# Patient Record
Sex: Female | Born: 1945
Health system: Southern US, Community
[De-identification: ages and names within clinical notes are randomized; demographics above are authoritative.]

## PROBLEM LIST (undated history)

## (undated) ENCOUNTER — Ambulatory Visit (HOSPITAL_COMMUNITY)

## (undated) DIAGNOSIS — Z8719 Personal history of other diseases of the digestive system: Secondary | ICD-10-CM

## (undated) DIAGNOSIS — K5790 Diverticulosis of intestine, part unspecified, without perforation or abscess without bleeding: Secondary | ICD-10-CM

## (undated) DIAGNOSIS — I341 Nonrheumatic mitral (valve) prolapse: Secondary | ICD-10-CM

## (undated) DIAGNOSIS — D126 Benign neoplasm of colon, unspecified: Secondary | ICD-10-CM

## (undated) DIAGNOSIS — I10 Essential (primary) hypertension: Secondary | ICD-10-CM

## (undated) DIAGNOSIS — C4442 Squamous cell carcinoma of skin of scalp and neck: Secondary | ICD-10-CM

## (undated) DIAGNOSIS — K219 Gastro-esophageal reflux disease without esophagitis: Secondary | ICD-10-CM

## (undated) DIAGNOSIS — E162 Hypoglycemia, unspecified: Secondary | ICD-10-CM

## (undated) DIAGNOSIS — J8489 Other specified interstitial pulmonary diseases: Secondary | ICD-10-CM

## (undated) DIAGNOSIS — Z982 Presence of cerebrospinal fluid drainage device: Secondary | ICD-10-CM

## (undated) DIAGNOSIS — IMO0001 Reserved for inherently not codable concepts without codable children: Secondary | ICD-10-CM

## (undated) DIAGNOSIS — M25551 Pain in right hip: Secondary | ICD-10-CM

## (undated) DIAGNOSIS — K449 Diaphragmatic hernia without obstruction or gangrene: Secondary | ICD-10-CM

## (undated) DIAGNOSIS — D47Z2 Castleman disease: Secondary | ICD-10-CM

## (undated) DIAGNOSIS — Z8679 Personal history of other diseases of the circulatory system: Secondary | ICD-10-CM

## (undated) DIAGNOSIS — K648 Other hemorrhoids: Secondary | ICD-10-CM

## (undated) DIAGNOSIS — I2510 Atherosclerotic heart disease of native coronary artery without angina pectoris: Secondary | ICD-10-CM

## (undated) DIAGNOSIS — G5601 Carpal tunnel syndrome, right upper limb: Secondary | ICD-10-CM

## (undated) DIAGNOSIS — I609 Nontraumatic subarachnoid hemorrhage, unspecified: Secondary | ICD-10-CM

## (undated) DIAGNOSIS — E785 Hyperlipidemia, unspecified: Secondary | ICD-10-CM

## (undated) DIAGNOSIS — R011 Cardiac murmur, unspecified: Secondary | ICD-10-CM

## (undated) DIAGNOSIS — K224 Dyskinesia of esophagus: Secondary | ICD-10-CM

## (undated) DIAGNOSIS — J9859 Other diseases of mediastinum, not elsewhere classified: Secondary | ICD-10-CM

## (undated) HISTORY — DX: Squamous cell carcinoma of skin of scalp and neck: C44.42

## (undated) HISTORY — DX: Benign neoplasm of colon, unspecified: D12.6

## (undated) HISTORY — DX: Diaphragmatic hernia without obstruction or gangrene: K44.9

## (undated) HISTORY — DX: Dyskinesia of esophagus: K22.4

## (undated) HISTORY — DX: Gastro-esophageal reflux disease without esophagitis: K21.9

## (undated) HISTORY — DX: Hyperlipidemia, unspecified: E78.5

## (undated) HISTORY — DX: Nonrheumatic mitral (valve) prolapse: I34.1

## (undated) HISTORY — DX: Other hemorrhoids: K64.8

## (undated) HISTORY — DX: Diverticulosis of intestine, part unspecified, without perforation or abscess without bleeding: K57.90

## (undated) HISTORY — DX: Essential (primary) hypertension: I10

## (undated) HISTORY — DX: Cardiac murmur, unspecified: R01.1

## (undated) HISTORY — DX: Hypoglycemia, unspecified: E16.2

## (undated) HISTORY — PX: EYE SURGERY: SHX253

---

## 1999-02-07 DIAGNOSIS — Z8711 Personal history of peptic ulcer disease: Secondary | ICD-10-CM

## 1999-02-07 HISTORY — DX: Personal history of peptic ulcer disease: Z87.11

## 1999-02-25 ENCOUNTER — Encounter: Admission: RE | Admit: 1999-02-25 | Discharge: 1999-05-26 | Payer: Self-pay | Admitting: Family Medicine

## 1999-11-23 ENCOUNTER — Other Ambulatory Visit: Admission: RE | Admit: 1999-11-23 | Discharge: 1999-11-23 | Payer: Self-pay | Admitting: Gastroenterology

## 2000-09-26 ENCOUNTER — Other Ambulatory Visit: Admission: RE | Admit: 2000-09-26 | Discharge: 2000-09-26 | Payer: Self-pay | Admitting: Obstetrics and Gynecology

## 2002-02-03 ENCOUNTER — Other Ambulatory Visit: Admission: RE | Admit: 2002-02-03 | Discharge: 2002-02-03 | Payer: Self-pay | Admitting: Obstetrics and Gynecology

## 2002-09-07 HISTORY — PX: OTHER SURGICAL HISTORY: SHX169

## 2002-09-18 ENCOUNTER — Encounter: Payer: Self-pay | Admitting: Emergency Medicine

## 2002-09-18 ENCOUNTER — Encounter: Payer: Self-pay | Admitting: Neurosurgery

## 2002-09-18 ENCOUNTER — Inpatient Hospital Stay (HOSPITAL_COMMUNITY): Admission: EM | Admit: 2002-09-18 | Discharge: 2002-10-14 | Payer: Self-pay | Admitting: Neurosurgery

## 2002-09-19 ENCOUNTER — Encounter: Payer: Self-pay | Admitting: Neurosurgery

## 2002-09-22 ENCOUNTER — Encounter: Payer: Self-pay | Admitting: Neurosurgery

## 2002-09-23 ENCOUNTER — Encounter: Payer: Self-pay | Admitting: Neurosurgery

## 2002-10-02 ENCOUNTER — Encounter: Payer: Self-pay | Admitting: Neurosurgery

## 2002-10-06 ENCOUNTER — Encounter: Payer: Self-pay | Admitting: Neurosurgery

## 2002-10-07 ENCOUNTER — Encounter: Payer: Self-pay | Admitting: Neurological Surgery

## 2002-10-08 ENCOUNTER — Encounter: Payer: Self-pay | Admitting: Neurosurgery

## 2002-10-08 HISTORY — PX: VENTRICULOPERITONEAL SHUNT: SHX204

## 2002-10-10 ENCOUNTER — Encounter: Payer: Self-pay | Admitting: Neurosurgery

## 2002-10-12 ENCOUNTER — Encounter: Payer: Self-pay | Admitting: Neurosurgery

## 2002-11-07 ENCOUNTER — Ambulatory Visit (HOSPITAL_COMMUNITY): Admission: RE | Admit: 2002-11-07 | Discharge: 2002-11-07 | Payer: Self-pay | Admitting: Neurosurgery

## 2002-11-07 ENCOUNTER — Encounter: Payer: Self-pay | Admitting: Neurosurgery

## 2002-11-20 ENCOUNTER — Encounter: Payer: Self-pay | Admitting: Neurosurgery

## 2002-11-20 ENCOUNTER — Ambulatory Visit (HOSPITAL_COMMUNITY): Admission: RE | Admit: 2002-11-20 | Discharge: 2002-11-20 | Payer: Self-pay | Admitting: Neurosurgery

## 2002-12-26 ENCOUNTER — Ambulatory Visit (HOSPITAL_COMMUNITY): Admission: RE | Admit: 2002-12-26 | Discharge: 2002-12-26 | Payer: Self-pay | Admitting: Neurosurgery

## 2003-07-03 ENCOUNTER — Ambulatory Visit (HOSPITAL_COMMUNITY): Admission: RE | Admit: 2003-07-03 | Discharge: 2003-07-03 | Payer: Self-pay | Admitting: Neurosurgery

## 2003-09-10 ENCOUNTER — Other Ambulatory Visit: Admission: RE | Admit: 2003-09-10 | Discharge: 2003-09-10 | Payer: Self-pay | Admitting: Obstetrics and Gynecology

## 2004-10-06 ENCOUNTER — Other Ambulatory Visit: Admission: RE | Admit: 2004-10-06 | Discharge: 2004-10-06 | Payer: Self-pay | Admitting: Obstetrics and Gynecology

## 2005-01-02 ENCOUNTER — Ambulatory Visit: Payer: Self-pay | Admitting: Gastroenterology

## 2005-01-16 ENCOUNTER — Ambulatory Visit: Payer: Self-pay | Admitting: Gastroenterology

## 2005-10-11 ENCOUNTER — Other Ambulatory Visit: Admission: RE | Admit: 2005-10-11 | Discharge: 2005-10-11 | Payer: Self-pay | Admitting: Obstetrics and Gynecology

## 2010-02-06 HISTORY — PX: CATARACT EXTRACTION W/ INTRAOCULAR LENS  IMPLANT, BILATERAL: SHX1307

## 2010-02-22 ENCOUNTER — Encounter: Payer: Self-pay | Admitting: Gastroenterology

## 2010-03-10 NOTE — Letter (Signed)
Summary: Colonoscopy Letter  [facility]  [address]   [place], [place] [address]   Phone: [phone]  Fax: [phone]      February 22, 2010 MRN: [id number]   [facility] [address] [place], [place]  [address]   Dear Ms. Sosa,   According to your medical record, it is time for you to schedule a Colonoscopy. The American Cancer Society recommends this procedure as a method to detect early colon cancer. Patients with a family history of colon cancer, or a personal history of colon polyps or inflammatory bowel disease are at increased risk.  This letter has been generated based on the recommendations made at the time of your procedure. If you feel that in your particular situation this may no longer apply, please contact our office.  Please call our office at [phone] to schedule this appointment or to update your records at your earliest convenience.  Thank you for cooperating with [place] to provide you with the very best care possible.   Sincerely,   Vania Rea. Jarold Motto, M.D.  [facility] Gastroenterology Division [phone]

## 2010-05-04 ENCOUNTER — Encounter (INDEPENDENT_AMBULATORY_CARE_PROVIDER_SITE_OTHER): Payer: Self-pay | Admitting: *Deleted

## 2010-05-10 NOTE — Letter (Signed)
Summary: Pre Visit Letter Revised  [facility]  [address] [place], [place] [address]   Phone: [phone]  Fax: [phone]        05/04/2010 MRN: [id number] [facility] [address] [place], [place]  [address]             Procedure Date:  06-27-10           Recall Colon---Dr. Jarold Motto   Welcome to the Gastroenterology Division at [facility].    You are scheduled to see a nurse for your pre-procedure visit on 06-13-10 at 10:00a.m. on the 3rd floor at [facility], [address]  We ask that you try to arrive at our office 15 minutes prior to your appointment time to allow for check-in.  Please take a minute to review the attached form.  If you answer "Yes" to one or more of the questions on the first page, we ask that you call the person listed at your earliest opportunity.  If you answer "No" to all of the questions, please complete the rest of the form and bring it to your appointment.    Your nurse visit will consist of discussing your medical and surgical history, your immediate family medical history, and your medications.   If you are unable to list all of your medications on the form, please bring the medication bottles to your appointment and we will list them.  We will need to be aware of both prescribed and over the counter drugs.  We will need to know exact dosage information as well.    Please be prepared to read and sign documents such as consent forms, a financial agreement, and acknowledgement forms.  If necessary, and with your consent, a friend or relative is welcome to sit-in on the nurse visit with you.  Please bring your insurance card so that we may make a copy of it.  If your insurance requires a referral to see a specialist, please bring your referral form from your primary care physician.  No co-pay is required for this nurse visit.     If you cannot keep your appointment, please call [phone] to cancel or reschedule prior  to your appointment date.  This allows [place] the opportunity to schedule an appointment for another patient in need of care.    Thank you for choosing [facility] for your medical needs.  We appreciate the opportunity to care for you.  Please visit [place] at our website  to learn more about our practice.  Sincerely, The Gastroenterology Division

## 2010-06-13 ENCOUNTER — Encounter: Payer: Self-pay | Admitting: Gastroenterology

## 2010-06-13 ENCOUNTER — Ambulatory Visit (AMBULATORY_SURGERY_CENTER): Payer: Medicare Other | Admitting: *Deleted

## 2010-06-13 VITALS — Ht 65.0 in | Wt 193.8 lb

## 2010-06-13 DIAGNOSIS — Z800 Family history of malignant neoplasm of digestive organs: Secondary | ICD-10-CM

## 2010-06-13 MED ORDER — PEG-KCL-NACL-NASULF-NA ASC-C 100 G PO SOLR: ORAL | Status: DC

## 2010-06-20 ENCOUNTER — Other Ambulatory Visit: Payer: Self-pay | Admitting: Gastroenterology

## 2010-06-27 ENCOUNTER — Ambulatory Visit (AMBULATORY_SURGERY_CENTER): Payer: Medicare Other | Admitting: Gastroenterology

## 2010-06-27 ENCOUNTER — Encounter: Payer: Self-pay | Admitting: Gastroenterology

## 2010-06-27 ENCOUNTER — Encounter: Payer: Self-pay | Admitting: *Deleted

## 2010-06-27 VITALS — BP 147/74 | HR 60 | Temp 99.2°F | Resp 17 | Ht 65.0 in | Wt 193.0 lb

## 2010-06-27 DIAGNOSIS — Z800 Family history of malignant neoplasm of digestive organs: Secondary | ICD-10-CM

## 2010-06-27 DIAGNOSIS — Z1211 Encounter for screening for malignant neoplasm of colon: Secondary | ICD-10-CM

## 2010-06-27 MED ORDER — SODIUM CHLORIDE 0.9 % IV SOLN: 500.0000 mL | INTRAVENOUS | Status: DC

## 2010-06-27 NOTE — Patient Instructions (Signed)
Discharged instructions given with verbal understanding. Normal examination. Resume previous medications. 

## 2010-06-28 ENCOUNTER — Telehealth: Payer: Self-pay

## 2010-06-28 NOTE — Telephone Encounter (Signed)

## 2011-03-10 HISTORY — PX: RETINAL DETACHMENT REPAIR W/ SCLERAL BUCKLE LE: SHX2338

## 2011-07-10 ENCOUNTER — Other Ambulatory Visit: Payer: Self-pay | Admitting: Orthopedic Surgery

## 2011-07-19 ENCOUNTER — Encounter (HOSPITAL_BASED_OUTPATIENT_CLINIC_OR_DEPARTMENT_OTHER): Payer: Self-pay | Admitting: *Deleted

## 2011-07-20 ENCOUNTER — Encounter (HOSPITAL_BASED_OUTPATIENT_CLINIC_OR_DEPARTMENT_OTHER): Payer: Self-pay | Admitting: *Deleted

## 2011-07-20 NOTE — Progress Notes (Signed)
NPO AFTER MN. ARRIVES AT 0715. NEEDS ISTAT AND EKG. WILL TAKE AM MEDS W/ EXCEPTION NO HCTZ, W/ SIPS OF WATER.

## 2011-07-20 NOTE — Progress Notes (Signed)
NPO AFTER MN. ARRIVES AT 0715. NEEDS ISTAT AND EKG. WILL TAKE AM MEDS WITH EXCEPTION NO HCTZ, W/ SIP OF WATER.

## 2011-07-25 NOTE — Anesthesia Preprocedure Evaluation (Addendum)
Anesthesia Evaluation  Patient identified by MRN, date of birth, ID band Patient awake  General Assessment Comment:Hypertension   Hyperlipidemia      GERD (gastroesophageal reflux disease)   Hypoglycemia      Heart murmur   MVP (mitral valve prolapse)      Diverticulosis   History of gastric ulcer 2001    Carpal tunnel syndrome of right wrist   H/O hiatal hernia      S/P ventriculoperitoneal shunt 2004--- PER PT SHUNT IS WORKING WELL --  NO ISSUES Normal echocardiogram 30 YRS AGO    Hip pain, right OCCASIONAL -- PINCHED NERVE H/O intraventricular hemorrhage 2004--     Reviewed: Allergy & Precautions, H&P , NPO status , Patient's Chart, lab work & pertinent test results  Airway Mallampati: II TM Distance: >3 FB Neck ROM: Full    Dental No notable dental hx.    Pulmonary neg pulmonary ROS,  breath sounds clear to auscultation  Pulmonary exam normal       Cardiovascular hypertension, Pt. on home beta blockers + Valvular Problems/Murmurs Rhythm:Regular Rate:Normal     Neuro/Psych  Neuromuscular disease negative neurological ROS  negative psych ROS   GI/Hepatic Neg liver ROS, hiatal hernia, GERD-  Medicated,  Endo/Other  negative endocrine ROS  Renal/GU negative Renal ROS  negative genitourinary   Musculoskeletal negative musculoskeletal ROS (+)   Abdominal (+) + obese,   Peds negative pediatric ROS (+)  Hematology negative hematology ROS (+)   Anesthesia Other Findings   Reproductive/Obstetrics negative OB ROS                          Anesthesia Physical Anesthesia Plan  ASA: II  Anesthesia Plan: General   Post-op Pain Management:    Induction: Intravenous  Airway Management Planned: LMA  Additional Equipment:   Intra-op Plan:   Post-operative Plan: Extubation in OR  Informed Consent: I have reviewed the patients History and Physical, chart, labs and discussed the procedure  including the risks, benefits and alternatives for the proposed anesthesia with the patient or authorized representative who has indicated his/her understanding and acceptance.   Dental advisory given  Plan Discussed with: CRNA  Anesthesia Plan Comments:         Anesthesia Quick Evaluation

## 2011-07-26 ENCOUNTER — Encounter (HOSPITAL_BASED_OUTPATIENT_CLINIC_OR_DEPARTMENT_OTHER): Payer: Self-pay | Admitting: *Deleted

## 2011-07-26 ENCOUNTER — Encounter (HOSPITAL_BASED_OUTPATIENT_CLINIC_OR_DEPARTMENT_OTHER)
Admission: RE | Disposition: A | Discharge status: Home / Self Care | Payer: Self-pay | Source: Ambulatory Visit | Attending: Orthopedic Surgery

## 2011-07-26 ENCOUNTER — Ambulatory Visit (HOSPITAL_BASED_OUTPATIENT_CLINIC_OR_DEPARTMENT_OTHER)
Admission: RE | Admit: 2011-07-26 | Discharge: 2011-07-26 | Disposition: A | Discharge status: Home / Self Care | Payer: Medicare Other | Source: Ambulatory Visit | Attending: Orthopedic Surgery | Admitting: Orthopedic Surgery

## 2011-07-26 ENCOUNTER — Encounter (HOSPITAL_BASED_OUTPATIENT_CLINIC_OR_DEPARTMENT_OTHER): Payer: Self-pay | Admitting: Anesthesiology

## 2011-07-26 ENCOUNTER — Ambulatory Visit (HOSPITAL_BASED_OUTPATIENT_CLINIC_OR_DEPARTMENT_OTHER): Payer: Medicare Other | Admitting: Anesthesiology

## 2011-07-26 DIAGNOSIS — M720 Palmar fascial fibromatosis [Dupuytren]: Secondary | ICD-10-CM | POA: Insufficient documentation

## 2011-07-26 DIAGNOSIS — Z9889 Other specified postprocedural states: Secondary | ICD-10-CM

## 2011-07-26 DIAGNOSIS — K219 Gastro-esophageal reflux disease without esophagitis: Secondary | ICD-10-CM | POA: Insufficient documentation

## 2011-07-26 DIAGNOSIS — Z79899 Other long term (current) drug therapy: Secondary | ICD-10-CM | POA: Insufficient documentation

## 2011-07-26 DIAGNOSIS — G5600 Carpal tunnel syndrome, unspecified upper limb: Secondary | ICD-10-CM | POA: Insufficient documentation

## 2011-07-26 DIAGNOSIS — E785 Hyperlipidemia, unspecified: Secondary | ICD-10-CM | POA: Insufficient documentation

## 2011-07-26 DIAGNOSIS — I10 Essential (primary) hypertension: Secondary | ICD-10-CM | POA: Insufficient documentation

## 2011-07-26 HISTORY — PX: DUPUYTREN CONTRACTURE RELEASE: SHX1478

## 2011-07-26 HISTORY — DX: Personal history of other diseases of the digestive system: Z87.19

## 2011-07-26 HISTORY — PX: CARPAL TUNNEL RELEASE: SHX101

## 2011-07-26 HISTORY — DX: Personal history of other diseases of the circulatory system: Z86.79

## 2011-07-26 HISTORY — DX: Carpal tunnel syndrome, right upper limb: G56.01

## 2011-07-26 HISTORY — DX: Reserved for inherently not codable concepts without codable children: IMO0001

## 2011-07-26 HISTORY — DX: Presence of cerebrospinal fluid drainage device: Z98.2

## 2011-07-26 HISTORY — DX: Pain in right hip: M25.551

## 2011-07-26 LAB — POCT I-STAT 4, (NA,K, GLUC, HGB,HCT)
Glucose, Bld: 108 mg/dL — ABNORMAL HIGH (ref 70–99)
HCT: 61 % — ABNORMAL HIGH (ref 36.0–46.0)
Hemoglobin: 20.7 g/dL — ABNORMAL HIGH (ref 12.0–15.0)
Potassium: 3.7 mEq/L (ref 3.5–5.1)
Sodium: 146 mEq/L — ABNORMAL HIGH (ref 135–145)

## 2011-07-26 SURGERY — CARPAL TUNNEL RELEASE
Anesthesia: General | Site: Hand | Laterality: Right | Wound class: Clean

## 2011-07-26 MED ORDER — LIDOCAINE HCL (CARDIAC) 20 MG/ML IV SOLN
INTRAVENOUS | Status: DC | PRN
  Administered 2011-07-26: 70 mg via INTRAVENOUS

## 2011-07-26 MED ORDER — HYDROCODONE-ACETAMINOPHEN 5-325 MG PO TABS
1.0000 | ORAL_TABLET | Freq: Four times a day (QID) | ORAL | Status: DC | PRN
  Administered 2011-07-26: 1 via ORAL

## 2011-07-26 MED ORDER — METOCLOPRAMIDE HCL 5 MG/ML IJ SOLN
INTRAMUSCULAR | Status: DC | PRN
  Administered 2011-07-26: 10 mg via INTRAVENOUS

## 2011-07-26 MED ORDER — FENTANYL CITRATE 0.05 MG/ML IJ SOLN
INTRAMUSCULAR | Status: DC | PRN
  Administered 2011-07-26 (×2): 25 ug via INTRAVENOUS
  Administered 2011-07-26 (×2): 50 ug via INTRAVENOUS
  Administered 2011-07-26 (×2): 25 ug via INTRAVENOUS

## 2011-07-26 MED ORDER — ONDANSETRON HCL 4 MG/2ML IJ SOLN
INTRAMUSCULAR | Status: DC | PRN
  Administered 2011-07-26: 4 mg via INTRAVENOUS

## 2011-07-26 MED ORDER — KETOROLAC TROMETHAMINE 30 MG/ML IJ SOLN
INTRAMUSCULAR | Status: DC | PRN
  Administered 2011-07-26: 15 mg via INTRAVENOUS

## 2011-07-26 MED ORDER — MIDAZOLAM HCL 5 MG/5ML IJ SOLN
INTRAMUSCULAR | Status: DC | PRN
  Administered 2011-07-26: 2 mg via INTRAVENOUS

## 2011-07-26 MED ORDER — PROPOFOL 10 MG/ML IV EMUL
INTRAVENOUS | Status: DC | PRN
  Administered 2011-07-26: 150 mg via INTRAVENOUS
  Administered 2011-07-26: 30 mg via INTRAVENOUS

## 2011-07-26 MED ORDER — LACTATED RINGERS IV SOLN
INTRAVENOUS | Status: DC
  Administered 2011-07-26 (×2): via INTRAVENOUS

## 2011-07-26 MED ORDER — PROMETHAZINE HCL 25 MG/ML IJ SOLN: 6.2500 mg | INTRAMUSCULAR | Status: DC | PRN

## 2011-07-26 MED ORDER — HYDROCODONE-ACETAMINOPHEN 5-325 MG PO TABS: 1.0000 | ORAL_TABLET | Freq: Four times a day (QID) | ORAL | Status: AC | PRN

## 2011-07-26 MED ORDER — FENTANYL CITRATE 0.05 MG/ML IJ SOLN
25.0000 ug | INTRAMUSCULAR | Status: DC | PRN
  Administered 2011-07-26 (×2): 25 ug via INTRAVENOUS

## 2011-07-26 MED ORDER — POVIDONE-IODINE 7.5 % EX SOLN: Freq: Once | CUTANEOUS | Status: DC

## 2011-07-26 SURGICAL SUPPLY — 61 items
BANDAGE COBAN STERILE 2 (GAUZE/BANDAGES/DRESSINGS) IMPLANT
BANDAGE CONFORM 2  STR LF (GAUZE/BANDAGES/DRESSINGS) IMPLANT
BANDAGE CONFORM 3  STR LF (GAUZE/BANDAGES/DRESSINGS) ×2 IMPLANT
BANDAGE ELASTIC 3 VELCRO ST LF (GAUZE/BANDAGES/DRESSINGS) IMPLANT
BANDAGE ELASTIC 4 VELCRO ST LF (GAUZE/BANDAGES/DRESSINGS) ×1 IMPLANT
BLADE SURG 15 STRL LF DISP TIS (BLADE) ×1 IMPLANT
BLADE SURG 15 STRL SS (BLADE) ×2
BNDG CMPR 9X4 STRL LF SNTH (GAUZE/BANDAGES/DRESSINGS) ×1
BNDG ESMARK 4X9 LF (GAUZE/BANDAGES/DRESSINGS) ×2 IMPLANT
CLOTH BEACON ORANGE TIMEOUT ST (SAFETY) ×2 IMPLANT
CORDS BIPOLAR (ELECTRODE) ×2 IMPLANT
COVER TABLE BACK 60X90 (DRAPES) ×2 IMPLANT
DRAPE EXTREMITY T 121X128X90 (DRAPE) ×2 IMPLANT
DRAPE LG THREE QUARTER DISP (DRAPES) ×4 IMPLANT
DRAPE U-SHAPE 47X51 STRL (DRAPES) ×2 IMPLANT
DRSG EMULSION OIL 3X3 NADH (GAUZE/BANDAGES/DRESSINGS) ×2 IMPLANT
DURAPREP 26ML APPLICATOR (WOUND CARE) ×2 IMPLANT
ELECT REM PT RETURN 9FT ADLT (ELECTROSURGICAL) ×2
ELECTRODE REM PT RTRN 9FT ADLT (ELECTROSURGICAL) IMPLANT
GLOVE BIOGEL PI IND STRL 8 (GLOVE) ×1 IMPLANT
GLOVE BIOGEL PI INDICATOR 8 (GLOVE) ×1
GLOVE ECLIPSE 6.5 STRL STRAW (GLOVE) ×1 IMPLANT
GLOVE ECLIPSE 8.0 STRL XLNG CF (GLOVE) ×2 IMPLANT
GLOVE INDICATOR 6.5 STRL GRN (GLOVE) ×1 IMPLANT
GLOVE INDICATOR 8.0 STRL GRN (GLOVE) ×4 IMPLANT
GOWN PREVENTION PLUS LG XLONG (DISPOSABLE) ×2 IMPLANT
GOWN STRL REIN XL XLG (GOWN DISPOSABLE) ×2 IMPLANT
GOWN W/COTTON TOWEL STD LRG (GOWNS) ×3 IMPLANT
GOWN XL W/COTTON TOWEL STD (GOWNS) ×2 IMPLANT
MARKER SKIN DUAL TIP RULER LAB (MISCELLANEOUS) ×2 IMPLANT
NDL HYPO 25X1 1.5 SAFETY (NEEDLE) IMPLANT
NEEDLE 27GAX1X1/2 (NEEDLE) IMPLANT
NEEDLE HYPO 22GX1.5 SAFETY (NEEDLE) IMPLANT
NEEDLE HYPO 25X1 1.5 SAFETY (NEEDLE) IMPLANT
NS IRRIG 500ML POUR BTL (IV SOLUTION) ×3 IMPLANT
PACK BASIN DAY SURGERY FS (CUSTOM PROCEDURE TRAY) ×2 IMPLANT
PAD CAST 3X4 CTTN HI CHSV (CAST SUPPLIES) IMPLANT
PAD CAST 4YDX4 CTTN HI CHSV (CAST SUPPLIES) IMPLANT
PADDING CAST ABS 4INX4YD NS (CAST SUPPLIES) ×1
PADDING CAST ABS COTTON 4X4 ST (CAST SUPPLIES) ×1 IMPLANT
PADDING CAST COTTON 3X4 STRL (CAST SUPPLIES)
PADDING CAST COTTON 4X4 STRL (CAST SUPPLIES)
PENCIL BUTTON HOLSTER BLD 10FT (ELECTRODE) IMPLANT
SPLINT PLASTER CAST XFAST 3X15 (CAST SUPPLIES) IMPLANT
SPLINT PLASTER XTRA FASTSET 3X (CAST SUPPLIES) ×1
SPONGE GAUZE 2X2 8PLY STRL LF (GAUZE/BANDAGES/DRESSINGS) IMPLANT
SPONGE GAUZE 4X4 12PLY (GAUZE/BANDAGES/DRESSINGS) ×2 IMPLANT
STOCKINETTE 4X48 STRL (DRAPES) ×2 IMPLANT
SUT ETHILON 4 0 PS 2 18 (SUTURE) ×6 IMPLANT
SUT ETHILON 5 0 PS 2 18 (SUTURE) IMPLANT
SUT VIC AB 2-0 SH 27 (SUTURE)
SUT VIC AB 2-0 SH 27XBRD (SUTURE) IMPLANT
SUT VIC AB 2-0 UR6 27 (SUTURE) IMPLANT
SUT VIC AB 4-0 SH 27 (SUTURE)
SUT VIC AB 4-0 SH 27XANBCTRL (SUTURE) IMPLANT
SYR BULB 3OZ (MISCELLANEOUS) ×2 IMPLANT
SYR CONTROL 10ML LL (SYRINGE) IMPLANT
TOWEL NATURAL 6PK STERILE (DISPOSABLE) ×1 IMPLANT
TOWEL OR 17X24 6PK STRL BLUE (TOWEL DISPOSABLE) ×4 IMPLANT
UNDERPAD 30X30 INCONTINENT (UNDERPADS AND DIAPERS) ×2 IMPLANT
WATER STERILE IRR 500ML POUR (IV SOLUTION) ×1 IMPLANT

## 2011-07-26 NOTE — H&P (Signed)
Alison Ayala is an 66 y.o. female.   Chief Complaint:pain and numbness rt hand HPI: progressive contracture thumb/index web space due to Dupuytren's band and median nerve compression on NCV's  Past Medical History  Diagnosis Date  . Hypertension   . Hyperlipidemia   . GERD (gastroesophageal reflux disease)   . Hypoglycemia   . Heart murmur   . MVP (mitral valve prolapse)   . Diverticulosis   . History of gastric ulcer 2001  . Carpal tunnel syndrome of right wrist   . H/O hiatal hernia   . S/P ventriculoperitoneal shunt 2004--- PER PT SHUNT IS WORKING WELL --  NO ISSUES  . Normal echocardiogram 30 YRS AGO  . Hip pain, right OCCASIONAL -- PINCHED NERVE  . H/O intraventricular hemorrhage 2004--  SPONTANEOUS    S/P SHUNT    Past Surgical History  Procedure Date  . Ventriculoperitoneal shunt 10-08-2002    RIGHT FRONTAL FOR COMMUNICATING HYDROCEPHALUS  . Bilateral frontal interventricular catheter via twist drill hole AUG 2004    SPONTANEOUS SUBARACHNOID  HEMORRHAGE AND  INTRAVENTRICULAR HEMORRHAGE  . Cataract extraction w/ intraocular lens  implant, bilateral 2012    BILATERAL  . Retinal detachment repair w/ scleral buckle le 03-10-2011    LEFT EYE    Family History  Problem Relation Age of Onset  . Colon cancer Mother 64  . Stomach cancer Maternal Aunt   . Breast cancer Maternal Aunt   . Uterine cancer Maternal Aunt    Social History:  reports that she has never smoked. She has never used smokeless tobacco. She reports that she drinks alcohol. She reports that she does not use illicit drugs.  Allergies: No Known Allergies  Medications Prior to Admission  Medication Sig Dispense Refill  . atenolol (TENORMIN) 100 MG tablet Take 100 mg by mouth every morning.       . Cholecalciferol (VITAMIN D) 1000 UNITS capsule Take 1,000 Units by mouth every morning.       . hydrochlorothiazide 25 MG tablet Take 25 mg by mouth every morning. Takes 1/2 tablet,may take 1 tablet if  needed      . ibuprofen (ADVIL,MOTRIN) 200 MG tablet Take 200 mg by mouth every 6 (six) hours as needed.      . pravastatin (PRAVACHOL) 40 MG tablet Take 80 mg by mouth every morning.       . ranitidine (ZANTAC) 150 MG tablet Take 150 mg by mouth every morning.        No results found for this or any previous visit (from the past 48 hour(s)). No results found.  ROS  Blood pressure 143/70, pulse 54, temperature 97.4 F (36.3 C), temperature source Oral, resp. rate 18, height 5\' 5"  (1.651 m), weight 88.451 kg (195 lb), SpO2 95.00%. Physical Exam  Constitutional: She is oriented to person, place, and time. She appears well-developed and well-nourished.  HENT:  Head: Normocephalic and atraumatic.  Right Ear: External ear normal.  Left Ear: External ear normal.  Nose: Nose normal.  Mouth/Throat: Oropharynx is clear and moist.  Eyes: Conjunctivae and EOM are normal. Pupils are equal, round, and reactive to light.  Neck: Normal range of motion. Neck supple.  Cardiovascular: Normal rate, regular rhythm, normal heart sounds and intact distal pulses.   Respiratory: Effort normal and breath sounds normal.  GI: Soft. Bowel sounds are normal.  Musculoskeletal: Normal range of motion.  Neurological: She is alert and oriented to person, place, and time. She has normal reflexes.  Skin: Skin is  warm and dry.  Psychiatric: She has a normal mood and affect. Her behavior is normal. Judgment and thought content normal.     Assessment/Plan 1Dupuytren's band thumb index web space  2.carpal tunnel syndrome  Rt hand Correction of both    Alison Ayala P 07/26/2011, 8:17 AM

## 2011-07-26 NOTE — Discharge Instructions (Addendum)
  Post Anesthesia Home Care Instructions  Activity: Get plenty of rest for the remainder of the day. A responsible adult should stay with you for 24 hours following the procedure.  For the next 24 hours, DO NOT: -Drive a car -Advertising copywriter -Drink alcoholic beverages -Take any medication unless instructed by your physician -Make any legal decisions or sign important papers.  Meals: Start with liquid foods such as gelatin or soup. Progress to regular foods as tolerated. Avoid greasy, spicy, heavy foods. If nausea and/or vomiting occur, drink only clear liquids until the nausea and/or vomiting subsides. Call your physician if vomiting continues.  Special Instructions/Symptoms: Your throat may feel dry or sore from the anesthesia or the breathing tube placed in your throat during surgery. If this causes discomfort, gargle with warm salt water. The discomfort should disappear within 24 hours.        HAND SURGERY    HOME CARE INSTRUCTIONS    The following instructions have been prepared to help you care for yourself upon your return home today.  Wound Care:  Keep your hand elevated above the level of your heart. Do not allow it to dangle by your side. Keep the dressing dry and do not remove it unless your doctor advises you to do so. He will usually change it at the time of you post-op visit. Moving your fingers is advised to stimulate circulation but will depend on the site of your surgery. Of course, if you have a splint applied your doctor will advise you about movement.  Activity:  Do not drive or operate machinery today. Rest today and then you may return to your normal activity and work as indicated by your physician.  Diet: Drink liquids today or eat a light diet. You may resume a regular diet tomorrow.  General expectations: Pain for two or three days. Fingers may become slightly swollen.   Unexpected Observations- Call your doctor if any of these occur: Severe pain not  relieved by pain medication. Elevated temperature. Dressing soaked with blood. Inability to move fingers. White or bluish color to fingers.  Return to office: ***

## 2011-07-26 NOTE — Transfer of Care (Signed)
Immediate Anesthesia Transfer of Care Note  Patient: Alison Ayala  Procedure(s) Performed: Procedure(s) (LRB): CARPAL TUNNEL RELEASE (Right) DUPUYTREN CONTRACTURE RELEASE (Right)  Patient Location: PACU  Anesthesia Type: General  Level of Consciousness: awake, alert  and oriented  Airway & Oxygen Therapy: Patient Spontanous Breathing and Patient connected to face mask oxygen  Post-op Assessment: Report given to PACU RN and Post -op Vital signs reviewed and stable  Post vital signs: Reviewed and stable  Complications: No apparent anesthesia complications

## 2011-07-26 NOTE — Anesthesia Procedure Notes (Signed)
Procedure Name: LMA Insertion Date/Time: 07/26/2011 8:33 AM Performed by: Norva Pavlov Pre-anesthesia Checklist: Patient identified, Emergency Drugs available, Suction available and Patient being monitored Patient Re-evaluated:Patient Re-evaluated prior to inductionOxygen Delivery Method: Circle System Utilized Preoxygenation: Pre-oxygenation with 100% oxygen Intubation Type: IV induction Ventilation: Mask ventilation without difficulty LMA: LMA with gastric port inserted LMA Size: 4.0 Number of attempts: 1 Placement Confirmation: positive ETCO2 Tube secured with: Tape Dental Injury: Teeth and Oropharynx as per pre-operative assessment

## 2011-07-26 NOTE — Brief Op Note (Signed)
07/26/2011  1:54 PM  PATIENT:  Cathey Endow  66 y.o. female  PRE-OPERATIVE DIAGNOSIS:  RIGHT HAND CARPAL TUNELL SYNDROME , RIGHT THUMB AND INDEX FINGER DEPUTYRENS  POST-OPERATIVE DIAGNOSIS:  RIGHT HAND CARPAL TUNELL SYNDROME , RIGHT THUMB  PROCEDURE:  Procedure(s) (LRB): CARPAL TUNNEL RELEASE (Right) DUPUYTREN CONTRACTURE RELEASE (Right)  SURGEON:  Surgeon(s) and Role:    * Drucilla Schmidt, MD - Primary  PHYSICIAN ASSISTANT:   ASSISTANTS: nurse  ANESTHESIA:   general  EBL:  Total I/O In: 2180 [P.O.:480; I.V.:1700] Out: -   BLOOD ADMINISTERED:none  DRAINS: none   LOCAL MEDICATIONS USED:  NONE  SPECIMEN:  No Specimen  DISPOSITION OF SPECIMEN:  N/A  COUNTS:  YES  TOURNIQUET:   Total Tourniquet Time Documented: Upper Arm (Right) - 67 minutes  DICTATION: .Other Dictation: Dictation Number [id number]  PLAN OF CARE: Discharge to home after PACU  PATIENT DISPOSITION:  PACU - hemodynamically stable.   Delay start of Pharmacological VTE agent (>24hrs) due to surgical blood loss or risk of bleeding: yes And index

## 2011-07-26 NOTE — Anesthesia Postprocedure Evaluation (Signed)
  Anesthesia Post-op Note  Patient: Alison Ayala  Procedure(s) Performed: Procedure(s) (LRB): CARPAL TUNNEL RELEASE (Right) DUPUYTREN CONTRACTURE RELEASE (Right)  Patient Location: PACU  Anesthesia Type: General  Level of Consciousness: awake and alert   Airway and Oxygen Therapy: Patient Spontanous Breathing  Post-op Pain: mild  Post-op Assessment: Post-op Vital signs reviewed, Patient's Cardiovascular Status Stable, Respiratory Function Stable, Patent Airway and No signs of Nausea or vomiting  Post-op Vital Signs: stable  Complications: No apparent anesthesia complications

## 2011-07-27 ENCOUNTER — Encounter (HOSPITAL_BASED_OUTPATIENT_CLINIC_OR_DEPARTMENT_OTHER): Payer: Self-pay | Admitting: Orthopedic Surgery

## 2011-07-27 NOTE — Op Note (Signed)
NAMECHELLSIE, GOMER             ACCOUNT NO.:  [id number]  MEDICAL RECORD NO.:  [id number]  LOCATION:                               FACILITY:  [facility]  PHYSICIAN:  Marlowe Kays, M.D.  DATE OF BIRTH:  March 30, 1945  DATE OF PROCEDURE:  07/26/2011 DATE OF DISCHARGE:                              OPERATIVE REPORT   PREOPERATIVE DIAGNOSES: 1. Right upper extremity carpal tunnel syndrome. 2. Painful Dupuytren's band from index thumb web space  POSTOPERATIVE DIAGNOSES: The Same OPERATION: 1. Decompression of median nerve, right wrist and hand. 2. Excision of Dupuytren's band, thumb-index webspace with modified Z     plasty.  SURGEON:  Marlowe Kays, M.D.  ASSISTANT:  Nurse.  ANESTHESIA:  General.  PATHOLOGY AND JUSTIFICATION FOR PROCEDURE:  She had_thumb-index restriction________  because her webspace was tight to her thumb and index finger negatively affecting her hand function.  She also had numbness of the median nerve distribution in hand with nerve conduction studies confirmed a carpal tunnel syndrome.  PROCEDURE:  Satisfied general anesthesia, pneumatic tourniquet to right upper extremity with the arm Esmarched out nonsterilely and tourniquet inflated to 250 mmHg.  Time-out performed.  The right upper extremity from midforearm to fingertips was prepped with DuraPrep and draped in sterile field.  I marked out the carpal tunnel incision, and then marked out the Z-plasty type incision involving the thumb-index webspace trying to give [place] much room between this incision and the carpal tunnel incision as possible.  I first performed the carpal tunnel release at the wrist making decisions and she had no palmaris longus tendon.  The median nerve was identified and protecting it.  I have released the skin, subcutaneous tissue, and fascia into the distal palm.  The wound was then irrigated with sterile saline.  Skin and subcutaneous tissue only closed with 4-0 nylon mattress  sutures.  I then opened the Z-plasty incision, taking great care to both visualize and not injure the radial digital nerve to the index finger and the 2 digital nerves to the thumb. The Dupuytren's band was excised, and this allowed thumb-index web space to open nicely.  I then went to reconstruct the Z-plasty and found that because of the location, the flaps did not respond well to a typical Z- plasty and accordingly I used a V-Y plasty closing the apices of the Z- plasty for about 7 mm, which again is 1.4 cm of additional tissue length.  The 2 flaps were closed without any tension with 4-0 nylon mattress sutures with the apical sutures at the apices of the Z-plasty flaps.  Betadine, Adaptic, dry sterile dressing and a pressure-type dressing for the hand webspace with volar plaster splint applied.  Tourniquet was released. She tolerated the procedure well, was taken to recovery room in satisfactory condition with no known complications.          ______________________________ Marlowe Kays, M.D.     JA/MEDQ  D:  07/26/2011  T:  07/27/2011  Job:  [id number]

## 2012-05-31 ENCOUNTER — Other Ambulatory Visit: Payer: Self-pay | Admitting: Dermatology

## 2013-09-12 ENCOUNTER — Encounter: Payer: Self-pay | Admitting: Gastroenterology

## 2014-04-08 DIAGNOSIS — E559 Vitamin D deficiency, unspecified: Secondary | ICD-10-CM | POA: Diagnosis not present

## 2014-04-08 DIAGNOSIS — E785 Hyperlipidemia, unspecified: Secondary | ICD-10-CM | POA: Diagnosis not present

## 2014-04-08 DIAGNOSIS — R7301 Impaired fasting glucose: Secondary | ICD-10-CM | POA: Diagnosis not present

## 2014-04-08 DIAGNOSIS — F325 Major depressive disorder, single episode, in full remission: Secondary | ICD-10-CM | POA: Diagnosis not present

## 2014-04-08 DIAGNOSIS — I10 Essential (primary) hypertension: Secondary | ICD-10-CM | POA: Diagnosis not present

## 2014-04-08 DIAGNOSIS — Z79899 Other long term (current) drug therapy: Secondary | ICD-10-CM | POA: Diagnosis not present

## 2014-04-08 DIAGNOSIS — K219 Gastro-esophageal reflux disease without esophagitis: Secondary | ICD-10-CM | POA: Diagnosis not present

## 2014-04-08 DIAGNOSIS — M810 Age-related osteoporosis without current pathological fracture: Secondary | ICD-10-CM | POA: Diagnosis not present

## 2014-07-30 DIAGNOSIS — H524 Presbyopia: Secondary | ICD-10-CM | POA: Diagnosis not present

## 2014-07-30 DIAGNOSIS — H35371 Puckering of macula, right eye: Secondary | ICD-10-CM | POA: Diagnosis not present

## 2014-07-30 DIAGNOSIS — H04123 Dry eye syndrome of bilateral lacrimal glands: Secondary | ICD-10-CM | POA: Diagnosis not present

## 2014-08-04 DIAGNOSIS — Z803 Family history of malignant neoplasm of breast: Secondary | ICD-10-CM | POA: Diagnosis not present

## 2014-08-04 DIAGNOSIS — Z1231 Encounter for screening mammogram for malignant neoplasm of breast: Secondary | ICD-10-CM | POA: Diagnosis not present

## 2014-08-21 DIAGNOSIS — H35372 Puckering of macula, left eye: Secondary | ICD-10-CM | POA: Diagnosis not present

## 2014-08-21 DIAGNOSIS — H43391 Other vitreous opacities, right eye: Secondary | ICD-10-CM | POA: Diagnosis not present

## 2014-08-21 DIAGNOSIS — H43811 Vitreous degeneration, right eye: Secondary | ICD-10-CM | POA: Diagnosis not present

## 2014-08-28 DIAGNOSIS — H17823 Peripheral opacity of cornea, bilateral: Secondary | ICD-10-CM | POA: Diagnosis not present

## 2014-08-28 DIAGNOSIS — Z961 Presence of intraocular lens: Secondary | ICD-10-CM | POA: Diagnosis not present

## 2014-08-28 DIAGNOSIS — H04123 Dry eye syndrome of bilateral lacrimal glands: Secondary | ICD-10-CM | POA: Diagnosis not present

## 2014-08-28 DIAGNOSIS — H1859 Other hereditary corneal dystrophies: Secondary | ICD-10-CM | POA: Diagnosis not present

## 2014-08-28 DIAGNOSIS — H1850 Unspecified hereditary corneal dystrophies: Secondary | ICD-10-CM | POA: Diagnosis not present

## 2014-08-28 DIAGNOSIS — H52213 Irregular astigmatism, bilateral: Secondary | ICD-10-CM | POA: Diagnosis not present

## 2014-10-13 DIAGNOSIS — F325 Major depressive disorder, single episode, in full remission: Secondary | ICD-10-CM | POA: Diagnosis not present

## 2014-10-13 DIAGNOSIS — M810 Age-related osteoporosis without current pathological fracture: Secondary | ICD-10-CM | POA: Diagnosis not present

## 2014-10-13 DIAGNOSIS — E785 Hyperlipidemia, unspecified: Secondary | ICD-10-CM | POA: Diagnosis not present

## 2014-10-13 DIAGNOSIS — Z0000 Encounter for general adult medical examination without abnormal findings: Secondary | ICD-10-CM | POA: Diagnosis not present

## 2014-10-13 DIAGNOSIS — E559 Vitamin D deficiency, unspecified: Secondary | ICD-10-CM | POA: Diagnosis not present

## 2014-10-13 DIAGNOSIS — I10 Essential (primary) hypertension: Secondary | ICD-10-CM | POA: Diagnosis not present

## 2014-10-13 DIAGNOSIS — K219 Gastro-esophageal reflux disease without esophagitis: Secondary | ICD-10-CM | POA: Diagnosis not present

## 2014-10-13 DIAGNOSIS — R7301 Impaired fasting glucose: Secondary | ICD-10-CM | POA: Diagnosis not present

## 2015-03-17 DIAGNOSIS — D1801 Hemangioma of skin and subcutaneous tissue: Secondary | ICD-10-CM | POA: Diagnosis not present

## 2015-03-17 DIAGNOSIS — Z85828 Personal history of other malignant neoplasm of skin: Secondary | ICD-10-CM | POA: Diagnosis not present

## 2015-03-17 DIAGNOSIS — L918 Other hypertrophic disorders of the skin: Secondary | ICD-10-CM | POA: Diagnosis not present

## 2015-03-17 DIAGNOSIS — B351 Tinea unguium: Secondary | ICD-10-CM | POA: Diagnosis not present

## 2015-03-17 DIAGNOSIS — L570 Actinic keratosis: Secondary | ICD-10-CM | POA: Diagnosis not present

## 2015-03-17 DIAGNOSIS — L814 Other melanin hyperpigmentation: Secondary | ICD-10-CM | POA: Diagnosis not present

## 2015-03-17 DIAGNOSIS — Z79899 Other long term (current) drug therapy: Secondary | ICD-10-CM | POA: Diagnosis not present

## 2015-03-17 DIAGNOSIS — L821 Other seborrheic keratosis: Secondary | ICD-10-CM | POA: Diagnosis not present

## 2015-04-12 DIAGNOSIS — F325 Major depressive disorder, single episode, in full remission: Secondary | ICD-10-CM | POA: Diagnosis not present

## 2015-04-12 DIAGNOSIS — R7301 Impaired fasting glucose: Secondary | ICD-10-CM | POA: Diagnosis not present

## 2015-04-12 DIAGNOSIS — K219 Gastro-esophageal reflux disease without esophagitis: Secondary | ICD-10-CM | POA: Diagnosis not present

## 2015-04-12 DIAGNOSIS — R946 Abnormal results of thyroid function studies: Secondary | ICD-10-CM | POA: Diagnosis not present

## 2015-04-12 DIAGNOSIS — E559 Vitamin D deficiency, unspecified: Secondary | ICD-10-CM | POA: Diagnosis not present

## 2015-04-12 DIAGNOSIS — E785 Hyperlipidemia, unspecified: Secondary | ICD-10-CM | POA: Diagnosis not present

## 2015-04-12 DIAGNOSIS — M791 Myalgia: Secondary | ICD-10-CM | POA: Diagnosis not present

## 2015-04-12 DIAGNOSIS — I10 Essential (primary) hypertension: Secondary | ICD-10-CM | POA: Diagnosis not present

## 2015-06-10 ENCOUNTER — Encounter: Payer: Self-pay | Admitting: Internal Medicine

## 2015-07-01 DIAGNOSIS — M5442 Lumbago with sciatica, left side: Secondary | ICD-10-CM | POA: Diagnosis not present

## 2015-07-01 DIAGNOSIS — I10 Essential (primary) hypertension: Secondary | ICD-10-CM | POA: Diagnosis not present

## 2015-07-01 DIAGNOSIS — G4710 Hypersomnia, unspecified: Secondary | ICD-10-CM | POA: Diagnosis not present

## 2015-07-01 DIAGNOSIS — R946 Abnormal results of thyroid function studies: Secondary | ICD-10-CM | POA: Diagnosis not present

## 2015-07-01 DIAGNOSIS — F325 Major depressive disorder, single episode, in full remission: Secondary | ICD-10-CM | POA: Diagnosis not present

## 2015-07-01 DIAGNOSIS — E785 Hyperlipidemia, unspecified: Secondary | ICD-10-CM | POA: Diagnosis not present

## 2015-07-07 ENCOUNTER — Telehealth: Payer: Self-pay | Admitting: Internal Medicine

## 2015-07-07 NOTE — Telephone Encounter (Signed)
Pt is due for colon recall. States she had an EGD done in 2001 and would like to have this done along with colon. States she has a family hx of stomach cancer. Please advise if pt can be direct ECL or if she needs to be seen.

## 2015-07-07 NOTE — Telephone Encounter (Signed)
Patient will be in [place] for vacation and wants Sept. Appt.

## 2015-07-07 NOTE — Telephone Encounter (Signed)
Ok for [facility] for screening colon cancer, family hx of stomach cancer

## 2015-07-07 NOTE — Telephone Encounter (Signed)
OK to schedule EGD and colon, see below.

## 2015-08-12 ENCOUNTER — Encounter: Payer: Self-pay | Admitting: Internal Medicine

## 2015-09-07 ENCOUNTER — Encounter: Payer: Self-pay | Admitting: Internal Medicine

## 2015-10-14 DIAGNOSIS — E785 Hyperlipidemia, unspecified: Secondary | ICD-10-CM | POA: Diagnosis not present

## 2015-10-14 DIAGNOSIS — F325 Major depressive disorder, single episode, in full remission: Secondary | ICD-10-CM | POA: Diagnosis not present

## 2015-10-14 DIAGNOSIS — Z0000 Encounter for general adult medical examination without abnormal findings: Secondary | ICD-10-CM | POA: Diagnosis not present

## 2015-10-14 DIAGNOSIS — E559 Vitamin D deficiency, unspecified: Secondary | ICD-10-CM | POA: Diagnosis not present

## 2015-10-14 DIAGNOSIS — I10 Essential (primary) hypertension: Secondary | ICD-10-CM | POA: Diagnosis not present

## 2015-10-14 DIAGNOSIS — Z23 Encounter for immunization: Secondary | ICD-10-CM | POA: Diagnosis not present

## 2015-10-14 DIAGNOSIS — K219 Gastro-esophageal reflux disease without esophagitis: Secondary | ICD-10-CM | POA: Diagnosis not present

## 2015-10-28 ENCOUNTER — Encounter (INDEPENDENT_AMBULATORY_CARE_PROVIDER_SITE_OTHER): Payer: Self-pay

## 2015-10-28 ENCOUNTER — Encounter: Payer: Self-pay | Admitting: Internal Medicine

## 2015-10-28 ENCOUNTER — Ambulatory Visit (AMBULATORY_SURGERY_CENTER): Payer: Self-pay

## 2015-10-28 VITALS — Ht 64.5 in | Wt 218.0 lb

## 2015-10-28 DIAGNOSIS — Z800 Family history of malignant neoplasm of digestive organs: Secondary | ICD-10-CM

## 2015-10-28 MED ORDER — SUPREP BOWEL PREP KIT 17.5-3.13-1.6 GM/177ML PO SOLN: 1.0000 | Freq: Once | ORAL | 0 refills | Status: AC

## 2015-10-28 NOTE — Progress Notes (Signed)
No allergies to eggs or soy No past problems with anesthesia No diet meds No home oxygen  Declined emmi 

## 2015-11-11 ENCOUNTER — Encounter: Payer: Self-pay | Admitting: Internal Medicine

## 2015-11-11 ENCOUNTER — Ambulatory Visit (AMBULATORY_SURGERY_CENTER): Payer: Commercial Managed Care - HMO | Admitting: Internal Medicine

## 2015-11-11 VITALS — BP 139/54 | HR 53 | Temp 96.8°F | Resp 22 | Ht 64.0 in | Wt 218.0 lb

## 2015-11-11 DIAGNOSIS — Z1212 Encounter for screening for malignant neoplasm of rectum: Secondary | ICD-10-CM | POA: Diagnosis not present

## 2015-11-11 DIAGNOSIS — Z1211 Encounter for screening for malignant neoplasm of colon: Secondary | ICD-10-CM | POA: Diagnosis present

## 2015-11-11 DIAGNOSIS — D125 Benign neoplasm of sigmoid colon: Secondary | ICD-10-CM | POA: Diagnosis not present

## 2015-11-11 DIAGNOSIS — D120 Benign neoplasm of cecum: Secondary | ICD-10-CM | POA: Diagnosis not present

## 2015-11-11 DIAGNOSIS — K319 Disease of stomach and duodenum, unspecified: Secondary | ICD-10-CM | POA: Diagnosis not present

## 2015-11-11 DIAGNOSIS — K2950 Unspecified chronic gastritis without bleeding: Secondary | ICD-10-CM | POA: Diagnosis not present

## 2015-11-11 DIAGNOSIS — Z800 Family history of malignant neoplasm of digestive organs: Secondary | ICD-10-CM | POA: Diagnosis not present

## 2015-11-11 DIAGNOSIS — K635 Polyp of colon: Secondary | ICD-10-CM

## 2015-11-11 DIAGNOSIS — D128 Benign neoplasm of rectum: Secondary | ICD-10-CM

## 2015-11-11 DIAGNOSIS — D175 Benign lipomatous neoplasm of intra-abdominal organs: Secondary | ICD-10-CM | POA: Diagnosis not present

## 2015-11-11 DIAGNOSIS — D129 Benign neoplasm of anus and anal canal: Secondary | ICD-10-CM

## 2015-11-11 DIAGNOSIS — D124 Benign neoplasm of descending colon: Secondary | ICD-10-CM

## 2015-11-11 DIAGNOSIS — D123 Benign neoplasm of transverse colon: Secondary | ICD-10-CM

## 2015-11-11 DIAGNOSIS — K2970 Gastritis, unspecified, without bleeding: Secondary | ICD-10-CM

## 2015-11-11 MED ORDER — PANTOPRAZOLE SODIUM 40 MG PO TBEC: 40.0000 mg | DELAYED_RELEASE_TABLET | Freq: Every day | ORAL | 3 refills | Status: DC

## 2015-11-11 MED ORDER — SODIUM CHLORIDE 0.9 % IV SOLN: 500.0000 mL | INTRAVENOUS | Status: DC

## 2015-11-11 NOTE — Progress Notes (Signed)
A and O x3. Report to RN. Tolerated MAC anesthesia well.Teeth unchanged after procedure. 

## 2015-11-11 NOTE — Patient Instructions (Signed)
Impression/Recommendations:  Endoscopy:  Hiatal hernia handout given Gastritis - handout given  Colon:    Polyps hand out given Diverticulosis hand out given High Fiber diet hand out given Hemorrhoids hand out given  Repeat colonoscopy pending pathology results.  YOU HAD AN ENDOSCOPIC PROCEDURE TODAY AT [facility]:   Refer to the procedure report that was given to you for any specific questions about what was found during the examination.  If the procedure report does not answer your questions, please call your gastroenterologist to clarify.  If you requested that your care partner not be given the details of your procedure findings, then the procedure report has been included in a sealed envelope for you to review at your convenience later.  YOU SHOULD EXPECT: Some feelings of bloating in the abdomen. Passage of more gas than usual.  Walking can help get rid of the air that was put into your GI tract during the procedure and reduce the bloating. If you had a lower endoscopy (such as a colonoscopy or flexible sigmoidoscopy) you may notice spotting of blood in your stool or on the toilet paper. If you underwent a bowel prep for your procedure, you may not have a normal bowel movement for a few days.  Please Note:  You might notice some irritation and congestion in your nose or some drainage.  This is from the oxygen used during your procedure.  There is no need for concern and it should clear up in a day or so.  SYMPTOMS TO REPORT IMMEDIATELY:   Following lower endoscopy (colonoscopy or flexible sigmoidoscopy):  Excessive amounts of blood in the stool  Significant tenderness or worsening of abdominal pains  Swelling of the abdomen that is new, acute  Fever of 100F or higher   Following upper endoscopy (EGD)  Vomiting of blood or coffee ground material  New chest pain or pain under the shoulder blades  Painful or persistently difficult swallowing  New shortness of  breath  Fever of 100F or higher  Black, tarry-looking stools  For urgent or emergent issues, a gastroenterologist can be reached at any hour by calling [phone].   DIET:  We do recommend a small meal at first, but then you may proceed to your regular diet.  Drink plenty of fluids but you should avoid alcoholic beverages for 24 hours.  ACTIVITY:  You should plan to take it easy for the rest of today and you should NOT DRIVE or use heavy machinery until tomorrow (because of the sedation medicines used during the test).    FOLLOW UP: Our staff will call the number listed on your records the next business day following your procedure to check on you and address any questions or concerns that you may have regarding the information given to you following your procedure. If we do not reach you, we will leave a message.  However, if you are feeling well and you are not experiencing any problems, there is no need to return our call.  We will assume that you have returned to your regular daily activities without incident.  If any biopsies were taken you will be contacted by phone or by letter within the next 1-3 weeks.  Please call us at [phone] if you have not heard about the biopsies in 3 weeks.    SIGNATURES/CONFIDENTIALITY: You and/or your care partner have signed paperwork which will be entered into your electronic medical record.  These signatures attest to the fact that that  the information above on your After Visit Summary has been reviewed and is understood.  Full responsibility of the confidentiality of this discharge information lies with you and/or your care-partner.

## 2015-11-11 NOTE — Op Note (Signed)
[facility] Patient Name: Alison Ayala Procedure Date: 11/11/2015 8:31 AM MRN: [id number] Endoscopist: Jerene Bears , MD Age: 70 Referring MD:  Date of Birth: [birth year] Gender: Female Account #: [id number] Procedure:                Upper GI endoscopy Indications:              Gastro-esophageal reflux disease, Personal history                            of malignant gastric neoplasm Medicines:                Monitored Anesthesia Care Procedure:                Pre-Anesthesia Assessment:                           - Prior to the procedure, a History and Physical                            was performed, and patient medications and                            allergies were reviewed. The patient's tolerance of                            previous anesthesia was also reviewed. The risks                            and benefits of the procedure and the sedation                            options and risks were discussed with the patient.                            All questions were answered, and informed consent                            was obtained. Prior Anticoagulants: The patient has                            taken no previous anticoagulant or antiplatelet                            agents. ASA Grade Assessment: II - A patient with                            mild systemic disease. After reviewing the risks                            and benefits, the patient was deemed in                            satisfactory condition to undergo the procedure.  After obtaining informed consent, the endoscope was                            passed under direct vision. Throughout the                            procedure, the patient's blood pressure, pulse, and                            oxygen saturations were monitored continuously. The                            Model GIF-[id number]) scope was introduced                            through the mouth, and  advanced to the second part                            of duodenum. The upper GI endoscopy was                            accomplished without difficulty. The patient                            tolerated the procedure well. Scope In: Scope Out: Findings:                 The examined esophagus was normal. Z-line regular.                           A 2 cm hiatal hernia was present.                           Patchy minimal inflammation characterized by                            erythema was found in the gastric body. Biopsies                            were taken with a cold forceps for histology and                            Helicobacter pylori testing.                           The examined duodenum was normal. Complications:            No immediate complications. Estimated Blood Loss:     Estimated blood loss was minimal. Impression:               - Normal esophagus.                           - 2 cm hiatal hernia.                           -  Mild gastritis. Biopsied.                           - Normal examined duodenum. Recommendation:           - Patient has a contact number available for                            emergencies. The signs and symptoms of potential                            delayed complications were discussed with the                            patient. Return to normal activities tomorrow.                            Written discharge instructions were provided to the                            patient.                           - Resume previous diet.                           - Continue present medications. Given indigestion                            despite ranitidine twice daily, trial of                            pantoprazole 40 mg daily (can discontinue                            ranitidine and use as needed for breakthrough                            indigestion/heartburn)                           - Await pathology results. Jerene Bears, MD 11/11/2015  9:13:56 AM This report has been signed electronically.

## 2015-11-11 NOTE — Progress Notes (Signed)
Called to room to assist during endoscopic procedure.  Patient ID and intended procedure confirmed with present staff. Received instructions for my participation in the procedure from the performing physician.  

## 2015-11-11 NOTE — Op Note (Signed)
[place] [facility] Patient Name: Alison Ayala Procedure Date: 11/11/2015 8:46 AM MRN: [id number] Endoscopist: Jerene Bears , MD Age: 70 Referring MD:  Date of Birth: July 26, 1945 Gender: Female Account #: [id number] Procedure:                Colonoscopy Indications:              Screening in patient at increased risk: Family                            history of 1st-degree relative with colorectal                            cancer, Last colonoscopy: 2012 Medicines:                Monitored Anesthesia Care Procedure:                Pre-Anesthesia Assessment:                           - Prior to the procedure, a History and Physical                            was performed, and patient medications and                            allergies were reviewed. The patient's tolerance of                            previous anesthesia was also reviewed. The risks                            and benefits of the procedure and the sedation                            options and risks were discussed with the patient.                            All questions were answered, and informed consent                            was obtained. Prior Anticoagulants: The patient has                            taken no previous anticoagulant or antiplatelet                            agents. ASA Grade Assessment: II - A patient with                            mild systemic disease. After reviewing the risks                            and benefits, the patient was deemed in  satisfactory condition to undergo the procedure.                           After obtaining informed consent, the colonoscope                            was passed under direct vision. Throughout the                            procedure, the patient's blood pressure, pulse, and                            oxygen saturations were monitored continuously. The                            Model CF-HQ190L [id number]) scope  was introduced                            through the anus and advanced to the the cecum,                            identified by appendiceal orifice and ileocecal                            valve. The colonoscopy was performed without                            difficulty. The patient tolerated the procedure                            well. The quality of the bowel preparation was                            good. The ileocecal valve, appendiceal orifice, and                            rectum were photographed. Scope In: 8:47:27 AM Scope Out: 9:07:55 AM Scope Withdrawal Time: 0 hours 17 minutes 24 seconds  Total Procedure Duration: 0 hours 20 minutes 28 seconds  Findings:                 The digital rectal exam was normal.                           A 5 mm polyp was found in the ileocecal valve. The                            polyp was sessile. The polyp was removed with a                            cold snare. Resection and retrieval were complete.                           A 7 mm polyp was found in the transverse  colon. The                            polyp was sessile. The polyp was removed with a                            cold snare. Resection and retrieval were complete.                           Two sessile polyps were found in the sigmoid colon                            and descending colon. The polyps were 3 to 5 mm in                            size. These polyps were removed with a cold snare.                            Resection and retrieval were complete.                           A 2 mm polyp was found in the rectum. The polyp was                            sessile. The polyp was removed with a cold biopsy                            forceps. Resection and retrieval were complete.                           Multiple medium-mouthed diverticula were found in                            the sigmoid colon.                           Internal hemorrhoids were found during                             retroflexion. The hemorrhoids were small. Complications:            No immediate complications. Estimated Blood Loss:     Estimated blood loss was minimal. Impression:               - One 5 mm polyp at the ileocecal valve, removed                            with a cold snare. Resected and retrieved.                           - One 7 mm polyp in the transverse colon, removed                            with a cold snare. Resected  and retrieved.                           - Two 3 to 5 mm polyps in the sigmoid colon and in                            the descending colon, removed with a cold snare.                            Resected and retrieved.                           - One 2 mm polyp in the rectum, removed with a cold                            biopsy forceps. Resected and retrieved.                           - Mild diverticulosis in the sigmoid colon.                           - Internal hemorrhoids. Recommendation:           - Patient has a contact number available for                            emergencies. The signs and symptoms of potential                            delayed complications were discussed with the                            patient. Return to normal activities tomorrow.                            Written discharge instructions were provided to the                            patient.                           - Resume previous diet.                           - Continue present medications.                           - Await pathology results.                           - Repeat colonoscopy is recommended. The                            colonoscopy date will be determined after pathology  results from today's exam become available for                            review. Jerene Bears, MD 11/11/2015 9:17:57 AM This report has been signed electronically.

## 2015-11-12 ENCOUNTER — Telehealth: Payer: Self-pay | Admitting: *Deleted

## 2015-11-12 NOTE — Telephone Encounter (Signed)
  Follow up Call-  Call back number 11/11/2015  Post procedure Call Back phone  # [phone]  Permission to leave phone message Yes  Some recent data might be hidden     Patient questions:  Do you have a fever, pain , or abdominal swelling? No. Pain Score  0 *  Have you tolerated food without any problems? Yes.    Have you been able to return to your normal activities? Yes.    Do you have any questions about your discharge instructions: Diet   No. Medications  No. Follow up visit  No.  Do you have questions or concerns about your Care? No.  Actions: * If pain score is 4 or above: No action needed, pain <4.

## 2015-11-19 ENCOUNTER — Encounter: Payer: Self-pay | Admitting: Internal Medicine

## 2016-01-24 DIAGNOSIS — Z1231 Encounter for screening mammogram for malignant neoplasm of breast: Secondary | ICD-10-CM | POA: Diagnosis not present

## 2016-01-24 DIAGNOSIS — Z803 Family history of malignant neoplasm of breast: Secondary | ICD-10-CM | POA: Diagnosis not present

## 2016-03-17 DIAGNOSIS — L820 Inflamed seborrheic keratosis: Secondary | ICD-10-CM | POA: Diagnosis not present

## 2016-03-17 DIAGNOSIS — L570 Actinic keratosis: Secondary | ICD-10-CM | POA: Diagnosis not present

## 2016-03-17 DIAGNOSIS — L821 Other seborrheic keratosis: Secondary | ICD-10-CM | POA: Diagnosis not present

## 2016-03-17 DIAGNOSIS — L918 Other hypertrophic disorders of the skin: Secondary | ICD-10-CM | POA: Diagnosis not present

## 2016-03-17 DIAGNOSIS — L720 Epidermal cyst: Secondary | ICD-10-CM | POA: Diagnosis not present

## 2016-03-17 DIAGNOSIS — L603 Nail dystrophy: Secondary | ICD-10-CM | POA: Diagnosis not present

## 2016-03-17 DIAGNOSIS — Z85828 Personal history of other malignant neoplasm of skin: Secondary | ICD-10-CM | POA: Diagnosis not present

## 2016-03-17 DIAGNOSIS — B351 Tinea unguium: Secondary | ICD-10-CM | POA: Diagnosis not present

## 2016-03-17 DIAGNOSIS — D1801 Hemangioma of skin and subcutaneous tissue: Secondary | ICD-10-CM | POA: Diagnosis not present

## 2016-03-17 DIAGNOSIS — L814 Other melanin hyperpigmentation: Secondary | ICD-10-CM | POA: Diagnosis not present

## 2016-04-12 DIAGNOSIS — K219 Gastro-esophageal reflux disease without esophagitis: Secondary | ICD-10-CM | POA: Diagnosis not present

## 2016-04-12 DIAGNOSIS — E785 Hyperlipidemia, unspecified: Secondary | ICD-10-CM | POA: Diagnosis not present

## 2016-04-12 DIAGNOSIS — E559 Vitamin D deficiency, unspecified: Secondary | ICD-10-CM | POA: Diagnosis not present

## 2016-04-12 DIAGNOSIS — F325 Major depressive disorder, single episode, in full remission: Secondary | ICD-10-CM | POA: Diagnosis not present

## 2016-04-12 DIAGNOSIS — I10 Essential (primary) hypertension: Secondary | ICD-10-CM | POA: Diagnosis not present

## 2016-06-23 DIAGNOSIS — I10 Essential (primary) hypertension: Secondary | ICD-10-CM | POA: Diagnosis not present

## 2016-06-23 DIAGNOSIS — R350 Frequency of micturition: Secondary | ICD-10-CM | POA: Diagnosis not present

## 2016-06-23 DIAGNOSIS — M5441 Lumbago with sciatica, right side: Secondary | ICD-10-CM | POA: Diagnosis not present

## 2016-06-23 DIAGNOSIS — M5442 Lumbago with sciatica, left side: Secondary | ICD-10-CM | POA: Diagnosis not present

## 2016-09-12 DIAGNOSIS — H52213 Irregular astigmatism, bilateral: Secondary | ICD-10-CM | POA: Diagnosis not present

## 2016-09-12 DIAGNOSIS — H35372 Puckering of macula, left eye: Secondary | ICD-10-CM | POA: Diagnosis not present

## 2016-09-12 DIAGNOSIS — H17823 Peripheral opacity of cornea, bilateral: Secondary | ICD-10-CM | POA: Diagnosis not present

## 2016-09-12 DIAGNOSIS — H04123 Dry eye syndrome of bilateral lacrimal glands: Secondary | ICD-10-CM | POA: Diagnosis not present

## 2016-09-12 DIAGNOSIS — H1859 Other hereditary corneal dystrophies: Secondary | ICD-10-CM | POA: Diagnosis not present

## 2016-09-12 DIAGNOSIS — H1850 Unspecified hereditary corneal dystrophies: Secondary | ICD-10-CM | POA: Diagnosis not present

## 2016-09-22 DIAGNOSIS — L821 Other seborrheic keratosis: Secondary | ICD-10-CM | POA: Diagnosis not present

## 2016-09-22 DIAGNOSIS — L918 Other hypertrophic disorders of the skin: Secondary | ICD-10-CM | POA: Diagnosis not present

## 2016-09-22 DIAGNOSIS — B078 Other viral warts: Secondary | ICD-10-CM | POA: Diagnosis not present

## 2016-09-29 DIAGNOSIS — Z0100 Encounter for examination of eyes and vision without abnormal findings: Secondary | ICD-10-CM | POA: Diagnosis not present

## 2016-10-24 DIAGNOSIS — R7301 Impaired fasting glucose: Secondary | ICD-10-CM | POA: Diagnosis not present

## 2016-10-24 DIAGNOSIS — K219 Gastro-esophageal reflux disease without esophagitis: Secondary | ICD-10-CM | POA: Diagnosis not present

## 2016-10-24 DIAGNOSIS — M810 Age-related osteoporosis without current pathological fracture: Secondary | ICD-10-CM | POA: Diagnosis not present

## 2016-10-24 DIAGNOSIS — E785 Hyperlipidemia, unspecified: Secondary | ICD-10-CM | POA: Diagnosis not present

## 2016-10-24 DIAGNOSIS — I10 Essential (primary) hypertension: Secondary | ICD-10-CM | POA: Diagnosis not present

## 2016-10-24 DIAGNOSIS — Z23 Encounter for immunization: Secondary | ICD-10-CM | POA: Diagnosis not present

## 2016-10-24 DIAGNOSIS — Z0000 Encounter for general adult medical examination without abnormal findings: Secondary | ICD-10-CM | POA: Diagnosis not present

## 2016-10-24 DIAGNOSIS — E559 Vitamin D deficiency, unspecified: Secondary | ICD-10-CM | POA: Diagnosis not present

## 2016-12-05 DIAGNOSIS — M8588 Other specified disorders of bone density and structure, other site: Secondary | ICD-10-CM | POA: Diagnosis not present

## 2016-12-05 DIAGNOSIS — M810 Age-related osteoporosis without current pathological fracture: Secondary | ICD-10-CM | POA: Diagnosis not present

## 2017-02-06 DIAGNOSIS — J849 Interstitial pulmonary disease, unspecified: Secondary | ICD-10-CM

## 2017-02-06 HISTORY — DX: Interstitial pulmonary disease, unspecified: J84.9

## 2017-02-12 DIAGNOSIS — Z1231 Encounter for screening mammogram for malignant neoplasm of breast: Secondary | ICD-10-CM | POA: Diagnosis not present

## 2017-03-23 DIAGNOSIS — B070 Plantar wart: Secondary | ICD-10-CM | POA: Diagnosis not present

## 2017-03-23 DIAGNOSIS — L821 Other seborrheic keratosis: Secondary | ICD-10-CM | POA: Diagnosis not present

## 2017-03-23 DIAGNOSIS — D1801 Hemangioma of skin and subcutaneous tissue: Secondary | ICD-10-CM | POA: Diagnosis not present

## 2017-03-23 DIAGNOSIS — D2261 Melanocytic nevi of right upper limb, including shoulder: Secondary | ICD-10-CM | POA: Diagnosis not present

## 2017-03-23 DIAGNOSIS — Z85828 Personal history of other malignant neoplasm of skin: Secondary | ICD-10-CM | POA: Diagnosis not present

## 2017-03-23 DIAGNOSIS — D2272 Melanocytic nevi of left lower limb, including hip: Secondary | ICD-10-CM | POA: Diagnosis not present

## 2017-04-25 DIAGNOSIS — E785 Hyperlipidemia, unspecified: Secondary | ICD-10-CM | POA: Diagnosis not present

## 2017-04-25 DIAGNOSIS — R300 Dysuria: Secondary | ICD-10-CM | POA: Diagnosis not present

## 2017-04-25 DIAGNOSIS — F3341 Major depressive disorder, recurrent, in partial remission: Secondary | ICD-10-CM | POA: Diagnosis not present

## 2017-04-25 DIAGNOSIS — I10 Essential (primary) hypertension: Secondary | ICD-10-CM | POA: Diagnosis not present

## 2017-04-25 DIAGNOSIS — K219 Gastro-esophageal reflux disease without esophagitis: Secondary | ICD-10-CM | POA: Diagnosis not present

## 2017-06-05 DIAGNOSIS — F325 Major depressive disorder, single episode, in full remission: Secondary | ICD-10-CM | POA: Diagnosis not present

## 2017-06-05 DIAGNOSIS — M799 Soft tissue disorder, unspecified: Secondary | ICD-10-CM | POA: Diagnosis not present

## 2017-06-05 DIAGNOSIS — S20212A Contusion of left front wall of thorax, initial encounter: Secondary | ICD-10-CM | POA: Diagnosis not present

## 2017-06-05 DIAGNOSIS — I10 Essential (primary) hypertension: Secondary | ICD-10-CM | POA: Diagnosis not present

## 2017-06-06 ENCOUNTER — Other Ambulatory Visit: Payer: Self-pay | Admitting: Family Medicine

## 2017-06-06 DIAGNOSIS — M7989 Other specified soft tissue disorders: Secondary | ICD-10-CM

## 2017-06-08 ENCOUNTER — Ambulatory Visit
Admission: RE | Admit: 2017-06-08 | Discharge: 2017-06-08 | Disposition: A | Discharge status: Home / Self Care | Payer: Commercial Managed Care - HMO | Source: Ambulatory Visit | Attending: Family Medicine | Admitting: Family Medicine

## 2017-06-08 DIAGNOSIS — R222 Localized swelling, mass and lump, trunk: Secondary | ICD-10-CM | POA: Diagnosis not present

## 2017-06-08 DIAGNOSIS — M7989 Other specified soft tissue disorders: Secondary | ICD-10-CM

## 2017-06-13 ENCOUNTER — Other Ambulatory Visit: Payer: Self-pay | Admitting: Family Medicine

## 2017-06-13 DIAGNOSIS — M7989 Other specified soft tissue disorders: Secondary | ICD-10-CM

## 2017-06-19 ENCOUNTER — Ambulatory Visit
Admission: RE | Admit: 2017-06-19 | Discharge: 2017-06-19 | Disposition: A | Discharge status: Home / Self Care | Payer: Medicare HMO | Source: Ambulatory Visit | Attending: Family Medicine | Admitting: Family Medicine

## 2017-06-19 DIAGNOSIS — S2242XD Multiple fractures of ribs, left side, subsequent encounter for fracture with routine healing: Secondary | ICD-10-CM | POA: Diagnosis not present

## 2017-06-19 DIAGNOSIS — M7989 Other specified soft tissue disorders: Secondary | ICD-10-CM

## 2017-06-19 MED ORDER — IOHEXOL 300 MG/ML  SOLN
75.0000 mL | Freq: Once | INTRAMUSCULAR | Status: AC | PRN
  Administered 2017-06-19: 75 mL via INTRAVENOUS

## 2017-06-22 DIAGNOSIS — J9859 Other diseases of mediastinum, not elsewhere classified: Secondary | ICD-10-CM | POA: Diagnosis not present

## 2017-06-22 DIAGNOSIS — R0600 Dyspnea, unspecified: Secondary | ICD-10-CM | POA: Diagnosis not present

## 2017-06-22 DIAGNOSIS — R918 Other nonspecific abnormal finding of lung field: Secondary | ICD-10-CM | POA: Diagnosis not present

## 2017-06-22 DIAGNOSIS — D497 Neoplasm of unspecified behavior of endocrine glands and other parts of nervous system: Secondary | ICD-10-CM | POA: Diagnosis not present

## 2017-06-26 ENCOUNTER — Other Ambulatory Visit: Payer: Self-pay | Admitting: Family Medicine

## 2017-06-26 DIAGNOSIS — E278 Other specified disorders of adrenal gland: Secondary | ICD-10-CM

## 2017-07-03 ENCOUNTER — Ambulatory Visit: Payer: Medicare HMO | Admitting: Internal Medicine

## 2017-07-03 ENCOUNTER — Other Ambulatory Visit (INDEPENDENT_AMBULATORY_CARE_PROVIDER_SITE_OTHER): Payer: Medicare HMO

## 2017-07-03 ENCOUNTER — Encounter: Payer: Self-pay | Admitting: Internal Medicine

## 2017-07-03 VITALS — BP 134/84 | HR 66 | Ht 64.0 in | Wt 226.8 lb

## 2017-07-03 DIAGNOSIS — R9389 Abnormal findings on diagnostic imaging of other specified body structures: Secondary | ICD-10-CM | POA: Diagnosis not present

## 2017-07-03 DIAGNOSIS — R0609 Other forms of dyspnea: Secondary | ICD-10-CM

## 2017-07-03 DIAGNOSIS — J849 Interstitial pulmonary disease, unspecified: Secondary | ICD-10-CM | POA: Diagnosis not present

## 2017-07-03 DIAGNOSIS — J9859 Other diseases of mediastinum, not elsewhere classified: Secondary | ICD-10-CM | POA: Diagnosis not present

## 2017-07-03 DIAGNOSIS — R0600 Dyspnea, unspecified: Secondary | ICD-10-CM

## 2017-07-03 LAB — SEDIMENTATION RATE: Sed Rate: 16 mm/hr (ref 0–30)

## 2017-07-03 NOTE — Progress Notes (Addendum)
Subjective:     Patient ID: Alison Ayala, female   DOB: [birth year], 72 y.o.   MRN: [id number] PCP Harlan Stains, MD  HPI  IOV 07/03/2017  Chief Complaint  Patient presents with  . Pulm Consult    Referred by Dr. Harlan Stains for possible ILD. Per patient, she does have some occasional SOB and chest tightness.    Alison Ayala 72 y.o. female [place]  [place] [address] -referred for evaluation of potential interstitial lung disease.  According to the patient on routine exam it was felt that the sternal head of the clavicles was a little bit prominent so therefore a chest x-ray was done by the primary care physician which was not revealing.  Subsequently patient underwent CT scan of the chest documented below that I personally visualized and agree with the findings.  The 2 significant findings are anterior mediastinal mass with partial calcification and also possible interstitial lung disease and therefore she has been referred here.  She is denying any acute complaints . SHe has appt with Dr Nils Pyle for anterior mediastinal mass  We performed the pulmonary integrated interstitial lung disease questionnaire and for the symptoms she denied any cough but she did admit to shortness of breath that started insidiously approximately 2 years ago and for the last 6 months is gradually gotten worse.  It is not episodic.  It is present on exertion and relieved by rest.  Today when we walked her she did drop her pulse ox greater than 3 points but did not go below 88% her heart rate did respond but it did not go over 90/min.  On a level 5 scale she rates level 3 dyspnea for picking up and straightening, sweeping and vacuuming walking up a hill and walking upstairs.  She rates a level 2 dyspnea for walking on a level ground at her own pace and walking with appears, making the bed, shopping and doing laundry.  She gives a level 1 dyspnea for standing up and dressing but otherwise all  other activities a level 0.  Review of systems: She has chronic fatigue and arthralgia.  She also has dysphagia for the last few to several years [4-5 years].  She has had dry eyes for the last few decades and dry mouth for the last 1 year.  She admits to acid reflux disease for the last 40 years and on and off cold sores in her mouth.  There is no recurrent fever or weight loss or Raynaud  Past medical history: Positive for acid reflux for several decades but negative for asthma, COPD, heart failure, rheumatoid arthritis, other collagen vascular disease, sleep apnea, immune system disorder, pulmonary hypertension, thyroid disease, stroke, seizures, hepatitis, tuberculosis, kidney disease, pneumonia or blood clots, heart disease pleurisy  Family history of lung disease: Denies any pulmonary fibrosis or any other lung disease  Exposure history: Denies any smoking, drugs of abuse.  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  In particular she denies any occupation that might predispose her to hypersensitivity pneumonitis or inorganic dust related interstitial lung disease.  Pulmonary toxicity history: Extensive drug list causing pulmonary toxicity reviewed.  And negative for all of that    Simple office walk 185 feet x  3 laps goal with forehead probe 07/03/2017  O2 used Room air  Number laps completed 3  Comments about pace   Resting Pulse Ox/HR 96% and 64/min  Final Pulse Ox/HR 92% and 88/min  Desaturated </= 88% no0  Desaturated <= 3% points yes  Got Tachycardic >/= 90/min no  Symptoms at end of test Dyspnea reproduced  Miscellaneous comments Normal pace slow      CT chest 06/19/17 = personally visualized the CT chest and agree with the findings of large anterior mediastinal mass and possible ILD - IMPRESSION: 1. No focal  soft tissue mass or other abnormality to account for the perceived palpable abnormality in the suprasternal region. 2. However, there is a large anterior mediastinal mass which demonstrates heterogeneous enhancement and calcification, with several smaller adjacent lesions. These findings are concerning for potential neoplasm, with primary differential considerations including germ cell neoplasm or lesion of thymic origin. Thoracic surgical consultation is strongly recommended. 3. 1.7 x 2.0 cm left adrenal nodule is indeterminate. Further characterization with nonemergent adrenal protocol CT scan is recommended in the near future to exclude the possibility of a metastatic lesion. 4. Unusual appearance of the lung parenchyma which is concerning for potential interstitial lung disease. Outpatient referral to pulmonology for further evaluation is suggested, as well as a follow-up high-resolution chest CT in 6-12 months to assess for temporal changes in the appearance of the lung parenchyma. 5. Multiple healing left-sided rib fractures, as above. These are all nondisplaced or minimally displaced. No associated pneumothorax noted at this time.   Electronically Signed   By: Vinnie Langton M.D.   On: 06/20/2017 09:20   Results for Alison Ayala, Alison Ayala (MRN [id number]) as of 07/03/2017 13:34  Ref. Range 07/26/2011 08:59  Hemoglobin Latest Ref Range: 12.0 - 15.0 g/dL 20.7 (H)     has a past medical history of Carpal tunnel syndrome of right wrist, Diverticulosis, GERD (gastroesophageal reflux disease), H/O hiatal hernia, H/O intraventricular hemorrhage (2004--  SPONTANEOUS), Heart murmur, Hip pain, right (OCCASIONAL -- PINCHED NERVE), History of gastric ulcer (2001), Hyperlipidemia, Hypertension, Hypoglycemia, MVP (mitral valve prolapse), Normal echocardiogram (30 YRS AGO), S/P ventriculoperitoneal shunt (2004--- PER PT SHUNT IS WORKING WELL --  NO ISSUES), and Squamous cell cancer of scalp and  skin of neck.   reports that she has never smoked. She has never used smokeless tobacco.  Past Surgical History:  Procedure Laterality Date  . BILATERAL FRONTAL INTERVENTRICULAR CATHETER VIA TWIST DRILL HOLE  AUG 2004   SPONTANEOUS SUBARACHNOID  HEMORRHAGE AND  INTRAVENTRICULAR HEMORRHAGE  . CARPAL TUNNEL RELEASE  07/26/2011   Procedure: CARPAL TUNNEL RELEASE;  Surgeon: Magnus Sinning, MD;  [facility];  Service: Orthopedics;  Laterality: Right;  . CATARACT EXTRACTION W/ INTRAOCULAR LENS  IMPLANT, BILATERAL  2012   BILATERAL  . DUPUYTREN CONTRACTURE RELEASE  07/26/2011   Procedure: DUPUYTREN CONTRACTURE RELEASE;  Surgeon: Magnus Sinning, MD;  Location: [facility];  Service: Orthopedics;  Laterality: Right;  EXCISION OF DUPYTRENS BAND THUMB- INDEX WEB SPACE  . RETINAL DETACHMENT REPAIR W/ SCLERAL BUCKLE LE  03-10-2011   LEFT EYE  . VENTRICULOPERITONEAL SHUNT  10-08-2002   RIGHT FRONTAL FOR COMMUNICATING HYDROCEPHALUS    Allergies  Allergen Reactions  . Atorvastatin     fatigue  . Carvedilol     Abdominal bloating and palpations  . Claritin [Loratadine]     Blurred vision and dry eyes  . Lisinopril     "drunk feeling"     There is no immunization history on file  for this patient.  Family History  Problem Relation Age of Onset  . Colon cancer Mother 40  . Stomach cancer Maternal Aunt   . Breast cancer Maternal Aunt   . Uterine cancer Maternal Aunt      Current Outpatient Medications:  .  amLODipine (NORVASC) 5 MG tablet, Take 5 mg by mouth daily., Disp: , Rfl:  .  buPROPion (WELLBUTRIN XL) 300 MG 24 hr tablet, Take 300 mg by mouth daily., Disp: , Rfl:  .  esomeprazole (NEXIUM) 40 MG capsule, Take 40 mg by mouth daily at 12 noon., Disp: , Rfl:  .  hydrochlorothiazide (HYDRODIURIL) 25 MG tablet, Take 25 mg by mouth daily., Disp: , Rfl:  .  metoprolol succinate (TOPROL-XL) 100 MG 24 hr tablet, Take 100 mg by mouth daily. Take  with or immediately following a meal., Disp: , Rfl:  .  pravastatin (PRAVACHOL) 80 MG tablet, Take 80 mg by mouth daily., Disp: , Rfl:   Current Facility-Administered Medications:  .  0.9 %  sodium chloride infusion, 500 mL, Intravenous, Continuous, Pyrtle, Lajuan Lines, MD   Review of Systems     Objective:   Physical Exam  Constitutional: She is oriented to person, place, and time. She appears well-developed and well-nourished. No distress.  obese  HENT:  Head: Normocephalic and atraumatic.  Right Ear: External ear normal.  Left Ear: External ear normal.  Mouth/Throat: Oropharynx is clear and moist. No oropharyngeal exudate.  Eyes: Pupils are equal, round, and reactive to light. Conjunctivae and EOM are normal. Right eye exhibits no discharge. Left eye exhibits no discharge. No scleral icterus.  Neck: Normal range of motion. Neck supple. No JVD present. No tracheal deviation present. No thyromegaly present.  Cardiovascular: Normal rate, regular rhythm, normal heart sounds and intact distal pulses. Exam reveals no gallop and no friction rub.  No murmur heard. Pulmonary/Chest: Effort normal and breath sounds normal. No respiratory distress. She has no wheezes. She has no rales. She exhibits no tenderness.  Possible crackles at base  Abdominal: Soft. Bowel sounds are normal. She exhibits no distension and no mass. There is no tenderness. There is no rebound and no guarding.  Musculoskeletal: Normal range of motion. She exhibits no edema or tenderness.  Lymphadenopathy:    She has no cervical adenopathy.  Neurological: She is alert and oriented to person, place, and time. She has normal reflexes. No cranial nerve deficit. She exhibits normal muscle tone. Coordination normal.  Skin: Skin is warm and dry. No rash noted. She is not diaphoretic. No erythema. No pallor.  Psychiatric: She has a normal mood and affect. Her behavior is normal. Judgment and thought content normal.  Vitals  reviewed.  Vitals:   07/03/17 1336  BP: 134/84  Pulse: 66  SpO2: 94%  Weight: 226 lb 12.8 oz (102.9 kg)  Height: 5' 4" (1.626 m)     Estimated body mass index is 38.93 kg/m as calculated from the following:   Height as of this encounter: 5' 4" (1.626 m).   Weight as of this encounter: 226 lb 12.8 oz (102.9 kg).        Assessment:       ICD-10-CM   1. Mediastinal mass J98.59   2. Dyspnea on exertion R06.09   3. Abnormal CT of the chest R93.89        Plan:     *Mediastinal mass  - definitely keep Dr Lucianne Lei Tright appt 07/05/17 - likely he will recommend resection  Dyspnea on exertion  Abnormal CT of the chest  - it Is likely you have cocomitant interstitial lung disease (ILD) or pulmonary fibrosis  PLAN  - to rule this in or out do next few to several days  - High Resolution CT chest without contrast on ILD protocol. DO both Supine and Prone Images. Only  Dr Lorin Picket or Dr Salvatore Marvel Dr. Vinnie Langton to read.  - Also do 07/03/2017  - Serum: ESR, ACE, ANA, DS-DNA, RF, anti-CCP, ssA, ssB, scl-70, ANCA screen, MPO, PR-3, Total CK,  RNP, Aldolase,  Hypersensitivity Pneumonitis Panel - Also next few to several days - do Pre-bd spiro and dlco only. No lung volume or bd response. No post-bd spiro  - at end of this if we cannot figure out what is driving the ILD, will have to consider surgical lung biopsy same time as dealing with mediastinal mass  Followup 6 weeks or sooner if needed   Dr. Brand Males, M.D., [facility].C.P Pulmonary and Critical Care Medicine Staff Physician, [place] Director - Interstitial Lung Disease  Program  Pulmonary [place] [facility] [place], [place], [address]  Pager: [phone], If no answer or between  15:00h - 7:00h: call [phone] Telephone: [phone]

## 2017-07-03 NOTE — Patient Instructions (Addendum)
Mediastinal mass  - definitely keep Dr Lucianne Lei Tright appt 07/05/17 - likely he will recommend resection  Dyspnea on exertion Abnormal CT of the chest  - it Is likely you have cocomitant interstitial lung disease (ILD) or pulmonary fibrosis  PLAN  - to rule this in or out do next few to several days  - High Resolution CT chest without contrast on ILD protocol. DO both Supine and Prone Images. Only  Dr Lorin Picket or Dr Salvatore Marvel Dr. Vinnie Langton to read.  - Also do 07/03/2017  - Serum: ESR, ACE, ANA, DS-DNA, RF, anti-CCP, ssA, ssB, scl-70, ANCA screen, MPO, PR-3, Total CK,  RNP, Aldolase,  Hypersensitivity Pneumonitis Panel - Also next few to several days - do Pre-bd spiro and dlco only. No lung volume or bd response. No post-bd spiro  - at end of this if we cannot figure out what is driving the ILD, will have to consider surgical lung biopsy same time as dealing with mediastinal mass  Followup 6 weeks or sooner if needed

## 2017-07-05 ENCOUNTER — Other Ambulatory Visit: Payer: Self-pay

## 2017-07-05 ENCOUNTER — Other Ambulatory Visit: Payer: Self-pay | Admitting: *Deleted

## 2017-07-05 ENCOUNTER — Encounter: Payer: Self-pay | Admitting: Cardiothoracic Surgery

## 2017-07-05 ENCOUNTER — Institutional Professional Consult (permissible substitution): Payer: Medicare HMO | Admitting: Cardiothoracic Surgery

## 2017-07-05 VITALS — BP 166/77 | HR 67 | Resp 18 | Ht 64.0 in | Wt 222.0 lb

## 2017-07-05 DIAGNOSIS — J9859 Other diseases of mediastinum, not elsewhere classified: Secondary | ICD-10-CM | POA: Diagnosis not present

## 2017-07-05 LAB — CK TOTAL AND CKMB (NOT AT ARMC)
CK, MB: 0.7 ng/mL (ref 0–5.0)
Relative Index: 1.7 (ref 0–4.0)
Total CK: 41 U/L (ref 29–143)

## 2017-07-05 LAB — ANGIOTENSIN CONVERTING ENZYME: Angiotensin-Converting Enzyme: 58 U/L (ref 9–67)

## 2017-07-05 LAB — RHEUMATOID FACTOR: Rhuematoid fact SerPl-aCnc: <14 IU/mL (ref <14)

## 2017-07-05 LAB — ALDOLASE: Aldolase: 4.6 U/L (ref <=8.1)

## 2017-07-05 NOTE — Progress Notes (Addendum)
PCP is Harlan Stains, MD Referring Provider is Harlan Stains, MD  Chief Complaint  Patient presents with  . Mediastinal Mass    new patient, Chest CT 06/19/2017  Patient examined, CT scan images of chest personally reviewed, demonstrated to patient and counseled with patient  HPI: 72 year old obese female non-smoker presents for evaluation of recently diagnosed 3.5 cm anterior mediastinal tumor detected as an incidental finding when she underwent CT scan of chest for possible interstitial lung disease.  Patient has had symptoms of dyspnea with exertion and dyspnea bending over with some amount of cough.  She is evaluated for interstitial lung disease with a CT scan which showed some basilar parenchymal changes but a fairly large anterior mediastinal mass in the mid mediastinum at the level of the l right atrium anterior to the pericardium.  There is some calcification.  There is some surrounding adenopathy.  It is fairly well circumscribed.  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  The patient is a never smoker and never has a cancer. Incidental finding on CT scan was a 1.5-2 cm left adrenal nodular mass, could be adenoma. Patient has pulmonary function test pending and high-resolution CT scan of lungs to assess interstitial lung disease.  We will proceed with a PET scan as well to assess malignant potential of the anterior mediastinal mass as well as check alpha-fetoprotein level  Patient denies weight loss or difficulty swallowing She has chronic GERD She denies any cardiac history and goes to the [employer] program for exercise Father had history of heart disease and coronary stent Her risk factors for heart disease include hypertension, dyslipidemia, positive family history. She denies diabetes and states she has a tendency towards hypoglycemia. No previous surgical history except for a Cerebral ventricle to peritoneal shunt for hydrocephalus following  subarachnoid hemorrhage almost 20 years ago Past Medical History:  Diagnosis Date  . Carpal tunnel syndrome of right wrist   . Diverticulosis   . GERD (gastroesophageal reflux disease)   . H/O hiatal hernia   . H/O intraventricular hemorrhage 2004--  SPONTANEOUS   S/P SHUNT  . Heart murmur   . Hip pain, right OCCASIONAL -- PINCHED NERVE  . History of gastric ulcer 2001  . Hyperlipidemia   . Hypertension   . Hypoglycemia   . MVP (mitral valve prolapse)   . Normal echocardiogram 30 YRS AGO  . S/P ventriculoperitoneal shunt 2004--- PER PT SHUNT IS WORKING WELL --  NO ISSUES  . Squamous cell cancer of scalp and skin of neck    mole back of head    Past Surgical History:  Procedure Laterality Date  . BILATERAL FRONTAL INTERVENTRICULAR CATHETER VIA TWIST DRILL HOLE  AUG 2004   SPONTANEOUS SUBARACHNOID  HEMORRHAGE AND  INTRAVENTRICULAR HEMORRHAGE  . CARPAL TUNNEL RELEASE  07/26/2011   Procedure: CARPAL TUNNEL RELEASE;  Surgeon: Magnus Sinning, MD;  Location: [facility];  Service: Orthopedics;  Laterality: Right;  . CATARACT EXTRACTION W/ INTRAOCULAR LENS  IMPLANT, BILATERAL  2012   BILATERAL  . DUPUYTREN CONTRACTURE RELEASE  07/26/2011   Procedure: DUPUYTREN CONTRACTURE RELEASE;  Surgeon: Magnus Sinning, MD;  Location: [facility];  Service: Orthopedics;  Laterality: Right;  EXCISION OF DUPYTRENS BAND THUMB- INDEX WEB SPACE  . RETINAL DETACHMENT REPAIR W/ SCLERAL BUCKLE LE  03-10-2011   LEFT EYE  . VENTRICULOPERITONEAL SHUNT  10-08-2002   RIGHT FRONTAL FOR COMMUNICATING HYDROCEPHALUS    Family History  Problem Relation Age of Onset  . Colon cancer Mother 48  . Stomach cancer Maternal Aunt   . Breast cancer Maternal Aunt   . Uterine cancer Maternal Aunt     Social History Social History   Tobacco Use  . Smoking status: Never Smoker  . Smokeless tobacco: Never Used  Substance Use Topics  . Alcohol use: Yes    Alcohol/week: 1.2 oz     Types: 2 Glasses of wine per week    Comment: OCCASIONAL  . Drug use: No    Current Outpatient Medications  Medication Sig Dispense Refill  . amLODipine (NORVASC) 5 MG tablet Take 5 mg by mouth daily.    Marland Kitchen buPROPion (WELLBUTRIN XL) 300 MG 24 hr tablet Take 300 mg by mouth daily.    Marland Kitchen esomeprazole (NEXIUM) 40 MG capsule Take 40 mg by mouth daily at 12 noon.    . hydrochlorothiazide (HYDRODIURIL) 25 MG tablet Take 25 mg by mouth daily.    . metoprolol succinate (TOPROL-XL) 100 MG 24 hr tablet Take 100 mg by mouth daily. Take with or immediately following a meal.    . pravastatin (PRAVACHOL) 80 MG tablet Take 80 mg by mouth daily.     Current Facility-Administered Medications  Medication Dose Route Frequency Provider Last Rate Last Dose  . 0.9 %  sodium chloride infusion  500 mL Intravenous Continuous Pyrtle, Lajuan Lines, MD        Allergies  Allergen Reactions  . Atorvastatin     fatigue  . Carvedilol     Abdominal bloating and palpations  . Claritin [Loratadine]     Blurred vision and dry eyes  . Lisinopril     "drunk feeling"    Review of Systems         Review of Systems :  [ y ] = yes, [  ] = no        General :  Weight gain [   ]    Weight loss  [   ]  Fatigue [  ]  Fever [  ]  Chills  [  ]                                Weakness  [  ]           HEENT    Headache [  ]  Dizziness [  ]  Blurred vision [  ] Glaucoma  [  ]                          Nosebleeds [  ] Painful or loose teeth [  ]        Cardiac :  Chest pain/ pressure [  ]  Resting SOB [  ] exertional SOB [  ]                        Orthopnea [  ]  Pedal edema  [  ]  Palpitations [  ] Syncope/presyncope [ ]                         Paroxysmal nocturnal dyspnea [  ]         Pulmonary : cough [  ]  wheezing [  ]  Hemoptysis [  ] Sputum [  ] Snoring [  ]  Pneumothorax [  ]  Sleep apnea [  ]        GI : Vomiting [  ]  Dysphagia [  ]  Melena  [  ]  Abdominal pain [  ] BRBPR [  ]               Heart burn [  ]  Constipation [  ] Diarrhea  [  ] Colonoscopy [   ]        GU : Hematuria [  ]  Dysuria [  ]  Nocturia [  ] UTI's [  ]        Vascular : Claudication [  ]  Rest pain [  ]  DVT [  ] Vein stripping [  ] leg ulcers [  ]                          TIA [  ] Stroke [  ]  Varicose veins [  ]        NEURO :  Headaches  [  ] Seizures [  ] Vision changes [  ] Paresthesias [  ]                                       Seizures [  ] right-hand-dominant, history of hydrocephalus and subarachnoid hemorrhage yes        Musculoskeletal :  Arthritis [  ] Gout  [  ]  Back pain [  ]  Joint pain [  ]        Skin :  Rash [  ]  Melanoma [  ] Sores [  ]        Heme : Bleeding problems [  ]Clotting Disorders [  ] Anemia [  ]Blood Transfusion [ ]         Endocrine : Diabetes [  ] Heat or Cold intolerance [  ] Polyuria [  ]excessive thirst [ ]         Psych : Depression [  ]  Anxiety [  ]  Psych hospitalizations [  ] Memory change [  ]                                               BP (!) 166/77 (BP Location: Right Arm, Patient Position: Sitting, Cuff Size: Large)   Pulse 67   Resp 18   Ht 5\' 4"  (1.626 m)   Wt 222 lb (100.7 kg)   SpO2 97% Comment: RA  BMI 38.11 kg/m  Physical Exam      Exam    General- alert and comfortable    Neck- no JVD, no cervical adenopathy palpable, no carotid bruit   Lungs- clear without rales, wheezes   Cor- regular rate and rhythm, no murmur , gallop   Abdomen- soft, non-tender   Extremities - warm, non-tender, minimal edema   Neuro- oriented, appropriate, no focal weaknesseakness   Diagnostic Tests: CT scan shows a 3.5 cm round density in the anterior mediastinum containing calcium with some surrounding adenopathy.  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 Patient also being evaluated for interstitial lung disease.  Coronary function tests are pending to  assess her pulmonary reserve for potential tumor  resection which would require sternotomy.  We will also obtain a PET scan to assess the malignant potential of the mediastinal mass. Plan: Return after PFTs and PET scan to further discuss surgery and schedule a date.  The patient may need a combined lung biopsy  at the time of surgery if recommended by her pulmonologist.  Patient will stop taking her omega-3 fish oil tablets to reduce bleeding complications with surgery  Len Childs, MD [facility] [phone]

## 2017-07-06 ENCOUNTER — Ambulatory Visit
Admission: RE | Admit: 2017-07-06 | Discharge: 2017-07-06 | Disposition: A | Discharge status: Home / Self Care | Payer: Medicare HMO | Source: Ambulatory Visit | Attending: Family Medicine | Admitting: Family Medicine

## 2017-07-06 ENCOUNTER — Other Ambulatory Visit: Payer: Self-pay | Admitting: *Deleted

## 2017-07-06 DIAGNOSIS — D3500 Benign neoplasm of unspecified adrenal gland: Secondary | ICD-10-CM | POA: Diagnosis not present

## 2017-07-06 DIAGNOSIS — E278 Other specified disorders of adrenal gland: Secondary | ICD-10-CM

## 2017-07-06 DIAGNOSIS — J9859 Other diseases of mediastinum, not elsewhere classified: Secondary | ICD-10-CM | POA: Diagnosis not present

## 2017-07-06 LAB — HYPERSENSITIVITY PNEUMONITIS
A. Pullulans Abs: NEGATIVE
A.Fumigatus #1 Abs: NEGATIVE
Micropolyspora faeni, IgG: NEGATIVE
Pigeon Serum Abs: NEGATIVE
Thermoact. Saccharii: NEGATIVE
Thermoactinomyces vulgaris, IgG: NEGATIVE

## 2017-07-06 LAB — ANA+ENA+DNA/DS+SCL 70+SJOSSA/B
ANA Titer 1: POSITIVE — AB
ENA RNP Ab: 1 AI — ABNORMAL HIGH (ref 0.0–0.9)
ENA SM Ab Ser-aCnc: 0.2 AI (ref 0.0–0.9)
ENA SSA (RO) Ab: <0.2 AI (ref 0.0–0.9)
ENA SSB (LA) Ab: <0.2 AI (ref 0.0–0.9)
Scleroderma SCL-70: 0.2 AI (ref 0.0–0.9)
dsDNA Ab: <1 IU/mL (ref 0–9)

## 2017-07-06 LAB — FANA STAINING PATTERNS: Speckled Pattern: 1:80 {titer}

## 2017-07-09 ENCOUNTER — Ambulatory Visit (INDEPENDENT_AMBULATORY_CARE_PROVIDER_SITE_OTHER)
Admission: RE | Admit: 2017-07-09 | Discharge: 2017-07-09 | Disposition: A | Discharge status: Home / Self Care | Payer: Medicare HMO | Source: Ambulatory Visit | Attending: Internal Medicine | Admitting: Internal Medicine

## 2017-07-09 DIAGNOSIS — J849 Interstitial pulmonary disease, unspecified: Secondary | ICD-10-CM | POA: Diagnosis not present

## 2017-07-09 DIAGNOSIS — R918 Other nonspecific abnormal finding of lung field: Secondary | ICD-10-CM | POA: Diagnosis not present

## 2017-07-09 LAB — AFP TUMOR MARKER: AFP-Tumor Marker: 4 ng/mL

## 2017-07-11 ENCOUNTER — Telehealth: Payer: Self-pay | Admitting: Internal Medicine

## 2017-07-11 ENCOUNTER — Ambulatory Visit (INDEPENDENT_AMBULATORY_CARE_PROVIDER_SITE_OTHER): Payer: Medicare HMO | Admitting: Internal Medicine

## 2017-07-11 DIAGNOSIS — J849 Interstitial pulmonary disease, unspecified: Secondary | ICD-10-CM | POA: Diagnosis not present

## 2017-07-11 LAB — PULMONARY FUNCTION TEST
DL/VA % pred: 108 %
DL/VA: 5.22 ml/min/mmHg/L
DLCO unc % pred: 80 %
DLCO unc: 19.55 ml/min/mmHg
FEF 25-75 Pre: 2.24 L/sec
FEF2575-%Pred-Pre: 125 %
FEV1-%Pred-Pre: 83 %
FEV1-Pre: 1.83 L
FEV1FVC-%Pred-Pre: 113 %
FEV6-%Pred-Pre: 77 %
FEV6-Pre: 2.14 L
FEV6FVC-%Pred-Pre: 104 %
FVC-%Pred-Pre: 73 %
FVC-Pre: 2.14 L
Pre FEV1/FVC ratio: 85 %
Pre FEV6/FVC Ratio: 100 %

## 2017-07-11 NOTE — Telephone Encounter (Signed)
Called and spoke with pt letting her know the results of the labwork as well as the CT scan and per MR, recommendation was for Dr. Prescott Gum to consider a surgical lung biopsy at the time of the mass resection.  Pt expressed understanding and stated she was fine with the lung biopsy being performed at the time as the mass resection.  MR, please advise if you want [place] to go ahead and place the order for a surgical lung biopsy to be performed. Pt also had PFT performed today and is wanting to know the results of that.

## 2017-07-11 NOTE — Progress Notes (Signed)
PFT completed today.  

## 2017-07-11 NOTE — Telephone Encounter (Signed)
Alison Ayala  Let Alison Ayala Kook know that after blood work and repeat CT chest - cause for ILD is not know and I have recommended to Dr Patric Dykes he consider surgical lung biopsy at time of mediastinal mass resection. Let me know her response - after you are done. Please send note back  Thanks  Dr. Brand Males, M.D., [facility].C.P Pulmonary and Critical Care Medicine Staff Physician, [place] Director - Interstitial Lung Disease  Program  Pulmonary [place] at [place], [place], [address]  Pager: [phone], If no answer or between  15:00h - 7:00h: call [phone] Telephone: [phone]       Ct Chest High Resolution  Result Date: 07/09/2017 CLINICAL DATA:  Possible interstitial lung disease on recent CT chest. EXAM: CT CHEST WITHOUT CONTRAST TECHNIQUE: Multidetector CT imaging of the chest was performed following the standard protocol without intravenous contrast. High resolution imaging of the lungs, as well as inspiratory and expiratory imaging, was performed. COMPARISON:  06/19/2017. FINDINGS: Cardiovascular: Atherosclerotic calcification of the arterial vasculature. Heart is enlarged. No pericardial effusion. Mediastinum/Nodes: Mediastinal lymph nodes measure up to 9 mm in the prevascular space. Soft tissue mass with internal coarse calcification in the low right prevascular space measures 3.0 x 4.6 cm. Hilar regions are difficult to evaluate without IV contrast. No axillary adenopathy. Esophagus is grossly unremarkable. Tiny hiatal hernia. Lungs/Pleura: Mild basilar predominant patchy and subpleural ground-glass with interlobular and intra lobular septal thickening. There may be mild bronchiectasis without definite traction. No architectural distortion or honeycombing. Findings persist on inspiratory prone images. There is air trapping. No pleural fluid. Airway is unremarkable. Upper Abdomen: Visualized portions of the  liver, gallbladder and adrenal glands are unremarkable. Fluid density nodule in the left adrenal gland measures 2.4 cm. Visualized portions of the kidneys, spleen, pancreas, stomach and bowel are grossly unremarkable with exception of a tiny hiatal hernia. No upper abdominal adenopathy. Musculoskeletal: Degenerative changes in the spine. No worrisome lytic or sclerotic lesions. Healing or healed left rib fractures. IMPRESSION: 1. Basilar predominant patchy and subpleural ground-glass with interlobular/intra lobular septal thickening and air trapping. By ATS Fleischner criteria, findings are not indicative of UIP but suggest an alternate diagnosis such as mild chronic hypersensitivity pneumonitis or nonspecific interstitial pneumonitis. 2. Soft tissue mass with coarse calcification in the prevascular space with adjacent mediastinal nodularity. Diagnostic considerations include a germ-cell tumor or thymic neoplasm, as on 06/19/2017. 3. Left adrenal adenoma. 4.  Aortic atherosclerosis (ICD10-170.0). Electronically Signed   By: Lorin Picket M.D.   On: 07/09/2017 13:20     Results for JONASIA, COINER (MRN [id number]) as of 07/11/2017 05:26  Ref. Range 07/03/2017 14:53  Aldolase Latest Ref Range: < OR = 8.1 U/L 4.6  Sed Rate Latest Ref Range: 0 - 30 mm/hr 16  ANA Titer 1 Unknown Positive (A)  Angiotensin-Converting Enzyme Latest Ref Range: 9 - 67 U/L 58  dsDNA Ab Latest Ref Range: 0 - 9 IU/mL <1  ENA RNP Ab Latest Ref Range: 0.0 - 0.9 AI 1.0 (H)  ENA SSA (RO) Ab Latest Ref Range: 0.0 - 0.9 AI <0.2  ENA SSB (LA) Ab Latest Ref Range: 0.0 - 0.9 AI <0.2  RA Latex Turbid. Latest Ref Range: <14 IU/mL <14  Scleroderma SCL-70 Latest Ref Range: 0.0 - 0.9 AI 0.2  ENA SM Ab Ser-aCnc Latest Ref Range: 0.0 - 0.9 AI 0.2  Speckled Pattern Unknown 1:80  NOTE:  Unknown Comment  A.Fumigatus #1 Abs Latest Ref Range: Negative  Negative  Micropolyspora faeni, IgG Latest Ref Range: Negative  Negative  Thermoactinomyces  vulgaris, IgG Latest Ref Range: Negative  Negative  A. Pullulans Abs Latest Ref Range: Negative  Negative  Thermoact. Saccharii Latest Ref Range: Negative  Negative  Pigeon Serum Abs Latest Ref Range: Negative  Negative

## 2017-07-11 NOTE — Telephone Encounter (Signed)
Hi [name] will be seeing Alison Ayala on 6/132/19 after PET scan for her mediastinal mass. She also has ILD - . I have updates about her ILD - it is is indeterminate baesd on CT and near negative serology. Therefore, she will need surgical lung biopsy at that time you plan for resection.   Also, I might need bronchoscopy with transbronchial biopsy samples after before you do conventional surgical lung biopsy for ILD - this is for the BRAVE study. The results are publishe din [facility] this year- that this technique can predict UIP pathology (not others) using genomic classifier. But spnsor wants some more research samples.   I will personally call you next few days  Thanks  Dr. Brand Males, M.D., [facility].C.P Pulmonary and Critical Care Medicine Staff Physician, [place] Director - Interstitial Lung Disease  Program  Pulmonary [place] at [place], [place], [address]  Pager: [phone], If no answer or between  15:00h - 7:00h: call [phone] Telephone: [phone]

## 2017-07-12 NOTE — Telephone Encounter (Signed)
Called and spoke to [facility]  1. PFt results- mild reduction in FVC 2. Communicated- need for ILD based surgical lung biopsy - she agreed 3. Discussed BRAVE study - genomic based dx of ILD using transbronchial biopsy through research protocol in same setting just before surgical lung bx - she is agreeable and open to this. Explained will need to get research team on board and Dr Nils Pyle on board   Dr. Brand Males, M.D., [facility].C.P Pulmonary and Critical Care Medicine Staff Physician, [place] Director - Interstitial Lung Disease  Program  Pulmonary [place] at [place], [place], [address]  Pager: [phone], If no answer or between  15:00h - 7:00h: call [phone] Telephone: [phone]

## 2017-07-16 ENCOUNTER — Ambulatory Visit (HOSPITAL_COMMUNITY)
Admission: RE | Admit: 2017-07-16 | Discharge: 2017-07-16 | Disposition: A | Discharge status: Home / Self Care | Payer: Medicare HMO | Source: Ambulatory Visit | Attending: Cardiothoracic Surgery | Admitting: Cardiothoracic Surgery

## 2017-07-16 DIAGNOSIS — R918 Other nonspecific abnormal finding of lung field: Secondary | ICD-10-CM | POA: Diagnosis not present

## 2017-07-16 DIAGNOSIS — Z79899 Other long term (current) drug therapy: Secondary | ICD-10-CM | POA: Insufficient documentation

## 2017-07-16 DIAGNOSIS — J9859 Other diseases of mediastinum, not elsewhere classified: Secondary | ICD-10-CM | POA: Diagnosis not present

## 2017-07-16 DIAGNOSIS — D3502 Benign neoplasm of left adrenal gland: Secondary | ICD-10-CM | POA: Insufficient documentation

## 2017-07-16 LAB — GLUCOSE, CAPILLARY: Glucose-Capillary: 127 mg/dL — ABNORMAL HIGH (ref 65–99)

## 2017-07-16 MED ORDER — FLUDEOXYGLUCOSE F - 18 (FDG) INJECTION: 10.7000 | Freq: Once | INTRAVENOUS | Status: DC | PRN

## 2017-07-18 ENCOUNTER — Encounter: Payer: Self-pay | Admitting: Cardiothoracic Surgery

## 2017-07-18 ENCOUNTER — Ambulatory Visit: Payer: Medicare HMO | Admitting: Cardiothoracic Surgery

## 2017-07-18 ENCOUNTER — Other Ambulatory Visit: Payer: Self-pay

## 2017-07-18 ENCOUNTER — Other Ambulatory Visit: Payer: Self-pay | Admitting: *Deleted

## 2017-07-18 VITALS — BP 166/76 | HR 72 | Resp 16 | Ht 64.0 in | Wt 222.0 lb

## 2017-07-18 DIAGNOSIS — J9859 Other diseases of mediastinum, not elsewhere classified: Secondary | ICD-10-CM | POA: Diagnosis not present

## 2017-07-18 DIAGNOSIS — J849 Interstitial pulmonary disease, unspecified: Secondary | ICD-10-CM

## 2017-07-18 NOTE — Progress Notes (Signed)
PCP is Harlan Stains, MD Referring Provider is Harlan Stains, MD  Chief Complaint  Patient presents with  . 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  This was an incidental finding on CT scan to assess for interstitial lung disease-pulmonary fibrosis.  PET scan shows the mediastinal mass to have a activity of 4.5 units consistent with a slow-growing thymic tumor.  No evidence of metastatic disease.  The adrenal tumor noted on the original CT scan shows mild activity consistent with a adrenal adenoma.  The patient is recommended to have the thymic tumor removed which would require sternotomy.  The patient's pulmonologist Dr. Chase Caller requests a lung biopsy to document the clinical diagnosis of possible interstitial lung disease, which is appropriate.  I discussed both procedures with the patient and her husband.  We will perform the sternotomy, resect the mediastinal tumor then open the right pleura for open biopsies of the right lung.  Dr. Chase Caller is recommended video bronchoscopy with endobronchial biopsies to assess for interstitial lung disease after the patient is asleep but prior to open chest surgery.  The patient's PFTs show adequate FEV1 and FVC and diffusion capacity to tolerate sternotomy. Patient has hypertension, hyperlipidemia and positive history of coronary disease-myocardial infarction in her father.  She will need cardiology clearance prior to sternotomy and resection of mediastinal mass and lung biopsy. The patient is Interested in getting the surgery done as soon as possible.  We will tentatively schedule the date of surgery on June 28 assuming that her cardiology assessment can be completed before then.  Past medical history  Patient has history of Mitral valve prolapse from a remote echo which is not in the system  Patient has history  of a ventricular- peritoneal shunt following subarachnoid hemorrhage and a lengthy hospitalization 15 years ago.  The shunt is on the right side of the neck and right anterior chest wall and into the right upper abdominal cavity.  Past Medical History:  Diagnosis Date  . Carpal tunnel syndrome of right wrist   . Diverticulosis   . GERD (gastroesophageal reflux disease)   . H/O hiatal hernia   . H/O intraventricular hemorrhage 2004--  SPONTANEOUS   S/P SHUNT  . Heart murmur   . Hip pain, right OCCASIONAL -- PINCHED NERVE  . History of gastric ulcer 2001  . Hyperlipidemia   . Hypertension   . Hypoglycemia   . MVP (mitral valve prolapse)   . Normal echocardiogram 30 YRS AGO  . S/P ventriculoperitoneal shunt 2004--- PER PT SHUNT IS WORKING WELL --  NO ISSUES  . Squamous cell cancer of scalp and skin of neck    mole back of head    Past Surgical History:  Procedure Laterality Date  . BILATERAL FRONTAL INTERVENTRICULAR CATHETER VIA TWIST DRILL HOLE  AUG 2004   SPONTANEOUS SUBARACHNOID  HEMORRHAGE AND  INTRAVENTRICULAR HEMORRHAGE  . CARPAL TUNNEL RELEASE  07/26/2011   Procedure: CARPAL TUNNEL RELEASE;  Surgeon: Magnus Sinning, MD;  Location: [facility];  Service: Orthopedics;  Laterality: Right;  . CATARACT EXTRACTION W/ INTRAOCULAR LENS  IMPLANT, BILATERAL  2012   BILATERAL  . DUPUYTREN CONTRACTURE RELEASE  07/26/2011   Procedure: DUPUYTREN CONTRACTURE RELEASE;  Surgeon: Magnus Sinning, MD;  Location: [facility];  Service: Orthopedics;  Laterality: Right;  EXCISION OF DUPYTRENS BAND THUMB- INDEX  WEB SPACE  . RETINAL DETACHMENT REPAIR W/ SCLERAL BUCKLE LE  03-10-2011   LEFT EYE  . VENTRICULOPERITONEAL SHUNT  10-08-2002   RIGHT FRONTAL FOR COMMUNICATING HYDROCEPHALUS    Family History  Problem Relation Age of Onset  . Colon cancer Mother 39  . Stomach cancer Maternal Aunt   . Breast cancer Maternal Aunt   . Uterine cancer Maternal Aunt      Social History Social History   Tobacco Use  . Smoking status: Never Smoker  . Smokeless tobacco: Never Used  Substance Use Topics  . Alcohol use: Yes    Alcohol/week: 1.2 oz    Types: 2 Glasses of wine per week    Comment: OCCASIONAL  . Drug use: No    Current Outpatient Medications  Medication Sig Dispense Refill  . amLODipine (NORVASC) 5 MG tablet Take 5 mg by mouth daily.    Marland Kitchen buPROPion (WELLBUTRIN XL) 300 MG 24 hr tablet Take 300 mg by mouth daily.    Marland Kitchen esomeprazole (NEXIUM) 40 MG capsule Take 40 mg by mouth daily at 12 noon.    . hydrochlorothiazide (HYDRODIURIL) 25 MG tablet Take 25 mg by mouth daily.    . metoprolol succinate (TOPROL-XL) 100 MG 24 hr tablet Take 100 mg by mouth daily. Take with or immediately following a meal.    . pravastatin (PRAVACHOL) 80 MG tablet Take 80 mg by mouth daily.     Current Facility-Administered Medications  Medication Dose Route Frequency Provider Last Rate Last Dose  . 0.9 %  sodium chloride infusion  500 mL Intravenous Continuous Pyrtle, Lajuan Lines, MD       Facility-Administered Medications Ordered in Other Visits  Medication Dose Route Frequency Provider Last Rate Last Dose  . fludeoxyglucose F - 18 (FDG) injection 08.6 millicurie  57.8 millicurie Intravenous Once PRN Michiel Cowboy, MD        Allergies  Allergen Reactions  . Atorvastatin     fatigue  . Carvedilol     Abdominal bloating and palpations  . Claritin [Loratadine]     Blurred vision and dry eyes  . Lisinopril     "drunk feeling"    Review of Systems  No change from previous review during original consultation earlier this month  BP (!) 166/76 (BP Location: Right Arm, Patient Position: Sitting, Cuff Size: Large)   Pulse 72   Resp 16   Ht 5\' 4"  (1.626 m)   Wt 222 lb (100.7 kg)   SpO2 96% Comment: ON RA  BMI 38.11 kg/m  Physical Exam      Exam    General- alert and comfortable    Neck- no JVD, no cervical adenopathy palpable, no carotid bruit    Lungs- clear without rales, wheezes   Cor- regular rate and rhythm, no murmur , gallop   Abdomen- soft, non-tender   Extremities - warm, non-tender, minimal edema   Neuro- oriented, appropriate, no focal weakness   Diagnostic Tests: PET scan images personally reviewed and counseled with patient showing a 4 cm anterior mediastinal mass with metabolic activity SUV 4.2  Impression: Probable thymic tumor which needs resection.  Probable interstitial lung disease followed by pulmonology Dr. Oliva Bustard needs to be documented with open lung biopsy. Plan: Sternotomy with video bronchoscopy and transbronchial lung biopsy scheduled at [facility] on June 28.  Procedure indications benefits alternatives and risks have been discussed in detail with the patient and her husband.  She will have a cardiology clearance set up prior to surgery.  Len Childs, MD [facility] [phone]

## 2017-07-19 ENCOUNTER — Ambulatory Visit: Payer: Medicare HMO | Admitting: Cardiology

## 2017-07-23 ENCOUNTER — Encounter: Payer: Self-pay | Admitting: Cardiovascular Disease

## 2017-07-23 ENCOUNTER — Ambulatory Visit: Payer: Medicare HMO | Admitting: Cardiovascular Disease

## 2017-07-23 VITALS — BP 152/76 | HR 72 | Ht 64.0 in | Wt 225.4 lb

## 2017-07-23 DIAGNOSIS — Z01810 Encounter for preprocedural cardiovascular examination: Secondary | ICD-10-CM | POA: Diagnosis not present

## 2017-07-23 DIAGNOSIS — I10 Essential (primary) hypertension: Secondary | ICD-10-CM | POA: Diagnosis not present

## 2017-07-23 DIAGNOSIS — R0602 Shortness of breath: Secondary | ICD-10-CM

## 2017-07-23 NOTE — Progress Notes (Signed)
Cardiology Office Note:    Date:  07/23/2017   ID:  Alison Ayala, DOB November 22, 1945, MRN [id number]  PCP:  Harlan Stains, MD  Cardiologist:  Acie Fredrickson   Referring MD: Harlan Stains, MD   Chief Complaint  Patient presents with  . Pre-op Exam  . Mitral Valve Prolapse     Problem list 1.  Hypertension 2.  Hyperlipidemia 3.  Mitral valve prolapse 4.  Mediastinal mass  History of Present Illness:    Alison Ayala is a 72 y.o. female with a hx of  MVP and a family hx of CAD. 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  She is never had any serious cardiac issues.  She has had heart murmur and has been diagnosed with mitral valve prolapse.  Shes been exercising at the gym for the past several years.   No CP or dyspnea.  Gets tired more easily over the past several weeks.   Needs to have her mediastinal mass removed and a lung biopsy.   Her father has a hx of CAD      Past Medical History:  Diagnosis Date  . Carpal tunnel syndrome of right wrist   . Diverticulosis   . GERD (gastroesophageal reflux disease)   . H/O hiatal hernia   . H/O intraventricular hemorrhage 2004--  SPONTANEOUS   S/P SHUNT  . Heart murmur   . Hip pain, right OCCASIONAL -- PINCHED NERVE  . History of gastric ulcer 2001  . Hyperlipidemia   . Hypertension   . Hypoglycemia   . MVP (mitral valve prolapse)   . Normal echocardiogram 30 YRS AGO  . S/P ventriculoperitoneal shunt 2004--- PER PT SHUNT IS WORKING WELL --  NO ISSUES  . Squamous cell cancer of scalp and skin of neck    mole back of head    Past Surgical History:  Procedure Laterality Date  . BILATERAL FRONTAL INTERVENTRICULAR CATHETER VIA TWIST DRILL HOLE  AUG 2004   SPONTANEOUS SUBARACHNOID  HEMORRHAGE AND  INTRAVENTRICULAR HEMORRHAGE  . CARPAL TUNNEL RELEASE  07/26/2011   Procedure: CARPAL TUNNEL RELEASE;  Surgeon: Magnus Sinning, MD;  Location: [facility];  Service:  Orthopedics;  Laterality: Right;  . CATARACT EXTRACTION W/ INTRAOCULAR LENS  IMPLANT, BILATERAL  2012   BILATERAL  . DUPUYTREN CONTRACTURE RELEASE  07/26/2011   Procedure: DUPUYTREN CONTRACTURE RELEASE;  Surgeon: Magnus Sinning, MD;  Location: [facility];  Service: Orthopedics;  Laterality: Right;  EXCISION OF DUPYTRENS BAND THUMB- INDEX WEB SPACE  . RETINAL DETACHMENT REPAIR W/ SCLERAL BUCKLE LE  03-10-2011   LEFT EYE  . VENTRICULOPERITONEAL SHUNT  10-08-2002   RIGHT FRONTAL FOR COMMUNICATING HYDROCEPHALUS    Current Medications: Current Meds  Medication Sig  . amLODipine (NORVASC) 5 MG tablet Take 5 mg by mouth daily.  Marland Kitchen buPROPion (WELLBUTRIN XL) 300 MG 24 hr tablet Take 300 mg by mouth daily.  Marland Kitchen esomeprazole (NEXIUM) 40 MG capsule Take 40 mg by mouth daily at 12 noon.  . hydrochlorothiazide (HYDRODIURIL) 25 MG tablet Take 25 mg by mouth daily.  . metoprolol succinate (TOPROL-XL) 100 MG 24 hr tablet Take 100 mg by mouth daily. Take with or immediately following a meal.  . pravastatin (PRAVACHOL) 80 MG tablet Take 80 mg by mouth daily.   Current Facility-Administered Medications for the 07/23/17 encounter (Office Visit) with Peggi Yono, Wonda Cheng, MD  Medication  . 0.9 %  sodium chloride infusion  Allergies:   Atorvastatin; Carvedilol; Claritin [loratadine]; and Lisinopril   Social History   Socioeconomic History  . Marital status: Married    Spouse name: Not on file  . Number of children: Not on file  . Years of education: Not on file  . Highest education level: Not on file  Occupational History  . Not on file  Social Needs  . Financial resource strain: Not on file  . Food insecurity:    Worry: Not on file    Inability: Not on file  . Transportation needs:    Medical: Not on file    Non-medical: Not on file  Tobacco Use  . Smoking status: Never Smoker  . Smokeless tobacco: Never Used  Substance and Sexual Activity  . Alcohol use: Yes     Alcohol/week: 1.2 oz    Types: 2 Glasses of wine per week    Comment: OCCASIONAL  . Drug use: No  . Sexual activity: Not on file  Lifestyle  . Physical activity:    Days per week: Not on file    Minutes per session: Not on file  . Stress: Not on file  Relationships  . Social connections:    Talks on phone: Not on file    Gets together: Not on file    Attends religious service: Not on file    Active member of club or organization: Not on file    Attends meetings of clubs or organizations: Not on file    Relationship status: Not on file  Other Topics Concern  . Not on file  Social History Narrative  . Not on file     Family History: The patient's family history includes Breast cancer in her maternal aunt; Colon cancer (age of onset: 29) in her mother; Stomach cancer in her maternal aunt; Uterine cancer in her maternal aunt.  ROS:   Please see the history of present illness.     All other systems reviewed and are negative.  EKGs/Labs/Other Studies Reviewed:    The following studies were reviewed today:   EKG:  July 23, 2017:    NSR with 1st degree AV block . hhr 72   Recent Labs: No results found for requested labs within last 8760 hours.  Recent Lipid Panel No results found for: CHOL, TRIG, HDL, CHOLHDL, VLDL, LDLCALC, LDLDIRECT  Physical Exam:    VS:  BP (!) 152/76   Pulse 72   Ht 5\' 4"  (1.626 m)   Wt 225 lb 6.4 oz (102.2 kg)   BMI 38.69 kg/m     Wt Readings from Last 3 Encounters:  07/23/17 225 lb 6.4 oz (102.2 kg)  07/18/17 222 lb (100.7 kg)  07/05/17 222 lb (100.7 kg)     GEN:   Middle-aged female, no acute distress HEENT: Normal NECK: No JVD; No carotid bruits LYMPHATICS: No lymphadenopathy CARDIAC: RRR, no murmurs,  RESPIRATORY:  Clear to auscultation without rales, wheezing or rhonchi  ABDOMEN: Soft, non-tender, non-distended MUSCULOSKELETAL:  No edema; No deformity  SKIN: Warm and dry NEUROLOGIC:  Alert and oriented x 3 PSYCHIATRIC:  Normal  affect   ASSESSMENT:    1. Preop cardiovascular exam   2. Essential hypertension   3. SOB (shortness of breath)    PLAN:    In order of problems listed above:  1. Preop evaluation: [name] presents for preoperative evaluation prior to having thoracic surgery.  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  She has a history of  mitral valve prolapse from the remote past and a family history of coronary artery disease.  She is exercised regularly at [employer] up until several weeks ago.  She is had a little bit more shortness of breath since that time.  She thinks it may be due to anxiety from her recent diagnosis of this mediastinal mass.  We will get an echo to evaluate her left ventricular function and evaluate her  mitral valve.  I do not hear any significant murmur or mitral valve click. We will also get an exercise treadmill test.  Assuming that these do not show any significant abnormalities, she will be at low risk for her upcoming surgery.  I will see her on an as-needed basis.   Medication Adjustments/Labs and Tests Ordered: Current medicines are reviewed at length with the patient today.  Concerns regarding medicines are outlined above.  Orders Placed This Encounter  Procedures  . Exercise Tolerance Test  . EKG 12-Lead  . ECHOCARDIOGRAM COMPLETE   No orders of the defined types were placed in this encounter.   Patient Instructions  Medication Instructions:  Your physician recommends that you continue on your current medications as directed. Please refer to the Current Medication list given to you today.   Labwork: None Ordered   Testing/Procedures: Your physician has requested that you have an exercise tolerance test. For further information please visit [facility]uge[facility]iest[facility]tn. Please also follow instruction sheet, as given.  Your physician has requested that you have an echocardiogram. Echocardiography is a painless test that uses sound waves to  create images of your heart. It provides your doctor with information about the size and shape of your heart and how well your heart's chambers and valves are working. This procedure takes approximately one hour. There are no restrictions for this procedure.   Follow-Up: Your physician recommends that you schedule a follow-up appointment in: as needed with Dr. Acie Fredrickson    If you need a refill on your cardiac medications before your next appointment, please call your pharmacy.   Thank you for choosing [facility]! Christen Bame, RN [phone]       Signed, Mertie Moores, MD  07/23/2017 8:14 AM    [place]

## 2017-07-23 NOTE — Patient Instructions (Signed)
Medication Instructions:  Your physician recommends that you continue on your current medications as directed. Please refer to the Current Medication list given to you today.   Labwork: None Ordered   Testing/Procedures: Your physician has requested that you have an exercise tolerance test. For further information please visit [employer]tn. Please also follow instruction sheet, as given.  Your physician has requested that you have an echocardiogram. Echocardiography is a painless test that uses sound waves to create images of your heart. It provides your doctor with information about the size and shape of your heart and how well your heart's chambers and valves are working. This procedure takes approximately one hour. There are no restrictions for this procedure.   Follow-Up: Your physician recommends that you schedule a follow-up appointment in: as needed with Dr. Acie Fredrickson    If you need a refill on your cardiac medications before your next appointment, please call your pharmacy.   Thank you for choosing [facility]! Christen Bame, RN [phone]

## 2017-07-24 ENCOUNTER — Telehealth: Payer: Self-pay | Admitting: Internal Medicine

## 2017-07-24 NOTE — Telephone Encounter (Signed)
Will route message to Dr. Chase Caller so that he may contact the pt back.

## 2017-07-25 ENCOUNTER — Ambulatory Visit (INDEPENDENT_AMBULATORY_CARE_PROVIDER_SITE_OTHER): Payer: Medicare HMO

## 2017-07-25 ENCOUNTER — Ambulatory Visit (HOSPITAL_COMMUNITY): Payer: Medicare HMO | Attending: Cardiology

## 2017-07-25 ENCOUNTER — Other Ambulatory Visit: Payer: Self-pay

## 2017-07-25 ENCOUNTER — Telehealth: Payer: Self-pay | Admitting: Internal Medicine

## 2017-07-25 DIAGNOSIS — R0602 Shortness of breath: Secondary | ICD-10-CM | POA: Insufficient documentation

## 2017-07-25 DIAGNOSIS — I083 Combined rheumatic disorders of mitral, aortic and tricuspid valves: Secondary | ICD-10-CM | POA: Diagnosis not present

## 2017-07-25 DIAGNOSIS — Z01810 Encounter for preprocedural cardiovascular examination: Secondary | ICD-10-CM | POA: Diagnosis not present

## 2017-07-25 LAB — EXERCISE TOLERANCE TEST
Estimated workload: 4.8 METS
Exercise duration (min): 3 min
Exercise duration (sec): 12 s
MPHR: 148 {beats}/min
Peak HR: 114 {beats}/min
Percent HR: 77 %
RPE: 17
Rest HR: 65 {beats}/min

## 2017-07-25 NOTE — Telephone Encounter (Signed)
Alison Ayala  1. Let Doyce Para know that I dw/ radiologist Dr Rosario Jacks and defnitely the lung findings are NOT related to rib injury  2. Please work with Rosana Berger and her schedule and get patient into a research slot between Monday June 24- Wed 26th June - so Chitra and I can consent for BRAVE study when she goes for procedure on 28th June with Dr Nils Pyle. THe timing has to work for patient and for [employer] Patient has agreed to be in study  Thanks  Dr. Brand Males, M.D., [facility].C.P Pulmonary and Critical Care Medicine Staff Physician, [place] Director - Interstitial Lung Disease  Program  Pulmonary [place] at [place], [place], [address]  Pager: [phone], If no answer or between  15:00h - 7:00h: call [phone] Telephone: [phone]

## 2017-07-25 NOTE — Telephone Encounter (Signed)
Called Alison Ayala 11:05 AM 07/25/2017   Discussed Brave study . She got ICF yesterday and will go over it. She is willing to come in specifically to consent next week. Discussed study aim, and method and potential for risks (very low additional risk)   1. Scientific Purpose  Clinical research is designed to produce generalizable knowledge and to answer questions about the safety and efficacy of intervention(s) under study in order to determine whether or not they may be useful for the care of future patients.  2. Study Procedures  Participation in a trial may involve procedures or tests, in addition to the intervention(s) under study, that are intended only or primarily to generate scientific knowledge and that are otherwise not necessary for patient care.   3. Uncertainty  For intervention(s) under study in clinical research, there often is less knowledge and more uncertainty about the risks and benefits to a population of trial participants than there is when a doctor offers a patient standard interventions.   4. Adherence to Protocol  Administration of the intervention(s) under study is typically based on a strict protocol with defined dose, scheduling, and use or avoidance of concurrent medications, compared to administration of standard interventions.  5. Clinician as Investigator  Clinicians who are in health care settings provide treatment; in a clinical trial setting, they are also investigating safety and efficacy of an intervention. In otherwise your doctor or nurse practitioner can be wearing 2 hats - one as care giver another as Company secretary  6. Patient as Visual merchandiser Subject  Patients participating in research trials are research subjects or volunteers. In other words participating in research is 100% voluntary and at one's own free weill. The decision to participate or not participate will NOT affect patient care and the doctor-patient relationship in  any way    Dr. Brand Males, M.D., [facility].C.P Pulmonary and Critical Care Medicine Staff Physician, [place] Director - Interstitial Lung Disease  Program  Pulmonary [place] at [place], [place], [address]  Pager: [phone], If no answer or between  15:00h - 7:00h: call [phone] Telephone: [phone]

## 2017-07-25 NOTE — Telephone Encounter (Signed)
I can seee her in clinic side A/B on level 2 during hte research slot. That is probably best. Chitra and I can meet her there. Is a research visit. Chitra will need to get patient on epic calendar. No co pay

## 2017-07-25 NOTE — Telephone Encounter (Signed)
Called and spoke with pt letting her know the information from MR after he discussed with Dr. Rosario Jacks. Pt expressed understanding.  Also asked pt if she could come in next Tuesday, June 25 at 10:30 for the [facility] visit.  Pt stated she could do that.  I have made Chitra aware that pt will be coming Tuesday, June 25 at 10:30 that she needs to let pt know if she needs to come up to pulmonary for the visit or if she needs to just head directly down to [facility].  Stated to [place] if she needed to, discuss with MR, Anderson Malta, or Lysbeth Galas to find out where pt needs to come next week for the visit.  Will route this message to Alysia Penna, and MR so that way they can be able to discuss with Chitra and help get pt added on MR's schedule in the research slot on Tuesday, 6/24 and also so that way pt can go to the correct place for the visit.

## 2017-07-26 NOTE — Telephone Encounter (Signed)
Pt's appt has been scheduled in the research slot on 6/25.  Nothing further needed.

## 2017-07-31 ENCOUNTER — Encounter: Payer: Medicare HMO | Admitting: Internal Medicine

## 2017-07-31 NOTE — Pre-Procedure Instructions (Signed)
Alison Ayala  07/31/2017      [id number] - [place], [place] - [address]  [address] Phone: [phone] Fax: [phone]    Your procedure is scheduled on Tues., August 07, 2017 from 10:32AM-3:32PM  Report to [facility] Admitting Entrance "A" at 8:30AM  Call this number if you have problems the morning of surgery:  [phone]   Remember:  Do not eat or drink after midnight on July 1st    Take these medicines the morning of surgery with A SIP OF WATER: AmLODipine (NORVASC), BuPROPion (WELLBUTRIN XL), Esomeprazole (NEXIUM), and Metoprolol succinate (TOPROL-XL). If needed Acetaminophen (TYLENOL) and Eye drops  As of today, stop taking all Other Aspirin Products, Vitamins, Fish oils, and Herbal medications. Also stop all NSAIDS i.e. Advil, Ibuprofen, Motrin, Aleve, Anaprox, Naproxen, BC, Goody Powders, and all Supplements.    Do not wear jewelry, make-up or nail polish.  Do not wear lotions, powders, or perfumes, or deodorant.  Do not shave 48 hours prior to surgery.    Do not bring valuables to the hospital.  [facility] is not responsible for any belongings or valuables.  Contacts, dentures or bridgework may not be worn into surgery.  Leave your suitcase in the car.  After surgery it may be brought to your room.  For patients admitted to the hospital, discharge time will be determined by your treatment team.  Patients discharged the day of surgery will not be allowed to drive home.   Special instructions:  [place]- Preparing For Surgery  Before surgery, you can play an important role. Because skin is not sterile, your skin needs to be as free of germs as possible. You can reduce the number of germs on your skin by washing with CHG (chlorahexidine gluconate) Soap before surgery.  CHG is an antiseptic cleaner which kills germs and bonds with the skin to continue killing germs even after washing.    Oral Hygiene is  also important to reduce your risk of infection.  Remember - BRUSH YOUR TEETH THE MORNING OF SURGERY WITH YOUR REGULAR TOOTHPASTE  Please do not use if you have an allergy to CHG or antibacterial soaps. If your skin becomes reddened/irritated stop using the CHG.  Do not shave (including legs and underarms) for at least 48 hours prior to first CHG shower. It is OK to shave your face.  Please follow these instructions carefully.   1. Shower the NIGHT BEFORE SURGERY and the MORNING OF SURGERY with CHG.   2. If you chose to wash your hair, wash your hair first as usual with your normal shampoo.  3. After you shampoo, rinse your hair and body thoroughly to remove the shampoo.  4. Use CHG as you would any other liquid soap. You can apply CHG directly to the skin and wash gently with a scrungie or a clean washcloth.   5. Apply the CHG Soap to your body ONLY FROM THE NECK DOWN.  Do not use on open wounds or open sores. Avoid contact with your eyes, ears, mouth and genitals (private parts). Wash Face and genitals (private parts)  with your normal soap.  6. Wash thoroughly, paying special attention to the area where your surgery will be performed.  7. Thoroughly rinse your body with warm water from the neck down.  8. DO NOT shower/wash with your normal soap after using and rinsing off the CHG Soap.  9. Pat yourself dry with a CLEAN TOWEL.  10. Wear CLEAN PAJAMAS to bed the night before surgery, wear comfortable clothes the morning of surgery  11. Place CLEAN SHEETS on your bed the night of your first shower and DO NOT SLEEP WITH PETS.  Day of Surgery:  Do not apply any deodorants/lotions.  Please wear clean clothes to the hospital/surgery center.   Remember to brush your teeth WITH YOUR REGULAR TOOTHPASTE.  Please read over the following fact sheets that you were given. Pain Booklet, Coughing and Deep Breathing, Blood Transfusion Information, MRSA Information and Surgical Site Infection  Prevention

## 2017-08-01 ENCOUNTER — Other Ambulatory Visit: Payer: Self-pay | Admitting: *Deleted

## 2017-08-01 ENCOUNTER — Encounter (HOSPITAL_COMMUNITY): Payer: Self-pay

## 2017-08-01 ENCOUNTER — Encounter (HOSPITAL_COMMUNITY)
Admission: RE | Admit: 2017-08-01 | Discharge: 2017-08-01 | Disposition: A | Discharge status: Home / Self Care | Payer: Medicare HMO | Source: Ambulatory Visit | Attending: Cardiothoracic Surgery | Admitting: Cardiothoracic Surgery

## 2017-08-01 DIAGNOSIS — Z01812 Encounter for preprocedural laboratory examination: Secondary | ICD-10-CM | POA: Insufficient documentation

## 2017-08-01 DIAGNOSIS — E785 Hyperlipidemia, unspecified: Secondary | ICD-10-CM | POA: Diagnosis not present

## 2017-08-01 DIAGNOSIS — J9859 Other diseases of mediastinum, not elsewhere classified: Secondary | ICD-10-CM | POA: Insufficient documentation

## 2017-08-01 DIAGNOSIS — I341 Nonrheumatic mitral (valve) prolapse: Secondary | ICD-10-CM | POA: Insufficient documentation

## 2017-08-01 DIAGNOSIS — Z9889 Other specified postprocedural states: Secondary | ICD-10-CM | POA: Diagnosis not present

## 2017-08-01 DIAGNOSIS — R0602 Shortness of breath: Secondary | ICD-10-CM | POA: Insufficient documentation

## 2017-08-01 DIAGNOSIS — I10 Essential (primary) hypertension: Secondary | ICD-10-CM | POA: Diagnosis not present

## 2017-08-01 LAB — URINALYSIS, ROUTINE W REFLEX MICROSCOPIC
Bilirubin Urine: NEGATIVE
Glucose, UA: NEGATIVE mg/dL
Hgb urine dipstick: NEGATIVE
Ketones, ur: NEGATIVE mg/dL
Leukocytes, UA: NEGATIVE
Nitrite: NEGATIVE
Protein, ur: NEGATIVE mg/dL
Specific Gravity, Urine: 1.015 (ref 1.005–1.030)
pH: 7 (ref 5.0–8.0)

## 2017-08-01 LAB — COMPREHENSIVE METABOLIC PANEL
ALT: 14 U/L (ref 0–44)
AST: 21 U/L (ref 15–41)
Albumin: 4 g/dL (ref 3.5–5.0)
Alkaline Phosphatase: 83 U/L (ref 38–126)
Anion gap: 8 (ref 5–15)
BUN: 14 mg/dL (ref 8–23)
CO2: 24 mmol/L (ref 22–32)
Calcium: 9.7 mg/dL (ref 8.9–10.3)
Chloride: 109 mmol/L (ref 98–111)
Creatinine, Ser: 0.81 mg/dL (ref 0.44–1.00)
GFR calc Af Amer: >60 mL/min (ref >60)
GFR calc non Af Amer: >60 mL/min (ref >60)
Glucose, Bld: 105 mg/dL — ABNORMAL HIGH (ref 70–99)
Potassium: 4.1 mmol/L (ref 3.5–5.1)
Sodium: 141 mmol/L (ref 135–145)
Total Bilirubin: 0.8 mg/dL (ref 0.3–1.2)
Total Protein: 6.6 g/dL (ref 6.5–8.1)

## 2017-08-01 LAB — GLUCOSE, CAPILLARY: Glucose-Capillary: 106 mg/dL — ABNORMAL HIGH (ref 70–99)

## 2017-08-01 LAB — SURGICAL PCR SCREEN
MRSA, PCR: NEGATIVE
Staphylococcus aureus: NEGATIVE

## 2017-08-01 LAB — CBC
HCT: 43.1 % (ref 36.0–46.0)
Hemoglobin: 14.1 g/dL (ref 12.0–15.0)
MCH: 31.4 pg (ref 26.0–34.0)
MCHC: 32.7 g/dL (ref 30.0–36.0)
MCV: 96 fL (ref 78.0–100.0)
Platelets: 245 10*3/uL (ref 150–400)
RBC: 4.49 MIL/uL (ref 3.87–5.11)
RDW: 13.2 % (ref 11.5–15.5)
WBC: 5 10*3/uL (ref 4.0–10.5)

## 2017-08-01 LAB — PROTIME-INR
INR: 1
Prothrombin Time: 13.1 seconds (ref 11.4–15.2)

## 2017-08-01 LAB — APTT: aPTT: 30 seconds (ref 24–36)

## 2017-08-01 LAB — ABO/RH: ABO/RH(D): O POS

## 2017-08-03 LAB — BLOOD GAS, ARTERIAL
Bicarbonate: 26.3 mmol/L (ref 20.0–28.0)
Drawn by: 47059
FIO2: 21
O2 Saturation: 68.5 %
Patient temperature: 98.6
pCO2 arterial: 45.8 mmHg (ref 32.0–48.0)
pH, Arterial: 7.378 (ref 7.350–7.450)
pO2, Arterial: 45.8 mmHg — ABNORMAL LOW (ref 83.0–108.0)

## 2017-08-06 MED ORDER — CEFAZOLIN SODIUM-DEXTROSE 2-4 GM/100ML-% IV SOLN
2.0000 g | INTRAVENOUS | Status: AC
  Administered 2017-08-07: 2 g via INTRAVENOUS

## 2017-08-07 ENCOUNTER — Inpatient Hospital Stay (HOSPITAL_COMMUNITY)
Admission: RE | Admit: 2017-08-07 | Discharge: 2017-08-12 | DRG: 982 | Disposition: A | Discharge status: Home / Self Care | Payer: Medicare HMO | Attending: Cardiothoracic Surgery | Admitting: Cardiothoracic Surgery

## 2017-08-07 ENCOUNTER — Inpatient Hospital Stay (HOSPITAL_COMMUNITY): Payer: Medicare HMO | Admitting: Anesthesiology

## 2017-08-07 ENCOUNTER — Inpatient Hospital Stay (HOSPITAL_COMMUNITY): Payer: Medicare HMO

## 2017-08-07 ENCOUNTER — Encounter (HOSPITAL_COMMUNITY)
Admission: RE | Disposition: A | Discharge status: Home / Self Care | Payer: Self-pay | Source: Home / Self Care | Attending: Cardiothoracic Surgery

## 2017-08-07 ENCOUNTER — Encounter (HOSPITAL_COMMUNITY): Payer: Self-pay | Admitting: Urology

## 2017-08-07 DIAGNOSIS — I440 Atrioventricular block, first degree: Secondary | ICD-10-CM | POA: Diagnosis not present

## 2017-08-07 DIAGNOSIS — Z79899 Other long term (current) drug therapy: Secondary | ICD-10-CM

## 2017-08-07 DIAGNOSIS — I083 Combined rheumatic disorders of mitral, aortic and tricuspid valves: Secondary | ICD-10-CM | POA: Diagnosis present

## 2017-08-07 DIAGNOSIS — Z982 Presence of cerebrospinal fluid drainage device: Secondary | ICD-10-CM | POA: Diagnosis not present

## 2017-08-07 DIAGNOSIS — Z961 Presence of intraocular lens: Secondary | ICD-10-CM | POA: Diagnosis present

## 2017-08-07 DIAGNOSIS — R001 Bradycardia, unspecified: Secondary | ICD-10-CM | POA: Diagnosis not present

## 2017-08-07 DIAGNOSIS — Z8711 Personal history of peptic ulcer disease: Secondary | ICD-10-CM | POA: Diagnosis not present

## 2017-08-07 DIAGNOSIS — Z6838 Body mass index (BMI) 38.0-38.9, adult: Secondary | ICD-10-CM

## 2017-08-07 DIAGNOSIS — J9859 Other diseases of mediastinum, not elsewhere classified: Secondary | ICD-10-CM

## 2017-08-07 DIAGNOSIS — Z9841 Cataract extraction status, right eye: Secondary | ICD-10-CM | POA: Diagnosis not present

## 2017-08-07 DIAGNOSIS — R000 Tachycardia, unspecified: Secondary | ICD-10-CM | POA: Diagnosis not present

## 2017-08-07 DIAGNOSIS — D150 Benign neoplasm of thymus: Principal | ICD-10-CM | POA: Diagnosis present

## 2017-08-07 DIAGNOSIS — Z9889 Other specified postprocedural states: Secondary | ICD-10-CM

## 2017-08-07 DIAGNOSIS — D72829 Elevated white blood cell count, unspecified: Secondary | ICD-10-CM | POA: Diagnosis not present

## 2017-08-07 DIAGNOSIS — Z8049 Family history of malignant neoplasm of other genital organs: Secondary | ICD-10-CM | POA: Diagnosis not present

## 2017-08-07 DIAGNOSIS — I10 Essential (primary) hypertension: Secondary | ICD-10-CM | POA: Diagnosis present

## 2017-08-07 DIAGNOSIS — D47Z2 Castleman disease: Secondary | ICD-10-CM | POA: Diagnosis not present

## 2017-08-07 DIAGNOSIS — Z9842 Cataract extraction status, left eye: Secondary | ICD-10-CM

## 2017-08-07 DIAGNOSIS — Z888 Allergy status to other drugs, medicaments and biological substances status: Secondary | ICD-10-CM

## 2017-08-07 DIAGNOSIS — Z8249 Family history of ischemic heart disease and other diseases of the circulatory system: Secondary | ICD-10-CM

## 2017-08-07 DIAGNOSIS — J849 Interstitial pulmonary disease, unspecified: Secondary | ICD-10-CM | POA: Diagnosis not present

## 2017-08-07 DIAGNOSIS — K219 Gastro-esophageal reflux disease without esophagitis: Secondary | ICD-10-CM | POA: Diagnosis not present

## 2017-08-07 DIAGNOSIS — J9811 Atelectasis: Secondary | ICD-10-CM | POA: Diagnosis not present

## 2017-08-07 DIAGNOSIS — Z4682 Encounter for fitting and adjustment of non-vascular catheter: Secondary | ICD-10-CM | POA: Diagnosis not present

## 2017-08-07 DIAGNOSIS — Z8679 Personal history of other diseases of the circulatory system: Secondary | ICD-10-CM | POA: Diagnosis not present

## 2017-08-07 DIAGNOSIS — E785 Hyperlipidemia, unspecified: Secondary | ICD-10-CM | POA: Diagnosis present

## 2017-08-07 DIAGNOSIS — Z803 Family history of malignant neoplasm of breast: Secondary | ICD-10-CM

## 2017-08-07 DIAGNOSIS — E8770 Fluid overload, unspecified: Secondary | ICD-10-CM | POA: Diagnosis not present

## 2017-08-07 DIAGNOSIS — Z800 Family history of malignant neoplasm of digestive organs: Secondary | ICD-10-CM

## 2017-08-07 DIAGNOSIS — G8918 Other acute postprocedural pain: Secondary | ICD-10-CM | POA: Diagnosis not present

## 2017-08-07 DIAGNOSIS — D62 Acute posthemorrhagic anemia: Secondary | ICD-10-CM | POA: Diagnosis not present

## 2017-08-07 DIAGNOSIS — J90 Pleural effusion, not elsewhere classified: Secondary | ICD-10-CM

## 2017-08-07 DIAGNOSIS — D4989 Neoplasm of unspecified behavior of other specified sites: Secondary | ICD-10-CM | POA: Diagnosis not present

## 2017-08-07 HISTORY — PX: RESECTION OF MEDIASTINAL MASS: SHX6497

## 2017-08-07 HISTORY — PX: STERNOTOMY: SHX1057

## 2017-08-07 HISTORY — PX: LUNG BIOPSY: SHX5088

## 2017-08-07 LAB — GLUCOSE, CAPILLARY
Glucose-Capillary: 206 mg/dL — ABNORMAL HIGH (ref 70–99)
Glucose-Capillary: 195 mg/dL — ABNORMAL HIGH (ref 70–99)

## 2017-08-07 LAB — PREPARE RBC (CROSSMATCH)

## 2017-08-07 SURGERY — BIOPSY, LUNG
Anesthesia: General | Site: Chest | Laterality: Right

## 2017-08-07 MED ORDER — SUGAMMADEX SODIUM 500 MG/5ML IV SOLN
INTRAVENOUS | Status: DC | PRN
  Administered 2017-08-07: 250 mg via INTRAVENOUS

## 2017-08-07 MED ORDER — MIDAZOLAM HCL 2 MG/2ML IJ SOLN
INTRAMUSCULAR | Status: AC
  Filled 2017-08-07: qty 2

## 2017-08-07 MED ORDER — ONDANSETRON HCL 4 MG/2ML IJ SOLN
4.0000 mg | Freq: Four times a day (QID) | INTRAMUSCULAR | Status: DC | PRN
  Administered 2017-08-07 – 2017-08-09 (×3): 4 mg via INTRAVENOUS
  Filled 2017-08-07 (×3): qty 2

## 2017-08-07 MED ORDER — BUPROPION HCL ER (XL) 300 MG PO TB24
300.0000 mg | ORAL_TABLET | Freq: Every day | ORAL | Status: DC
  Administered 2017-08-08 – 2017-08-12 (×5): 300 mg via ORAL
  Filled 2017-08-07 (×5): qty 1

## 2017-08-07 MED ORDER — KETOROLAC TROMETHAMINE 15 MG/ML IJ SOLN
INTRAMUSCULAR | Status: AC
  Filled 2017-08-07: qty 1

## 2017-08-07 MED ORDER — PROMETHAZINE HCL 25 MG/ML IJ SOLN
6.2500 mg | Freq: Four times a day (QID) | INTRAMUSCULAR | Status: DC | PRN
  Administered 2017-08-08: 6.25 mg via INTRAVENOUS
  Filled 2017-08-07: qty 1

## 2017-08-07 MED ORDER — KETAMINE HCL 100 MG/ML IJ SOLN
INTRAMUSCULAR | Status: DC | PRN
  Administered 2017-08-07: 10 mg via INTRAVENOUS
  Administered 2017-08-07: 30 mg via INTRAVENOUS

## 2017-08-07 MED ORDER — POTASSIUM CHLORIDE 10 MEQ/50ML IV SOLN
10.0000 meq | Freq: Every day | INTRAVENOUS | Status: DC | PRN
  Administered 2017-08-08: 10 meq via INTRAVENOUS
  Filled 2017-08-07: qty 50

## 2017-08-07 MED ORDER — CEFAZOLIN SODIUM-DEXTROSE 2-4 GM/100ML-% IV SOLN
INTRAVENOUS | Status: AC
  Filled 2017-08-07: qty 100

## 2017-08-07 MED ORDER — LACTATED RINGERS IV SOLN
INTRAVENOUS | Status: DC
  Administered 2017-08-07 (×2): via INTRAVENOUS

## 2017-08-07 MED ORDER — LIDOCAINE HCL (CARDIAC) PF 100 MG/5ML IV SOSY
PREFILLED_SYRINGE | INTRAVENOUS | Status: DC | PRN
  Administered 2017-08-07: 60 mg via INTRAVENOUS

## 2017-08-07 MED ORDER — HYDROMORPHONE HCL 1 MG/ML IJ SOLN
INTRAMUSCULAR | Status: AC
  Filled 2017-08-07: qty 1

## 2017-08-07 MED ORDER — DEXTROSE-NACL 5-0.45 % IV SOLN
INTRAVENOUS | Status: DC
  Administered 2017-08-07 – 2017-08-08 (×4): via INTRAVENOUS

## 2017-08-07 MED ORDER — EPHEDRINE SULFATE 50 MG/ML IJ SOLN
INTRAMUSCULAR | Status: DC | PRN
  Administered 2017-08-07 (×2): 5 mg via INTRAVENOUS

## 2017-08-07 MED ORDER — PROMETHAZINE HCL 25 MG/ML IJ SOLN: 6.2500 mg | INTRAMUSCULAR | Status: DC | PRN

## 2017-08-07 MED ORDER — HEMOSTATIC AGENTS (NO CHARGE) OPTIME
TOPICAL | Status: DC | PRN
  Administered 2017-08-07 (×4): 1 via TOPICAL

## 2017-08-07 MED ORDER — POLYETHYLENE GLYCOL 400 0.25 % OP SOLN: 1.0000 [drp] | Freq: Every day | OPHTHALMIC | Status: DC | PRN

## 2017-08-07 MED ORDER — PHENYLEPHRINE HCL 10 MG/ML IJ SOLN
INTRAMUSCULAR | Status: DC | PRN
  Administered 2017-08-07 (×5): 80 ug via INTRAVENOUS

## 2017-08-07 MED ORDER — SODIUM CHLORIDE 0.9 % IJ SOLN
INTRAMUSCULAR | Status: AC
  Filled 2017-08-07: qty 10

## 2017-08-07 MED ORDER — DEXAMETHASONE SODIUM PHOSPHATE 10 MG/ML IJ SOLN
INTRAMUSCULAR | Status: AC
  Filled 2017-08-07: qty 1

## 2017-08-07 MED ORDER — BISACODYL 5 MG PO TBEC
10.0000 mg | DELAYED_RELEASE_TABLET | Freq: Every day | ORAL | Status: DC
  Administered 2017-08-08 – 2017-08-12 (×3): 10 mg via ORAL
  Filled 2017-08-07 (×4): qty 2

## 2017-08-07 MED ORDER — ACETAMINOPHEN 160 MG/5ML PO SOLN
1000.0000 mg | Freq: Four times a day (QID) | ORAL | Status: DC
  Administered 2017-08-08: 1000 mg via ORAL
  Filled 2017-08-07: qty 40.6

## 2017-08-07 MED ORDER — OXYCODONE HCL 5 MG PO TABS
ORAL_TABLET | ORAL | Status: AC
  Filled 2017-08-07: qty 2

## 2017-08-07 MED ORDER — HYDROMORPHONE HCL 1 MG/ML IJ SOLN
0.2500 mg | INTRAMUSCULAR | Status: DC | PRN
  Administered 2017-08-07: 1 mg via INTRAVENOUS

## 2017-08-07 MED ORDER — 0.9 % SODIUM CHLORIDE (POUR BTL) OPTIME
TOPICAL | Status: DC | PRN
  Administered 2017-08-07: 1000 mL

## 2017-08-07 MED ORDER — DEXAMETHASONE SODIUM PHOSPHATE 10 MG/ML IJ SOLN
INTRAMUSCULAR | Status: DC | PRN
  Administered 2017-08-07: 10 mg via INTRAVENOUS

## 2017-08-07 MED ORDER — INSULIN ASPART 100 UNIT/ML ~~LOC~~ SOLN
0.0000 [IU] | SUBCUTANEOUS | Status: DC
  Administered 2017-08-07: 8 [IU] via SUBCUTANEOUS
  Administered 2017-08-07: 4 [IU] via SUBCUTANEOUS
  Administered 2017-08-08: 2 [IU] via SUBCUTANEOUS
  Administered 2017-08-08: 4 [IU] via SUBCUTANEOUS

## 2017-08-07 MED ORDER — DEXMEDETOMIDINE HCL 200 MCG/2ML IV SOLN
INTRAVENOUS | Status: DC | PRN
  Administered 2017-08-07: 8 ug via INTRAVENOUS
  Administered 2017-08-07: 4 ug via INTRAVENOUS
  Administered 2017-08-07 (×2): 8 ug via INTRAVENOUS

## 2017-08-07 MED ORDER — LIDOCAINE 2% (20 MG/ML) 5 ML SYRINGE
INTRAMUSCULAR | Status: AC
  Filled 2017-08-07: qty 5

## 2017-08-07 MED ORDER — PHENYLEPHRINE 40 MCG/ML (10ML) SYRINGE FOR IV PUSH (FOR BLOOD PRESSURE SUPPORT)
PREFILLED_SYRINGE | INTRAVENOUS | Status: AC
  Filled 2017-08-07: qty 20

## 2017-08-07 MED ORDER — ROCURONIUM BROMIDE 10 MG/ML (PF) SYRINGE
PREFILLED_SYRINGE | INTRAVENOUS | Status: AC
  Filled 2017-08-07: qty 10

## 2017-08-07 MED ORDER — ARTIFICIAL TEARS OPHTHALMIC OINT
TOPICAL_OINTMENT | Freq: Every day | OPHTHALMIC | Status: DC | PRN
  Filled 2017-08-07: qty 3.5

## 2017-08-07 MED ORDER — PRAVASTATIN SODIUM 40 MG PO TABS
80.0000 mg | ORAL_TABLET | Freq: Every day | ORAL | Status: DC
  Administered 2017-08-08 – 2017-08-10 (×3): 80 mg via ORAL
  Filled 2017-08-07: qty 1
  Filled 2017-08-07 (×2): qty 2

## 2017-08-07 MED ORDER — OXYCODONE HCL 5 MG PO TABS
5.0000 mg | ORAL_TABLET | ORAL | Status: DC | PRN
  Administered 2017-08-07 (×2): 10 mg via ORAL
  Administered 2017-08-08 – 2017-08-09 (×5): 5 mg via ORAL
  Administered 2017-08-09: 10 mg via ORAL
  Administered 2017-08-10 (×3): 5 mg via ORAL
  Filled 2017-08-07 (×2): qty 1
  Filled 2017-08-07: qty 2
  Filled 2017-08-07 (×4): qty 1
  Filled 2017-08-07 (×2): qty 2
  Filled 2017-08-07: qty 1

## 2017-08-07 MED ORDER — PROPOFOL 10 MG/ML IV BOLUS
INTRAVENOUS | Status: DC | PRN
  Administered 2017-08-07: 30 mg via INTRAVENOUS
  Administered 2017-08-07: 150 mg via INTRAVENOUS
  Administered 2017-08-07: 20 mg via INTRAVENOUS

## 2017-08-07 MED ORDER — FENTANYL CITRATE (PF) 100 MCG/2ML IJ SOLN
INTRAMUSCULAR | Status: AC
  Filled 2017-08-07: qty 2

## 2017-08-07 MED ORDER — FENTANYL CITRATE (PF) 250 MCG/5ML IJ SOLN
INTRAMUSCULAR | Status: AC
  Filled 2017-08-07: qty 10

## 2017-08-07 MED ORDER — KETOROLAC TROMETHAMINE 30 MG/ML IJ SOLN
INTRAMUSCULAR | Status: DC | PRN
Start: 2017-08-07 — End: 2017-08-07
  Administered 2017-08-07: 15 mg via INTRAVENOUS

## 2017-08-07 MED ORDER — SENNOSIDES-DOCUSATE SODIUM 8.6-50 MG PO TABS
1.0000 | ORAL_TABLET | Freq: Every day | ORAL | Status: DC
  Administered 2017-08-07 – 2017-08-11 (×5): 1 via ORAL
  Filled 2017-08-07 (×5): qty 1

## 2017-08-07 MED ORDER — SUGAMMADEX SODIUM 500 MG/5ML IV SOLN
INTRAVENOUS | Status: AC
  Filled 2017-08-07: qty 5

## 2017-08-07 MED ORDER — ONDANSETRON HCL 4 MG/2ML IJ SOLN
INTRAMUSCULAR | Status: DC | PRN
  Administered 2017-08-07: 4 mg via INTRAVENOUS

## 2017-08-07 MED ORDER — METOCLOPRAMIDE HCL 5 MG/ML IJ SOLN
10.0000 mg | Freq: Four times a day (QID) | INTRAMUSCULAR | Status: DC | PRN
  Administered 2017-08-07: 10 mg via INTRAVENOUS
  Filled 2017-08-07 (×2): qty 2

## 2017-08-07 MED ORDER — TRAMADOL HCL 50 MG PO TABS
50.0000 mg | ORAL_TABLET | Freq: Four times a day (QID) | ORAL | Status: DC | PRN
  Administered 2017-08-08 – 2017-08-09 (×4): 100 mg via ORAL
  Administered 2017-08-11: 50 mg via ORAL
  Administered 2017-08-11: 100 mg via ORAL
  Administered 2017-08-12: 50 mg via ORAL
  Filled 2017-08-07 (×3): qty 2
  Filled 2017-08-07: qty 1
  Filled 2017-08-07 (×2): qty 2
  Filled 2017-08-07: qty 1

## 2017-08-07 MED ORDER — FENTANYL CITRATE (PF) 100 MCG/2ML IJ SOLN
INTRAMUSCULAR | Status: DC | PRN
  Administered 2017-08-07: 100 ug via INTRAVENOUS
  Administered 2017-08-07: 25 ug via INTRAVENOUS
  Administered 2017-08-07: 50 ug via INTRAVENOUS
  Administered 2017-08-07: 150 ug via INTRAVENOUS
  Administered 2017-08-07: 100 ug via INTRAVENOUS
  Administered 2017-08-07: 25 ug via INTRAVENOUS

## 2017-08-07 MED ORDER — ONDANSETRON HCL 4 MG/2ML IJ SOLN
INTRAMUSCULAR | Status: AC
  Filled 2017-08-07: qty 2

## 2017-08-07 MED ORDER — ROCURONIUM BROMIDE 100 MG/10ML IV SOLN
INTRAVENOUS | Status: DC | PRN
  Administered 2017-08-07: 20 mg via INTRAVENOUS
  Administered 2017-08-07: 50 mg via INTRAVENOUS

## 2017-08-07 MED ORDER — KETAMINE HCL 100 MG/ML IJ SOLN
INTRAMUSCULAR | Status: AC
  Filled 2017-08-07: qty 1

## 2017-08-07 MED ORDER — ACETAMINOPHEN 500 MG PO TABS
1000.0000 mg | ORAL_TABLET | Freq: Four times a day (QID) | ORAL | Status: DC
  Administered 2017-08-07 – 2017-08-12 (×15): 1000 mg via ORAL
  Filled 2017-08-07 (×16): qty 2

## 2017-08-07 MED ORDER — KETOROLAC TROMETHAMINE 15 MG/ML IJ SOLN
15.0000 mg | Freq: Four times a day (QID) | INTRAMUSCULAR | Status: AC
  Administered 2017-08-07 – 2017-08-08 (×4): 15 mg via INTRAVENOUS
  Filled 2017-08-07 (×3): qty 1

## 2017-08-07 MED ORDER — MIDAZOLAM HCL 5 MG/5ML IJ SOLN
INTRAMUSCULAR | Status: DC | PRN
  Administered 2017-08-07: 2 mg via INTRAVENOUS

## 2017-08-07 MED ORDER — NALOXONE HCL 0.4 MG/ML IJ SOLN
INTRAMUSCULAR | Status: AC
  Filled 2017-08-07: qty 1

## 2017-08-07 MED ORDER — KETOROLAC TROMETHAMINE 30 MG/ML IJ SOLN
INTRAMUSCULAR | Status: AC
  Filled 2017-08-07: qty 1

## 2017-08-07 MED ORDER — CEFAZOLIN SODIUM-DEXTROSE 2-4 GM/100ML-% IV SOLN
2.0000 g | Freq: Three times a day (TID) | INTRAVENOUS | Status: AC
  Administered 2017-08-07 – 2017-08-08 (×2): 2 g via INTRAVENOUS
  Filled 2017-08-07 (×2): qty 100

## 2017-08-07 MED ORDER — LACTATED RINGERS IV SOLN
INTRAVENOUS | Status: DC | PRN
  Administered 2017-08-07: 11:00:00 via INTRAVENOUS

## 2017-08-07 MED ORDER — FENTANYL CITRATE (PF) 100 MCG/2ML IJ SOLN
25.0000 ug | INTRAMUSCULAR | Status: DC | PRN
  Administered 2017-08-07: 50 ug via INTRAVENOUS
  Administered 2017-08-08: 25 ug via INTRAVENOUS
  Filled 2017-08-07 (×2): qty 2

## 2017-08-07 MED ORDER — VANCOMYCIN HCL IN DEXTROSE 1-5 GM/200ML-% IV SOLN
1000.0000 mg | Freq: Two times a day (BID) | INTRAVENOUS | Status: AC
  Administered 2017-08-07: 1000 mg via INTRAVENOUS
  Filled 2017-08-07: qty 200

## 2017-08-07 SURGICAL SUPPLY — 106 items
ADH SKN CLS APL DERMABOND .7 (GAUZE/BANDAGES/DRESSINGS)
APL SKNCLS STERI-STRIP NONHPOA (GAUZE/BANDAGES/DRESSINGS)
BAG DECANTER FOR FLEXI CONT (MISCELLANEOUS) ×2 IMPLANT
BENZOIN TINCTURE PRP APPL 2/3 (GAUZE/BANDAGES/DRESSINGS) IMPLANT
BINDER BREAST XLRG (GAUZE/BANDAGES/DRESSINGS) ×2 IMPLANT
BLADE CORE FAN STRYKER (BLADE) ×4 IMPLANT
BLADE SURG 11 STRL SS (BLADE) ×2 IMPLANT
CANISTER SUCT 3000ML PPV (MISCELLANEOUS) ×6 IMPLANT
CATH KIT ON Q 5IN SLV (PAIN MANAGEMENT) IMPLANT
CATH ROBINSON RED A/P 22FR (CATHETERS) IMPLANT
CATH THORACIC 28FR (CATHETERS) ×2 IMPLANT
CATH THORACIC 28FR RT ANG (CATHETERS) IMPLANT
CATH THORACIC 36FR (CATHETERS) IMPLANT
CATH THORACIC 36FR RT ANG (CATHETERS) IMPLANT
CHERRY SPONGEY 1/2 (GAUZE/BANDAGES/DRESSINGS) ×2 IMPLANT
CLIP VESOCCLUDE SM WIDE 24/CT (CLIP) ×2 IMPLANT
CONN ST 1/4X3/8  BEN (MISCELLANEOUS) ×2
CONN ST 1/4X3/8 BEN (MISCELLANEOUS) IMPLANT
CONN Y 3/8X3/8X3/8  BEN (MISCELLANEOUS) ×2
CONN Y 3/8X3/8X3/8 BEN (MISCELLANEOUS) ×2 IMPLANT
CONT SPEC 4OZ CLIKSEAL STRL BL (MISCELLANEOUS) ×8 IMPLANT
COVER SURGICAL LIGHT HANDLE (MISCELLANEOUS) ×6 IMPLANT
DERMABOND ADVANCED (GAUZE/BANDAGES/DRESSINGS)
DERMABOND ADVANCED .7 DNX12 (GAUZE/BANDAGES/DRESSINGS) IMPLANT
DRAIN CHANNEL 32F RND 10.7 FF (WOUND CARE) ×2 IMPLANT
DRAPE LAPAROSCOPIC ABDOMINAL (DRAPES) ×4 IMPLANT
DRAPE SLUSH/WARMER DISC (DRAPES) ×2 IMPLANT
DRAPE WARM FLUID 44X44 (DRAPE) ×4 IMPLANT
DRSG AQUACEL AG ADV 3.5X14 (GAUZE/BANDAGES/DRESSINGS) ×4 IMPLANT
DRSG PAD ABDOMINAL 8X10 ST (GAUZE/BANDAGES/DRESSINGS) IMPLANT
ELECT REM PT RETURN 9FT ADLT (ELECTROSURGICAL) ×4
ELECTRODE REM PT RTRN 9FT ADLT (ELECTROSURGICAL) ×2 IMPLANT
FLOSEAL 5ML (HEMOSTASIS) ×4 IMPLANT
GAUZE SPONGE 4X4 12PLY STRL (GAUZE/BANDAGES/DRESSINGS) ×4 IMPLANT
GAUZE XEROFORM 5X9 LF (GAUZE/BANDAGES/DRESSINGS) IMPLANT
GEL ULTRASOUND 20GR AQUASONIC (MISCELLANEOUS) IMPLANT
GLOVE BIO SURGEON STRL SZ7.5 (GLOVE) ×8 IMPLANT
GLOVE SURG SIGNA 7.5 PF LTX (GLOVE) ×4 IMPLANT
GOWN STRL REUS W/ TWL LRG LVL3 (GOWN DISPOSABLE) ×8 IMPLANT
GOWN STRL REUS W/TWL LRG LVL3 (GOWN DISPOSABLE) ×12
HEMOSTAT POWDER SURGIFOAM 1G (HEMOSTASIS) IMPLANT
HEMOSTAT SURGICEL 2X14 (HEMOSTASIS) IMPLANT
KIT BASIN OR (CUSTOM PROCEDURE TRAY) ×4 IMPLANT
KIT SUCTION CATH 14FR (SUCTIONS) ×4 IMPLANT
KIT TURNOVER KIT B (KITS) ×4 IMPLANT
NS IRRIG 1000ML POUR BTL (IV SOLUTION) ×12 IMPLANT
PACK CHEST (CUSTOM PROCEDURE TRAY) ×4 IMPLANT
PAD ARMBOARD 7.5X6 YLW CONV (MISCELLANEOUS) ×8 IMPLANT
PAD ELECT DEFIB RADIOL ZOLL (MISCELLANEOUS) ×4 IMPLANT
PIN SAFETY STERILE (MISCELLANEOUS) IMPLANT
POWDER SURGICEL 3.0 GRAM (HEMOSTASIS) ×2 IMPLANT
RELOAD STAPLE 60 3.8 GOLD REG (STAPLE) IMPLANT
RELOAD STAPLER GOLD 60MM (STAPLE) ×6 IMPLANT
RUBBERBAND STERILE (MISCELLANEOUS) IMPLANT
SEALANT SURG COSEAL 4ML (VASCULAR PRODUCTS) ×2 IMPLANT
SOLUTION ANTI FOG 6CC (MISCELLANEOUS) ×2 IMPLANT
SPECIMEN JAR MEDIUM (MISCELLANEOUS) ×2 IMPLANT
SPONGE LAP 18X18 X RAY DECT (DISPOSABLE) ×4 IMPLANT
SPONGE TONSIL 1.25 RF SGL STRG (GAUZE/BANDAGES/DRESSINGS) ×2 IMPLANT
STAPLE ECHEON FLEX 60 POW ENDO (STAPLE) ×2 IMPLANT
STAPLER RELOAD GOLD 60MM (STAPLE) ×12
STAPLER VISISTAT 35W (STAPLE) IMPLANT
STRAP MONTGOMERY 1.25X11-1/8 (MISCELLANEOUS) IMPLANT
SUCTION POOLE TIP (SUCTIONS) IMPLANT
SUT CHROMIC 3 0 SH 27 (SUTURE) IMPLANT
SUT ETHILON 3 0 FSL (SUTURE) IMPLANT
SUT ETHILON 3 0 PS 1 (SUTURE) IMPLANT
SUT PROLENE 3 0 SH DA (SUTURE) IMPLANT
SUT PROLENE 4 0 RB 1 (SUTURE) ×8
SUT PROLENE 4-0 RB1 .5 CRCL 36 (SUTURE) IMPLANT
SUT PROLENE 6 0 C 1 30 (SUTURE) ×8 IMPLANT
SUT SILK  1 MH (SUTURE) ×4
SUT SILK 1 MH (SUTURE) ×4 IMPLANT
SUT SILK 1 TIES 10X30 (SUTURE) IMPLANT
SUT SILK 2 0SH CR/8 30 (SUTURE) ×2 IMPLANT
SUT SILK 3 0 SH CR/8 (SUTURE) ×2 IMPLANT
SUT SILK 3 0SH CR/8 30 (SUTURE) IMPLANT
SUT STEEL 6MS V (SUTURE) ×2 IMPLANT
SUT STEEL STERNAL CCS#1 18IN (SUTURE) IMPLANT
SUT STEEL SZ 6 DBL 3X14 BALL (SUTURE) ×2 IMPLANT
SUT VIC AB 1 CTX 18 (SUTURE) ×4 IMPLANT
SUT VIC AB 1 CTX 36 (SUTURE)
SUT VIC AB 1 CTX36XBRD ANBCTR (SUTURE) IMPLANT
SUT VIC AB 2 TP1 27 (SUTURE) IMPLANT
SUT VIC AB 2-0 CT1 27 (SUTURE)
SUT VIC AB 2-0 CT1 TAPERPNT 27 (SUTURE) IMPLANT
SUT VIC AB 2-0 CTX 27 (SUTURE) ×4 IMPLANT
SUT VIC AB 2-0 CTX 36 (SUTURE) IMPLANT
SUT VIC AB 3-0 MH 27 (SUTURE) IMPLANT
SUT VIC AB 3-0 SH 18 (SUTURE) IMPLANT
SUT VIC AB 3-0 SH 27 (SUTURE)
SUT VIC AB 3-0 SH 27X BRD (SUTURE) IMPLANT
SUT VIC AB 3-0 X1 27 (SUTURE) ×4 IMPLANT
SUT VICRYL 0 UR6 27IN ABS (SUTURE) ×4 IMPLANT
SUT VICRYL 2 TP 1 (SUTURE) IMPLANT
SUT VICRYL 4-0 PS2 18IN ABS (SUTURE) IMPLANT
SWAB COLLECTION DEVICE MRSA (MISCELLANEOUS) IMPLANT
SWAB CULTURE ESWAB REG 1ML (MISCELLANEOUS) IMPLANT
SYSTEM SAHARA CHEST DRAIN ATS (WOUND CARE) ×4 IMPLANT
TAPE CLOTH SURG 4X10 WHT LF (GAUZE/BANDAGES/DRESSINGS) ×2 IMPLANT
TOWEL GREEN STERILE (TOWEL DISPOSABLE) ×4 IMPLANT
TOWEL GREEN STERILE FF (TOWEL DISPOSABLE) ×4 IMPLANT
TRAP SPECIMEN MUCOUS 40CC (MISCELLANEOUS) IMPLANT
TRAY FOLEY MTR SLVR 16FR STAT (SET/KITS/TRAYS/PACK) ×4 IMPLANT
TRAY FOLEY SLVR 16FR TEMP STAT (SET/KITS/TRAYS/PACK) ×2 IMPLANT
WATER STERILE IRR 1000ML POUR (IV SOLUTION) ×6 IMPLANT

## 2017-08-07 NOTE — Op Note (Signed)
NAME: Alison Ayala, Alison Ayala MEDICAL RECORD [id number] ACCOUNT [id number] DATE OF BIRTH:1945-03-01 FACILITY: [facility] LOCATION: [facility] PHYSICIAN:Alvah Gilder VAN TRIGT III, MD  OPERATIVE REPORT  DATE OF PROCEDURE:  08/07/2017  PROCEDURE PERFORMED: 1.  Median sternotomy for resection of large anterior mediastinal tumor, thymoma 6 cm. 2.  Open lung biopsy of right upper lobe and right middle lobe for concern over interstitial lung disease.  SURGEON:  Ivin Poot, III, MD  ASSISTANT:  Shaaron Adler PA-C.  ANESTHESIA:  General.  PREOPERATIVE DIAGNOSES:  1.  Anterior mediastinal tumor with mild activity on PET scan, SUV 4.0.  Noted as an incidental finding on CT scan to assess for pulmonary fibrosis. 2.  Clinical diagnosis of possible pulmonary fibrosis by her pulmonologist.  POSTOPERATIVE DIAGNOSES:   1.  Anterior mediastinal tumor with mild activity on PET scan, SUV 4.0.  Noted as an incidental finding on CT scan to assess for pulmonary fibrosis. 2.  Clinical diagnosis of possible pulmonary fibrosis by her pulmonologist.  DESCRIPTION OF PROCEDURE:  After the patient had been evaluated fully in the office and after she was reexamined in the preoperative holding area where informed consent was documented and the proper site was marked and all questions were addressed, the  patient was brought back to the operating room for the operation as noted above.  She had been counseled on the benefits of the surgery as well as the potential risks including the risks of bleeding, blood transfusion, infection, postoperative pulmonary  problems including pneumothorax or pleural effusion, postoperative infection, postoperative organ failure, and death.  She had agreed to proceed with this operation.  DESCRIPTION OF PROCEDURE:  The patient was placed supine on the operating table.  General anesthesia was induced.  The chest, abdomen and upper thighs were prepped and draped as a sterile field.  A proper time-out  was performed.  A sternal incision was  made.  The sternum was gently retracted.  The anterior mediastinal fat was dissected off the anterior surface of the pericardium.  The dissection was carried down to the right side of the heart and mediastinum and the right pleural membrane was dissected  off the pericardium.  Here a 5-6 cm firm mediastinal mass was noted.  It was carefully dissected off the innominate vein, the pericardium close to the phrenic nerve, and was resected en bloc with a piece of the right pleura involved.  Hemostasis was  achieved.  Next, attention was directed to the open lung biopsy.  The right pleura was opened.  The stapling device was used to do open wedge biopsies of the right upper lobe and right middle lobe.  These were sent for permanent sectioning and referral for  pulmonary fibrosis consultation.  The staple lines were intact.  Chest tubes were placed in the mediastinum, right pleural space and brought out through separate incisions.  The superior mediastinal fat was closed over the pericardium.  The sternum was  closed with a wire.  The pectoralis fascia was closed with a running Vicryl.  Subcutaneous and skin layers were closed with running Vicryl and sterile dressings were applied.    The patient was extubated, returned to recovery room in stable condition.  AN/NUANCE  D:08/07/2017 T:08/07/2017 JOB:[id number]

## 2017-08-07 NOTE — Progress Notes (Signed)
Pre Procedure note for inpatients:   Alison Ayala has been scheduled for Procedure(s): Open LUNG BIOPSY (Right) STERNOTOMY (N/A) RESECTION OF MEDIASTINAL MASS (N/A) today. The various methods of treatment have been discussed with the patient. After consideration of the risks, benefits and treatment options the patient has consented to the planned procedure.   The patient has been seen and labs reviewed. There are no changes in the patient's condition to prevent proceeding with the planned procedure today.  Recent labs:  Lab Results  Component Value Date   WBC 5.0 08/01/2017   HGB 14.1 08/01/2017   HCT 43.1 08/01/2017   PLT 245 08/01/2017   GLUCOSE 105 (H) 08/01/2017   ALT 14 08/01/2017   AST 21 08/01/2017   NA 141 08/01/2017   K 4.1 08/01/2017   CL 109 08/01/2017   CREATININE 0.81 08/01/2017   BUN 14 08/01/2017   CO2 24 08/01/2017   INR 1.00 08/01/2017    Len Childs, MD 08/07/2017 9:52 AM

## 2017-08-07 NOTE — Anesthesia Preprocedure Evaluation (Signed)
Anesthesia Evaluation  Patient identified by MRN, date of birth, ID band Patient awake    Reviewed: Allergy & Precautions, NPO status , Patient's Chart, lab work & pertinent test results  Airway Mallampati: II  TM Distance: >3 FB Neck ROM: Full    Dental no notable dental hx.    Pulmonary  Basilar predominant patchy and subpleural ground-glass with interlobular/intra lobular septal thickening and air trapping. By ATS Fleischner criteria, findings are not indicative of UIP but suggest an alternate diagnosis such as mild chronic hypersensitivity pneumonitis or nonspecific interstitial pneumonitis. 2. Soft tissue mass with coarse calcification in the prevascular space with adjacent mediastinal nodularity. Diagnostic considerations include a germ-cell tumor or thymic neoplasm, as on 06/19/2017.   Pulmonary exam normal breath sounds clear to auscultation       Cardiovascular hypertension, Normal cardiovascular exam Rhythm:Regular Rate:Normal  Left ventricle: The cavity size was normal. There was mild focal   basal hypertrophy of the septum. Systolic function was normal.   The estimated ejection fraction was in the range of 55% to 60%.   Wall motion was normal; there were no regional wall motion   abnormalities. - Aortic valve: Trileaflet; mildly thickened, mildly calcified   leaflets. There was mild regurgitation. - Mitral valve: Mild prolapse, involving the anterior leaflet.   There was trivial regurgitation. - Left atrium: The atrium was mildly dilated. - Tricuspid valve: There was trivial regurgitation.   Neuro/Psych negative neurological ROS  negative psych ROS   GI/Hepatic negative GI ROS, Neg liver ROS,   Endo/Other  Morbid obesity  Renal/GU negative Renal ROS  negative genitourinary   Musculoskeletal negative musculoskeletal ROS (+)   Abdominal   Peds negative pediatric ROS (+)  Hematology negative  hematology ROS (+)   Anesthesia Other Findings   Reproductive/Obstetrics negative OB ROS                             Anesthesia Physical Anesthesia Plan  ASA: III  Anesthesia Plan: General   Post-op Pain Management:    Induction: Intravenous  PONV Risk Score and Plan: 3 and Ondansetron, Treatment may vary due to age or medical condition and Dexamethasone  Airway Management Planned: Oral ETT  Additional Equipment: Arterial line, CVP and Ultrasound Guidance Line Placement  Intra-op Plan:   Post-operative Plan: Possible Post-op intubation/ventilation  Informed Consent: I have reviewed the patients History and Physical, chart, labs and discussed the procedure including the risks, benefits and alternatives for the proposed anesthesia with the patient or authorized representative who has indicated his/her understanding and acceptance.   Dental advisory given  Plan Discussed with: CRNA and Surgeon  Anesthesia Plan Comments:         Anesthesia Quick Evaluation

## 2017-08-07 NOTE — Anesthesia Procedure Notes (Addendum)
Procedure Name: Intubation Performed by: Lance Coon, CRNA Pre-anesthesia Checklist: Patient identified, Emergency Drugs available, Suction available, Patient being monitored and Timeout performed Patient Re-evaluated:Patient Re-evaluated prior to induction Oxygen Delivery Method: Circle system utilized Preoxygenation: Pre-oxygenation with 100% oxygen Induction Type: IV induction Ventilation: Mask ventilation without difficulty Laryngoscope Size: Glidescope and 4 Grade View: Grade I Tube type: Oral Tube size: 7.0 mm Number of attempts: 1 Airway Equipment and Method: Stylet and Video-laryngoscopy Placement Confirmation: ETT inserted through vocal cords under direct vision,  breath sounds checked- equal and bilateral and positive ETCO2 Secured at: 21 cm Tube secured with: Tape Dental Injury: Teeth and Oropharynx as per pre-operative assessment  Difficulty Due To: Difficult Airway- due to anterior larynx and Difficulty was anticipated Future Recommendations: Recommend- induction with short-acting agent, and alternative techniques readily available

## 2017-08-07 NOTE — Anesthesia Procedure Notes (Signed)
Central Venous Catheter Insertion Performed by: Myrtie Soman, MD, anesthesiologist Start/End7/03/2017 9:45 AM, 08/07/2017 10:01 AM Patient location: Pre-op. Preanesthetic checklist: patient identified, IV checked, site marked, risks and benefits discussed, surgical consent, monitors and equipment checked, pre-op evaluation, timeout performed and anesthesia consent Position: Trendelenburg Lidocaine 1% used for infiltration and patient sedated Hand hygiene performed , maximum sterile barriers used  and Seldinger technique used Catheter size: 8 Fr Total catheter length 16. Central line was placed.Double lumen Procedure performed using ultrasound guided technique. Ultrasound Notes:anatomy identified, needle tip was noted to be adjacent to the nerve/plexus identified, no ultrasound evidence of intravascular and/or intraneural injection and image(s) printed for medical record Attempts: 1 Following insertion, dressing applied, line sutured and Biopatch. Post procedure assessment: blood return through all ports, free fluid flow and no air  Patient tolerated the procedure well with no immediate complications.

## 2017-08-07 NOTE — Anesthesia Procedure Notes (Signed)
Anesthesia Procedure Image    

## 2017-08-07 NOTE — H&P (Signed)
PCP is Harlan Stains, MD Referring Provider is No ref. provider found  No chief complaint on file.   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  This was an incidental finding on CT scan to assess for interstitial lung disease-pulmonary fibrosis.  PET scan shows the mediastinal mass to have a activity of 4.5 units consistent with a slow-growing thymic tumor.  No evidence of metastatic disease.  The adrenal tumor noted on the original CT scan shows mild activity consistent with a adrenal adenoma.  The patient is recommended to have the thymic tumor removed which would require sternotomy.  The patient's pulmonologist Dr. Chase Caller requests a lung biopsy to document the clinical diagnosis of possible interstitial lung disease, which is appropriate.  I discussed both procedures with the patient and her husband.  We will perform the sternotomy, resect the mediastinal tumor then open the right pleura for open biopsies of the right lung.  Dr. Chase Caller is recommended video bronchoscopy with endobronchial biopsies to assess for interstitial lung disease after the patient is asleep but prior to open chest surgery.  The patient's PFTs show adequate FEV1 and FVC and diffusion capacity to tolerate sternotomy. Patient has hypertension, hyperlipidemia and positive history of coronary disease-myocardial infarction in her father.  She will need cardiology clearance prior to sternotomy and resection of mediastinal mass and lung biopsy. The patient is Interested in getting the surgery done as soon as possible.  We will tentatively schedule the date of surgery on June 28 assuming that her cardiology assessment can be completed before then.  Past medical history  Patient has history of Mitral valve prolapse from a remote echo which is not in the system  Patient has history of a ventricular- peritoneal shunt following subarachnoid  hemorrhage and a lengthy hospitalization 15 years ago.  The shunt is on the right side of the neck and right anterior chest wall and into the right upper abdominal cavity  The patient has HTN and family hx of CAD. She was evaluated by Dr Acie Fredrickson who obyained echocardiogram showing normal EF, mild AI ,trvial TR and MR. He felt she was low risk for surgery and documented cardiac clearance She presents today fro sternotomy, resection of mediastinal mass and Right lung biopsy for possible interstitial lung disease.  Informed consent is documented today and all questions addressed. The patient has deferred transbronchial biopsy.  Past Medical History:  Diagnosis Date  . Carpal tunnel syndrome of right wrist   . Diverticulosis   . GERD (gastroesophageal reflux disease)   . H/O hiatal hernia   . H/O intraventricular hemorrhage 2004--  SPONTANEOUS   S/P SHUNT  . Heart murmur   . Hip pain, right OCCASIONAL -- PINCHED NERVE  . History of gastric ulcer 2001  . Hyperlipidemia   . Hypertension   . Hypoglycemia   . MVP (mitral valve prolapse)   . Normal echocardiogram 30 YRS AGO  . S/P ventriculoperitoneal shunt 2004--- PER PT SHUNT IS WORKING WELL --  NO ISSUES  . Squamous cell cancer of scalp and skin of neck    mole back of head    Past Surgical History:  Procedure Laterality Date  . BILATERAL FRONTAL INTERVENTRICULAR CATHETER VIA TWIST DRILL HOLE  AUG 2004   SPONTANEOUS SUBARACHNOID  HEMORRHAGE AND  INTRAVENTRICULAR HEMORRHAGE  . CARPAL TUNNEL RELEASE  07/26/2011   Procedure: CARPAL TUNNEL RELEASE;  Surgeon: Magnus Sinning, MD;  Location: [facility]  [place];  Service: Orthopedics;  Laterality: Right;  . CATARACT EXTRACTION W/ INTRAOCULAR LENS  IMPLANT, BILATERAL  2012   BILATERAL  . DUPUYTREN CONTRACTURE RELEASE  07/26/2011   Procedure: DUPUYTREN CONTRACTURE RELEASE;  Surgeon: Magnus Sinning, MD;  Location: [place];  Service: Orthopedics;  Laterality: Right;   EXCISION OF DUPYTRENS BAND THUMB- INDEX WEB SPACE  . RETINAL DETACHMENT REPAIR W/ SCLERAL BUCKLE LE  03-10-2011   LEFT EYE  . VENTRICULOPERITONEAL SHUNT  10-08-2002   RIGHT FRONTAL FOR COMMUNICATING HYDROCEPHALUS    Family History  Problem Relation Age of Onset  . Colon cancer Mother 68  . Stomach cancer Maternal Aunt   . Breast cancer Maternal Aunt   . Uterine cancer Maternal Aunt     Social History Social History   Tobacco Use  . Smoking status: Never Smoker  . Smokeless tobacco: Never Used  Substance Use Topics  . Alcohol use: Yes    Alcohol/week: 1.2 oz    Types: 2 Glasses of wine per week    Comment: OCCASIONAL  . Drug use: No    Current Facility-Administered Medications  Medication Dose Route Frequency Provider Last Rate Last Dose  . ceFAZolin (ANCEF) 2-4 GM/100ML-% IVPB           . ceFAZolin (ANCEF) IVPB 2g/100 mL premix  2 g Intravenous To SS-Surg Prescott Gum, Collier Salina, MD      . fentaNYL (SUBLIMAZE) 100 MCG/2ML injection           . lactated ringers infusion   Intravenous Continuous Myrtie Soman, MD 10 mL/hr at 08/07/17 0911    . midazolam (VERSED) 2 MG/2ML injection             Allergies  Allergen Reactions  . Carvedilol Palpitations and Other (See Comments)    Abdominal bloating  . Atorvastatin     fatigue  . Claritin [Loratadine] Other (See Comments)    Blurred vision and dry eyes  . Lisinopril Other (See Comments)    "drunk feeling"    Review of Systems                    Review of Systems :  [ y ] = yes, [  ] = no        General :  Weight gain [   ]    Weight loss  [   ]  Fatigue [  ]  Fever [  ]  Chills  [  ]                                          HEENT    Headache [  ]  Dizziness [  ]  Blurred vision [  ] Glaucoma  [  ]                          Nosebleeds [  ] Painful or loose teeth [  ]        Cardiac :  Chest pain/ pressure [  ]  Resting SOB [  ] exertional SOB [  ]                        Orthopnea [  ]  Pedal edema  [  ]  Palpitations [   ] Syncope/presyncope [ ]   Paroxysmal nocturnal dyspnea [  ]         Pulmonary : cough [  ]  wheezing [  ]  Hemoptysis [  ] Sputum [  ] Snoring [  ]                              Pneumothorax [  ]  Sleep apnea [  ]        GI : Vomiting [  ]  Dysphagia [  ]  Melena  [  ]  Abdominal pain [  ] BRBPR [  ]              Heart burn [  ]  Constipation [  ] Diarrhea  [  ] Colonoscopy [   ]        GU : Hematuria [  ]  Dysuria [  ]  Nocturia [  ] UTI's [  ]        Vascular : Claudication [  ]  Rest pain [  ]  DVT [  ] Vein stripping [  ] leg ulcers [  ]                          TIA [  ] Stroke [  ]  Varicose veins [  ]        NEURO :  Headaches  [  ] Seizures [  ] Vision changes [  ] Paresthesias [  ]                            RHD, hx of ventricula- peritoneal shunt for hydrocephalus                   Musculoskeletal :  Arthritis [  ] Gout  [  ]  Back pain [  ]  Joint pain [  ]        Skin :  Rash [  ]  Melanoma [  ] Sores [  ]        Heme : Bleeding problems [  ]Clotting Disorders [  ] Anemia [  ]Blood Transfusion [ ]         Endocrine : Diabetes [  ] Heat or Cold intolerance [  ] Polyuria [  ]excessive thirst [ ]         Psych : Depression [  ]  Anxiety [  ]  Psych hospitalizations [  ] Memory change [  ]                                                                             BP (!) 158/71   Pulse 75   Temp 98.2 F (36.8 C) (Oral)   Resp 18   Ht 5\' 4"  (1.626 m)   Wt 224 lb 14.4 oz (102 kg)   SpO2 98%   BMI 38.60 kg/m  Physical Exam      Exam    General- alert and comfortable    Neck- no JVD, no cervical adenopathy palpable, no  carotid bruit   Lungs- clear without rales, wheezes   Cor- regular rate and rhythm, no murmur , gallop   Abdomen- soft, non-tender   Extremities - warm, non-tender, minimal edema   Neuro- oriented, appropriate, no focal weakness   Diagnostic Tests: PET scan images personally reviewed and counseled with patient showing a  4 cm anterior mediastinal mass with metabolic activity SUV 4.2  Impression: Probable thymic tumor which needs resection.  Probable interstitial lung disease followed by pulmonology Dr. Oliva Bustard needs to be documented with open lung biopsy. Plan: Sternotomy with mediastinal mass resection and lung biopsy[R] today..  Procedure indications benefits alternatives and risks have been discussed in detail with the patient and her husband.   Len Childs, MD [facility] [phone]

## 2017-08-07 NOTE — Brief Op Note (Signed)
08/07/2017  1:40 PM  PATIENT:  Alison Ayala  72 y.o. female  PRE-OPERATIVE DIAGNOSIS:  MEDIASTINAL MASS POSSIBLE PULMONARY FIBROSIS  POST-OPERATIVE DIAGNOSIS:  MEDIASTINAL MASS POSSIBLE PULMONARY FIBROSIS  PROCEDURE:  Procedure(s): Open LUNG BIOPSY (Right) STERNOTOMY (N/A) RESECTION OF MEDIASTINAL MASS (N/A)  SURGEON:  Surgeon(s) and Role:    Ivin Poot, MD - Primary  PHYSICIAN ASSISTANT:  Nicholes Rough, PA-C   ANESTHESIA:   general  EBL:  150 mL   BLOOD ADMINISTERED:none  DRAINS: ONE BLAKE AND ONE STRAIGHT CHEST TUBE   LOCAL MEDICATIONS USED:  NONE  SPECIMEN:  Source of Specimen:  RIGHT MIDDLE LOBE AND RIGHT UPPER LOBE BIOPIES  DISPOSITION OF SPECIMEN:  PATHOLOGY  COUNTS:  YES  TOURNIQUET:  * No tourniquets in log *  DICTATION: .Dragon Dictation  PLAN OF CARE: Admit to inpatient   PATIENT DISPOSITION:  ICU - extubated and stable.   Delay start of Pharmacological VTE agent (>24hrs) due to surgical blood loss or risk of bleeding: yes

## 2017-08-07 NOTE — Transfer of Care (Signed)
Immediate Anesthesia Transfer of Care Note  Patient: Alison Ayala  Procedure(s) Performed: Open LUNG BIOPSY (Right Chest) STERNOTOMY (N/A Chest) RESECTION OF MEDIASTINAL MASS (N/A Chest)  Patient Location: PACU  Anesthesia Type:General  Level of Consciousness: drowsy  Airway & Oxygen Therapy: Patient connected to face mask oxygen  Post-op Assessment: Report given to RN and Post -op Vital signs reviewed and stable  Post vital signs: stable  Last Vitals:  Vitals Value Taken Time  BP 148/71 08/07/2017  1:56 PM  Temp    Pulse 77 08/07/2017  1:58 PM  Resp 17 08/07/2017  1:58 PM  SpO2 96 % 08/07/2017  1:58 PM  Vitals shown include unvalidated device data.  Last Pain:  Vitals:   08/07/17 0856  TempSrc:   PainSc: 0-No pain         Complications: No apparent anesthesia complications

## 2017-08-07 NOTE — Progress Notes (Signed)
Patient ID: Alison Ayala, female   DOB: 08/29/1945, 72 y.o.   MRN: [id number]  TCTS Evening Rounds:   Hemodynamically stable   sats 97% on [facility]  Urine output good  CT output low  CBC    Component Value Date/Time   WBC 5.0 08/01/2017 1317   RBC 4.49 08/01/2017 1317   HGB 14.1 08/01/2017 1317   HCT 43.1 08/01/2017 1317   PLT 245 08/01/2017 1317   MCV 96.0 08/01/2017 1317   MCH 31.4 08/01/2017 1317   MCHC 32.7 08/01/2017 1317   RDW 13.2 08/01/2017 1317     BMET    Component Value Date/Time   NA 141 08/01/2017 1317   K 4.1 08/01/2017 1317   CL 109 08/01/2017 1317   CO2 24 08/01/2017 1317   GLUCOSE 105 (H) 08/01/2017 1317   BUN 14 08/01/2017 1317   CREATININE 0.81 08/01/2017 1317   CALCIUM 9.7 08/01/2017 1317   GFRNONAA >60 08/01/2017 1317   GFRAA >60 08/01/2017 1317     A/P:  Stable postop course. Continue current plans

## 2017-08-08 ENCOUNTER — Inpatient Hospital Stay (HOSPITAL_COMMUNITY): Payer: Medicare HMO

## 2017-08-08 ENCOUNTER — Encounter (HOSPITAL_COMMUNITY): Payer: Self-pay | Admitting: Cardiothoracic Surgery

## 2017-08-08 ENCOUNTER — Other Ambulatory Visit: Payer: Self-pay

## 2017-08-08 DIAGNOSIS — Z9889 Other specified postprocedural states: Secondary | ICD-10-CM

## 2017-08-08 LAB — COMPREHENSIVE METABOLIC PANEL
ALT: 12 U/L (ref 0–44)
AST: 20 U/L (ref 15–41)
Albumin: 3.4 g/dL — ABNORMAL LOW (ref 3.5–5.0)
Alkaline Phosphatase: 73 U/L (ref 38–126)
Anion gap: 8 (ref 5–15)
BUN: 15 mg/dL (ref 8–23)
CO2: 24 mmol/L (ref 22–32)
Calcium: 9.3 mg/dL (ref 8.9–10.3)
Chloride: 107 mmol/L (ref 98–111)
Creatinine, Ser: 0.85 mg/dL (ref 0.44–1.00)
GFR calc Af Amer: >60 mL/min (ref >60)
GFR calc non Af Amer: >60 mL/min (ref >60)
Glucose, Bld: 220 mg/dL — ABNORMAL HIGH (ref 70–99)
Potassium: 3.7 mmol/L (ref 3.5–5.1)
Sodium: 139 mmol/L (ref 135–145)
Total Bilirubin: 0.8 mg/dL (ref 0.3–1.2)
Total Protein: 5.7 g/dL — ABNORMAL LOW (ref 6.5–8.1)

## 2017-08-08 LAB — BASIC METABOLIC PANEL
Anion gap: 5 (ref 5–15)
BUN: 15 mg/dL (ref 8–23)
CO2: 26 mmol/L (ref 22–32)
Calcium: 9.3 mg/dL (ref 8.9–10.3)
Chloride: 108 mmol/L (ref 98–111)
Creatinine, Ser: 0.73 mg/dL (ref 0.44–1.00)
GFR calc Af Amer: >60 mL/min (ref >60)
GFR calc non Af Amer: >60 mL/min (ref >60)
Glucose, Bld: 158 mg/dL — ABNORMAL HIGH (ref 70–99)
Potassium: 3.8 mmol/L (ref 3.5–5.1)
Sodium: 139 mmol/L (ref 135–145)

## 2017-08-08 LAB — CBC
HCT: 39.1 % (ref 36.0–46.0)
Hemoglobin: 12.6 g/dL (ref 12.0–15.0)
MCH: 31.1 pg (ref 26.0–34.0)
MCHC: 32.2 g/dL (ref 30.0–36.0)
MCV: 96.5 fL (ref 78.0–100.0)
Platelets: 213 10*3/uL (ref 150–400)
RBC: 4.05 MIL/uL (ref 3.87–5.11)
RDW: 13.1 % (ref 11.5–15.5)
WBC: 13.3 10*3/uL — ABNORMAL HIGH (ref 4.0–10.5)

## 2017-08-08 LAB — GLUCOSE, CAPILLARY
Glucose-Capillary: 138 mg/dL — ABNORMAL HIGH (ref 70–99)
Glucose-Capillary: 152 mg/dL — ABNORMAL HIGH (ref 70–99)
Glucose-Capillary: 173 mg/dL — ABNORMAL HIGH (ref 70–99)

## 2017-08-08 LAB — BLOOD GAS, ARTERIAL
Acid-Base Excess: 0.7 mmol/L (ref 0.0–2.0)
Bicarbonate: 25.4 mmol/L (ref 20.0–28.0)
O2 Content: 2 L/min
O2 Saturation: 97.7 %
Patient temperature: 98.6
pCO2 arterial: 44.9 mmHg (ref 32.0–48.0)
pH, Arterial: 7.371 (ref 7.350–7.450)
pO2, Arterial: 101 mmHg (ref 83.0–108.0)

## 2017-08-08 MED ORDER — FUROSEMIDE 10 MG/ML IJ SOLN
20.0000 mg | Freq: Once | INTRAMUSCULAR | Status: AC
  Administered 2017-08-08: 20 mg via INTRAVENOUS
  Filled 2017-08-08: qty 2

## 2017-08-08 MED ORDER — AMLODIPINE BESYLATE 5 MG PO TABS
5.0000 mg | ORAL_TABLET | Freq: Every day | ORAL | Status: DC
  Administered 2017-08-08 – 2017-08-12 (×5): 5 mg via ORAL
  Filled 2017-08-08 (×5): qty 1

## 2017-08-08 NOTE — Discharge Instructions (Signed)
Discharge Instructions:  1. You may shower, please wash incisions daily with soap and water and keep dry.  If you wish to cover wounds with dressing you may do so but please keep clean and change daily.  No tub baths or swimming until incisions have completely healed.  If your incisions become red or develop any drainage please call our office at [phone]  2. No Driving until cleared by Dr. Lucianne Lei Trigt's  office and you are no longer using narcotic pain medications  3. Fever of 101.5 for at least 24 hours with no source, please contact our office at [phone]  4. Activity- up as tolerated, please walk at least 3 times per day.  Avoid strenuous activity, no lifting, pushing, or pulling with your arms over 8-10 lbs for a minimum of 6 weeks  5. If any questions or concerns arise, please do not hesitate to contact our office at [phone]

## 2017-08-08 NOTE — Discharge Summary (Addendum)
Physician Discharge Summary  Patient ID: Alison Ayala MRN: [id number] DOB/AGE: May 13, 1945 72 y.o.  Admit date: 08/07/2017 Discharge date: 08/12/2017  Admission Diagnoses:  Patient Active Problem List   Diagnosis Date Noted  . Mediastinal mass 08/07/2017  . Essential hypertension 07/23/2017  . SOB (shortness of breath) 07/23/2017   Discharge Diagnoses:   Patient Active Problem List   Diagnosis Date Noted  . History of lung biopsy 08/08/2017  . Mediastinal mass 08/07/2017  . Essential hypertension 07/23/2017   S/P Median Sternotomy    Resection of Anterior Mediastinal Mass   . SOB (shortness of breath) 07/23/2017   Discharged Condition: good  Pathology: Results pending  History of Present Illness:  Alison Ayala is a 72 yo obese white female referred to [facility] for evaluation for resection of anterior mediastinal mass and lung biopsies for Interstitial lung disease.  The patient has been followed by Dr. Onnie Graham for complaints of dyspnea on exertion and cough.  She underwent workup with CT scan which showed basilar parenchymal changes and an incidental findings of a fairly large anterior mediastinal mass in the mid mediastinum at the level of the right atrium anterior to the pericardium.  The patient is a life long non-smoker and has never had a previous diagnosis of cancer.  She was evaluated by Dr. Prescott Gum who felt PET CT scan should be obtained as well as an alpha fetoprotein level.  PET CT scan showed mild activity of 4.5 units consistent with a slow growing thymic tumor.  There was no evidence of metastatic disease.  Her AFP level was 4.0.  It was felt the patient should undergo sternotomy with resection of her mediastinal mass, as well as lung biopsies to possibly diagnose her ILD.  The risks and benefits of the procedure were explained to the patient and she was agreeable to proceed.      Hospital Course:   Alison Ayala presented to [facility] on 08/07/2017.  She was taken to the operating room and underwent Median Sternotomy with resection of anterior mediastinal mass and lung biopsy for right upper lobe and right middle lobe.  She tolerated the procedure, was extubated and taken to the PACU in stable condition.  She has done well post operatively.  Her arterial line was removed without difficulty.  She had minimal chest tube output post operatively.  She did not have an air leak and her chest tubes were removed on 08/08/2017.  Follow up CXR showed low lung volumes, bibasilar atelectasis, no pneumothorax, and small pleural effusions (improved from previous CXR).  She did desat on room air with ambulation into the mid 80's. She was weaned from 3 liters via [place] to room air. CXR on 07/07 showed small bilateral pleural effusions. She continued to make progress.  She remained hemodynamically stable in NSR.  Her pain is well controlled with oral medications. She is tolerating a regular diet.  am. Aquacel was removed and her sternal incision is clean and dry. There are no signs of infection. She is medically stable for discharge home today.    Significant Diagnostic Studies:   Treatments: surgery:   1.  Median sternotomy for resection of large anterior mediastinal tumor, thymoma 6 cm. 2.  Open lung biopsy of right upper lobe and right middle lobe for concern over interstitial lung disease on 08/07/2017 by Dr. Prescott Gum.  Discharge Exam: Blood pressure 137/68, pulse 73, temperature 98.2 F (36.8 C), temperature source Oral, resp. rate Marland Kitchen)  22, height 5\' 4"  (1.626 m), weight 222 lb 0.1 oz (100.7 kg), SpO2 95 %.   Cardiovascular: RRR Pulmonary: Clear to auscultation bilaterally Abdomen: Soft, non tender, bowel sounds present. Extremities: Mild bilateral lower extremity edema. Wounds: Aquacel removed and wound is clean and dry.  No erythema or signs of infection.  Disposition: Discharge disposition: 01-Home or Self Care     Stable and  discharged to home  Discharge Medications:   Allergies as of 08/12/2017      Reactions   Carvedilol Palpitations, Other (See Comments)   Abdominal bloating   Atorvastatin    fatigue   Claritin [loratadine] Other (See Comments)   Blurred vision and dry eyes   Lisinopril Other (See Comments)   "drunk feeling"      Medication List    TAKE these medications   acetaminophen 500 MG tablet Commonly known as:  TYLENOL Take 1-2 tablets (500-1,000 mg total) by mouth 2 (two) times daily as needed for moderate pain or headache. What changed:  how much to take   amLODipine 5 MG tablet Commonly known as:  NORVASC Take 5 mg by mouth daily.   BLINK TEARS OP Place 1 drop into both eyes daily as needed (dry eyes).   buPROPion 300 MG 24 hr tablet Commonly known as:  WELLBUTRIN XL Take 300 mg by mouth daily.   esomeprazole 40 MG capsule Commonly known as:  NEXIUM Take 40 mg by mouth daily.   hydrochlorothiazide 25 MG tablet Commonly known as:  HYDRODIURIL Take 25 mg by mouth daily.   ibuprofen 200 MG tablet Commonly known as:  ADVIL,MOTRIN Take 400-600 mg by mouth 2 (two) times daily as needed for headache or moderate pain.   LIQUID TEARS OP Place 1 drop into both eyes daily as needed (dry eyes).   metoprolol succinate 100 MG 24 hr tablet Commonly known as:  TOPROL-XL Take 100 mg by mouth daily. Take with or immediately following a meal.   pravastatin 80 MG tablet Commonly known as:  PRAVACHOL Take 1 tablet (80 mg total) by mouth daily.   traMADol 50 MG tablet Commonly known as:  ULTRAM Take 50 mg by mouth every 4-6 hours PRN severe pain      Follow-up Information    Prescott Gum, Collier Salina, MD. Go on 09/12/2017.   Specialty:  Cardiothoracic Surgery Why:  PA/LAT CXR to be taken (at [facility] which is in the same building as Dr. Lucianne Lei Trigt's office) on 09/12/2017 at 12:30 pm;Appointment time is at 1:00 pm Contact information: [address] [place]  [address] [phone]        Nurse. Go on 08/21/2017.   Why:  Appointment is with nurse for chest tube suture removal only and time is at 10:30 am Contact information: [place] [address]          Signed: Arnoldo Lenis 08/12/2017, 9:17 AM  patient examined and medical record reviewed,agree with above note. Tharon Aquas Trigt III 08/19/2017

## 2017-08-08 NOTE — Anesthesia Postprocedure Evaluation (Signed)
Anesthesia Post Note  Patient: Alison Ayala  Procedure(s) Performed: Open LUNG BIOPSY (Right Chest) STERNOTOMY (N/A Chest) RESECTION OF MEDIASTINAL MASS (N/A Chest)     Patient location during evaluation: PACU Anesthesia Type: General Level of consciousness: awake and alert Pain management: pain level controlled Vital Signs Assessment: post-procedure vital signs reviewed and stable Respiratory status: spontaneous breathing, nonlabored ventilation, respiratory function stable and patient connected to nasal cannula oxygen Cardiovascular status: blood pressure returned to baseline and stable Postop Assessment: no apparent nausea or vomiting Anesthetic complications: no    Last Vitals:  Vitals:   08/08/17 1245 08/08/17 1300  BP: (!) 144/58 (!) 129/57  Pulse: 74 71  Resp: (!) 23 18  Temp:    SpO2: 99% 98%    Last Pain:  Vitals:   08/08/17 1201  TempSrc: Oral  PainSc:                  Ryan P Ellender

## 2017-08-08 NOTE — Care Management Note (Signed)
Case Management Note Marvetta Gibbons RN,BSN [facility]22 Case Manager  [phone]  Patient Details  Name: DEVINA BEZOLD MRN: [id number] Date of Birth: 16-May-1945  Subjective/Objective:   Pt admitted s/p Open LUNG BIOPSY (Right) STERNOTOMY (N/A) RESECTION OF MEDIASTINAL MASS                 Action/Plan: PTA pt lived at home with spouse, CM to follow for transition of care needs  Expected Discharge Date:  08/14/17               Expected Discharge Plan:  Home/Self Care  In-House Referral:     Discharge planning Services  CM Consult  Post Acute Care Choice:    Choice offered to:     DME Arranged:    DME Agency:     HH Arranged:    HH Agency:     Status of Service:  In process, will continue to follow  If discussed at Long Length of Stay Meetings, dates discussed:    Discharge Disposition:   Additional Comments:  Dawayne Patricia, RN 08/08/2017, 10:41 AM

## 2017-08-08 NOTE — Progress Notes (Addendum)
[facility] DAILY ICU PROGRESS NOTE                   [address]            [place] [address]          [phone]   1 Day Post-Op Procedure(s) (LRB): Open LUNG BIOPSY (Right) STERNOTOMY (N/A) RESECTION OF MEDIASTINAL MASS (N/A)  Total Length of Stay:  LOS: 1 day   Subjective:  Patient states doing okay.  She looks great, up in chair eating breakfast.  Pain is controlled.  Denies N/V  Objective: Vital signs in last 24 hours: Temp:  [97.3 F (36.3 C)-98.7 F (37.1 C)] 98.7 F (37.1 C) (07/03 0445) Pulse Rate:  [66-91] 69 (07/03 0700) Cardiac Rhythm: Normal sinus rhythm (07/03 0400) Resp:  [11-20] 20 (07/03 0700) BP: (103-158)/(52-71) 130/60 (07/03 0700) SpO2:  [91 %-100 %] 93 % (07/03 0700) Arterial Line BP: (111-184)/(45-93) 169/47 (07/03 0700) Weight:  [224 lb 14.4 oz (102 kg)] 224 lb 14.4 oz (102 kg) (07/02 0846)  Filed Weights   08/07/17 0846  Weight: 224 lb 14.4 oz (102 kg)    Weight change:    Intake/Output from previous day: 07/02 0701 - 07/03 0700 In: 3276.4 [P.O.:240; I.V.:2835.5; IV Piggyback:199.9] Out: 2458 [Urine:1480; Blood:150; Chest Tube:136]  Current Meds: Scheduled Meds: . acetaminophen  1,000 mg Oral Q6H   Or  . acetaminophen (TYLENOL) oral liquid 160 mg/5 mL  1,000 mg Oral Q6H  . bisacodyl  10 mg Oral Daily  . buPROPion  300 mg Oral Daily  . pravastatin  80 mg Oral Daily  . senna-docusate  1 tablet Oral QHS   Continuous Infusions: . dextrose 5 % and 0.45% NaCl 100 mL/hr at 08/08/17 0437  . potassium chloride     PRN Meds:.artificial tears, fentaNYL (SUBLIMAZE) injection, metoCLOPramide (REGLAN) injection, ondansetron (ZOFRAN) IV, oxyCODONE, potassium chloride, promethazine, traMADol  General appearance: alert, cooperative and no distress Heart: regular rate and rhythm Lungs: clear to auscultation bilaterally Abdomen: soft, non-tender; bowel sounds normal; no masses,  no organomegaly Extremities: extremities normal,  atraumatic, no cyanosis or edema Wound: aquacel in place on sternotomy  Lab Results: CBC: Recent Labs    08/08/17 0437  WBC 13.3*  HGB 12.6  HCT 39.1  PLT 213   BMET:  Recent Labs    08/08/17 0437  NA 139  K 3.8  CL 108  CO2 26  GLUCOSE 158*  BUN 15  CREATININE 0.73  CALCIUM 9.3    CMET: Lab Results  Component Value Date   WBC 13.3 (H) 08/08/2017   HGB 12.6 08/08/2017   HCT 39.1 08/08/2017   PLT 213 08/08/2017   GLUCOSE 158 (H) 08/08/2017   ALT 14 08/01/2017   AST 21 08/01/2017   NA 139 08/08/2017   K 3.8 08/08/2017   CL 108 08/08/2017   CREATININE 0.73 08/08/2017   BUN 15 08/08/2017   CO2 26 08/08/2017   INR 1.00 08/01/2017      PT/INR: No results for input(s): LABPROT, INR in the last 72 hours. Radiology: Dg Chest 2 View  Result Date: 08/07/2017 CLINICAL DATA:  Preoperative exam. Mediastinal mass resection. VP shunt. EXAM: CHEST - 2 VIEW COMPARISON:  PET-CT 07/16/2017. FINDINGS: VP shunt noted over the right chest. Mediastinal mass best identified by prior PET-CT. Mild left mid lung and bibasilar atelectatic changes again noted. No pleural effusion or pneumothorax. Heart size normal. Thoracic spine scoliosis. No acute bony abnormality. IMPRESSION: No acute  cardiopulmonary disease identified. Mediastinal mass best identified by prior PET-CT. Electronically Signed   By: Marcello Moores  Register   On: 08/07/2017 09:07   Dg Chest Port 1 View  Result Date: 08/07/2017 CLINICAL DATA:  Status post resection of mediastinal mass and open lung biopsy. EXAM: PORTABLE CHEST 1 VIEW COMPARISON:  08/07/2017 chest radiograph and prior studies FINDINGS: Median sternotomy wires now noted. A RIGHT IJ central venous catheter with tip overlying the mid SVC, and mediastinal and RIGHT thoracostomy tubes are now noted. There is no evidence of pneumothorax. Pulmonary vascular congestion and bibasilar atelectasis identified. IMPRESSION: Status post median sternotomy and thoracic surgery with  support apparatus described. No pneumothorax. Pulmonary vascular congestion and bibasilar atelectasis. Electronically Signed   By: Margarette Canada M.D.   On: 08/07/2017 15:08     Assessment/Plan: S/P Procedure(s) (LRB): Open LUNG BIOPSY (Right) STERNOTOMY (N/A) RESECTION OF MEDIASTINAL MASS (N/A)  1.  CV- Sinus Bradycardia,  Mild HTN- will restart home Norvasc, hold off on Toprol XL for now 2. Pulm- no acute issues, no air leak on chest tube, minimal output, CXR shows some atelectasis, no pneumothorax, effusion- can likely d/c chest tubes today 3. Renal- creatinine WNL, weight is stable, K is WNL 4. Expected post operative blood loss anemia, mild Hgb 12.6 5. D/c Arterial Line 6. IV fluid to KVO 7. Stop SSIP, CBGs- patient is not diabetic 8. Dispo- patient looks great, restart home Norvasc for BP control, d/c arterial line, likely d/c chest tubes, transfer to 4E     Erin Barrett 08/08/2017 7:39 AM   Pain well controlled Pulmonary status stable  Will DC chest tubes Tx  2C or 4E  patient examined and medical record reviewed,agree with above note. Tharon Aquas Trigt III 08/08/2017

## 2017-08-09 ENCOUNTER — Inpatient Hospital Stay (HOSPITAL_COMMUNITY): Payer: Medicare HMO

## 2017-08-09 LAB — COMPREHENSIVE METABOLIC PANEL
ALT: 28 U/L (ref 0–44)
AST: 47 U/L — ABNORMAL HIGH (ref 15–41)
Albumin: 3.4 g/dL — ABNORMAL LOW (ref 3.5–5.0)
Alkaline Phosphatase: 79 U/L (ref 38–126)
Anion gap: 6 (ref 5–15)
BUN: 17 mg/dL (ref 8–23)
CO2: 29 mmol/L (ref 22–32)
Calcium: 9.5 mg/dL (ref 8.9–10.3)
Chloride: 106 mmol/L (ref 98–111)
Creatinine, Ser: 0.79 mg/dL (ref 0.44–1.00)
GFR calc Af Amer: >60 mL/min (ref >60)
GFR calc non Af Amer: >60 mL/min (ref >60)
Glucose, Bld: 119 mg/dL — ABNORMAL HIGH (ref 70–99)
Potassium: 4.1 mmol/L (ref 3.5–5.1)
Sodium: 141 mmol/L (ref 135–145)
Total Bilirubin: 0.9 mg/dL (ref 0.3–1.2)
Total Protein: 5.9 g/dL — ABNORMAL LOW (ref 6.5–8.1)

## 2017-08-09 LAB — CBC
HCT: 38.4 % (ref 36.0–46.0)
Hemoglobin: 12.1 g/dL (ref 12.0–15.0)
MCH: 31.3 pg (ref 26.0–34.0)
MCHC: 31.5 g/dL (ref 30.0–36.0)
MCV: 99.2 fL (ref 78.0–100.0)
Platelets: 199 10*3/uL (ref 150–400)
RBC: 3.87 MIL/uL (ref 3.87–5.11)
RDW: 13.5 % (ref 11.5–15.5)
WBC: 10.2 10*3/uL (ref 4.0–10.5)

## 2017-08-09 MED ORDER — METOPROLOL SUCCINATE ER 100 MG PO TB24
100.0000 mg | ORAL_TABLET | Freq: Every day | ORAL | Status: DC
  Administered 2017-08-09 – 2017-08-12 (×4): 100 mg via ORAL
  Filled 2017-08-09 (×4): qty 1

## 2017-08-09 MED ORDER — HYDROCHLOROTHIAZIDE 25 MG PO TABS
25.0000 mg | ORAL_TABLET | Freq: Every day | ORAL | Status: DC
  Administered 2017-08-09 – 2017-08-12 (×4): 25 mg via ORAL
  Filled 2017-08-09 (×4): qty 1

## 2017-08-09 NOTE — Progress Notes (Addendum)
[address]       [place] [address]             [phone]      2 Days Post-Op Procedure(s) (LRB): Open LUNG BIOPSY (Right) STERNOTOMY (N/A) RESECTION OF MEDIASTINAL MASS (N/A) Subjective: Feels ok, no specific c/o  Objective: Vital signs in last 24 hours: Temp:  [98 F (36.7 C)-99.1 F (37.3 C)] 99.1 F (37.3 C) (07/04 0739) Pulse Rate:  [66-102] 98 (07/04 0739) Cardiac Rhythm: Normal sinus rhythm (07/04 0756) Resp:  [14-24] 21 (07/04 0739) BP: (112-158)/(52-76) 153/76 (07/04 0739) SpO2:  [87 %-99 %] 95 % (07/04 0739)  Hemodynamic parameters for last 24 hours:    Intake/Output from previous day: 07/03 0701 - 07/04 0700 In: 898.1 [P.O.:720; I.V.:178.1] Out: 6283 [Urine:2400; Chest Tube:30] Intake/Output this shift: No intake/output data recorded.  General appearance: alert, cooperative and no distress Heart: regular rate and rhythm Lungs: mildly dim in bases Abdomen: benign Extremities: minor edema Wound: dressing CDI  Lab Results: Recent Labs    08/08/17 0437 08/09/17 0445  WBC 13.3* 10.2  HGB 12.6 12.1  HCT 39.1 38.4  PLT 213 199   BMET:  Recent Labs    08/08/17 0819 08/09/17 0445  NA 139 141  K 3.7 4.1  CL 107 106  CO2 24 29  GLUCOSE 220* 119*  BUN 15 17  CREATININE 0.85 0.79  CALCIUM 9.3 9.5    PT/INR: No results for input(s): LABPROT, INR in the last 72 hours. ABG    Component Value Date/Time   PHART 7.371 08/08/2017 0450   HCO3 25.4 08/08/2017 0450   O2SAT 97.7 08/08/2017 0450   CBG (last 3)  Recent Labs    08/08/17 0003 08/08/17 0419 08/08/17 0740  GLUCAP 173* 152* 138*    Meds Scheduled Meds: . acetaminophen  1,000 mg Oral Q6H   Or  . acetaminophen (TYLENOL) oral liquid 160 mg/5 mL  1,000 mg Oral Q6H  . amLODipine  5 mg Oral Daily  . bisacodyl  10 mg Oral Daily  . buPROPion  300 mg Oral Daily  . pravastatin  80 mg Oral Daily  . senna-docusate  1 tablet Oral QHS   Continuous Infusions: .  dextrose 5 % and 0.45% NaCl Stopped (08/08/17 1359)  . potassium chloride 10 mEq (08/08/17 1117)   PRN Meds:.artificial tears, fentaNYL (SUBLIMAZE) injection, metoCLOPramide (REGLAN) injection, ondansetron (ZOFRAN) IV, oxyCODONE, potassium chloride, promethazine, traMADol  Xrays Dg Chest Port 1 View  Result Date: 08/09/2017 CLINICAL DATA:  Chest tube removal EXAM: PORTABLE CHEST 1 VIEW COMPARISON:  08/08/2017 FINDINGS: Right-sided chest tube as well as a mediastinal drain have been removed in the interval from the prior exam. No recurrent pneumothorax is noted. Right jugular central line and ventriculoperitoneal shunt catheter are again noted. Stable left basilar atelectasis is noted. New right basilar atelectasis with small right effusion is seen. IMPRESSION: No pneumothorax following chest tube removal. Mild right basilar atelectasis and small effusion are seen. Electronically Signed   By: Inez Catalina M.D.   On: 08/09/2017 07:52   Dg Chest Port 1 View  Result Date: 08/08/2017 CLINICAL DATA:  72 year old female postoperative day 1 status post median sternotomy for resection of large anterior mediastinal thymoma, open lung biopsy of right upper and middle lobes. EXAM: PORTABLE CHEST 1 VIEW COMPARISON:  08/07/2017 and earlier. FINDINGS: Portable AP semi upright view at 0547 hours. Mediastinal and right chest tubes remain in place. Right IJ approach central line  is stable and superimposed on right anterior chest wall CSF shunt catheter. Mildly improved lung volumes. Stable cardiac size and mediastinal contours. Perihilar and left lung base opacity has not significantly changed. No pulmonary edema. No definite pleural effusion. Negative visible bowel gas pattern. IMPRESSION: 1.  Stable lines and tubes. 2. No pneumothorax. Mildly improved lung volumes with continued atelectasis. Electronically Signed   By: Genevie Ann M.D.   On: 08/08/2017 08:56   Dg Chest Port 1 View  Result Date: 08/07/2017 CLINICAL DATA:   Status post resection of mediastinal mass and open lung biopsy. EXAM: PORTABLE CHEST 1 VIEW COMPARISON:  08/07/2017 chest radiograph and prior studies FINDINGS: Median sternotomy wires now noted. A RIGHT IJ central venous catheter with tip overlying the mid SVC, and mediastinal and RIGHT thoracostomy tubes are now noted. There is no evidence of pneumothorax. Pulmonary vascular congestion and bibasilar atelectasis identified. IMPRESSION: Status post median sternotomy and thoracic surgery with support apparatus described. No pneumothorax. Pulmonary vascular congestion and bibasilar atelectasis. Electronically Signed   By: Margarette Canada M.D.   On: 08/07/2017 15:08    Assessment/Plan: S/P Procedure(s) (LRB): Open LUNG BIOPSY (Right) STERNOTOMY (N/A) RESECTION OF MEDIASTINAL MASS (N/A)  1 conts to do well, + HTN and tachy- restart betablocker and hydrodiuril, Norvasc already restarted 2 wean O2 off, routine pulm toilet/IS, ambulate per protocol. CXR with mild effus/atx 3 H/H is stable, leukocytosis resolved 4 renal fxn in normal range 5 mildly elevated AT/ALT 6 path is pending 7 d/c central line and foley 8 prob home in am  LOS: 2 days    Alison Ayala 08/09/2017   Chart reviewed, patient examined, agree with above. She feels well. Continue IS and ambulation today and plan home tomorrow if no changes.

## 2017-08-10 ENCOUNTER — Inpatient Hospital Stay (HOSPITAL_COMMUNITY): Payer: Medicare HMO

## 2017-08-10 LAB — TYPE AND SCREEN
ABO/RH(D): O POS
Antibody Screen: NEGATIVE
Unit division: 0
Unit division: 0

## 2017-08-10 LAB — BPAM RBC
Blood Product Expiration Date: 201908052359
Blood Product Expiration Date: 201908052359
Unit Type and Rh: 5100
Unit Type and Rh: 5100

## 2017-08-10 MED ORDER — TRAMADOL HCL 50 MG PO TABS: ORAL_TABLET | ORAL | 0 refills | Status: DC

## 2017-08-10 MED ORDER — ACETAMINOPHEN 500 MG PO TABS: 500.0000 mg | ORAL_TABLET | Freq: Two times a day (BID) | ORAL | 0 refills | Status: AC | PRN

## 2017-08-10 MED ORDER — PRAVASTATIN SODIUM 80 MG PO TABS: 80.0000 mg | ORAL_TABLET | Freq: Every day | ORAL | Status: DC

## 2017-08-10 MED ORDER — FUROSEMIDE 10 MG/ML IJ SOLN
40.0000 mg | Freq: Every day | INTRAMUSCULAR | Status: AC
  Administered 2017-08-10: 40 mg via INTRAVENOUS
  Filled 2017-08-10 (×2): qty 4

## 2017-08-10 NOTE — Progress Notes (Signed)
SATURATION QUALIFICATIONS: (This note is used to comply with regulatory documentation for home oxygen)  Patient Saturations on Room Air at Rest = 88%  Patient Saturations on Room Air while Ambulating = 84%  Patient Saturations on 3 Liters of oxygen while Ambulating = 90%  Please briefly explain why patient needs home oxygen: O2 saturation was fluctuated between 86 to 91% when patient was resting on the chair with RA. O2 saturation down to 84 while ambulating with RA. Patient needs O2 while resting. HS [employer]

## 2017-08-10 NOTE — Progress Notes (Addendum)
      [address]       [place] [address]             [phone]        3 Days Post-Op Procedure(s) (LRB): Open LUNG BIOPSY (Right) STERNOTOMY (N/A) RESECTION OF MEDIASTINAL MASS (N/A)  Subjective: Patient without complaints this am.  Objective: Vital signs in last 24 hours: Temp:  [98.1 F (36.7 C)-99 F (37.2 C)] 98.6 F (37 C) (07/05 0800) Pulse Rate:  [80-123] 80 (07/05 0300) Cardiac Rhythm: Heart block (07/05 0700) Resp:  [14-22] 21 (07/05 0300) BP: (140-164)/(66-73) 141/69 (07/05 0800) SpO2:  [91 %-94 %] 92 % (07/05 0300)   Current Weight  08/07/17 224 lb 14.4 oz (102 kg)        Intake/Output from previous day: 07/04 0701 - 07/05 0700 In: 120 [P.O.:120] Out: 400 [Urine:400]   Physical Exam:  Cardiovascular: RRR Pulmonary: Clear to auscultation bilaterally Abdomen: Soft, non tender, bowel sounds present. Extremities: Mild bilateral lower extremity edema. Wounds: Aquacel removed and wound is clean and dry.  No erythema or signs of infection.  Lab Results: CBC: Recent Labs    08/08/17 0437 08/09/17 0445  WBC 13.3* 10.2  HGB 12.6 12.1  HCT 39.1 38.4  PLT 213 199   BMET:  Recent Labs    08/08/17 0819 08/09/17 0445  NA 139 141  K 3.7 4.1  CL 107 106  CO2 24 29  GLUCOSE 220* 119*  BUN 15 17  CREATININE 0.85 0.79  CALCIUM 9.3 9.5    PT/INR:  Lab Results  Component Value Date   INR 1.00 08/01/2017   ABG:  INR: Will add last result for INR, ABG once components are confirmed Will add last 4 CBG results once components are confirmed  Assessment/Plan:  1. CV - First degree heart block. On Amlodipine 5 mg daily, Toprol XL 100 mg daily, and HCTZ 25 mg daily. 2.  Pulmonary - On room air.  Encourage incentive spirometer. Await final pathology. 3. Volume Overload - On Lasix 40 mg daily 4.  Mildly elevated AST at 47 yesterday. On Pravastatin 80 mg daily. Will stop and restart after discharge. 6. Patient wishes to go  home today. CXR is stable and/or improved;however, she does desat in to the mid 80's on room air. As discussed with Dr. Prescott Gum, will not discharge today.  Kanetra Ho M ZimmermanPA-C 08/10/2017,9:24 AM [phone]

## 2017-08-11 MED ORDER — FUROSEMIDE 10 MG/ML IJ SOLN
40.0000 mg | Freq: Once | INTRAMUSCULAR | Status: AC
Start: 2017-08-11 — End: 2017-08-11
  Administered 2017-08-11: 40 mg via INTRAVENOUS
  Filled 2017-08-11: qty 4

## 2017-08-11 MED ORDER — BISACODYL 10 MG RE SUPP
10.0000 mg | Freq: Once | RECTAL | Status: AC
  Administered 2017-08-11: 10 mg via RECTAL
  Filled 2017-08-11: qty 1

## 2017-08-11 NOTE — Progress Notes (Addendum)
      [address]       [place] [address]             [phone]        4 Days Post-Op Procedure(s) (LRB): Open LUNG BIOPSY (Right) STERNOTOMY (N/A) RESECTION OF MEDIASTINAL MASS (N/A)  Subjective: Patient had nausea earlier. She was given a suppository and had bowel movement and feels better now. She is eating breakfast. She would like to go home.   Objective: Vital signs in last 24 hours: Temp:  [97.9 F (36.6 C)-98.5 F (36.9 C)] 98.2 F (36.8 C) (07/06 0700) Pulse Rate:  [71-93] 79 (07/06 0700) Cardiac Rhythm: Normal sinus rhythm;Heart block (07/06 0700) Resp:  [17-26] 22 (07/06 0700) BP: (132-163)/(67-79) 132/74 (07/06 0700) SpO2:  [86 %-97 %] 88 % (07/06 0700) Weight:  [224 lb 6.4 oz (101.8 kg)-225 lb 12 oz (102.4 kg)] 224 lb 6.4 oz (101.8 kg) (07/06 9150)   Current Weight  08/11/17 224 lb 6.4 oz (101.8 kg)      Intake/Output from previous day: 07/05 0701 - 07/06 0700 In: 100 [P.O.:100] Out: 2550 [Urine:2550]   Physical Exam:  Cardiovascular: RRR Pulmonary: Slightly diminished at bases Abdomen: Soft, non tender, bowel sounds present. Extremities: Mild bilateral lower extremity edema. Wounds: Sternal wound is clean and dry.  No erythema or signs of infection.  Lab Results: CBC: Recent Labs    08/09/17 0445  WBC 10.2  HGB 12.1  HCT 38.4  PLT 199   BMET:  Recent Labs    08/09/17 0445  NA 141  K 4.1  CL 106  CO2 29  GLUCOSE 119*  BUN 17  CREATININE 0.79  CALCIUM 9.5    PT/INR:  Lab Results  Component Value Date   INR 1.00 08/01/2017   ABG:  INR: Will add last result for INR, ABG once components are confirmed Will add last 4 CBG results once components are confirmed  Assessment/Plan:  1. CV - First degree heart block. On Amlodipine 5 mg daily, Toprol XL 100 mg daily, and HCTZ 25 mg daily. 2.  Pulmonary - On room air this am but does desat with ambulation into the mid 80's. As a result, has needed 3 liters of  oxygen via [place]. Although CXR improved yesterday, will repeat in am. Encourage incentive spirometer and flutter valve. Await final pathology. 3. Volume Overload - Has been given Lasix 40 mg IV yesterday and today with good diuresis. 4.  Mildly elevated AST at 47 yesterday. On Pravastatin 80 mg daily. Will stop and restart after discharge. 6. Possible discharge in am if oxygen requirements decreased  Donielle M ZimmermanPA-C 08/11/2017,8:53 AM [phone]   Chart reviewed, patient examined, agree with above. She says she is walking without oxygen and feels fine. sats 97% right now on RA. Will work on IS and flutter today and if sats ok she can go home tomorrow.

## 2017-08-11 NOTE — Progress Notes (Signed)
Pt ambulated unit on room air and sats did not drop below 90%. Tolerated well, no shortness of breath. Alison Ayala

## 2017-08-12 ENCOUNTER — Inpatient Hospital Stay (HOSPITAL_COMMUNITY): Payer: Medicare HMO

## 2017-08-12 MED ORDER — FUROSEMIDE 40 MG PO TABS
40.0000 mg | ORAL_TABLET | Freq: Once | ORAL | Status: AC
  Administered 2017-08-12: 40 mg via ORAL
  Filled 2017-08-12: qty 1

## 2017-08-12 MED ORDER — POTASSIUM CHLORIDE CRYS ER 20 MEQ PO TBCR
20.0000 meq | EXTENDED_RELEASE_TABLET | Freq: Once | ORAL | Status: AC
  Administered 2017-08-12: 20 meq via ORAL
  Filled 2017-08-12: qty 1

## 2017-08-12 NOTE — Progress Notes (Addendum)
      [address]       [place] [address]             [phone]        5 Days Post-Op Procedure(s) (LRB): Open LUNG BIOPSY (Right) STERNOTOMY (N/A) RESECTION OF MEDIASTINAL MASS (N/A)  Subjective: Patient has no specific complaints this am and she really wants to go home.  Objective: Vital signs in last 24 hours: Temp:  [98.1 F (36.7 C)-98.4 F (36.9 C)] 98.2 F (36.8 C) (07/07 0837) Pulse Rate:  [73-85] 73 (07/07 0305) Cardiac Rhythm: Normal sinus rhythm;Heart block (07/07 0837) Resp:  [18-24] 22 (07/07 0400) BP: (122-153)/(62-76) 137/68 (07/07 0305) SpO2:  [92 %-95 %] 95 % (07/07 0305) Weight:  [222 lb 0.1 oz (100.7 kg)] 222 lb 0.1 oz (100.7 kg) (07/07 0622)   Current Weight  08/12/17 222 lb 0.1 oz (100.7 kg)      Intake/Output from previous day: 07/06 0701 - 07/07 0700 In: 1200 [P.O.:1200] Out: 1600 [Urine:1600]   Physical Exam:  Cardiovascular: RRR Pulmonary: Slightly diminished at bases Abdomen: Soft, non tender, bowel sounds present. Extremities: Trace bilateral lower extremity edema. Wounds: Sternal wound is clean and dry.  No erythema or signs of infection.  Lab Results: CBC: No results for input(s): WBC, HGB, HCT, PLT in the last 72 hours. BMET:  No results for input(s): NA, K, CL, CO2, GLUCOSE, BUN, CREATININE, CALCIUM in the last 72 hours.  PT/INR:  Lab Results  Component Value Date   INR 1.00 08/01/2017   ABG:  INR: Will add last result for INR, ABG once components are confirmed Will add last 4 CBG results once components are confirmed  Assessment/Plan:  1. CV - First degree heart block. On Amlodipine 5 mg daily, Toprol XL 100 mg daily, and HCTZ 25 mg daily. 2.  Pulmonary - On room air. She is no longer having significant desaturation with ambulation.  CXR this am is stable (no pneumothorax, small bilateral pleural effusions). Encourage incentive spirometer and flutter valve. Await final pathology. 3. Volume  Overload - Has been given Lasix 40 mg IV yesterday and today with good diuresis. 4.  Mildly elevated AST at 47 yesterday. On Pravastatin 80 mg daily. Will stop and restart after discharge. 6. Discharge-will remove chest tube sutures in office after discharge  [place] 08/12/2017,8:43 AM [phone]

## 2017-08-12 NOTE — Progress Notes (Signed)
Patient ambulated 410ft; sats dropped to 88% but recovered quickly.  Tolerated well.

## 2017-08-23 ENCOUNTER — Ambulatory Visit: Payer: Medicare HMO | Admitting: Internal Medicine

## 2017-08-23 ENCOUNTER — Encounter: Payer: Self-pay | Admitting: Internal Medicine

## 2017-08-23 VITALS — BP 136/88 | HR 87 | Ht 64.0 in | Wt 219.0 lb

## 2017-08-23 DIAGNOSIS — J849 Interstitial pulmonary disease, unspecified: Secondary | ICD-10-CM

## 2017-08-23 DIAGNOSIS — J9859 Other diseases of mediastinum, not elsewhere classified: Secondary | ICD-10-CM

## 2017-08-23 DIAGNOSIS — D47Z2 Castleman disease: Secondary | ICD-10-CM

## 2017-08-23 NOTE — Addendum Note (Signed)
Addended by: Lorretta Harp on: 08/23/2017 02:31 PM   Modules accepted: Orders

## 2017-08-23 NOTE — Patient Instructions (Signed)
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

## 2017-08-23 NOTE — Progress Notes (Signed)
Subjective:     Patient ID: Alison Ayala, female   DOB: August 14, 1945, 72 y.o.   MRN: [id number]  HPI HPI  IOV 07/03/2017  Chief Complaint  Patient presents with  . Pulm Consult    Referred by Dr. Harlan Stains for possible ILD. Per patient, she does have some occasional SOB and chest tightness.    Alison Ayala 72 y.o. female [place] [address] -referred for evaluation of potential interstitial lung disease.  According to the patient on routine exam it was felt that the sternal head of the clavicles was a little bit prominent so therefore a chest x-ray was done by the primary care physician which was not revealing.  Subsequently patient underwent CT scan of the chest documented below that I personally visualized and agree with the findings.  The 2 significant findings are anterior mediastinal mass with partial calcification and also possible interstitial lung disease and therefore she has been referred here.  She is denying any acute complaints . SHe has appt with Dr Nils Pyle for anterior mediastinal mass  We performed the pulmonary integrated interstitial lung disease questionnaire and for the symptoms she denied any cough but she did admit to shortness of breath that started insidiously approximately 2 years ago and for the last 6 months is gradually gotten worse.  It is not episodic.  It is present on exertion and relieved by rest.  Today when we walked her she did drop her pulse ox greater than 3 points but did not go below 88% her heart rate did respond but it did not go over 90/min.  On a level 5 scale she rates level 3 dyspnea for picking up and straightening, sweeping and vacuuming walking up a hill and walking upstairs.  She rates a level 2 dyspnea for walking on a level ground at her own pace and walking with appears, making the bed, shopping and doing laundry.  She gives a level 1 dyspnea for standing up and dressing but otherwise all other activities a  level 0.  Review of systems: She has chronic fatigue and arthralgia.  She also has dysphagia for the last few to several years [4-5 years].  She has had dry eyes for the last few decades and dry mouth for the last 1 year.  She admits to acid reflux disease for the last 40 years and on and off cold sores in her mouth.  There is no recurrent fever or weight loss or Raynaud  Past medical history: Positive for acid reflux for several decades but negative for asthma, COPD, heart failure, rheumatoid arthritis, other collagen vascular disease, sleep apnea, immune system disorder, pulmonary hypertension, thyroid disease, stroke, seizures, hepatitis, tuberculosis, kidney disease, pneumonia or blood clots, heart disease pleurisy  Family history of lung disease: Denies any pulmonary fibrosis or any other lung disease  Exposure history: Denies any smoking, drugs of abuse.  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  In particular she denies any occupation that might predispose her to hypersensitivity pneumonitis or inorganic dust related interstitial lung disease.  Pulmonary toxicity history: Extensive drug list causing pulmonary toxicity reviewed.  And negative for all of that     CT chest 06/19/17 = personally visualized the CT chest and agree with the findings of large anterior  mediastinal mass and possible ILD  - IMPRESSION: 1. No focal soft tissue mass or other abnormality to account for the perceived palpable abnormality in the suprasternal region. 2. However, there is a large anterior mediastinal mass which demonstrates heterogeneous enhancement and calcification, with several smaller adjacent lesions. These findings are concerning for potential neoplasm, with primary differential considerations including germ cell neoplasm or lesion of  thymic origin. On PET scan 07/16/17 - 4.6 cm anterior mediastinal mass with dense central calcifications is hypermetabolic. SUV max is 6.55. Findings highly suspicious for a thymic neoplasmThoracic surgical consultation is strongly recommended. 3. 1.7 x 2.0 cm left adrenal nodule is indeterminate. Further characterization with nonemergent adrenal protocol CT scan is recommended in the near future to exclude the possibility of a metastatic lesion. 4. Unusual appearance of the lung parenchyma which is concerning for potential interstitial lung disease. Outpatient referral to pulmonology for further evaluation is suggested, as well as a follow-up high-resolution chest CT in 6-12 months to assess for temporal changes in the appearance of the lung parenchyma. 5. Multiple healing left-sided rib fractures, as above. These are all nondisplaced or minimally displaced. No associated pneumothorax noted at this time.   Electronically Signed   By: Vinnie Langton M.D.   On: 06/20/2017 09:20   Results for [name], [name] (MRN [id number]) as of 07/03/2017 13:34  Ref. Range 07/26/2011 08:59  Hemoglobin Latest Ref Range: 12.0 - 15.0 g/dL 20.7 (H)    OV 08/23/2017  Chief Complaint  Patient presents with  . Follow-up    Pt was hospitalized 7/2-7/7 due to having mediastinal mass resection and a lung biopsy was also performed. HRCT performed 6/3, PFT performed 6/5, and PET scan performed 6/10.  Pt states overall she has been doing good but is anxious to know about results from biopsy.    Follow-up mediastinal massAnteriorand PET hot Follow-up interstitial lung disease  She has now been discharged from post biopsy. She is doing well. She feels baseline. Shortness of breath with exertion is the same and it is mild. Walking desaturation test is documented below is unchanged. A biopsy date was 08/07/2017. The anterior mediastinal mass shows Castleman's disease. Based on PET scan I suspect this is  unicentric. She has follow-up with thoracic service is pending. She also had open lung biopsy for early interstitial lung disease. The formal report is still pending but I discussed with pathologist Dr. Vic Ripper who feels that this is very early and mild interstitial lung disease without any specific pattern but suspects this is early NSIP. There is no granuloma.   Simple office walk 185 feet x  3 laps goal with forehead probe 07/03/2017  08/23/2017   O2 used Room air Room air  Number laps completed 3 3  Comments about pace  normal  Resting Pulse Ox/HR 96% and 64/min 97% and HR 87/min  Final Pulse Ox/HR 92% and 88/min 92% and HR 100/min  Desaturated </= 88% no0 no  Desaturated <= 3% points yes yes  Got Tachycardic >/= 90/min no yes  Symptoms at end of test Dyspnea reproduced Mild fatigue only  Miscellaneous comments Normal pace slow stable       has a past medical history of Carpal tunnel syndrome of right wrist, Diverticulosis, GERD (gastroesophageal reflux disease), H/O hiatal hernia, H/O intraventricular hemorrhage (2004--  SPONTANEOUS), Heart murmur, Hip pain, right (OCCASIONAL -- PINCHED NERVE), History of gastric ulcer (2001), Hyperlipidemia, Hypertension, Hypoglycemia, MVP (mitral valve prolapse), Normal echocardiogram (30 YRS AGO), S/P ventriculoperitoneal shunt (2004--- PER  PT SHUNT IS WORKING WELL --  NO ISSUES), and Squamous cell cancer of scalp and skin of neck.   reports that she has never smoked. She has never used smokeless tobacco.  Past Surgical History:  Procedure Laterality Date  . BILATERAL FRONTAL INTERVENTRICULAR CATHETER VIA TWIST DRILL HOLE  AUG 2004   SPONTANEOUS SUBARACHNOID  HEMORRHAGE AND  INTRAVENTRICULAR HEMORRHAGE  . CARPAL TUNNEL RELEASE  07/26/2011   Procedure: CARPAL TUNNEL RELEASE;  Surgeon: Magnus Sinning, MD;  Location: [facility];  Service: Orthopedics;  Laterality: Right;  . CATARACT EXTRACTION W/ INTRAOCULAR LENS  IMPLANT,  BILATERAL  2012   BILATERAL  . DUPUYTREN CONTRACTURE RELEASE  07/26/2011   Procedure: DUPUYTREN CONTRACTURE RELEASE;  Surgeon: Magnus Sinning, MD;  Location: [facility];  Service: Orthopedics;  Laterality: Right;  EXCISION OF DUPYTRENS BAND THUMB- INDEX WEB SPACE  . LUNG BIOPSY Right 08/07/2017   Procedure: Open LUNG BIOPSY;  Surgeon: Ivin Poot, MD;  Location: [facility];  Service: Thoracic;  Laterality: Right;  . RESECTION OF MEDIASTINAL MASS N/A 08/07/2017   Procedure: RESECTION OF MEDIASTINAL MASS;  Surgeon: Ivin Poot, MD;  Location: [facility];  Service: Thoracic;  Laterality: N/A;  . RETINAL DETACHMENT REPAIR W/ SCLERAL BUCKLE LE  03-10-2011   LEFT EYE  . STERNOTOMY N/A 08/07/2017   Procedure: STERNOTOMY;  Surgeon: Prescott Gum, Collier Salina, MD;  Location: [facility];  Service: Thoracic;  Laterality: N/A;  . VENTRICULOPERITONEAL SHUNT  10-08-2002   RIGHT FRONTAL FOR COMMUNICATING HYDROCEPHALUS    Allergies  Allergen Reactions  . Carvedilol Palpitations and Other (See Comments)    Abdominal bloating  . Atorvastatin     fatigue  . Claritin [Loratadine] Other (See Comments)    Blurred vision and dry eyes  . Lisinopril Other (See Comments)    "drunk feeling"    Immunization History  Administered Date(s) Administered  . Influenza, High Dose Seasonal PF 10/07/2016    Family History  Problem Relation Age of Onset  . Colon cancer Mother 14  . Stomach cancer Maternal Aunt   . Breast cancer Maternal Aunt   . Uterine cancer Maternal Aunt      Current Outpatient Medications:  .  acetaminophen (TYLENOL) 500 MG tablet, Take 1-2 tablets (500-1,000 mg total) by mouth 2 (two) times daily as needed for moderate pain or headache., Disp: 30 tablet, Rfl: 0 .  amLODipine (NORVASC) 5 MG tablet, Take 5 mg by mouth daily., Disp: , Rfl:  .  buPROPion (WELLBUTRIN XL) 300 MG 24 hr tablet, Take 300 mg by mouth daily., Disp: , Rfl:  .  esomeprazole (NEXIUM) 40 MG capsule, Take 40 mg by mouth  daily. , Disp: , Rfl:  .  hydrochlorothiazide (HYDRODIURIL) 25 MG tablet, Take 25 mg by mouth daily., Disp: , Rfl:  .  ibuprofen (ADVIL,MOTRIN) 200 MG tablet, Take 400-600 mg by mouth 2 (two) times daily as needed for headache or moderate pain., Disp: , Rfl:  .  metoprolol succinate (TOPROL-XL) 100 MG 24 hr tablet, Take 100 mg by mouth daily. Take with or immediately following a meal., Disp: , Rfl:  .  Polyethylene Glycol 400 (BLINK TEARS OP), Place 1 drop into both eyes daily as needed (dry eyes)., Disp: , Rfl:  .  Polyvinyl Alcohol (LIQUID TEARS OP), Place 1 drop into both eyes daily as needed (dry eyes)., Disp: , Rfl:  .  pravastatin (PRAVACHOL) 80 MG tablet, Take 1 tablet (80 mg total) by mouth daily., Disp: ,  Rfl:  .  traMADol (ULTRAM) 50 MG tablet, Take 50 mg by mouth every 4-6 hours PRN severe pain, Disp: 28 tablet, Rfl: 0   Review of Systems     Objective:   Physical Exam Vitals:   08/23/17 1323  BP: 136/88  Pulse: 87  SpO2: 95%  Weight: 219 lb (99.3 kg)  Height: 5\' 4"  (1.626 m)    Discussion only    Assessment:       ICD-10-CM   1. ILD (interstitial lung disease) ([place]) J84.9   2. Mediastinal mass J98.59   3. 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        Plan:     ILD (interstitial lung disease) ([place])  - this is early and very mild  - likely a disease called NSIP based on preliminar report - this might or might not progress but unliekly to progress - I recommend we wait for final report and also will discuss at our upcoming case conference next few months - Till then we just monitor the situation - Do Spiro and DLCO in 6 months  - Do HRCT in 12 months   Mediastinal mass Unicentric Castleman disease ([place])  - basedon PET scan this is just limited to 1 mass and you had curative resection treatment  - we need to monitor this - dr Nils Pyle will also give opinion on this - do CT chest with contrast in 12 months  - if any change to plan on tumor or case  conference we wil llet you know   FOllowup 6 monthhs in regular clinic but after spirometry  > 50% of this > 25 min visit spent in face to face counseling or coordination of care - by this undersigned MD - Dr Brand Males. This includes one or more of the following documented above: discussion of test results, diagnostic or treatment recommendations, prognosis, risks and benefits of management options, instructions, education, compliance or risk-factor reduction   Dr. Brand Males, M.D., [facility].C.P Pulmonary and Critical Care Medicine Staff Physician, [place] Director - Interstitial Lung Disease  Program  Pulmonary [place] at [place], [place], [address]  Pager: [phone], If no answer or between  15:00h - 7:00h: call [phone] Telephone: [phone]

## 2017-09-11 ENCOUNTER — Other Ambulatory Visit: Payer: Self-pay | Admitting: Cardiothoracic Surgery

## 2017-09-11 DIAGNOSIS — J9859 Other diseases of mediastinum, not elsewhere classified: Secondary | ICD-10-CM

## 2017-09-11 NOTE — Progress Notes (Unsigned)
cxr 

## 2017-09-12 ENCOUNTER — Other Ambulatory Visit: Payer: Self-pay

## 2017-09-12 ENCOUNTER — Ambulatory Visit
Admission: RE | Admit: 2017-09-12 | Discharge: 2017-09-12 | Disposition: A | Discharge status: Home / Self Care | Payer: Medicare HMO | Source: Ambulatory Visit | Attending: Cardiothoracic Surgery | Admitting: Cardiothoracic Surgery

## 2017-09-12 ENCOUNTER — Encounter: Payer: Self-pay | Admitting: Cardiothoracic Surgery

## 2017-09-12 ENCOUNTER — Ambulatory Visit (INDEPENDENT_AMBULATORY_CARE_PROVIDER_SITE_OTHER): Payer: Self-pay | Admitting: Cardiothoracic Surgery

## 2017-09-12 VITALS — BP 145/75 | HR 72 | Resp 16 | Ht 64.0 in | Wt 219.0 lb

## 2017-09-12 DIAGNOSIS — R918 Other nonspecific abnormal finding of lung field: Secondary | ICD-10-CM | POA: Diagnosis not present

## 2017-09-12 DIAGNOSIS — D47Z2 Castleman disease: Secondary | ICD-10-CM

## 2017-09-12 DIAGNOSIS — Z09 Encounter for follow-up examination after completed treatment for conditions other than malignant neoplasm: Secondary | ICD-10-CM

## 2017-09-12 DIAGNOSIS — J9859 Other diseases of mediastinum, not elsewhere classified: Secondary | ICD-10-CM

## 2017-09-14 ENCOUNTER — Encounter: Payer: Self-pay | Admitting: Cardiothoracic Surgery

## 2017-09-14 NOTE — Progress Notes (Signed)
PCP is Harlan Stains, MD Referring Provider is Brand Males, MD  Chief Complaint  Patient presents with  . Routine Post Op    f/u from surgery with CXR s/p Sternotomy, RESECTION OF MEDIASTINAL MASS, LUNG BXS of  RUL/RML  08/07/2017    HPI: Patient returns for postop visit after sternotomy and resection of a large mediastinal mass of lymphatic origin-pathology demonstrates Castleman's disease.  Patient also had right lung biopsies showing potentially very mild pulmonary fibrosis.  Patient is being followed by Dr. Chase Caller.  Patient states she has a CT scan of the chest scheduled in approximately 6 to 12 months.  The patient denies significant incisional pain and takes only Tylenol. There are no clicks or pops of the sternal incision, no drainage. Patient is anxious to increase her activity level.  Past Medical History:  Diagnosis Date  . Carpal tunnel syndrome of right wrist   . Diverticulosis   . GERD (gastroesophageal reflux disease)   . H/O hiatal hernia   . H/O intraventricular hemorrhage 2004--  SPONTANEOUS   S/P SHUNT  . Heart murmur   . Hip pain, right OCCASIONAL -- PINCHED NERVE  . History of gastric ulcer 2001  . Hyperlipidemia   . Hypertension   . Hypoglycemia   . MVP (mitral valve prolapse)   . Normal echocardiogram 30 YRS AGO  . S/P ventriculoperitoneal shunt 2004--- PER PT SHUNT IS WORKING WELL --  NO ISSUES  . Squamous cell cancer of scalp and skin of neck    mole back of head    Past Surgical History:  Procedure Laterality Date  . BILATERAL FRONTAL INTERVENTRICULAR CATHETER VIA TWIST DRILL HOLE  AUG 2004   SPONTANEOUS SUBARACHNOID  HEMORRHAGE AND  INTRAVENTRICULAR HEMORRHAGE  . CARPAL TUNNEL RELEASE  07/26/2011   Procedure: CARPAL TUNNEL RELEASE;  Surgeon: Magnus Sinning, MD;  Location: [facility];  Service: Orthopedics;  Laterality: Right;  . CATARACT EXTRACTION W/ INTRAOCULAR LENS  IMPLANT, BILATERAL  2012   BILATERAL  . DUPUYTREN  CONTRACTURE RELEASE  07/26/2011   Procedure: DUPUYTREN CONTRACTURE RELEASE;  Surgeon: Magnus Sinning, MD;  Location: [facility];  Service: Orthopedics;  Laterality: Right;  EXCISION OF DUPYTRENS BAND THUMB- INDEX WEB SPACE  . LUNG BIOPSY Right 08/07/2017   Procedure: Open LUNG BIOPSY;  Surgeon: Ivin Poot, MD;  Location: [facility];  Service: Thoracic;  Laterality: Right;  . RESECTION OF MEDIASTINAL MASS N/A 08/07/2017   Procedure: RESECTION OF MEDIASTINAL MASS;  Surgeon: Ivin Poot, MD;  Location: [facility];  Service: Thoracic;  Laterality: N/A;  . RETINAL DETACHMENT REPAIR W/ SCLERAL BUCKLE LE  03-10-2011   LEFT EYE  . STERNOTOMY N/A 08/07/2017   Procedure: STERNOTOMY;  Surgeon: Prescott Gum, Collier Salina, MD;  Location: [facility];  Service: Thoracic;  Laterality: N/A;  . VENTRICULOPERITONEAL SHUNT  10-08-2002   RIGHT FRONTAL FOR COMMUNICATING HYDROCEPHALUS    Family History  Problem Relation Age of Onset  . Colon cancer Mother 66  . Stomach cancer Maternal Aunt   . Breast cancer Maternal Aunt   . Uterine cancer Maternal Aunt     Social History Social History   Tobacco Use  . Smoking status: Never Smoker  . Smokeless tobacco: Never Used  Substance Use Topics  . Alcohol use: Yes    Alcohol/week: 2.0 standard drinks    Types: 2 Glasses of wine per week    Comment: OCCASIONAL  . Drug use: No    Current Outpatient Medications  Medication Sig Dispense Refill  . acetaminophen (TYLENOL) 500 MG tablet Take 1-2 tablets (500-1,000 mg total) by mouth 2 (two) times daily as needed for moderate pain or headache. 30 tablet 0  . amLODipine (NORVASC) 5 MG tablet Take 5 mg by mouth daily.    Marland Kitchen buPROPion (WELLBUTRIN XL) 300 MG 24 hr tablet Take 300 mg by mouth daily.    Marland Kitchen esomeprazole (NEXIUM) 40 MG capsule Take 40 mg by mouth daily.     . hydrochlorothiazide (HYDRODIURIL) 25 MG tablet Take 25 mg by mouth daily.    Marland Kitchen ibuprofen (ADVIL,MOTRIN) 200 MG tablet Take 400-600 mg by mouth 2 (two)  times daily as needed for headache or moderate pain.    . metoprolol succinate (TOPROL-XL) 100 MG 24 hr tablet Take 100 mg by mouth daily. Take with or immediately following a meal.    . Polyethylene Glycol 400 (BLINK TEARS OP) Place 1 drop into both eyes daily as needed (dry eyes).    . Polyvinyl Alcohol (LIQUID TEARS OP) Place 1 drop into both eyes daily as needed (dry eyes).    . pravastatin (PRAVACHOL) 80 MG tablet Take 1 tablet (80 mg total) by mouth daily.     No current facility-administered medications for this visit.     Allergies  Allergen Reactions  . Carvedilol Palpitations and Other (See Comments)    Abdominal bloating  . Atorvastatin     fatigue  . Claritin [Loratadine] Other (See Comments)    Blurred vision and dry eyes  . Lisinopril Other (See Comments)    "drunk feeling"    Review of Systems  No fever Feels stronger No shortness of breath, no cough  BP (!) 145/75 (BP Location: Right Arm, Patient Position: Sitting, Cuff Size: Large)   Pulse 72   Resp 16   Ht 5\' 4"  (1.626 m)   Wt 219 lb (99.3 kg)   SpO2 96% Comment: RA  BMI 37.59 kg/m  Physical Exam      Exam Sternal incision well-healed   General- alert and comfortable    Neck- no JVD, no cervical adenopathy palpable, no carotid bruit   Lungs- clear without rales, wheezes   Cor- regular rate and rhythm, no murmur , gallop   Abdomen- soft, non-tender   Extremities - warm, non-tender, minimal edema   Neuro- oriented, appropriate, no focal weakness   Diagnostic Tests: Chest x-ray clear  Impression: Doing well after sternotomy and resection of large lymphatic mass which pathology indicates as Castleman's disease.  This is not a surgical disease and patient will be actually followed by her pulmonologist, Dr. Chase Caller.  Patient understands she can now drive do normal daily activities but should not lift more than 20 pounds until 3 months after the day of surgery.  She will return here as  needed.  Plan: Continue current medications, medical follow-up, and return for surgical follow-up as needed.   Len Childs, MD [facility] [phone]

## 2017-09-18 ENCOUNTER — Telehealth: Payer: Self-pay | Admitting: Internal Medicine

## 2017-09-18 ENCOUNTER — Ambulatory Visit: Payer: Self-pay | Admitting: Internal Medicine

## 2017-09-18 NOTE — Telephone Encounter (Signed)
I am fine with that 9/19/1/9. FYI - Iw ill wrap up iLD clinic by 3pm tghat day (email pending). You can get her in later afer that too

## 2017-09-18 NOTE — Telephone Encounter (Signed)
  Alison Ayala  - please arrange for follouwop. ILD conference is suspecting early nsip v chronic HP on biopsy. ILD clinic  Thanks    Dr. Brand Males, M.D., [facility].C.P Pulmonary and Critical Care Medicine Staff Physician, [place] Director - Interstitial Lung Disease  Program  Pulmonary [place] at [place], [place], [address]  Pager: [phone], If no answer or between  15:00h - 7:00h: call [phone] Telephone: [phone]

## 2017-09-18 NOTE — Telephone Encounter (Signed)
Called and spoke with pt stating to her that MR wanted her to come in for an appt to discuss a few things with her that was discussed at the ILD conference.  Pt expressed understanding. appt was made for pt 11/08/17.  Nothing further needed.

## 2017-09-18 NOTE — Progress Notes (Signed)
   Interstitial Lung Disease Multidisciplinary Conference   Alison Ayala    MRN [id number]    DOB 1946-01-23  Primary Care Physician:White, Caren Griffins, MD  Referring Physician: Dr Chase Caller  Date of conference: 09/18/2017  Participating Pulmonary: Dr. Brand Males, MD ;  Dr Marshell Garfinkel, MD Pathology: Dr Jaquita Folds, MD Radiology: Dr Salvatore Marvel MD, Others: pulm clinicians  Brief History: dyspnea, ILD, Mediastinal mass that showed Castleman disease  Serology: neg  MDD discussion of CT scan: "truly indeterminate" per ATS 2018 criteria. Air trapping +, Mild GGO > very mild reticulation . No traction bronchiectasis.  No honeycombing. No zonal predmoninance   Pathology discussion of CT scan  NOT UIP. Has preseved architectiure. Mixed inflammation and fibrosis,. No temporal heterogeneity. Few giant cells - forming poorly formed granuloma  PFTs: x  Labs: x*  MDD Impression/Recs: NOT IPF. Early NSIP v Chronic HP early.    Dr. Brand Males, M.D., [facility].C.P Pulmonary and Critical Care Medicine Staff Physician, [place] Director - Interstitial Lung Disease  Program  Pulmonary [place] at [place], [place], [address]  Pager: [phone], If no answer or between  15:00h - 7:00h: call [phone] Telephone: [phone] 09/18/2017 1:43 PM    References: Diagnosis of Idiopathic Pulmonary Fibrosis. An Official ATS/ERS/JRS/ALAT Clinical Practice Guideline. Raghu G et al, [place]. 2018 Sep 1;198(5):e44-e68.   IPF Suspected   Histopath ology Pattern      UIP  Probable UIP  Indeterminate for  UIP  Alternative  diagnosis    UIP  IPF  IPF  IPF  Non-IPF dx   HRCT   Probabe UIP  IPF  IPF  IPF (Likely)**  Non-IPF dx  Pattern  Indeterminate for UIP  IPF  IPF (Likely)**  Indeterminate  for IPF**  Non-IPF dx    Alternative diagnosis  IPF (Likely)**/ non-IPF dx    Non-IPF dx  Non-IPF dx  Non-IPF dx     Idiopathic pulmonary fibrosis diagnosis based upon HRCT and Biopsy paterns.  ** IPF is the likely diagnosis when any of following features are present:  . Moderate-to-severe traction bronchiectasis/bronchiolectasis (defined as mild traction bronchiectasis/bronchiolectasis in four or more lobes including the lingual as a lobe, or moderate to severe traction bronchiectasis in two or more lobes) in a man over age 67 years or in a woman over age 22 years . Extensive (>30%) reticulation on HRCT and an age >70 years  . Increased neutrophils and/or absence of lymphocytosis in BAL fluid  . Multidisciplinary discussion reaches a confident diagnosis of IPF.   **Indeterminate for IPF  . Without an adequate biopsy is unlikely to be IPF  . With an adequate biopsy may be reclassified to a more specific diagnosis after multidisciplinary discussion and/or additional consultation.   dx = diagnosis; HRCT = high-resolution computed tomography; IPF = idiopathic pulmonary fibrosis; UIP = usual interstitial pneumonia.

## 2017-09-18 NOTE — Telephone Encounter (Signed)
First avail ILD clinic appt is 10/25/17. MR please advise if this is okay. Thanks!

## 2017-11-05 ENCOUNTER — Encounter: Payer: Self-pay | Admitting: Internal Medicine

## 2017-11-06 DIAGNOSIS — F325 Major depressive disorder, single episode, in full remission: Secondary | ICD-10-CM | POA: Diagnosis not present

## 2017-11-06 DIAGNOSIS — Z79899 Other long term (current) drug therapy: Secondary | ICD-10-CM | POA: Diagnosis not present

## 2017-11-06 DIAGNOSIS — Z23 Encounter for immunization: Secondary | ICD-10-CM | POA: Diagnosis not present

## 2017-11-06 DIAGNOSIS — I10 Essential (primary) hypertension: Secondary | ICD-10-CM | POA: Diagnosis not present

## 2017-11-06 DIAGNOSIS — K219 Gastro-esophageal reflux disease without esophagitis: Secondary | ICD-10-CM | POA: Diagnosis not present

## 2017-11-06 DIAGNOSIS — M5442 Lumbago with sciatica, left side: Secondary | ICD-10-CM | POA: Diagnosis not present

## 2017-11-06 DIAGNOSIS — Z0000 Encounter for general adult medical examination without abnormal findings: Secondary | ICD-10-CM | POA: Diagnosis not present

## 2017-11-06 DIAGNOSIS — R7303 Prediabetes: Secondary | ICD-10-CM | POA: Diagnosis not present

## 2017-11-06 DIAGNOSIS — E559 Vitamin D deficiency, unspecified: Secondary | ICD-10-CM | POA: Diagnosis not present

## 2017-11-06 DIAGNOSIS — J849 Interstitial pulmonary disease, unspecified: Secondary | ICD-10-CM | POA: Diagnosis not present

## 2017-11-06 DIAGNOSIS — E785 Hyperlipidemia, unspecified: Secondary | ICD-10-CM | POA: Diagnosis not present

## 2017-11-08 ENCOUNTER — Ambulatory Visit: Payer: Medicare HMO | Admitting: Internal Medicine

## 2017-11-15 ENCOUNTER — Ambulatory Visit: Payer: Medicare HMO | Admitting: Internal Medicine

## 2017-11-15 NOTE — Progress Notes (Deleted)
IOV 07/03/2017  Chief Complaint  Patient presents with  . Pulm Consult    Referred by Dr. Harlan Stains for possible ILD. Per patient, she does have some occasional SOB and chest tightness.    Alison Ayala 72 y.o. female [place]  [place] [address] -referred for evaluation of potential interstitial lung disease.  According to the patient on routine exam it was felt that the sternal head of the clavicles was a little bit prominent so therefore a chest x-ray was done by the primary care physician which was not revealing.  Subsequently patient underwent CT scan of the chest documented below that I personally visualized and agree with the findings.  The 2 significant findings are anterior mediastinal mass with partial calcification and also possible interstitial lung disease and therefore she has been referred here.  She is denying any acute complaints . SHe has appt with Dr Nils Pyle for anterior mediastinal mass  We performed the pulmonary integrated interstitial lung disease questionnaire and for the symptoms she denied any cough but she did admit to shortness of breath that started insidiously approximately 2 years ago and for the last 6 months is gradually gotten worse.  It is not episodic.  It is present on exertion and relieved by rest.  Today when we walked her she did drop her pulse ox greater than 3 points but did not go below 88% her heart rate did respond but it did not go over 90/min.  On a level 5 scale she rates level 3 dyspnea for picking up and straightening, sweeping and vacuuming walking up a hill and walking upstairs.  She rates a level 2 dyspnea for walking on a level ground at her own pace and walking with appears, making the bed, shopping and doing laundry.  She gives a level 1 dyspnea for standing up and dressing but otherwise all other activities a level 0.  Review of systems: She has chronic fatigue and arthralgia.  She also has dysphagia for the last  few to several years [4-5 years].  She has had dry eyes for the last few decades and dry mouth for the last 1 year.  She admits to acid reflux disease for the last 40 years and on and off cold sores in her mouth.  There is no recurrent fever or weight loss or Raynaud  Past medical history: Positive for acid reflux for several decades but negative for asthma, COPD, heart failure, rheumatoid arthritis, other collagen vascular disease, sleep apnea, immune system disorder, pulmonary hypertension, thyroid disease, stroke, seizures, hepatitis, tuberculosis, kidney disease, pneumonia or blood clots, heart disease pleurisy  Family history of lung disease: Denies any pulmonary fibrosis or any other lung disease  Exposure history: Denies any smoking, drugs of abuse.  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  In particular she denies any occupation that might predispose her to hypersensitivity pneumonitis or inorganic dust related interstitial lung disease.  Pulmonary toxicity history: Extensive drug list causing pulmonary toxicity reviewed.  And negative for all of that     CT chest 06/19/17 = personally visualized the CT chest and agree with the findings of large anterior mediastinal mass and possible ILD  - IMPRESSION: 1. No focal soft tissue mass or other abnormality to account for the perceived  palpable abnormality in the suprasternal region. 2. However, there is a large anterior mediastinal mass which demonstrates heterogeneous enhancement and calcification, with several smaller adjacent lesions. These findings are concerning for potential neoplasm, with primary differential considerations including germ cell neoplasm or lesion of thymic origin. On PET scan 07/16/17 - 4.6 cm anterior mediastinal mass with dense central calcifications is  hypermetabolic. SUV max is 6.55. Findings highly suspicious for a thymic neoplasmThoracic surgical consultation is strongly recommended. 3. 1.7 x 2.0 cm left adrenal nodule is indeterminate. Further characterization with nonemergent adrenal protocol CT scan is recommended in the near future to exclude the possibility of a metastatic lesion. 4. Unusual appearance of the lung parenchyma which is concerning for potential interstitial lung disease. Outpatient referral to pulmonology for further evaluation is suggested, as well as a follow-up high-resolution chest CT in 6-12 months to assess for temporal changes in the appearance of the lung parenchyma. 5. Multiple healing left-sided rib fractures, as above. These are all nondisplaced or minimally displaced. No associated pneumothorax noted at this time.   Electronically Signed   By: Vinnie Langton M.D.   On: 06/20/2017 09:20   Results for Alison, Ayala (MRN [id number]) as of 07/03/2017 13:34  Ref. Range 07/26/2011 08:59  Hemoglobin Latest Ref Range: 12.0 - 15.0 g/dL 20.7 (H)    OV 08/23/2017  Chief Complaint  Patient presents with  . Follow-up    Pt was hospitalized 7/2-7/7 due to having mediastinal mass resection and a lung biopsy was also performed. HRCT performed 6/3, PFT performed 6/5, and PET scan performed 6/10.  Pt states overall she has been doing good but is anxious to know about results from biopsy.    Follow-up mediastinal massAnteriorand PET hot Follow-up interstitial lung disease  She has now been discharged from post biopsy. She is doing well. She feels baseline. Shortness of breath with exertion is the same and it is mild. Walking desaturation test is documented below is unchanged. A biopsy date was 08/07/2017. The anterior mediastinal mass shows Castleman's disease. Based on PET scan I suspect this is unicentric. She has follow-up with thoracic service is pending. She also had open lung biopsy for early  interstitial lung disease. The formal report is still pending but I discussed with pathologist Dr. Vic Ripper who feels that this is very early and mild interstitial lung disease without any specific pattern but suspects this is early NSIP. There is no granuloma.   OV 11/15/2017  Subjective:  Patient ID: Doyce Para, female , DOB: 03/31/1945 , age 35 y.o. , MRN: [id number] , ADDRESS: [place]. [place] [address]   11/15/2017 -  No chief complaint on file.    HPI GLENNICE MARCOS 72 y.o. -      Simple office walk 185 feet x  3 laps goal with forehead probe 07/03/2017  08/23/2017   O2 used Room air Room air  Number laps completed 3 3  Comments about pace  normal  Resting Pulse Ox/HR 96% and 64/min 97% and HR 87/min  Final Pulse Ox/HR 92% and 88/min 92% and HR 100/min  Desaturated </= 88% no0 no  Desaturated <= 3% points yes yes  Got Tachycardic >/= 90/min no yes  Symptoms at end of test Dyspnea reproduced Mild fatigue only  Miscellaneous comments Normal pace slow stable    ROS - per HPI     has a past medical history of Carpal tunnel syndrome of right wrist, Diverticulosis, GERD (gastroesophageal reflux disease), H/O hiatal  hernia, H/O intraventricular hemorrhage (2004--  SPONTANEOUS), Heart murmur, Hip pain, right (OCCASIONAL -- PINCHED NERVE), History of gastric ulcer (2001), Hyperlipidemia, Hypertension, Hypoglycemia, MVP (mitral valve prolapse), Normal echocardiogram (30 YRS AGO), S/P ventriculoperitoneal shunt (2004--- PER PT SHUNT IS WORKING WELL --  NO ISSUES), and Squamous cell cancer of scalp and skin of neck.   reports that she has never smoked. She has never used smokeless tobacco.  Past Surgical History:  Procedure Laterality Date  . BILATERAL FRONTAL INTERVENTRICULAR CATHETER VIA TWIST DRILL HOLE  AUG 2004   SPONTANEOUS SUBARACHNOID  HEMORRHAGE AND  INTRAVENTRICULAR HEMORRHAGE  . CARPAL TUNNEL RELEASE  07/26/2011   Procedure: CARPAL TUNNEL  RELEASE;  Surgeon: Magnus Sinning, MD;  Location: [facility];  Service: Orthopedics;  Laterality: Right;  . CATARACT EXTRACTION W/ INTRAOCULAR LENS  IMPLANT, BILATERAL  2012   BILATERAL  . DUPUYTREN CONTRACTURE RELEASE  07/26/2011   Procedure: DUPUYTREN CONTRACTURE RELEASE;  Surgeon: Magnus Sinning, MD;  Location: [facility];  Service: Orthopedics;  Laterality: Right;  EXCISION OF DUPYTRENS BAND THUMB- INDEX WEB SPACE  . LUNG BIOPSY Right 08/07/2017   Procedure: Open LUNG BIOPSY;  Surgeon: Ivin Poot, MD;  Location: [facility];  Service: Thoracic;  Laterality: Right;  . RESECTION OF MEDIASTINAL MASS N/A 08/07/2017   Procedure: RESECTION OF MEDIASTINAL MASS;  Surgeon: Ivin Poot, MD;  Location: [facility];  Service: Thoracic;  Laterality: N/A;  . RETINAL DETACHMENT REPAIR W/ SCLERAL BUCKLE LE  03-10-2011   LEFT EYE  . STERNOTOMY N/A 08/07/2017   Procedure: STERNOTOMY;  Surgeon: Prescott Gum, Collier Salina, MD;  Location: [facility];  Service: Thoracic;  Laterality: N/A;  . VENTRICULOPERITONEAL SHUNT  10-08-2002   RIGHT FRONTAL FOR COMMUNICATING HYDROCEPHALUS    Allergies  Allergen Reactions  . Carvedilol Palpitations and Other (See Comments)    Abdominal bloating  . Atorvastatin     fatigue  . Claritin [Loratadine] Other (See Comments)    Blurred vision and dry eyes  . Lisinopril Other (See Comments)    "drunk feeling"    Immunization History  Administered Date(s) Administered  . Influenza, High Dose Seasonal PF 10/07/2016    Family History  Problem Relation Age of Onset  . Colon cancer Mother 25  . Stomach cancer Maternal Aunt   . Breast cancer Maternal Aunt   . Uterine cancer Maternal Aunt      Current Outpatient Medications:  .  acetaminophen (TYLENOL) 500 MG tablet, Take 1-2 tablets (500-1,000 mg total) by mouth 2 (two) times daily as needed for moderate pain or headache., Disp: 30 tablet, Rfl: 0 .  amLODipine (NORVASC) 5 MG tablet, Take 5 mg by mouth  daily., Disp: , Rfl:  .  buPROPion (WELLBUTRIN XL) 300 MG 24 hr tablet, Take 300 mg by mouth daily., Disp: , Rfl:  .  esomeprazole (NEXIUM) 40 MG capsule, Take 40 mg by mouth daily. , Disp: , Rfl:  .  hydrochlorothiazide (HYDRODIURIL) 25 MG tablet, Take 25 mg by mouth daily., Disp: , Rfl:  .  ibuprofen (ADVIL,MOTRIN) 200 MG tablet, Take 400-600 mg by mouth 2 (two) times daily as needed for headache or moderate pain., Disp: , Rfl:  .  metoprolol succinate (TOPROL-XL) 100 MG 24 hr tablet, Take 100 mg by mouth daily. Take with or immediately following a meal., Disp: , Rfl:  .  Polyethylene Glycol 400 (BLINK TEARS OP), Place 1 drop into both eyes daily as needed (dry eyes)., Disp: , Rfl:  .  Polyvinyl Alcohol (LIQUID TEARS OP), Place 1 drop into both eyes daily as needed (dry eyes)., Disp: , Rfl:  .  pravastatin (PRAVACHOL) 80 MG tablet, Take 1 tablet (80 mg total) by mouth daily., Disp: , Rfl:       Objective:   There were no vitals filed for this visit.  Estimated body mass index is 37.59 kg/m as calculated from the following:   Height as of 09/12/17: 5\' 4"  (1.626 m).   Weight as of 09/12/17: 219 lb (99.3 kg).  @WEIGHTCHANGE @  There were no vitals filed for this visit.   Physical Exam  General Appearance:    Alert, cooperative, no distress, appears stated age - *** , Deconditioned looking - *** , OBESE  - ***, Sitting on Wheelchair -  ***  Head:    Normocephalic, without obvious abnormality, atraumatic  Eyes:    PERRL, conjunctiva/corneas clear,  Ears:    Normal TM's and external ear canals, both ears  Nose:   Nares normal, septum midline, mucosa normal, no drainage    or sinus tenderness. OXYGEN ON  - *** . Patient is @ ***   Throat:   Lips, mucosa, and tongue normal; teeth and gums normal. Cyanosis on lips - ***  Neck:   Supple, symmetrical, trachea midline, no adenopathy;    thyroid:  no enlargement/tenderness/nodules; no carotid   bruit or JVD  Back:     Symmetric, no curvature,  ROM normal, no CVA tenderness  Lungs:     Distress - *** , Wheeze ***, Barrell Chest - ***, Purse lip breathing - ***, Crackles - ***   Chest Wall:    No tenderness or deformity.    Heart:    Regular rate and rhythm, S1 and S2 normal, no rub   or gallop, Murmur - ***  Breast Exam:    NOT DONE  Abdomen:     Soft, non-tender, bowel sounds active all four quadrants,    no masses, no organomegaly. Visceral obesity - ***  Genitalia:   NOT DONE  Rectal:   NOT DONE  Extremities:   Extremities - normal, Has Cane - ***, Clubbing - ***, Edema - ***  Pulses:   2+ and symmetric all extremities  Skin:   Stigmata of Connective Tissue Disease - ***  Lymph nodes:   Cervical, supraclavicular, and axillary nodes normal  Psychiatric:  Neurologic:   Pleasant - ***, Anxious - ***, Flat affect - ***  CAm-ICU - neg, Alert and Oriented x 3 - yes, Moves all 4s - yes, Speech - normal, Cognition - intact           Assessment:     No diagnosis found.     Plan:     There are no Patient Instructions on file for this visit.    SIGNATURE    Dr. Brand Males, M.D., F.C.C.P,  Pulmonary and Critical Care Medicine Staff Physician, [facility] Director - Interstitial Lung Disease  Program  Pulmonary [place] at [place], [place], [address]  Pager: [phone], If no answer or between  15:00h - 7:00h: call [phone] Telephone: [phone]  9:03 AM 11/15/2017

## 2017-11-21 ENCOUNTER — Ambulatory Visit: Payer: Medicare HMO | Admitting: Internal Medicine

## 2017-11-26 ENCOUNTER — Encounter: Payer: Self-pay | Admitting: Internal Medicine

## 2017-11-26 ENCOUNTER — Ambulatory Visit: Payer: Medicare HMO | Admitting: Internal Medicine

## 2017-11-26 VITALS — BP 142/78 | HR 75 | Ht 64.0 in | Wt 224.2 lb

## 2017-11-26 DIAGNOSIS — J849 Interstitial pulmonary disease, unspecified: Secondary | ICD-10-CM | POA: Diagnosis not present

## 2017-11-26 DIAGNOSIS — J8489 Other specified interstitial pulmonary diseases: Secondary | ICD-10-CM

## 2017-11-26 NOTE — Patient Instructions (Addendum)
ICD-10-CM   1. ILD (interstitial lung disease) ([facility]) J84.9 Pulmonary function test  2. NSIP (nonspecific interstitial pneumonia) ([facility]) J84.89      I think you have NSIP variety of interstitial lung disease At this point in time you are doing well and stable and possibly better Extensive repeat history for mold exposure or any organic antigen causing hypersensitivity pneumonitis is negative  Plan -Therefore hold off on prednisone therapy -If you ever find any evidence of mold anywhere in your house please let us know -We will follow closely with lung function testing and flexible approach to CT scan  Follow-up -In 3 or 4 months get spirometry with DLCO and return to interstitial lung disease clinic -High-resolution CT chest summer 2020

## 2017-11-26 NOTE — Progress Notes (Signed)
IOV 07/03/2017  Chief Complaint  Patient presents with  . Pulm Consult    Referred by Dr. Harlan Ayala for possible ILD. Per patient, she does have some occasional SOB and chest tightness.    Alison Ayala 72 y.o. female Alison Ayala  Alison Ayala Alison Ayala -referred for evaluation of potential interstitial lung disease.  According to the patient on routine exam it was felt that the sternal head of the clavicles was a little bit prominent so therefore a chest x-ray was done by the primary care physician which was not revealing.  Subsequently patient underwent CT scan of the chest documented below that I personally visualized and agree with the findings.  The 2 significant findings are anterior mediastinal mass with partial calcification and also possible interstitial lung disease and therefore she has been referred here.  She is denying any acute complaints . SHe has appt with Dr Alison Ayala for anterior mediastinal mass  We performed the pulmonary integrated interstitial lung disease questionnaire and for the symptoms she denied any cough but she did admit to shortness of breath that started insidiously approximately 2 years ago and for the last 6 months is gradually gotten worse.  It is not episodic.  It is present on exertion and relieved by rest.  Today when we walked her she did drop her pulse ox greater than 3 points but did not go below 88% her heart rate did respond but it did not go over 90/min.  On a level 5 scale she rates level 3 dyspnea for picking up and straightening, sweeping and vacuuming walking up a hill and walking upstairs.  She rates a level 2 dyspnea for walking on a level ground at her own pace and walking with appears, making the bed, shopping and doing laundry.  She gives a level 1 dyspnea for standing up and dressing but otherwise all other activities a level 0.  Review of systems: She has chronic fatigue and arthralgia.  She also has dysphagia for the last  few to several years [4-5 years].  She has had dry eyes for the last few decades and dry mouth for the last 1 year.  She admits to acid reflux disease for the last 40 years and on and off cold sores in her mouth.  There is no recurrent fever or weight loss or Raynaud  Past medical history: Positive for acid reflux for several decades but negative for asthma, COPD, heart failure, rheumatoid arthritis, other collagen vascular disease, sleep apnea, immune system disorder, pulmonary hypertension, thyroid disease, stroke, seizures, hepatitis, tuberculosis, kidney disease, pneumonia or blood clots, heart disease pleurisy  Family history of lung disease: Denies any pulmonary fibrosis or any other lung disease  Exposure history: Denies any smoking, drugs of abuse.  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  In particular she denies any occupation that might predispose her to hypersensitivity pneumonitis or inorganic dust related interstitial lung disease.  Pulmonary toxicity history: Extensive drug list causing pulmonary toxicity reviewed.  And negative for all of that     CT chest 06/19/17 = personally visualized the CT chest and agree with the findings of large anterior mediastinal mass and possible ILD  - IMPRESSION: 1. No focal soft tissue mass or other abnormality to account for the perceived  palpable abnormality in the suprasternal region. 2. However, there is a large anterior mediastinal mass which demonstrates heterogeneous enhancement and calcification, with several smaller adjacent lesions. These findings are concerning for potential neoplasm, with primary differential considerations including germ cell neoplasm or lesion of thymic origin. On PET scan 07/16/17 - 4.6 cm anterior mediastinal mass with dense central calcifications is  hypermetabolic. SUV max is 6.55. Findings highly suspicious for a thymic neoplasmThoracic surgical consultation is strongly recommended. 3. 1.7 x 2.0 cm left adrenal nodule is indeterminate. Further characterization with nonemergent adrenal protocol CT scan is recommended in the near future to exclude the possibility of a metastatic lesion. 4. Unusual appearance of the lung parenchyma which is concerning for potential interstitial lung disease. Outpatient referral to pulmonology for further evaluation is suggested, as well as a follow-up high-resolution chest CT in 6-12 months to assess for temporal changes in the appearance of the lung parenchyma. 5. Multiple healing left-sided rib fractures, as above. These are all nondisplaced or minimally displaced. No associated pneumothorax noted at this time.   Electronically Signed   By: Alison Ayala M.D.   On: 06/20/2017 09:20   Results for Alison Ayala (MRN [id number]) as of 07/03/2017 13:34  Ref. Range 07/26/2011 08:59  Hemoglobin Latest Ref Range: 12.0 - 15.0 g/dL 20.7 (H)    OV 08/23/2017  Chief Complaint  Patient presents with  . Follow-up    Pt was hospitalized 7/2-7/7 due to having mediastinal mass resection and a lung biopsy was also performed. HRCT performed 6/3, PFT performed 6/5, and PET scan performed 6/10.  Pt states overall she has been doing good but is anxious to know about results from biopsy.    Follow-up mediastinal massAnteriorand PET hot Follow-up interstitial lung disease  She has now been discharged from post biopsy. She is doing well. She feels baseline. Shortness of breath with exertion is the same and it is mild. Walking desaturation test is documented below is unchanged. A biopsy date was 08/07/2017. The anterior mediastinal mass shows Castleman's disease. Based on PET scan I suspect this is unicentric. She has follow-up with thoracic service is pending. She also had open lung biopsy for early  interstitial lung disease. The formal report is still pending but I discussed with pathologist Dr. Vic Ayala who feels that this is very early and mild interstitial lung disease without any specific pattern but suspects this is early NSIP. There is no granuloma.     OV 11/26/2017  Subjective:  Patient ID: Alison Ayala, female , DOB: 11-02-1945 , age 28 y.o. , MRN: [id number] , ADDRESS: [address] [place] [address]   11/26/2017 -   Chief Complaint  Patient presents with  . Follow-up    Doing well feeling the same as last visit.    Follow-up mediastinal massAnteriorand PET hot = Castleman's disease, you are eccentric based on PET scan, July 2019 Follow-up interstitial lung disease -likely NSIP, July 2019   HPI Alison Ayala 72 y.o. -at this point in time she returns for follow-up for interstitial lung disease.  Since her last visit in July 2019 we had a multidisciplinary case conference in August 2019.  Despite the air trapping seen on CT scan of the chest her lung biopsy has been read out as an SIP.  There was a tendency for some multinucleated giant cells but no clear-cut granuloma.  These are few and far between.  I did a history retake for hypersensitivity pneumonitis.  She vehemently denies any  mold or mildew exposure.  She does not use any wind instruments.  She does not do any farming or guarding.  No exposure to rotten wood on mulch.  No pet hamsters or gerbils or birds.  No using feather pillows.  No misty fountain in the house.  No nebulizer use no CPAP use.  No humidifier use.  The house is only 72 years old.  Even in her prior jobs there is no mold exposure.  She is currently exercising.  Her walking desaturation test seems to have improved.     Simple office walk 185 feet x  3 laps goal with forehead probe 07/03/2017  08/23/2017  11/26/2017   O2 used Room air Room air Room air  Number laps completed 3 3 3   Comments about pace  normal   Resting Pulse  Ox/HR 96% and 64/min 97% and HR 87/min  96% and 73/min  Final Pulse Ox/HR 92% and 88/min 92% and HR 100/min  96% and 91/min  Desaturated </= 88% no0 no  no  Desaturated <= 3% points yes yes  no  Got Tachycardic >/= 90/min no yes  yes  Symptoms at end of test Dyspnea reproduced Mild fatigue only   Miscellaneous comments Normal pace slow stable  improved     ROS - per HPI     has a past medical history of Carpal tunnel syndrome of right wrist, Diverticulosis, GERD (gastroesophageal reflux disease), H/O hiatal hernia, H/O intraventricular hemorrhage (2004--  SPONTANEOUS), Heart murmur, Hip pain, right (OCCASIONAL -- PINCHED NERVE), History of gastric ulcer (2001), Hyperlipidemia, Hypertension, Hypoglycemia, MVP (mitral valve prolapse), Normal echocardiogram (30 YRS AGO), S/P ventriculoperitoneal shunt (2004--- PER PT SHUNT IS WORKING WELL --  NO ISSUES), and Squamous cell cancer of scalp and skin of neck.   reports that she has never smoked. She has never used smokeless tobacco.  Past Surgical History:  Procedure Laterality Date  . BILATERAL FRONTAL INTERVENTRICULAR CATHETER VIA TWIST DRILL HOLE  AUG 2004   SPONTANEOUS SUBARACHNOID  HEMORRHAGE AND  INTRAVENTRICULAR HEMORRHAGE  . CARPAL TUNNEL RELEASE  07/26/2011   Procedure: CARPAL TUNNEL RELEASE;  Surgeon: Magnus Sinning, MD;  Location: [facility];  Service: Orthopedics;  Laterality: Right;  . CATARACT EXTRACTION W/ INTRAOCULAR LENS  IMPLANT, BILATERAL  2012   BILATERAL  . DUPUYTREN CONTRACTURE RELEASE  07/26/2011   Procedure: DUPUYTREN CONTRACTURE RELEASE;  Surgeon: Magnus Sinning, MD;  Location: [facility];  Service: Orthopedics;  Laterality: Right;  EXCISION OF DUPYTRENS BAND THUMB- INDEX WEB SPACE  . LUNG BIOPSY Right 08/07/2017   Procedure: Open LUNG BIOPSY;  Surgeon: Ivin Poot, MD;  Location: [facility];  Service: Thoracic;  Laterality: Right;  . RESECTION OF MEDIASTINAL MASS N/A 08/07/2017    Procedure: RESECTION OF MEDIASTINAL MASS;  Surgeon: Ivin Poot, MD;  Location: [facility];  Service: Thoracic;  Laterality: N/A;  . RETINAL DETACHMENT REPAIR W/ SCLERAL BUCKLE LE  03-10-2011   LEFT EYE  . STERNOTOMY N/A 08/07/2017   Procedure: STERNOTOMY;  Surgeon: Prescott Gum, Collier Salina, MD;  Location: [facility];  Service: Thoracic;  Laterality: N/A;  . VENTRICULOPERITONEAL SHUNT  10-08-2002   RIGHT FRONTAL FOR COMMUNICATING HYDROCEPHALUS      Allergies  Allergen Reactions  . Carvedilol Palpitations and Other (See Comments)    Abdominal bloating  . Atorvastatin     fatigue  . Claritin [Loratadine] Other (See Comments)    Blurred vision and dry eyes  . Lisinopril Other (See Comments)    "  drunk feeling"    Immunization History  Administered Date(s) Administered  . Influenza, High Dose Seasonal PF 10/07/2016, 11/06/2017    Family History  Problem Relation Age of Onset  . Colon cancer Mother 16  . Stomach cancer Maternal Aunt   . Breast cancer Maternal Aunt   . Uterine cancer Maternal Aunt      Current Outpatient Medications:  .  acetaminophen (TYLENOL) 500 MG tablet, Take 1-2 tablets (500-1,000 mg total) by mouth 2 (two) times daily as needed for moderate pain or headache., Disp: 30 tablet, Rfl: 0 .  amLODipine (NORVASC) 5 MG tablet, Take 5 mg by mouth daily., Disp: , Rfl:  .  buPROPion (WELLBUTRIN XL) 300 MG 24 hr tablet, Take 300 mg by mouth daily., Disp: , Rfl:  .  esomeprazole (NEXIUM) 40 MG capsule, Take 40 mg by mouth daily. , Disp: , Rfl:  .  hydrochlorothiazide (HYDRODIURIL) 25 MG tablet, Take 25 mg by mouth daily., Disp: , Rfl:  .  ibuprofen (ADVIL,MOTRIN) 200 MG tablet, Take 400-600 mg by mouth 2 (two) times daily as needed for headache or moderate pain., Disp: , Rfl:  .  metoprolol succinate (TOPROL-XL) 100 MG 24 hr tablet, Take 100 mg by mouth daily. Take with or immediately following a meal., Disp: , Rfl:  .  Polyethylene Glycol 400 (BLINK TEARS OP), Place 1 drop into  both eyes daily as needed (dry eyes)., Disp: , Rfl:  .  Polyvinyl Alcohol (LIQUID TEARS OP), Place 1 drop into both eyes daily as needed (dry eyes)., Disp: , Rfl:  .  pravastatin (PRAVACHOL) 80 MG tablet, Take 1 tablet (80 mg total) by mouth daily., Disp: , Rfl:       Objective:   Vitals:   11/26/17 1026  BP: (!) 142/78  Pulse: 75  SpO2: 93%  Weight: 224 lb 3.2 oz (101.7 kg)  Height: 5\' 4"  (1.626 m)    Estimated body mass index is 38.48 kg/m as calculated from the following:   Height as of this encounter: 5\' 4"  (1.626 m).   Weight as of this encounter: 224 lb 3.2 oz (101.7 kg).  @WEIGHTCHANGE @  [employer]   11/26/17 1026  Weight: 224 lb 3.2 oz (101.7 kg)     Physical Exam  General Appearance:    Alert, cooperative, no distress, appears stated age - y3s , Deconditioned looking - no , OBESE  - yes, Sitting on Wheelchair -  no  Head:    Normocephalic, without obvious abnormality, atraumatic  Eyes:    PERRL, conjunctiva/corneas clear,  Ears:    Normal TM's and external ear canals, both ears  Nose:   Nares normal, septum midline, mucosa normal, no drainage    or sinus tenderness. OXYGEN ON  - ra . Patient is @ ra   Throat:   Lips, mucosa, and tongue normal; teeth and gums normal. Cyanosis on lips - no  Neck:   Supple, symmetrical, trachea midline, no adenopathy;    thyroid:  no enlargement/tenderness/nodules; no carotid   bruit or JVD  Back:     Symmetric, no curvature, ROM normal, no CVA tenderness  Lungs:     Distress - no , Wheeze no, Barrell Chest - no, Purse lip breathing - no, Crackles - no   Chest Wall:    No tenderness or deformity.    Heart:    Regular rate and rhythm, S1 and S2 normal, no rub   or gallop, Murmur - no  Breast Exam:  NOT DONE  Abdomen:     Soft, non-tender, bowel sounds active all four quadrants,    no masses, no organomegaly. Visceral obesity - yes  Genitalia:   NOT DONE  Rectal:   NOT DONE  Extremities:   Extremities - normal, Has Cane -  no, Clubbing - no, Edema - no  Pulses:   2+ and symmetric all extremities  Skin:   Stigmata of Connective Tissue Disease - no  Lymph nodes:   Cervical, supraclavicular, and axillary nodes normal  Psychiatric:  Neurologic:   Pleasant - yes, Anxious - no, Flat affect - no  CAm-ICU - neg, Alert and Oriented x 3 - yes, Moves all 4s - yes, Speech - normal, Cognition - intact           Assessment:       ICD-10-CM   1. ILD (interstitial lung disease) ([facility]) J84.9 Pulmonary function test  2. NSIP (nonspecific interstitial pneumonia) ([facility]) J84.89        Plan:     Patient Instructions     ICD-10-CM   1. ILD (interstitial lung disease) (HCC) J84.9 Pulmonary function test  2. NSIP (nonspecific interstitial pneumonia) ([facility]) J84.89      I think you have NSIP variety of interstitial lung disease At this point in time you are doing well and stable and possibly better Extensive repeat history for mold exposure or any organic antigen causing hypersensitivity pneumonitis is negative  Plan -Therefore hold off on prednisone therapy -If you ever find any evidence of mold anywhere in your house please let us know -We will follow closely with lung function testing and flexible approach to CT scan  Follow-up -In 3 or 4 months get spirometry with DLCO and return to interstitial lung disease clinic -High-resolution CT chest summer 2020  - HRCT  > 50% of this > 25 min visit spent in face to face counseling or coordination of care - by this undersigned MD - Dr Brand Males. This includes one or more of the following documented above: discussion of test results, diagnostic or treatment recommendations, prognosis, risks and benefits of management options, instructions, education, compliance or risk-factor reduction    SIGNATURE    Dr. Brand Males, M.D., F.C.C.P,  Pulmonary and Critical Care Medicine Staff Physician, [place] Director - Interstitial Lung Disease   Program  [facility] [place], [place], [address]  Pager: [phone], If no answer or between  15:00h - 7:00h: call [phone] Telephone: [phone]  11:00 AM 11/26/2017

## 2017-11-27 DIAGNOSIS — I10 Essential (primary) hypertension: Secondary | ICD-10-CM | POA: Diagnosis not present

## 2018-02-26 ENCOUNTER — Encounter: Payer: Self-pay | Admitting: Internal Medicine

## 2018-02-26 ENCOUNTER — Ambulatory Visit: Payer: Medicare HMO | Admitting: Internal Medicine

## 2018-02-26 ENCOUNTER — Ambulatory Visit (INDEPENDENT_AMBULATORY_CARE_PROVIDER_SITE_OTHER): Payer: Medicare HMO | Admitting: Internal Medicine

## 2018-02-26 VITALS — BP 136/62 | HR 66 | Ht 64.0 in | Wt 224.0 lb

## 2018-02-26 DIAGNOSIS — J8489 Other specified interstitial pulmonary diseases: Secondary | ICD-10-CM

## 2018-02-26 DIAGNOSIS — J849 Interstitial pulmonary disease, unspecified: Secondary | ICD-10-CM | POA: Diagnosis not present

## 2018-02-26 DIAGNOSIS — D47Z2 Castleman disease: Secondary | ICD-10-CM | POA: Diagnosis not present

## 2018-02-26 LAB — PULMONARY FUNCTION TEST
DL/VA % pred: 97 %
DL/VA: 4.7 ml/min/mmHg/L
DLCO unc % pred: 75 %
DLCO unc: 18.35 ml/min/mmHg
FEF 25-75 Pre: 2.51 L/sec
FEF2575-%Pred-Pre: 143 %
FEV1-%Pred-Pre: 99 %
FEV1-Pre: 2.16 L
FEV1FVC-%Pred-Pre: 112 %
FEV6-%Pred-Pre: 91 %
FEV6-Pre: 2.53 L
FEV6FVC-%Pred-Pre: 104 %
FVC-%Pred-Pre: 88 %
FVC-Pre: 2.55 L
Pre FEV1/FVC ratio: 85 %
Pre FEV6/FVC Ratio: 99 %

## 2018-02-26 NOTE — Patient Instructions (Signed)
ICD-10-CM   1. NSIP (nonspecific interstitial pneumonia) ([facility]) J84.89   2. ILD (interstitial lung disease) ([facility]) J84.9   3. 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

## 2018-02-26 NOTE — Progress Notes (Signed)
IOV 07/03/2017  Chief Complaint  Patient presents with  . Pulm Consult    Referred by Dr. Harlan Stains for possible ILD. Per patient, she does have some occasional SOB and chest tightness.    Alison Ayala 73 y.o. female [place]  [place] [address] -referred for evaluation of potential interstitial lung disease.  According to the patient on routine exam it was felt that the sternal head of the clavicles was a little bit prominent so therefore a chest x-ray was done by the primary care physician which was not revealing.  Subsequently patient underwent CT scan of the chest documented below that I personally visualized and agree with the findings.  The 2 significant findings are anterior mediastinal mass with partial calcification and also possible interstitial lung disease and therefore she has been referred here.  She is denying any acute complaints . SHe has appt with Dr Nils Pyle for anterior mediastinal mass  We performed the pulmonary integrated interstitial lung disease questionnaire and for the symptoms she denied any cough but she did admit to shortness of breath that started insidiously approximately 2 years ago and for the last 6 months is gradually gotten worse.  It is not episodic.  It is present on exertion and relieved by rest.  Today when we walked her she did drop her pulse ox greater than 3 points but did not go below 88% her heart rate did respond but it did not go over 90/min.  On a level 5 scale she rates level 3 dyspnea for picking up and straightening, sweeping and vacuuming walking up a hill and walking upstairs.  She rates a level 2 dyspnea for walking on a level ground at her own pace and walking with appears, making the bed, shopping and doing laundry.  She gives a level 1 dyspnea for standing up and dressing but otherwise all other activities a level 0.  Review of systems: She has chronic fatigue and arthralgia.  She also has dysphagia for the last  few to several years [4-5 years].  She has had dry eyes for the last few decades and dry mouth for the last 1 year.  She admits to acid reflux disease for the last 40 years and on and off cold sores in her mouth.  There is no recurrent fever or weight loss or Raynaud  Past medical history: Positive for acid reflux for several decades but negative for asthma, COPD, heart failure, rheumatoid arthritis, other collagen vascular disease, sleep apnea, immune system disorder, pulmonary hypertension, thyroid disease, stroke, seizures, hepatitis, tuberculosis, kidney disease, pneumonia or blood clots, heart disease pleurisy  Family history of lung disease: Denies any pulmonary fibrosis or any other lung disease  Exposure history: Denies any smoking, drugs of abuse.  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  In particular she denies any occupation that might predispose her to hypersensitivity pneumonitis or inorganic dust related interstitial lung disease.  Pulmonary toxicity history: Extensive drug list causing pulmonary toxicity reviewed.  And negative for all of that     CT chest 06/19/17 = personally visualized the CT chest and agree with the findings of large anterior mediastinal mass and possible ILD  - IMPRESSION: 1. No focal soft tissue mass or other abnormality to account for the perceived  palpable abnormality in the suprasternal region. 2. However, there is a large anterior mediastinal mass which demonstrates heterogeneous enhancement and calcification, with several smaller adjacent lesions. These findings are concerning for potential neoplasm, with primary differential considerations including germ cell neoplasm or lesion of thymic origin. On PET scan 07/16/17 - 4.6 cm anterior mediastinal mass with dense central calcifications is  hypermetabolic. SUV max is 6.55. Findings highly suspicious for a thymic neoplasmThoracic surgical consultation is strongly recommended. 3. 1.7 x 2.0 cm left adrenal nodule is indeterminate. Further characterization with nonemergent adrenal protocol CT scan is recommended in the near future to exclude the possibility of a metastatic lesion. 4. Unusual appearance of the lung parenchyma which is concerning for potential interstitial lung disease. Outpatient referral to pulmonology for further evaluation is suggested, as well as a follow-up high-resolution chest CT in 6-12 months to assess for temporal changes in the appearance of the lung parenchyma. 5. Multiple healing left-sided rib fractures, as above. These are all nondisplaced or minimally displaced. No associated pneumothorax noted at this time.   Electronically Signed   By: Vinnie Langton M.D.   On: 06/20/2017 09:20   Results for [name], [name] (MRN [id number]) as of 07/03/2017 13:34  Ref. Range 07/26/2011 08:59  Hemoglobin Latest Ref Range: 12.0 - 15.0 g/dL 20.7 (H)    OV 08/23/2017  Chief Complaint  Patient presents with  . Follow-up    Pt was hospitalized 7/2-7/7 due to having mediastinal mass resection and a lung biopsy was also performed. HRCT performed 6/3, PFT performed 6/5, and PET scan performed 6/10.  Pt states overall she has been doing good but is anxious to know about results from biopsy.    Follow-up mediastinal massAnteriorand PET hot Follow-up interstitial lung disease  She has now been discharged from post biopsy. She is doing well. She feels baseline. Shortness of breath with exertion is the same and it is mild. Walking desaturation test is documented below is unchanged. A biopsy date was 08/07/2017. The anterior mediastinal mass shows Castleman's disease. Based on PET scan I suspect this is unicentric. She has follow-up with thoracic service is pending. She also had open lung biopsy for early  interstitial lung disease. The formal report is still pending but I discussed with pathologist Dr. Vic Ripper who feels that this is very early and mild interstitial lung disease without any specific pattern but suspects this is early NSIP. There is no granuloma.     OV 11/26/2017  Subjective:  Patient ID: Alison Ayala, female , DOB: 11-02-1945 , age 28 y.o. , MRN: [id number] , ADDRESS: [address] [place] [address]   11/26/2017 -   Chief Complaint  Patient presents with  . Follow-up    Doing well feeling the same as last visit.    Follow-up mediastinal massAnteriorand PET hot = Castleman's disease, you are eccentric based on PET scan, July 2019 Follow-up interstitial lung disease -likely NSIP, July 2019   HPI Giovanni Bath Klasen 73 y.o. -at this point in time she returns for follow-up for interstitial lung disease.  Since her last visit in July 2019 we had a multidisciplinary case conference in August 2019.  Despite the air trapping seen on CT scan of the chest her lung biopsy has been read out as an SIP.  There was a tendency for some multinucleated giant cells but no clear-cut granuloma.  These are few and far between.  I did a history retake for hypersensitivity pneumonitis.  She vehemently denies any  mold or mildew exposure.  She does not use any wind instruments.  She does not do any farming or guarding.  No exposure to rotten wood on mulch.  No pet hamsters or gerbils or birds.  No using feather pillows.  No misty fountain in the house.  No nebulizer use no CPAP use.  No humidifier use.  The house is only 73 years old.  Even in her prior jobs there is no mold exposure.  She is currently exercising.  Her walking desaturation test seems to have improved.    OV 02/26/2018  Subjective:  Patient ID: Alison Ayala, female , DOB: April 17, 1945 , age 14 y.o. , MRN: [id number] , ADDRESS: [place]. [place] [address]   Follow-up mediastinal mass Anteriorand  PET hot = Castleman's disease,  based on PET scan, July 2019 - biopsy provine Follow-up interstitial lung disease -likely early/mind NSIP, July 2019 - on surgical lung biopsy  02/26/2018 -   Chief Complaint  Patient presents with  . Follow-up    Pt states she has been doing well since last visit. States she still becomes SOB with exertion.     HPI SERINE KEA 73 y.o. -seen October 2019 for the above issues.  She is not taking any prednisone.  She does not want to do it because of side effects.  In the interim she is continued to do well.  She has mild class II dyspnea on exertion relieved by rest.  This is stable.  No new interim issues you are up-to-date with a flu shot.  She had pulmonary function test that shows improvement in FVC with slight decline in DLCO/stable DLCO.  But overall she feels good.  Walking desaturation test shows no change.     Simple office walk 185 feet x  3 laps goal with forehead probe 07/03/2017  08/23/2017  11/26/2017  02/26/2018   O2 used Room air Room air Room air rooom air  Number laps completed 3 3 3 3   Comments about pace  normal  Good pace  Resting Pulse Ox/HR 96% and 64/min 97% and HR 87/min  96% and 73/min 100% and 66/min  Final Pulse Ox/HR 92% and 88/min 92% and HR 100/min  96% and 91/min 95% and 95/min  Desaturated </= 88% no0 no  no no  Desaturated <= 3% points yes yes  no no  Got Tachycardic >/= 90/min no yes  yes yes  Symptoms at end of test Dyspnea reproduced Mild fatigue only  No complaints  Miscellaneous comments Normal pace slow stable  improved     Results for KEYONI, LAPINSKI (MRN [id number]) as of 02/26/2018 15:32  Ref. Range 07/11/2017 09:49 02/26/2018 13:48  FVC-Pre Latest Units: L 2.14 2.55  FVC-%Pred-Pre Latest Units: % 73 88   Results for ELLIOTTE, MARSALIS (MRN [id number]) as of 02/26/2018 15:32  Ref. Range 07/11/2017 09:49 02/26/2018 13:48  DLCO unc Latest Units: ml/min/mmHg 19.55 18.35  DLCO unc % pred Latest Units: % 80 75     ROS - per HPI     has a past medical history of Carpal tunnel syndrome of right wrist, Diverticulosis, GERD (gastroesophageal reflux disease), H/O hiatal hernia, H/O intraventricular hemorrhage (2004--  SPONTANEOUS), Heart murmur, Hip pain, right (OCCASIONAL -- PINCHED NERVE), History of gastric ulcer (2001), Hyperlipidemia, Hypertension, Hypoglycemia, MVP (mitral valve prolapse), Normal echocardiogram (30 YRS AGO), S/P ventriculoperitoneal shunt (2004--- PER PT SHUNT IS WORKING WELL --  NO ISSUES), and Squamous cell cancer of scalp and  skin of neck.   reports that she has never smoked. She has never used smokeless tobacco.  Past Surgical History:  Procedure Laterality Date  . BILATERAL FRONTAL INTERVENTRICULAR CATHETER VIA TWIST DRILL HOLE  AUG 2004   SPONTANEOUS SUBARACHNOID  HEMORRHAGE AND  INTRAVENTRICULAR HEMORRHAGE  . CARPAL TUNNEL RELEASE  07/26/2011   Procedure: CARPAL TUNNEL RELEASE;  Surgeon: Magnus Sinning, MD;  Location: [facility];  Service: Orthopedics;  Laterality: Right;  . CATARACT EXTRACTION W/ INTRAOCULAR LENS  IMPLANT, BILATERAL  2012   BILATERAL  . DUPUYTREN CONTRACTURE RELEASE  07/26/2011   Procedure: DUPUYTREN CONTRACTURE RELEASE;  Surgeon: Magnus Sinning, MD;  Location: [facility];  Service: Orthopedics;  Laterality: Right;  EXCISION OF DUPYTRENS BAND THUMB- INDEX WEB SPACE  . LUNG BIOPSY Right 08/07/2017   Procedure: Open LUNG BIOPSY;  Surgeon: Ivin Poot, MD;  Location: [facility];  Service: Thoracic;  Laterality: Right;  . RESECTION OF MEDIASTINAL MASS N/A 08/07/2017   Procedure: RESECTION OF MEDIASTINAL MASS;  Surgeon: Ivin Poot, MD;  Location: [facility];  Service: Thoracic;  Laterality: N/A;  . RETINAL DETACHMENT REPAIR W/ SCLERAL BUCKLE LE  03-10-2011   LEFT EYE  . STERNOTOMY N/A 08/07/2017   Procedure: STERNOTOMY;  Surgeon: Prescott Gum, Collier Salina, MD;  Location: [facility];  Service: Thoracic;  Laterality: N/A;  .  VENTRICULOPERITONEAL SHUNT  10-08-2002   RIGHT FRONTAL FOR COMMUNICATING HYDROCEPHALUS    Allergies  Allergen Reactions  . Carvedilol Palpitations and Other (See Comments)    Abdominal bloating  . Atorvastatin     fatigue  . Claritin [Loratadine] Other (See Comments)    Blurred vision and dry eyes  . Lisinopril Other (See Comments)    "drunk feeling"    Immunization History  Administered Date(s) Administered  . Influenza, High Dose Seasonal PF 10/07/2016, 11/06/2017    Family History  Problem Relation Age of Onset  . Colon cancer Mother 107  . Stomach cancer Maternal Aunt   . Breast cancer Maternal Aunt   . Uterine cancer Maternal Aunt      Current Outpatient Medications:  .  acetaminophen (TYLENOL) 500 MG tablet, Take 1-2 tablets (500-1,000 mg total) by mouth 2 (two) times daily as needed for moderate pain or headache., Disp: 30 tablet, Rfl: 0 .  amLODipine (NORVASC) 5 MG tablet, Take 5 mg by mouth daily., Disp: , Rfl:  .  buPROPion (WELLBUTRIN XL) 150 MG 24 hr tablet, Take 150 mg by mouth daily. , Disp: , Rfl:  .  esomeprazole (NEXIUM) 40 MG capsule, Take 40 mg by mouth daily. , Disp: , Rfl:  .  hydrochlorothiazide (HYDRODIURIL) 25 MG tablet, Take 25 mg by mouth daily., Disp: , Rfl:  .  ibuprofen (ADVIL,MOTRIN) 200 MG tablet, Take 400-600 mg by mouth 2 (two) times daily as needed for headache or moderate pain., Disp: , Rfl:  .  metoprolol succinate (TOPROL-XL) 100 MG 24 hr tablet, Take 100 mg by mouth daily. Take with or immediately following a meal., Disp: , Rfl:  .  Polyethylene Glycol 400 (BLINK TEARS OP), Place 1 drop into both eyes daily as needed (dry eyes)., Disp: , Rfl:  .  Polyvinyl Alcohol (LIQUID TEARS OP), Place 1 drop into both eyes daily as needed (dry eyes)., Disp: , Rfl:  .  pravastatin (PRAVACHOL) 80 MG tablet, Take 1 tablet (80 mg total) by mouth daily., Disp: , Rfl:       Objective:   Vitals:   02/26/18  1439  BP: 136/62  Pulse: 66  SpO2: 100%    Weight: 224 lb (101.6 kg)  Height: 5\' 4"  (1.626 m)    Estimated body mass index is 38.45 kg/m as calculated from the following:   Height as of this encounter: 5\' 4"  (1.626 m).   Weight as of this encounter: 224 lb (101.6 kg).  @WEIGHTCHANGE @  [employer]   02/26/18 1439  Weight: 224 lb (101.6 kg)     Physical Exam  General Appearance:    Alert, cooperative, no distress, appears stated age - yes , Deconditioned looking - no , OBESE  - yes, Sitting on Wheelchair -  no  Head:    Normocephalic, without obvious abnormality, atraumatic  Eyes:    PERRL, conjunctiva/corneas clear,  Ears:    Normal TM's and external ear canals, both ears  Nose:   Nares normal, septum midline, mucosa normal, no drainage    or sinus tenderness. OXYGEN ON  - no . Patient is @ ra   Throat:   Lips, mucosa, and tongue normal; teeth and gums normal. Cyanosis on lips - no  Neck:   Supple, symmetrical, trachea midline, no adenopathy;    thyroid:  no enlargement/tenderness/nodules; no carotid   bruit or JVD  Back:     Symmetric, no curvature, ROM normal, no CVA tenderness  Lungs:     Distress - no , Wheeze no, Barrell Chest - no, Purse lip breathing - no, Crackles - maybe at base. Central chest scar +   Chest Wall:    No tenderness or deformity.    Heart:    Regular rate and rhythm, S1 and S2 normal, no rub   or gallop, Murmur - no  Breast Exam:    NOT DONE  Abdomen:     Soft, non-tender, bowel sounds active all four quadrants,    no masses, no organomegaly. Visceral obesity - yes  Genitalia:   NOT DONE  Rectal:   NOT DONE  Extremities:   Extremities - normal, Has Cane - no, Clubbing - no, Edema - no  Pulses:   2+ and symmetric all extremities  Skin:   Stigmata of Connective Tissue Disease - no  Lymph nodes:   Cervical, supraclavicular, and axillary nodes normal  Psychiatric:  Neurologic:   Pleasant - yes, Anxious - no, Flat affect - no  CAm-ICU - neg, Alert and Oriented x 3 - yes, Moves all 4s - yes,  Speech - normal, Cognition - intact           Assessment:       ICD-10-CM   1. NSIP (nonspecific interstitial pneumonia) ([facility]) J84.89 Pulmonary function test  2. ILD (interstitial lung disease) ([facility]) J84.9 Pulmonary function test  3. Unicentric Castleman disease ([facility]) D47.Z2   4. Interstitial pulmonary disease ([facility]) J84.9 CT Chest High Resolution       Plan:     Patient Instructions     ICD-10-CM   1. NSIP (nonspecific interstitial pneumonia) ([facility]) J84.89   2. ILD (interstitial lung disease) ([facility]) J84.9   3. Unicentric Castleman disease ([facility]) D47.Z2     Stable disease  Plan - continue to monitor off prednisone  - repeat HRCT in 6 months  - hold off breathing test at followup - can decide clinically - if lungs ever get worse, a drug called Ofev is an option  Followup 6 months but after HRCT scan also in 6 months  (> 50% of this 15 min visit spent in face to  face counseling or/and coordination of care by this undersigned MD - Dr Brand Males. This includes one or more of the following documented above: discussion of test results, diagnostic or treatment recommendations, prognosis, risks and benefits of management options, instructions, education, compliance or risk-factor reduction)   SIGNATURE    Dr. Brand Males, M.D., F.C.C.P,  Pulmonary and Critical Care Medicine Staff Physician, [facility] Director - Interstitial Lung Disease  Program  [facility] [place], [place], [address]  Pager: [phone], If no answer or between  15:00h - 7:00h: call [phone] Telephone: [phone]  3:51 PM 02/26/2018

## 2018-02-26 NOTE — Progress Notes (Signed)
PFT done today. 

## 2018-03-11 DIAGNOSIS — E785 Hyperlipidemia, unspecified: Secondary | ICD-10-CM | POA: Diagnosis not present

## 2018-03-11 DIAGNOSIS — R7303 Prediabetes: Secondary | ICD-10-CM | POA: Diagnosis not present

## 2018-03-11 DIAGNOSIS — I10 Essential (primary) hypertension: Secondary | ICD-10-CM | POA: Diagnosis not present

## 2018-03-11 DIAGNOSIS — E538 Deficiency of other specified B group vitamins: Secondary | ICD-10-CM | POA: Diagnosis not present

## 2018-03-11 DIAGNOSIS — F325 Major depressive disorder, single episode, in full remission: Secondary | ICD-10-CM | POA: Diagnosis not present

## 2018-03-11 DIAGNOSIS — E559 Vitamin D deficiency, unspecified: Secondary | ICD-10-CM | POA: Diagnosis not present

## 2018-03-29 DIAGNOSIS — L820 Inflamed seborrheic keratosis: Secondary | ICD-10-CM | POA: Diagnosis not present

## 2018-03-29 DIAGNOSIS — L570 Actinic keratosis: Secondary | ICD-10-CM | POA: Diagnosis not present

## 2018-03-29 DIAGNOSIS — Z85828 Personal history of other malignant neoplasm of skin: Secondary | ICD-10-CM | POA: Diagnosis not present

## 2018-03-29 DIAGNOSIS — L821 Other seborrheic keratosis: Secondary | ICD-10-CM | POA: Diagnosis not present

## 2018-03-29 DIAGNOSIS — D2261 Melanocytic nevi of right upper limb, including shoulder: Secondary | ICD-10-CM | POA: Diagnosis not present

## 2018-03-29 DIAGNOSIS — D1801 Hemangioma of skin and subcutaneous tissue: Secondary | ICD-10-CM | POA: Diagnosis not present

## 2018-03-29 DIAGNOSIS — L918 Other hypertrophic disorders of the skin: Secondary | ICD-10-CM | POA: Diagnosis not present

## 2018-07-10 ENCOUNTER — Other Ambulatory Visit: Payer: Self-pay | Admitting: *Deleted

## 2018-07-10 DIAGNOSIS — J8489 Other specified interstitial pulmonary diseases: Secondary | ICD-10-CM

## 2018-07-10 DIAGNOSIS — J849 Interstitial pulmonary disease, unspecified: Secondary | ICD-10-CM

## 2018-07-10 NOTE — Progress Notes (Signed)
Order for BMET placed due to pt going to be having CT with contrast and due to her age labwork needed to be performed. Nothing further needed.

## 2018-07-22 ENCOUNTER — Telehealth: Payer: Self-pay | Admitting: Internal Medicine

## 2018-07-22 NOTE — Telephone Encounter (Signed)
Staff message received which I have posted below: McMichael, Agapito Games, MD  Cc: Lezlee Gills, Waldemar Dickens, CMA        Orders have been placed for pt to have CT Chest with Contrast and CT Chest High Res. Stacey at [facility] is getting ready to schedule this & she wants to verify pt is to have both CT's. She did state it would be expensive to pt because 2 separate orders and 2 separate CT's. Please let me know.   Thanks  [employer] from [facility] 02/26/2018   ICD-10-CM   1. NSIP (nonspecific interstitial pneumonia) ([facility]) J84.89   2. ILD (interstitial lung disease) ([facility]) J84.9   3. Unicentric Castleman disease ([facility]) D47.Z2    Stable disease  Plan - continue to monitor off prednisone  - repeat HRCT in 6 months  - hold off breathing test at followup - can decide clinically - if lungs ever get worse, a drug called Ofev is an option  Followup 6 months but after HRCT scan also in 6 months     After reviewing the last OV pt had with MR and the instructions, I have cancelled the order for the CT with contrast as MR said to repeat HRCT. Nothing further needed.

## 2018-08-16 IMAGING — DX DG CHEST 1V PORT
1 series · 1 of 1 positions shown · non-contrast
Comparison: 08/07/2017 chest radiograph and prior studies

CLINICAL DATA: Status post resection of mediastinal mass and open
lung biopsy.

EXAM:
PORTABLE CHEST 1 VIEW

[chest]
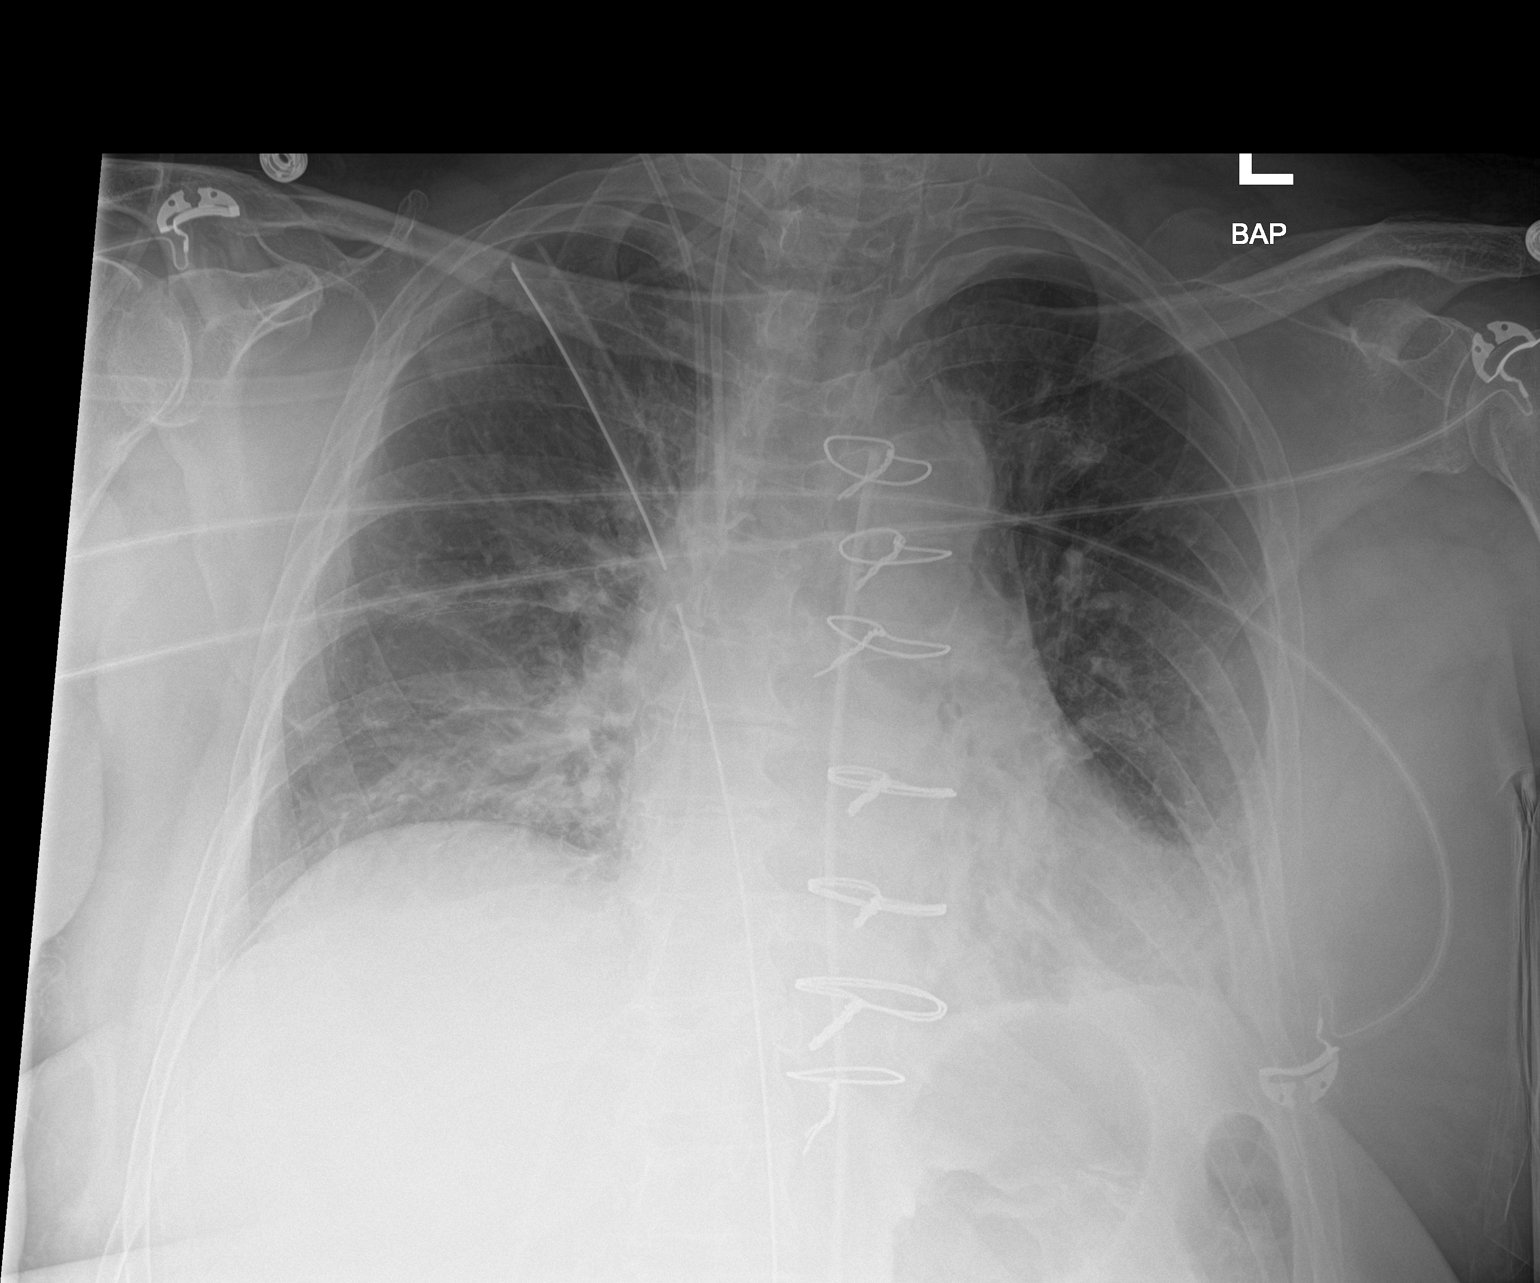

[1 of 1 positions shown; findings below may reference images not displayed]

FINDINGS: Median sternotomy wires now noted.

A RIGHT IJ central venous catheter with tip overlying the mid SVC,
and mediastinal and RIGHT thoracostomy tubes are now noted.

There is no evidence of pneumothorax.

Pulmonary vascular congestion and bibasilar atelectasis identified.
IMPRESSION: Status post median sternotomy and thoracic surgery with support
apparatus described. No pneumothorax.

Pulmonary vascular congestion and bibasilar atelectasis.

## 2018-08-18 IMAGING — DX DG CHEST 1V PORT
1 series · 1 of 1 positions shown · non-contrast
Comparison: 08/08/2017

CLINICAL DATA: Chest tube removal

EXAM:
PORTABLE CHEST 1 VIEW

[chest ap]
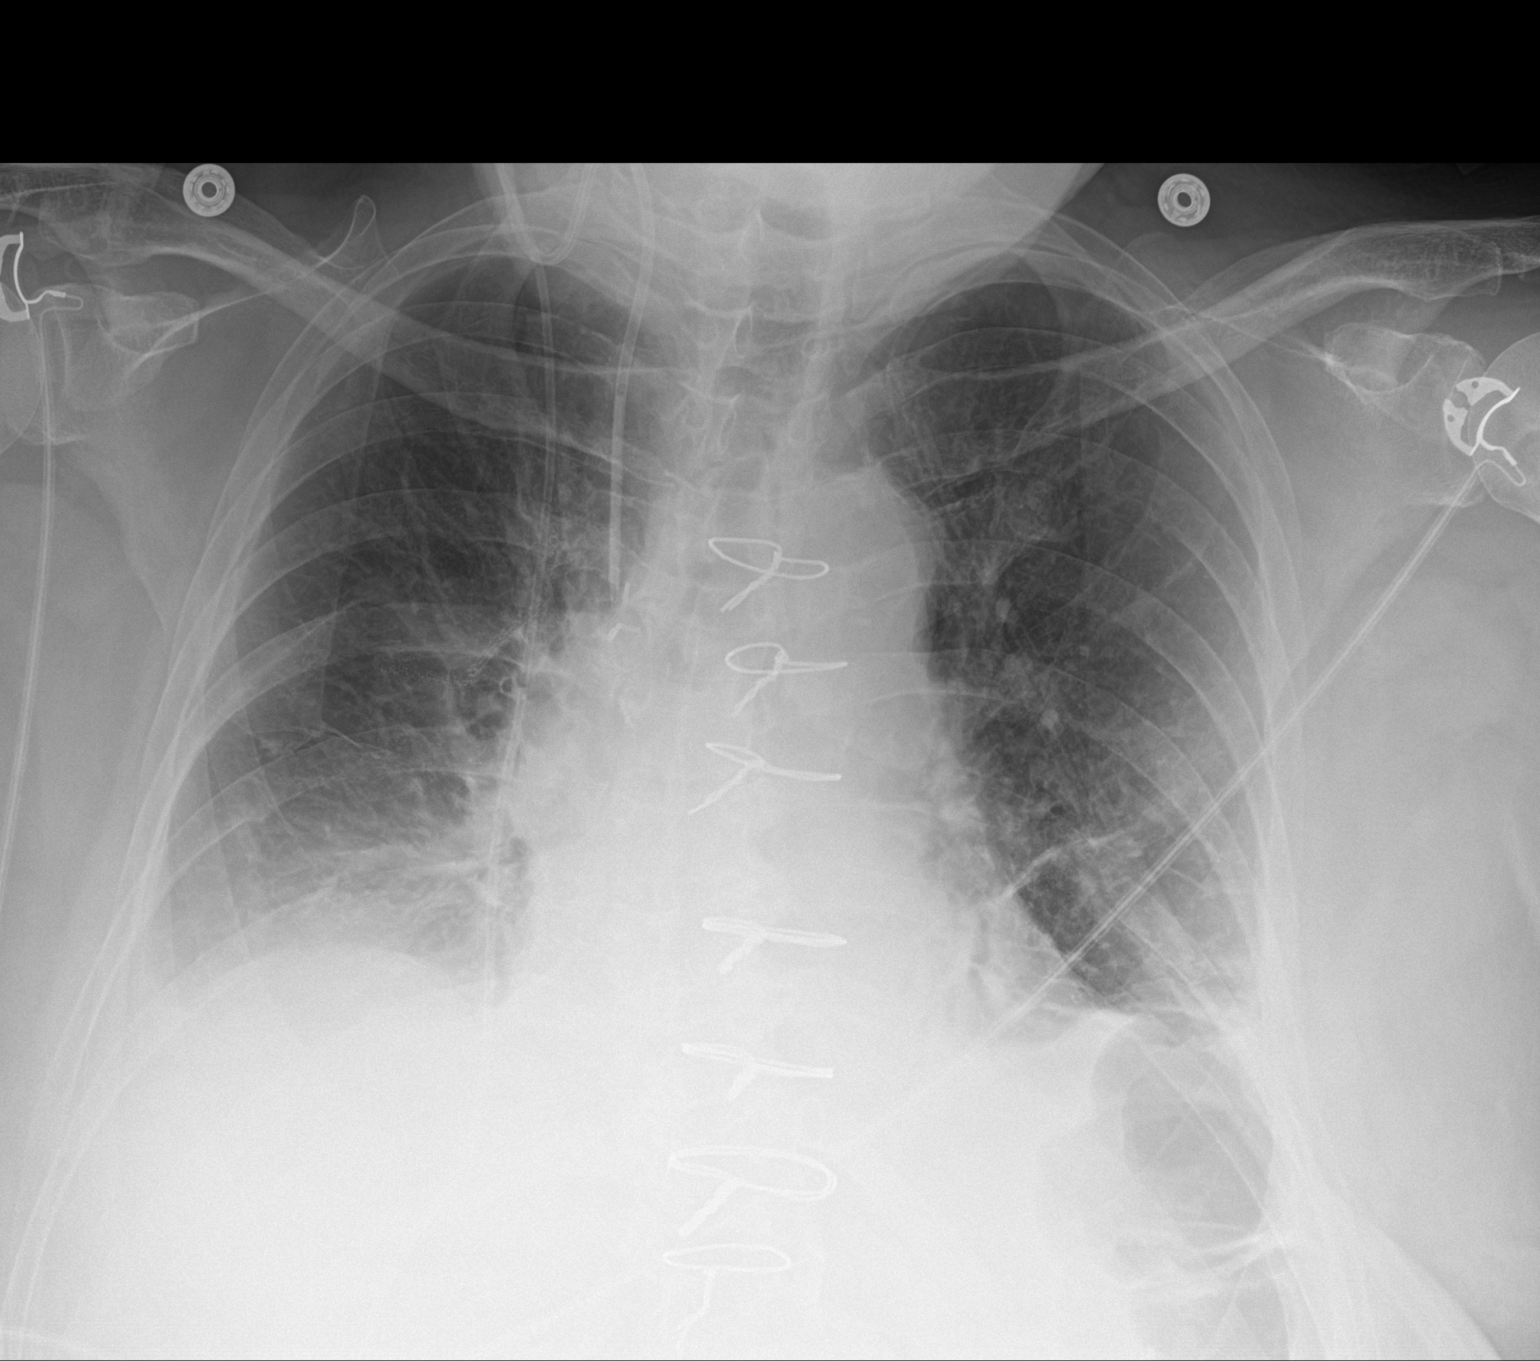

[1 of 1 positions shown; findings below may reference images not displayed]

FINDINGS: Right-sided chest tube as well as a mediastinal drain have been
removed in the interval from the prior exam. No recurrent
pneumothorax is noted. Right jugular central line and
ventriculoperitoneal shunt catheter are again noted. Stable left
basilar atelectasis is noted. New right basilar atelectasis with
small right effusion is seen.
IMPRESSION: No pneumothorax following chest tube removal.

Mild right basilar atelectasis and small effusion are seen.

## 2018-08-21 IMAGING — DX DG CHEST 2V
2 series · 2 of 2 positions shown · non-contrast
Comparison: 08/10/2017.

CLINICAL DATA: Pleural effusion.

EXAM:
CHEST - 2 VIEW

[chest pa]
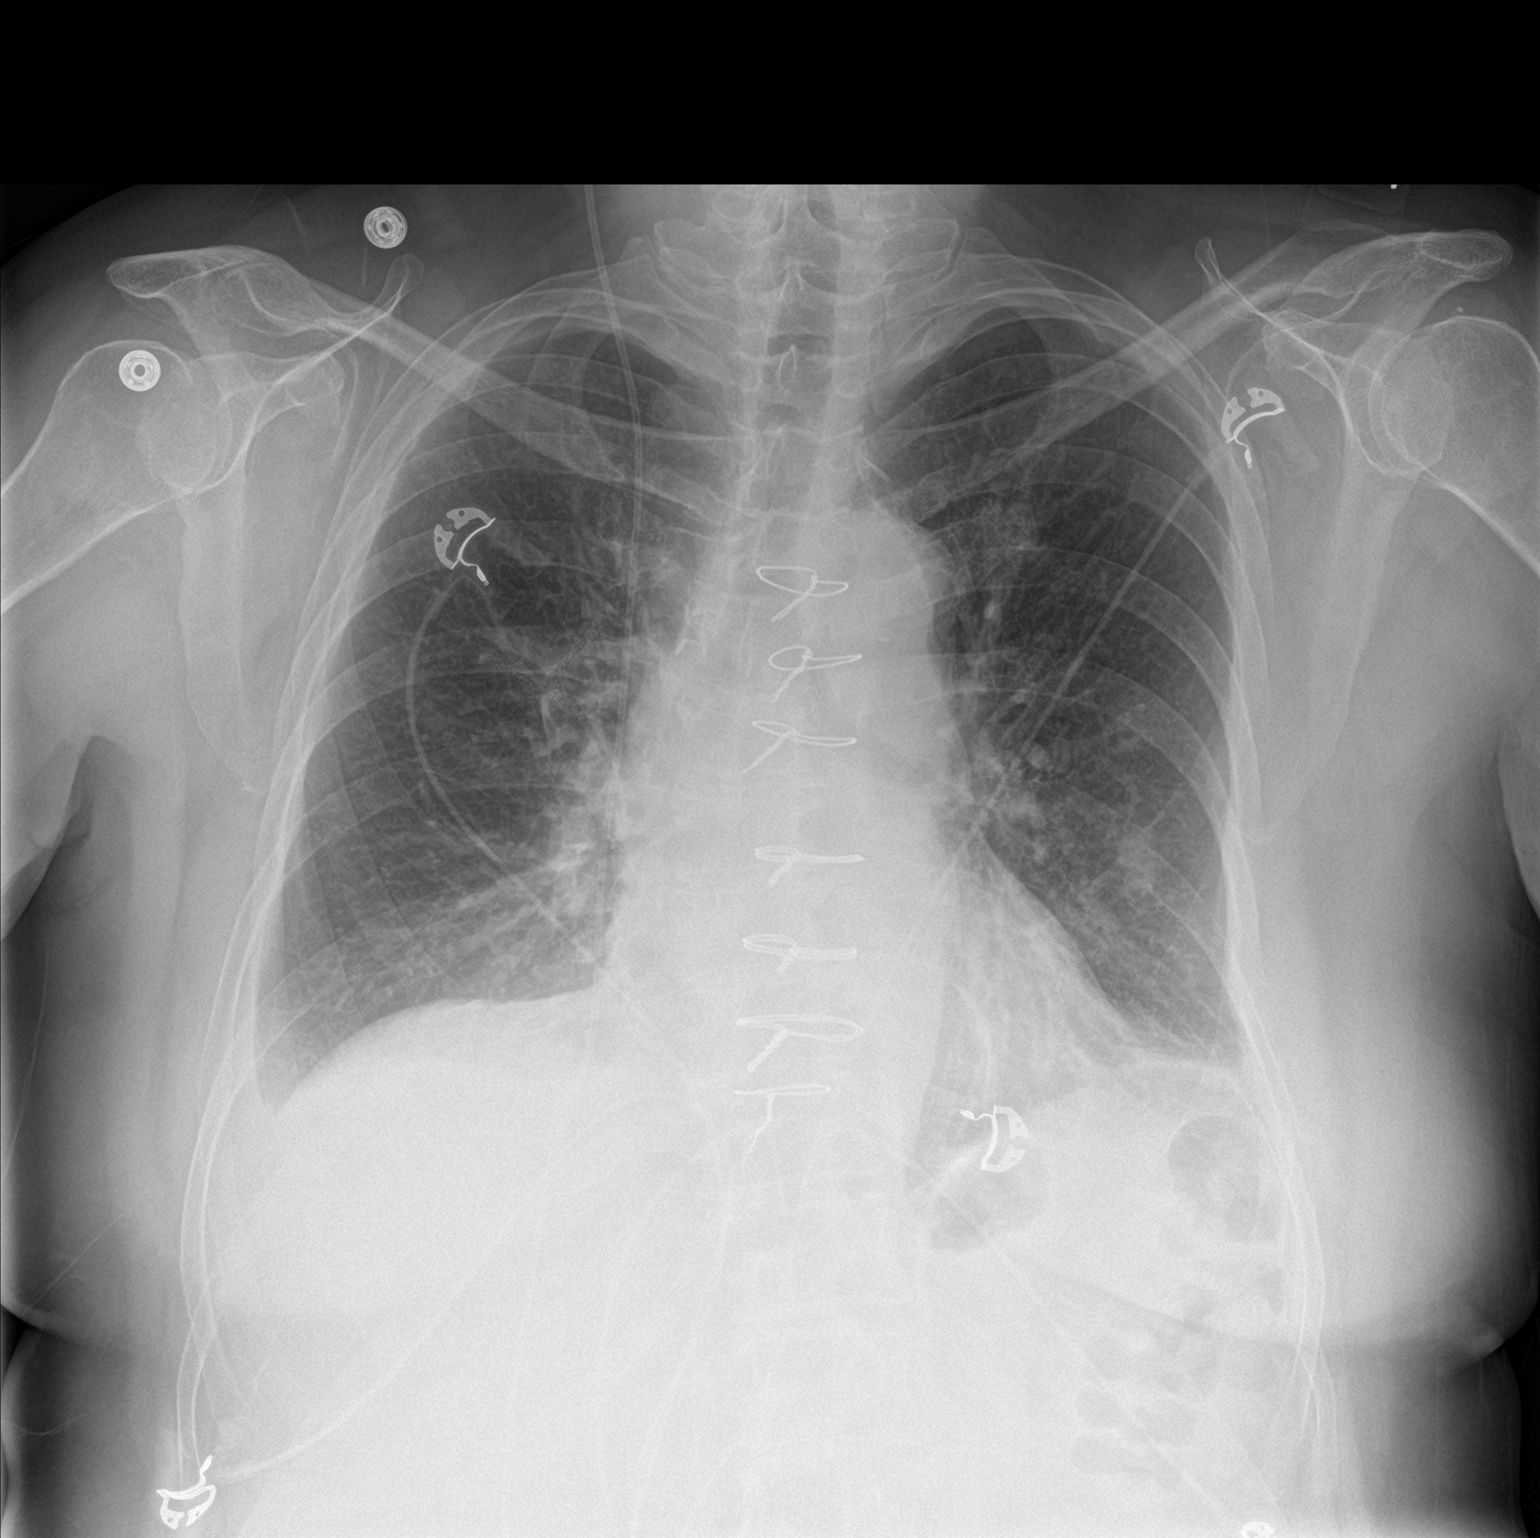

[chest lat]
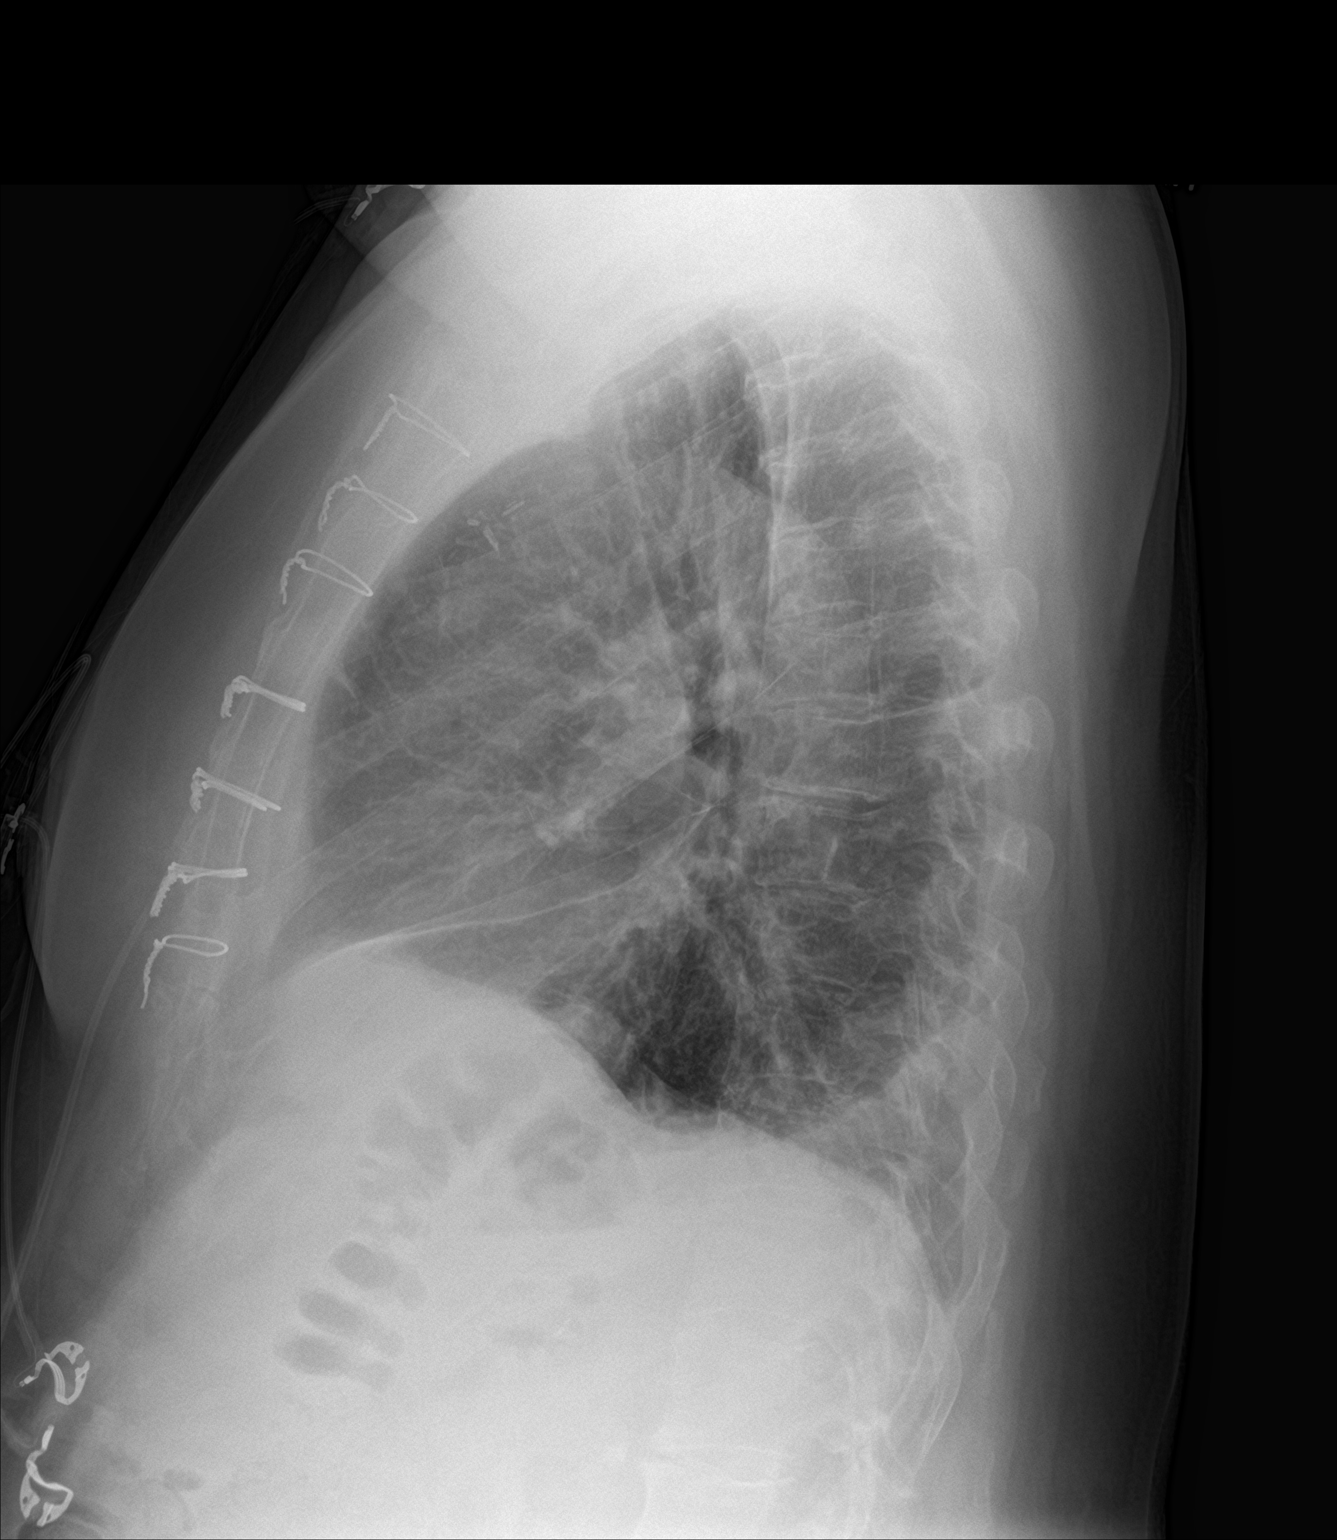

[2 of 2 positions shown; findings below may reference images not displayed]

FINDINGS: Cardiac size within normal limits. Median sternotomy for removal of
previous mediastinal mass reportedly thymoma. No consolidation or
edema. Small BILATERAL pleural effusions. Ventricular peritoneal
shunt catheter RIGHT chest wall.
IMPRESSION: Stable aeration.  No active disease.

## 2018-08-30 ENCOUNTER — Telehealth: Payer: Self-pay | Admitting: Internal Medicine

## 2018-08-30 NOTE — Telephone Encounter (Signed)

## 2018-09-02 ENCOUNTER — Other Ambulatory Visit: Payer: Medicare HMO

## 2018-09-02 ENCOUNTER — Ambulatory Visit (INDEPENDENT_AMBULATORY_CARE_PROVIDER_SITE_OTHER)
Admission: RE | Admit: 2018-09-02 | Discharge: 2018-09-02 | Disposition: A | Discharge status: Home / Self Care | Payer: Medicare HMO | Source: Ambulatory Visit | Attending: Internal Medicine | Admitting: Internal Medicine

## 2018-09-02 ENCOUNTER — Other Ambulatory Visit: Payer: Self-pay

## 2018-09-02 DIAGNOSIS — J849 Interstitial pulmonary disease, unspecified: Secondary | ICD-10-CM | POA: Diagnosis not present

## 2018-09-02 DIAGNOSIS — R918 Other nonspecific abnormal finding of lung field: Secondary | ICD-10-CM | POA: Diagnosis not present

## 2018-09-17 ENCOUNTER — Telehealth: Payer: Self-pay | Admitting: Internal Medicine

## 2018-09-19 NOTE — Telephone Encounter (Signed)
L/M on pt vm to call back to schedule pft -pr  °

## 2018-09-20 NOTE — Telephone Encounter (Signed)
Alison Ayala, can you please look at pt's CT and advise of the results. Thanks!

## 2018-09-20 NOTE — Telephone Encounter (Signed)
Patient called back - scheduled PFT on 10/08/2018. Patient is also wanting CT results - she can be reached at [phone]. -pr

## 2018-09-20 NOTE — Telephone Encounter (Signed)
MR, please advise on the results of pt's CT. Thanks!

## 2018-09-20 NOTE — Telephone Encounter (Signed)
Please route this to Dr. Chase Caller.  This is his patient and his CT scan he ordered.  He can provide the patient with those results.  If he is unable to do that and the patient can either have these reviewed on 10/08/2018 or patient can be scheduled for a video visit to further review.Triage APP is cannot continue to manage all imaging results for all providers.  First step would be routing to Dr. Chase Caller for him to follow-up on.  Wyn Quaker FNP

## 2018-09-20 NOTE — Telephone Encounter (Signed)
Called and spoke with pt letting her know the info stated by MR about the CT scan. Stated to her that we would still keep her PFT and OV with Aaron Edelman on as scheduled but then after that, we would not need to see her for another 6 months unless she developed any worsening symptoms. Pt verbalized understanding. Nothing further needed.

## 2018-09-20 NOTE — Telephone Encounter (Signed)
Signnificantly improved CT from a year ago   Plan - if she is feeling well - can just make a ILD clinic visit in 6 months from 09/20/2018     SIGNATURE    Dr. Brand Males, M.D., F.C.C.P,  Pulmonary and Critical Care Medicine Staff Physician, [place] Director - Interstitial Lung Disease  Program  Pulmonary [facility] [place], [place], [address]  Pager: [phone], If no answer or between  15:00h - 7:00h: call [phone] Telephone: [phone]  3:50 PM 09/20/2018       CT chest  CLINICAL DATA:  Interstitial lung disease  EXAM: CT CHEST WITHOUT CONTRAST  TECHNIQUE: Multidetector CT imaging of the chest was performed following the standard protocol without intravenous contrast. High resolution imaging of the lungs, as well as inspiratory and expiratory imaging, was performed.  COMPARISON:  CT chest, 07/09/2017, 06/19/2017, PET-CT  FINDINGS: Cardiovascular: Aortic atherosclerosis. Normal heart size. No pericardial effusion.  Mediastinum/Nodes: No enlarged mediastinal, hilar, or axillary lymph nodes. Interval resection of a previously seen anterior mediastinal mass. Small hiatal hernia. Thyroid gland, trachea, and esophagus demonstrate no significant findings.  Lungs/Pleura: Previously seen bilateral, bibasilar predominant ground-glass opacities on CT dated 07/09/2017 and PET-CT dated 07/16/2017 are mostly resolved. There is very minimal subpleural reticular and ground-glass opacity without a clear gradient. There is discrete bandlike scarring of the left lung base. Evidence of prior right lung wedge biopsies. There is mild lobular air trapping on expiratory phase imaging. No pleural effusion or pneumothorax.  Upper Abdomen: No acute abnormality. Benign, fat containing left adrenal adenoma.  Musculoskeletal: No chest wall mass or suspicious bone  lesions identified.  IMPRESSION: 1. Previously seen bilateral, bibasilar predominant ground-glass opacities on CT dated 07/09/2017 and PET-CT dated 07/16/2017 are mostly resolved. There is very minimal subpleural reticular and ground-glass opacity without a clear gradient. Mild lobular air trapping on expiratory phase imaging. Findings remain in keeping with very mild NSIP pattern fibrosis as demonstrated by wedge biopsy histology. More extensive ground-glass present on prior examinations may have reflected acute inflammatory flare of interstitial lung disease or other incidental nonspecific infection or inflammation present at that time. Consider ongoing follow-up to assess for long-term stability of fibrotic findings.  2. Interval resection of a previously seen anterior mediastinal mass.  3.  Aortic atherosclerosis.   Electronically Signed   By: Eddie Candle M.D.   On: 09/02/2018 13:40

## 2018-09-26 ENCOUNTER — Other Ambulatory Visit: Payer: Self-pay | Admitting: Internal Medicine

## 2018-10-04 ENCOUNTER — Other Ambulatory Visit (HOSPITAL_COMMUNITY)
Admission: RE | Admit: 2018-10-04 | Discharge: 2018-10-04 | Disposition: A | Discharge status: Home / Self Care | Payer: Medicare HMO | Source: Ambulatory Visit | Attending: Internal Medicine | Admitting: Internal Medicine

## 2018-10-04 DIAGNOSIS — Z01812 Encounter for preprocedural laboratory examination: Secondary | ICD-10-CM | POA: Insufficient documentation

## 2018-10-04 DIAGNOSIS — Z20828 Contact with and (suspected) exposure to other viral communicable diseases: Secondary | ICD-10-CM | POA: Diagnosis not present

## 2018-10-04 LAB — SARS CORONAVIRUS 2 (TAT 6-24 HRS): SARS Coronavirus 2: NEGATIVE

## 2018-10-07 NOTE — Progress Notes (Signed)
@Patient  ID: Alison Ayala, female    DOB: 1945-10-17, 72 y.o.   MRN: [id number]  Chief Complaint  Patient presents with  . Follow-up    F/U for ILD and PFT results. States breathing has not changed since last visit.     Referring provider: Harlan Stains, MD  HPI:  73 year old female never smoker followed in our office for ILD/NSIP  PMH: Hypertension Smoker/ Smoking History: Never smoker Maintenance:  None  Pt of: Dr. Chase Caller  10/08/2018  - Visit   73 year old female never smoker followed in our office for NSIP.  Patient presenting to our office today to further review recent high-resolution CT chest results as well as pulmonary function testing she completed today.  Results are listed below:  09/02/2018-CT chest high-res- previously seen bilateral bibasilar predominant groundglass opacities on CT dated on 07/09/2017 and PET/CT dated on 07/16/2017 are mostly resolved there is very minimal subpleural reticular groundglass opacity without clear gradient, findings remain in keeping with a very mild NSIP pattern, which was demonstrated by wedge biopsy histology.  May need to consider ongoing follow-up to assess for long-term stability of fibrotic findings, interval resection of the previously seen anterior mediastinal mass.  10/08/2018-spirometry with DLCO-FVC 2.74 (93% predicted), ratio 87, FEV1 2.39 (107% predicted), DLCO 17.78 (90% predicted)  Overall the patient is doing quite well and has no current respiratory concerns.  Patient does admit she has been having occasional right upper quadrant pain after eating.  She is unable to tell me today if this is what specific [place] fatty foods.  Patient believes that it may be.  She has concerns that she may need to have her gallbladder removed.  No current pain at this time she does admits that the pain happens intermittently.  She is planning on discussing it with primary care in November/2020.   Tests:   10/04/2018-SARS-CoV-2-negative   07/09/2017-CT chest high-res- basilar predominant patchy and subpleural groundglass with interlobular and intralobular septal thickening and air trapping, not indicative of UIP but suggest alternative diagnosis such as chronic hypersensitivity pneumonitis or NSIP, soft tissue mass with coarse calcification in the prevascular space with adjacent mediastinal nodularity  07/16/2017-PET scan- 4.6 cm right sided anterior mediastinal mass is hypermetabolic and highly suspicious for thymic neoplasm, no mediastinal or hilar lymphadenopathy and no worrisome pulmonary lesions, left adrenal gland adenoma  08/07/2017- tissue flow cytometry-lambda predominant B-cell present 08/07/2017- lung wedge biopsy resection right middle lobe-mild subpleural inflammation changes are mild and suggestive of early mild interstitial pneumonialike pattern, differential diagnosis can include mild hypersensitivity pneumonitis, mild drug-induced lung injury and less likely collagen vascular disease  07/03/2017- connective tissue- lab work Sed rate-16 Hypersensitivity pneumonitis-negative Aldolase-4.6 CK total-negative Rheumatoid factor-14 ANA positive ENA RNP AB-1, mildly positive ACE-58  09/02/2018-CT chest high-res- previously seen bilateral bibasilar predominant groundglass opacities on CT dated on 07/09/2017 and PET/CT dated on 07/16/2017 are mostly resolved there is very minimal subpleural reticular groundglass opacity without clear gradient, findings remain in keeping with a very mild NSIP pattern, which was demonstrated by wedge biopsy histology.  May need to consider ongoing follow-up to assess for long-term stability of fibrotic findings, interval resection of the previously seen anterior mediastinal mass.  02/26/2018-pulmonary function test--spirometry with DLCO-FVC 2.55 (88% predicted), ratio 85, FEV1 2.16 (99% predicted), DLCO 18.35 (75% predicted)  07/25/2017-echocardiogram-LV ejection fraction 55 to 60%,  FENO:  No results  found for: NITRICOXIDE  PFT: PFT Results Latest Ref Rng & Units 10/08/2018 02/26/2018 07/11/2017  FVC-Pre L 2.74 2.55 2.14  FVC-Predicted Pre % 93 88 73  Pre FEV1/FVC % % 87 85 85  FEV1-Pre L 2.39 2.16 1.83  FEV1-Predicted Pre % 107 99 83  DLCO UNC% % 90 75 80  DLCO COR %Predicted % 111 97 108    Imaging: No results found.    Specialty Problems      Pulmonary Problems   SOB (shortness of breath)   ILD (interstitial lung disease) ([place])    07/09/2017-CT chest high-res- basilar predominant patchy and subpleural groundglass with interlobular and intralobular septal thickening and air trapping, not indicative of UIP but suggest alternative diagnosis such as chronic hypersensitivity pneumonitis or NSIP, soft tissue mass with coarse calcification in the prevascular space with adjacent mediastinal nodularity  07/16/2017-PET scan- 4.6 cm right sided anterior mediastinal mass is hypermetabolic and highly suspicious for thymic neoplasm, no mediastinal or hilar lymphadenopathy and no worrisome pulmonary lesions, left adrenal gland adenoma  08/07/2017- tissue flow cytometry-lambda predominant B-cell present 08/07/2017- lung wedge biopsy resection right middle lobe-mild subpleural inflammation changes are mild and suggestive of early mild interstitial pneumonialike pattern, differential diagnosis can include mild hypersensitivity pneumonitis, mild drug-induced lung injury and less likely collagen vascular disease  07/03/2017- connective tissue- lab work Sed rate-16 Hypersensitivity pneumonitis-negative Aldolase-4.6 CK total-negative Rheumatoid factor-14 ANA positive ENA RNP AB-1, mildly positive ACE-58  09/02/2018-CT chest high-res- previously seen bilateral bibasilar predominant groundglass opacities on CT dated on 07/09/2017 and PET/CT dated on 07/16/2017 are mostly resolved there is very minimal subpleural reticular groundglass opacity without clear gradient, findings remain in keeping with a very mild  NSIP pattern, which was demonstrated by wedge biopsy histology.  May need to consider ongoing follow-up to assess for long-term stability of fibrotic findings, interval resection of the previously seen anterior mediastinal mass.  02/26/2018-pulmonary function test--spirometry with DLCO-FVC 2.55 (88% predicted), ratio 85, FEV1 2.16 (99% predicted), DLCO 18.35 (75% predicted)      NSIP (nonspecific interstitial pneumonia) ([place])    08/07/2017- tissue flow cytometry-lambda predominant B-cell present 08/07/2017- lung wedge biopsy resection right middle lobe-mild subpleural inflammation changes are mild and suggestive of early mild interstitial pneumonialike pattern, differential diagnosis can include mild hypersensitivity pneumonitis, mild drug-induced lung injury and less likely collagen vascular disease  07/03/2017- connective tissue- lab work Sed rate-16 Hypersensitivity pneumonitis-negative Aldolase-4.6 CK total-negative Rheumatoid factor-14 ANA positive ENA RNP AB-1, mildly positive ACE-58  09/02/2018-CT chest high-res- previously seen bilateral bibasilar predominant groundglass opacities on CT dated on 07/09/2017 and PET/CT dated on 07/16/2017 are mostly resolved there is very minimal subpleural reticular groundglass opacity without clear gradient, findings remain in keeping with a very mild NSIP pattern, which was demonstrated by wedge biopsy histology.  May need to consider ongoing follow-up to assess for long-term stability of fibrotic findings, interval resection of the previously seen anterior mediastinal mass.  02/26/2018-pulmonary function test--spirometry with DLCO-FVC 2.55 (88% predicted), ratio 85, FEV1 2.16 (99% predicted), DLCO 18.35 (75% predicted)         Allergies  Allergen Reactions  . Carvedilol Palpitations and Other (See Comments)    Abdominal bloating  . Atorvastatin     fatigue  . Claritin [Loratadine] Other (See Comments)    Blurred vision and dry eyes  . Lisinopril  Other (See Comments)    "drunk feeling"    Immunization History  Administered Date(s) Administered  . Influenza, High Dose Seasonal PF 10/07/2016, 11/06/2017   High-dose flu vaccine today  Past Medical History:  Diagnosis Date  . Carpal tunnel syndrome of right wrist   . Diverticulosis   .  GERD (gastroesophageal reflux disease)   . H/O hiatal hernia   . H/O intraventricular hemorrhage 2004--  SPONTANEOUS   S/P SHUNT  . Heart murmur   . Hip pain, right OCCASIONAL -- PINCHED NERVE  . History of gastric ulcer 2001  . Hyperlipidemia   . Hypertension   . Hypoglycemia   . MVP (mitral valve prolapse)   . Normal echocardiogram 30 YRS AGO  . S/P ventriculoperitoneal shunt 2004--- PER PT SHUNT IS WORKING WELL --  NO ISSUES  . Squamous cell cancer of scalp and skin of neck    mole back of head    Tobacco History: Social History   Tobacco Use  Smoking Status Never Smoker  Smokeless Tobacco Never Used   Counseling given: Yes  Continue to not smoke  Outpatient Encounter Medications as of 10/08/2018  Medication Sig  . acetaminophen (TYLENOL) 500 MG tablet Take 1-2 tablets (500-1,000 mg total) by mouth 2 (two) times daily as needed for moderate pain or headache.  Marland Kitchen amLODipine (NORVASC) 5 MG tablet Take 5 mg by mouth daily.  Marland Kitchen buPROPion (WELLBUTRIN XL) 150 MG 24 hr tablet Take 150 mg by mouth daily.   Marland Kitchen esomeprazole (NEXIUM) 40 MG capsule Take 40 mg by mouth daily.   . hydrochlorothiazide (HYDRODIURIL) 50 MG tablet Take 50 mg by mouth daily.  Marland Kitchen ibuprofen (ADVIL,MOTRIN) 200 MG tablet Take 400-600 mg by mouth 2 (two) times daily as needed for headache or moderate pain.  . metoprolol succinate (TOPROL-XL) 100 MG 24 hr tablet Take 100 mg by mouth daily. Take with or immediately following a meal.  . Polyethylene Glycol 400 (BLINK TEARS OP) Place 1 drop into both eyes daily as needed (dry eyes).  . Polyvinyl Alcohol (LIQUID TEARS OP) Place 1 drop into both eyes daily as needed (dry eyes).   . pravastatin (PRAVACHOL) 80 MG tablet Take 1 tablet (80 mg total) by mouth daily.  . [DISCONTINUED] hydrochlorothiazide (HYDRODIURIL) 25 MG tablet Take 25 mg by mouth daily.   No facility-administered encounter medications on file as of 10/08/2018.      Review of Systems  Review of Systems  Constitutional: Negative for activity change, fatigue and fever.  HENT: Negative for sinus pressure, sinus pain and sore throat.   Respiratory: Negative for cough, shortness of breath and wheezing.   Cardiovascular: Negative for chest pain and palpitations.  Gastrointestinal: Positive for abdominal pain. Negative for diarrhea, nausea and vomiting.  Musculoskeletal: Negative for arthralgias.  Neurological: Negative for dizziness.  Psychiatric/Behavioral: Negative for sleep disturbance. The patient is not nervous/anxious.      Physical Exam  BP 122/64 (BP Location: Left Arm, Patient Position: Sitting, Cuff Size: Large)   Pulse 74   Temp 98.3 F (36.8 C) (Oral)   Ht 5' 4.5" (1.638 m)   Wt 220 lb (99.8 kg)   SpO2 96%   BMI 37.18 kg/m   Wt Readings from Last 5 Encounters:  10/08/18 220 lb (99.8 kg)  02/26/18 224 lb (101.6 kg)  11/26/17 224 lb 3.2 oz (101.7 kg)  09/12/17 219 lb (99.3 kg)  08/23/17 219 lb (99.3 kg)    Physical Exam Vitals signs and nursing note reviewed.  Constitutional:      General: She is not in acute distress.    Appearance: Normal appearance. She is obese.  HENT:     Head: Normocephalic and atraumatic.     Right Ear: Tympanic membrane, ear canal and external ear normal. There is no impacted cerumen.  Left Ear: Tympanic membrane, ear canal and external ear normal. There is no impacted cerumen.     Nose: Nose normal. No congestion.     Mouth/Throat:     Mouth: Mucous membranes are dry.     Pharynx: Oropharynx is clear.  Eyes:     Pupils: Pupils are equal, round, and reactive to light.  Neck:     Musculoskeletal: Normal range of motion.  Cardiovascular:      Rate and Rhythm: Normal rate and regular rhythm.     Pulses: Normal pulses.     Heart sounds: Normal heart sounds. No murmur.  Pulmonary:     Effort: Pulmonary effort is normal. No respiratory distress.     Breath sounds: Normal breath sounds. No decreased air movement. No decreased breath sounds, wheezing or rales.  Abdominal:     General: Abdomen is flat. Bowel sounds are normal. There is no distension.     Palpations: Abdomen is soft.  Skin:    General: Skin is warm and dry.     Capillary Refill: Capillary refill takes less than 2 seconds.  Neurological:     General: No focal deficit present.     Mental Status: She is alert and oriented to person, place, and time. Mental status is at baseline.     Gait: Gait normal.  Psychiatric:        Mood and Affect: Mood normal.        Behavior: Behavior normal.        Thought Content: Thought content normal.        Judgment: Judgment normal.      Lab Results:  CBC    Component Value Date/Time   WBC 10.2 08/09/2017 0445   RBC 3.87 08/09/2017 0445   HGB 12.1 08/09/2017 0445   HCT 38.4 08/09/2017 0445   PLT 199 08/09/2017 0445   MCV 99.2 08/09/2017 0445   MCH 31.3 08/09/2017 0445   MCHC 31.5 08/09/2017 0445   RDW 13.5 08/09/2017 0445    BMET    Component Value Date/Time   NA 141 08/09/2017 0445   K 4.1 08/09/2017 0445   CL 106 08/09/2017 0445   CO2 29 08/09/2017 0445   GLUCOSE 119 (H) 08/09/2017 0445   BUN 17 08/09/2017 0445   CREATININE 0.79 08/09/2017 0445   CALCIUM 9.5 08/09/2017 0445   GFRNONAA >60 08/09/2017 0445   GFRAA >60 08/09/2017 0445    BNP No results found for: BNP  ProBNP No results found for: PROBNP    Assessment & Plan:   Healthcare maintenance Plan: Flu vaccine today Patient needs Pneumovax 23 at next office visit  Right upper quadrant abdominal pain Concern over patient's right upper quadrant pain.  Suspect patient may be having early signs of gallbladder disease.  Recommended to the  patient today for her to follow-up with primary care sooner than November/2020 office visit.  Patient reports that she will contact primary care for an earlier appointment to discuss.  NSIP (nonspecific interstitial pneumonia) ([place]) Plan: We will follow clinically Continue to remain active Notify our office if you have any sort of new respiratory concerns or symptoms Follow-up with our office in 6 months  ILD (interstitial lung disease) ([place]) Plan: We will follow clinically Continue to remain active Notify our office if you have any sort of new respiratory concerns or symptoms Follow-up with our office in 6 months   Return in about 6 months (around 04/07/2019), or if symptoms worsen or fail to improve, for  Follow up with Dr. Purnell Shoemaker, Follow up with Wyn Quaker FNP-C.   Lauraine Rinne, NP 10/08/2018   This appointment was 28 minutes long with over 50% of the time in direct face-to-face patient care, assessment, plan of care, and follow-up.

## 2018-10-08 ENCOUNTER — Ambulatory Visit: Payer: Medicare HMO | Admitting: Pulmonary Disease

## 2018-10-08 ENCOUNTER — Other Ambulatory Visit: Payer: Self-pay

## 2018-10-08 ENCOUNTER — Ambulatory Visit (INDEPENDENT_AMBULATORY_CARE_PROVIDER_SITE_OTHER): Payer: Medicare HMO | Admitting: Internal Medicine

## 2018-10-08 ENCOUNTER — Encounter: Payer: Self-pay | Admitting: Pulmonary Disease

## 2018-10-08 VITALS — BP 122/64 | HR 74 | Temp 98.3°F | Ht 64.5 in | Wt 220.0 lb

## 2018-10-08 DIAGNOSIS — Z0000 Encounter for general adult medical examination without abnormal findings: Secondary | ICD-10-CM

## 2018-10-08 DIAGNOSIS — R1011 Right upper quadrant pain: Secondary | ICD-10-CM

## 2018-10-08 DIAGNOSIS — J849 Interstitial pulmonary disease, unspecified: Secondary | ICD-10-CM

## 2018-10-08 DIAGNOSIS — Z23 Encounter for immunization: Secondary | ICD-10-CM

## 2018-10-08 DIAGNOSIS — J8489 Other specified interstitial pulmonary diseases: Secondary | ICD-10-CM | POA: Diagnosis not present

## 2018-10-08 LAB — PULMONARY FUNCTION TEST
DL/VA % pred: 111 %
DL/VA: 4.59 ml/min/mmHg/L
DLCO unc % pred: 90 %
DLCO unc: 17.78 ml/min/mmHg
FEF 25-75 Pre: 2.94 L/sec
FEF2575-%Pred-Pre: 164 %
FEV1-%Pred-Pre: 107 %
FEV1-Pre: 2.39 L
FEV1FVC-%Pred-Pre: 115 %
FEV6-%Pred-Pre: 97 %
FEV6-Pre: 2.74 L
FEV6FVC-%Pred-Pre: 104 %
FVC-%Pred-Pre: 93 %
FVC-Pre: 2.74 L
Pre FEV1/FVC ratio: 87 %
Pre FEV6/FVC Ratio: 100 %

## 2018-10-08 NOTE — Assessment & Plan Note (Signed)
Plan: We will follow clinically Continue to remain active Notify our office if you have any sort of new respiratory concerns or symptoms Follow-up with our office in 6 months

## 2018-10-08 NOTE — Assessment & Plan Note (Signed)
Concern over patient's right upper quadrant pain.  Suspect patient may be having early signs of gallbladder disease.  Recommended to the patient today for her to follow-up with primary care sooner than November/2020 office visit.  Patient reports that she will contact primary care for an earlier appointment to discuss.

## 2018-10-08 NOTE — Progress Notes (Signed)
Spiro/DLCO performed today. 

## 2018-10-08 NOTE — Assessment & Plan Note (Signed)
Plan: Flu vaccine today Patient needs Pneumovax 23 at next office visit

## 2018-10-08 NOTE — Patient Instructions (Addendum)
You were seen today by Lauraine Rinne, NP  for:   1. NSIP (nonspecific interstitial pneumonia) ([place])  CT results have improved as of July/2020  Pulmonary function test today stable, this is good news  Follow-up with our office in 6 months  2. ILD (interstitial lung disease) ([place])  Improved imaging and breathing test.  This is good news.  Follow-up with our office in 6 months.  3. Healthcare maintenance  High-dose flu vaccine today  4. Right upper quadrant abdominal pain  I am concerned about your right upper quadrant abdominal pain especially since it occurs after eating heavier meals.  I recommend you follow-up with primary care.  You may need to be further evaluated based off of this concern.   Follow Up:    Return in about 6 months (around 04/07/2019), or if symptoms worsen or fail to improve, for Follow up with Dr. Purnell Shoemaker, Follow up with Wyn Quaker FNP-C.   Please do your part to reduce the spread of COVID-19:      Reduce your risk of any infection  and COVID19 by using the similar precautions used for avoiding the common cold or flu:  Marland Kitchen Wash your hands often with soap and warm water for at least 20 seconds.  If soap and water are not readily available, use an alcohol-based hand sanitizer with at least 60% alcohol.  . If coughing or sneezing, cover your mouth and nose by coughing or sneezing into the elbow areas of your shirt or coat, into a tissue or into your sleeve (not your hands). Langley Gauss A MASK when in public  . Avoid shaking hands with others and consider head nods or verbal greetings only. . Avoid touching your eyes, nose, or mouth with unwashed hands.  . Avoid close contact with people who are sick. . Avoid places or events with large numbers of people in one location, like concerts or sporting events. . If you have some symptoms but not all symptoms, continue to monitor at home and seek medical attention if your symptoms worsen. . If you are having a medical  emergency, call 911.   [place] / e-Visit: [email or web address]         [facility]: [facility]: [id number]                   [facility]: [id number]     It is flu season:   >>> Best ways to protect herself from the flu: Receive the yearly flu vaccine, practice good hand hygiene washing with soap and also using hand sanitizer when available, eat a nutritious meals, get adequate rest, hydrate appropriately   Please contact the office if your symptoms worsen or you have concerns that you are not improving.   Thank you for choosing [facility] for your healthcare, and for allowing [place] to partner with you on your healthcare journey. I am thankful to be able to provide care to you today.   Wyn Quaker FNP-C   Influenza Virus Vaccine injection What is this medicine? INFLUENZA VIRUS VACCINE (in floo EN zuh VAHY ruhs vak SEEN) helps to reduce the risk of getting influenza also known as the flu. The vaccine only helps protect you against some strains of the flu. This medicine may be used for other purposes; ask your health care provider or pharmacist if you have questions. COMMON BRAND NAME(S): Afluria, Afluria Quadrivalent, Agriflu, Alfuria,  FLUAD, Fluarix, Fluarix Quadrivalent, Flublok, Flublok Quadrivalent, FLUCELVAX, Flulaval, Fluvirin, Fluzone, Fluzone High-Dose, Fluzone Intradermal What should I tell my health care provider before I take this medicine? They need to know if you have any of these conditions:  bleeding disorder like hemophilia  fever or infection  Guillain-Barre syndrome or other neurological problems  immune system problems  infection with the human immunodeficiency virus (HIV) or AIDS  low blood platelet counts  multiple sclerosis  an unusual or allergic reaction to influenza virus vaccine,  latex, other medicines, foods, dyes, or preservatives. Different brands of vaccines contain different allergens. Some may contain latex or eggs. Talk to your doctor about your allergies to make sure that you get the right vaccine.  pregnant or trying to get pregnant  breast-feeding How should I use this medicine? This vaccine is for injection into a muscle or under the skin. It is given by a health care professional. A copy of Vaccine Information Statements will be given before each vaccination. Read this sheet carefully each time. The sheet may change frequently. Talk to your healthcare provider to see which vaccines are right for you. Some vaccines should not be used in all age groups. Overdosage: If you think you have taken too much of this medicine contact a poison control center or emergency room at once. NOTE: This medicine is only for you. Do not share this medicine with others. What if I miss a dose? This does not apply. What may interact with this medicine?  chemotherapy or radiation therapy  medicines that lower your immune system like etanercept, anakinra, infliximab, and adalimumab  medicines that treat or prevent blood clots like warfarin  phenytoin  steroid medicines like prednisone or cortisone  theophylline  vaccines This list may not describe all possible interactions. Give your health care provider a list of all the medicines, herbs, non-prescription drugs, or dietary supplements you use. Also tell them if you smoke, drink alcohol, or use illegal drugs. Some items may interact with your medicine. What should I watch for while using this medicine? Report any side effects that do not go away within 3 days to your doctor or health care professional. Call your health care provider if any unusual symptoms occur within 6 weeks of receiving this vaccine. You may still catch the flu, but the illness is not usually as bad. You cannot get the flu from the vaccine. The vaccine  will not protect against colds or other illnesses that may cause fever. The vaccine is needed every year. What side effects may I notice from receiving this medicine? Side effects that you should report to your doctor or health care professional as soon as possible:  allergic reactions like skin rash, itching or hives, swelling of the face, lips, or tongue Side effects that usually do not require medical attention (report to your doctor or health care professional if they continue or are bothersome):  fever  headache  muscle aches and pains  pain, tenderness, redness, or swelling at the injection site  tiredness This list may not describe all possible side effects. Call your doctor for medical advice about side effects. You may report side effects to FDA at 1-800-FDA-1088. Where should I keep my medicine? The vaccine will be given by a health care professional in a clinic, pharmacy, doctor's office, or other health care setting. You will not be given vaccine doses to store at home. NOTE: This sheet is a summary. It may not cover all possible information. If you have  questions about this medicine, talk to your doctor, pharmacist, or health care provider.  2020 Elsevier/Gold Standard (2017-12-18 08:45:43)

## 2018-10-16 DIAGNOSIS — I700 Atherosclerosis of aorta: Secondary | ICD-10-CM | POA: Diagnosis not present

## 2018-10-16 DIAGNOSIS — R079 Chest pain, unspecified: Secondary | ICD-10-CM | POA: Diagnosis not present

## 2018-10-16 DIAGNOSIS — R1011 Right upper quadrant pain: Secondary | ICD-10-CM | POA: Diagnosis not present

## 2018-10-21 ENCOUNTER — Other Ambulatory Visit: Payer: Self-pay | Admitting: Family Medicine

## 2018-10-21 DIAGNOSIS — R1011 Right upper quadrant pain: Secondary | ICD-10-CM

## 2018-10-25 ENCOUNTER — Ambulatory Visit
Admission: RE | Admit: 2018-10-25 | Discharge: 2018-10-25 | Disposition: A | Discharge status: Home / Self Care | Payer: Medicare HMO | Source: Ambulatory Visit | Attending: Family Medicine | Admitting: Family Medicine

## 2018-10-25 DIAGNOSIS — R1011 Right upper quadrant pain: Secondary | ICD-10-CM

## 2018-10-25 DIAGNOSIS — K760 Fatty (change of) liver, not elsewhere classified: Secondary | ICD-10-CM | POA: Diagnosis not present

## 2018-11-07 ENCOUNTER — Other Ambulatory Visit: Payer: Self-pay | Admitting: Family Medicine

## 2018-11-07 DIAGNOSIS — R1011 Right upper quadrant pain: Secondary | ICD-10-CM

## 2018-11-12 DIAGNOSIS — Z803 Family history of malignant neoplasm of breast: Secondary | ICD-10-CM | POA: Diagnosis not present

## 2018-11-12 DIAGNOSIS — Z1231 Encounter for screening mammogram for malignant neoplasm of breast: Secondary | ICD-10-CM | POA: Diagnosis not present

## 2018-11-20 ENCOUNTER — Ambulatory Visit (HOSPITAL_COMMUNITY)
Admission: RE | Admit: 2018-11-20 | Discharge: 2018-11-20 | Disposition: A | Discharge status: Home / Self Care | Payer: Medicare HMO | Source: Ambulatory Visit | Attending: Family Medicine | Admitting: Family Medicine

## 2018-11-20 ENCOUNTER — Other Ambulatory Visit: Payer: Self-pay

## 2018-11-20 DIAGNOSIS — R1010 Upper abdominal pain, unspecified: Secondary | ICD-10-CM | POA: Diagnosis not present

## 2018-11-20 DIAGNOSIS — R1011 Right upper quadrant pain: Secondary | ICD-10-CM | POA: Diagnosis not present

## 2018-11-20 MED ORDER — TECHNETIUM TC 99M MEBROFENIN IV KIT
5.2100 | PACK | Freq: Once | INTRAVENOUS | Status: AC | PRN
  Administered 2018-11-20: 5.21 via INTRAVENOUS

## 2018-11-29 ENCOUNTER — Encounter: Payer: Self-pay | Admitting: Physician Assistant

## 2018-12-11 ENCOUNTER — Ambulatory Visit: Payer: Medicare HMO | Admitting: Physician Assistant

## 2018-12-11 ENCOUNTER — Encounter: Payer: Self-pay | Admitting: Physician Assistant

## 2018-12-11 VITALS — BP 128/70 | HR 73 | Temp 97.0°F | Ht 64.0 in | Wt 218.4 lb

## 2018-12-11 DIAGNOSIS — K219 Gastro-esophageal reflux disease without esophagitis: Secondary | ICD-10-CM

## 2018-12-11 DIAGNOSIS — Z1159 Encounter for screening for other viral diseases: Secondary | ICD-10-CM | POA: Diagnosis not present

## 2018-12-11 DIAGNOSIS — R1013 Epigastric pain: Secondary | ICD-10-CM

## 2018-12-11 MED ORDER — ESOMEPRAZOLE MAGNESIUM 40 MG PO CPDR: 40.0000 mg | DELAYED_RELEASE_CAPSULE | Freq: Two times a day (BID) | ORAL | 3 refills | Status: DC

## 2018-12-11 NOTE — Patient Instructions (Signed)
We have sent the following medications to your pharmacy for you to pick up at your convenience:  Nexium 40 mg twice a day  Your provider has requested that you go to the basement level for lab work before leaving today. Press "B" on the elevator. The lab is located at the first door on the left as you exit the elevator.  You have been scheduled for an endoscopy. Please follow written instructions given to you at your visit today. If you use inhalers (even only as needed), please bring them with you on the day of your procedure.

## 2018-12-11 NOTE — Progress Notes (Signed)
Chief Complaint: Abdominal pain, GERD  HPI:    Alison Ayala is a 73 year old Caucasian female known to Dr. Hilarie Fredrickson, who was referred to me by Harlan Stains, MD for a complaint of abdominal pain, GERD.      11/11/2015 colonoscopy for family history of colorectal cancer in a first-degree relative with one 5 mm polyp at the ileocecal valve, one 7 mm polyp in the transverse colon, 2 3-5 mm polyps in the sigmoid and descending colon, 1 2 mm polyp in the rectum, mild diverticulosis in the sigmoid colon, internal hemorrhoids.  Pathology was a mix of adenomatous and hyperplastic polyps.  Repeat recommended in 5 years.  EGD on the same day for gastroesophageal reflux disease and a personal history of malignant gastric neoplasm with a normal esophagus, 2 cm hiatal hernia, mild gastritis.  Pathology showed chronic gastritis.    Today, the patient presents to clinic and explains that in the spring she started with epigastric pain which felt like she had a "fist in my chest".  She then started noticing that she had some shortness of breath when she was walking into stores, etc. and weakness.  She started to realize that this was always after she had eaten.  If she did anything on an empty stomach she was fine but if she ate and tried to do anything within the next 15 minutes to hour or 2 after, she felt weak.  Tells me that now about 75 to 80% of the time after eating she gets uncomfortable in her epigastrium and bloated.  Has also had some breakthrough reflux symptoms regardless of her Nexium 40 mg daily.      Tells me she was just seen by her pulmonologist who cleared her and said that her lung problems were way better than they had ever been and they did not need to see her for a year.    Family history of stomach cancer in her aunt.    Denies fever, chills, weight loss, nausea, vomiting or symptoms that awaken her from sleep.  Past Medical History:  Diagnosis Date  . Carpal tunnel syndrome of right wrist   .  Diverticulosis   . GERD (gastroesophageal reflux disease)   . H/O hiatal hernia   . H/O intraventricular hemorrhage 2004--  SPONTANEOUS   S/P SHUNT  . Heart murmur   . Hip pain, right OCCASIONAL -- PINCHED NERVE  . History of gastric ulcer 2001  . Hyperlipidemia   . Hypertension   . Hypoglycemia   . MVP (mitral valve prolapse)   . Normal echocardiogram 30 YRS AGO  . S/P ventriculoperitoneal shunt 2004--- PER PT SHUNT IS WORKING WELL --  NO ISSUES  . Squamous cell cancer of scalp and skin of neck    mole back of head    Past Surgical History:  Procedure Laterality Date  . BILATERAL FRONTAL INTERVENTRICULAR CATHETER VIA TWIST DRILL HOLE  AUG 2004   SPONTANEOUS SUBARACHNOID  HEMORRHAGE AND  INTRAVENTRICULAR HEMORRHAGE  . CARPAL TUNNEL RELEASE  07/26/2011   Procedure: CARPAL TUNNEL RELEASE;  Surgeon: Magnus Sinning, MD;  Location: [facility];  Service: Orthopedics;  Laterality: Right;  . CATARACT EXTRACTION W/ INTRAOCULAR LENS  IMPLANT, BILATERAL  2012   BILATERAL  . DUPUYTREN CONTRACTURE RELEASE  07/26/2011   Procedure: DUPUYTREN CONTRACTURE RELEASE;  Surgeon: Magnus Sinning, MD;  Location: [facility];  Service: Orthopedics;  Laterality: Right;  EXCISION OF DUPYTRENS BAND THUMB- INDEX WEB SPACE  . LUNG  BIOPSY Right 08/07/2017   Procedure: Open LUNG BIOPSY;  Surgeon: Ivin Poot, MD;  Location: [facility];  Service: Thoracic;  Laterality: Right;  . RESECTION OF MEDIASTINAL MASS N/A 08/07/2017   Procedure: RESECTION OF MEDIASTINAL MASS;  Surgeon: Ivin Poot, MD;  Location: [facility];  Service: Thoracic;  Laterality: N/A;  . RETINAL DETACHMENT REPAIR W/ SCLERAL BUCKLE LE  03-10-2011   LEFT EYE  . STERNOTOMY N/A 08/07/2017   Procedure: STERNOTOMY;  Surgeon: Prescott Gum, Collier Salina, MD;  Location: [facility];  Service: Thoracic;  Laterality: N/A;  . VENTRICULOPERITONEAL SHUNT  10-08-2002   RIGHT FRONTAL FOR COMMUNICATING HYDROCEPHALUS    Current Outpatient  Medications  Medication Sig Dispense Refill  . acetaminophen (TYLENOL) 500 MG tablet Take 1-2 tablets (500-1,000 mg total) by mouth 2 (two) times daily as needed for moderate pain or headache. 30 tablet 0  . amLODipine (NORVASC) 5 MG tablet Take 5 mg by mouth daily.    Marland Kitchen buPROPion (WELLBUTRIN XL) 150 MG 24 hr tablet Take 150 mg by mouth daily.     Marland Kitchen esomeprazole (NEXIUM) 40 MG capsule Take 40 mg by mouth daily.     . hydrochlorothiazide (HYDRODIURIL) 50 MG tablet Take 50 mg by mouth daily.    Marland Kitchen ibuprofen (ADVIL,MOTRIN) 200 MG tablet Take 400-600 mg by mouth 2 (two) times daily as needed for headache or moderate pain.    . metoprolol succinate (TOPROL-XL) 100 MG 24 hr tablet Take 100 mg by mouth daily. Take with or immediately following a meal.    . Polyethylene Glycol 400 (BLINK TEARS OP) Place 1 drop into both eyes daily as needed (dry eyes).    . Polyvinyl Alcohol (LIQUID TEARS OP) Place 1 drop into both eyes daily as needed (dry eyes).    . pravastatin (PRAVACHOL) 80 MG tablet Take 1 tablet (80 mg total) by mouth daily.     No current facility-administered medications for this visit.     Allergies as of 12/11/2018 - Review Complete 10/08/2018  Allergen Reaction Noted  . Carvedilol Palpitations and Other (See Comments) 07/03/2017  . Atorvastatin  07/03/2017  . Claritin [loratadine] Other (See Comments) 07/03/2017  . Lisinopril Other (See Comments) 07/03/2017    Family History  Problem Relation Age of Onset  . Colon cancer Mother 74  . Stomach cancer Maternal Aunt   . Breast cancer Maternal Aunt   . Uterine cancer Maternal Aunt     Social History   Socioeconomic History  . Marital status: Married    Spouse name: Not on file  . Number of children: Not on file  . Years of education: Not on file  . Highest education level: Not on file  Occupational History  . Not on file  Social Needs  . Financial resource strain: Not on file  . Food insecurity    Worry: Not on file     Inability: Not on file  . Transportation needs    Medical: Not on file    Non-medical: Not on file  Tobacco Use  . Smoking status: Never Smoker  . Smokeless tobacco: Never Used  Substance and Sexual Activity  . Alcohol use: Yes    Alcohol/week: 2.0 standard drinks    Types: 2 Glasses of wine per week    Comment: OCCASIONAL  . Drug use: No  . Sexual activity: Not on file  Lifestyle  . Physical activity    Days per week: Not on file    Minutes per session: Not on  file  . Stress: Not on file  Relationships  . Social Herbalist on phone: Not on file    Gets together: Not on file    Attends religious service: Not on file    Active member of club or organization: Not on file    Attends meetings of clubs or organizations: Not on file    Relationship status: Not on file  . Intimate partner violence    Fear of current or ex partner: Not on file    Emotionally abused: Not on file    Physically abused: Not on file    Forced sexual activity: Not on file  Other Topics Concern  . Not on file  Social History Narrative  . Not on file    Review of Systems:    Constitutional: No weight loss, fever or chills Skin: No rash  Cardiovascular: No chest pain Respiratory: +DOE Gastrointestinal: See HPI and otherwise negative Genitourinary: No dysuria  Neurological: No headache, dizziness or syncope Musculoskeletal: No new muscle or joint pain Hematologic: No bleeding Psychiatric: No history of depression or anxiety   Physical Exam:  Vital signs: BP 128/70   Pulse 73   Temp (!) 97 F (36.1 C)   Ht 5\' 4"  (1.626 m)   Wt 218 lb 6.4 oz (99.1 kg)   SpO2 98%   BMI 37.49 kg/m   Constitutional:   Pleasant obese Caucasian female appears to be in NAD, Well developed, Well nourished, alert and cooperative Head:  Normocephalic and atraumatic. Eyes:   PEERL, EOMI. No icterus. Conjunctiva pink. Ears:  Normal auditory acuity. Neck:  Supple Throat: Oral cavity and pharynx without  inflammation, swelling or lesion.  Respiratory: Respirations even and unlabored. Lungs clear to auscultation bilaterally.   No wheezes, crackles, or rhonchi.  Cardiovascular: Normal S1, S2. No MRG. Regular rate and rhythm. No peripheral edema, cyanosis or pallor.  Gastrointestinal:  Soft, nondistended, mild epigastric ttp, No rebound or guarding. Normal bowel sounds. No appreciable masses or hepatomegaly. Rectal:  Not performed.  Msk:  Symmetrical without gross deformities. Without edema, no deformity or joint abnormality.  Neurologic:  Alert and  oriented x4;  grossly normal neurologically.  Skin:   Dry and intact without significant lesions or rashes. Psychiatric: Demonstrates good judgement and reason without abnormal affect or behaviors.  No recent labs or imaging.  Assessment: 1. Epigastric pain: 15 minutes after eating, also with increase in reflux; consider gastritis +/- PUD 2. GERD: See above 3. DOE/weakness: After eating, uncertain etiology, cleared by pulmonology  Plan: 1.  Scheduled patient for an EGD given her increase in reflux and epigastric pain over the past few months and family history of stomach cancer.  Did discuss risks, benefits, limitations and alternatives and the patient agrees to proceed.  This was scheduled with Dr. Hilarie Fredrickson in the [facility]. 2.  Increase Nexium to 40 mg twice daily, 30 to 60 minutes before breakfast and dinner.  Patient tells me she is enough of this medication at home. 3.  Discussed with the patient that if EGD is unrevealing could consider a CTA given weakness after eating. 4.  Ordered labs to include a CBC, CMP and lipase. 5.  Patient to follow in clinic per recommendations from Dr. Hilarie Fredrickson after time of procedure.  Ellouise Newer, PA-C [facility] 12/11/2018, 9:57 AM  Cc: Harlan Stains, MD

## 2018-12-11 NOTE — Progress Notes (Signed)
Addendum: Reviewed and agree with assessment and management plan. Pyrtle, Jay M, MD  

## 2018-12-13 ENCOUNTER — Encounter: Payer: Self-pay | Admitting: Internal Medicine

## 2018-12-13 ENCOUNTER — Other Ambulatory Visit (INDEPENDENT_AMBULATORY_CARE_PROVIDER_SITE_OTHER): Payer: Medicare HMO

## 2018-12-13 DIAGNOSIS — Z1159 Encounter for screening for other viral diseases: Secondary | ICD-10-CM

## 2018-12-13 DIAGNOSIS — K219 Gastro-esophageal reflux disease without esophagitis: Secondary | ICD-10-CM

## 2018-12-13 DIAGNOSIS — R1013 Epigastric pain: Secondary | ICD-10-CM

## 2018-12-13 LAB — COMPREHENSIVE METABOLIC PANEL
ALT: 9 U/L (ref 0–35)
AST: 14 U/L (ref 0–37)
Albumin: 4.6 g/dL (ref 3.5–5.2)
Alkaline Phosphatase: 74 U/L (ref 39–117)
BUN: 15 mg/dL (ref 6–23)
CO2: 29 mEq/L (ref 19–32)
Calcium: 10.1 mg/dL (ref 8.4–10.5)
Chloride: 103 mEq/L (ref 96–112)
Creatinine, Ser: 0.81 mg/dL (ref 0.40–1.20)
GFR: 69.16 mL/min (ref >60.00)
Glucose, Bld: 126 mg/dL — ABNORMAL HIGH (ref 70–99)
Potassium: 3.6 mEq/L (ref 3.5–5.1)
Sodium: 141 mEq/L (ref 135–145)
Total Bilirubin: 0.6 mg/dL (ref 0.2–1.2)
Total Protein: 7 g/dL (ref 6.0–8.3)

## 2018-12-13 LAB — CBC
HCT: 42.3 % (ref 36.0–46.0)
Hemoglobin: 14.2 g/dL (ref 12.0–15.0)
MCHC: 33.6 g/dL (ref 30.0–36.0)
MCV: 93.7 fl (ref 78.0–100.0)
Platelets: 237 10*3/uL (ref 150.0–400.0)
RBC: 4.52 Mil/uL (ref 3.87–5.11)
RDW: 13.9 % (ref 11.5–15.5)
WBC: 5.4 10*3/uL (ref 4.0–10.5)

## 2018-12-13 LAB — LIPASE: Lipase: 16 U/L (ref 11.0–59.0)

## 2018-12-16 ENCOUNTER — Telehealth: Payer: Self-pay

## 2018-12-16 LAB — SARS CORONAVIRUS 2 (TAT 6-24 HRS): SARS Coronavirus 2: NEGATIVE

## 2018-12-16 NOTE — Telephone Encounter (Signed)
See result note phone call 

## 2018-12-16 NOTE — Telephone Encounter (Signed)
Pt called returning your call 

## 2018-12-16 NOTE — Telephone Encounter (Signed)
Left message for patient to please call back. 

## 2018-12-17 ENCOUNTER — Other Ambulatory Visit: Payer: Self-pay

## 2018-12-17 ENCOUNTER — Encounter: Payer: Self-pay | Admitting: Internal Medicine

## 2018-12-17 ENCOUNTER — Ambulatory Visit (AMBULATORY_SURGERY_CENTER): Payer: Medicare HMO | Admitting: Internal Medicine

## 2018-12-17 VITALS — BP 143/70 | HR 62 | Temp 98.2°F | Resp 22 | Ht 64.0 in | Wt 218.0 lb

## 2018-12-17 DIAGNOSIS — K2950 Unspecified chronic gastritis without bleeding: Secondary | ICD-10-CM | POA: Diagnosis not present

## 2018-12-17 DIAGNOSIS — K449 Diaphragmatic hernia without obstruction or gangrene: Secondary | ICD-10-CM | POA: Diagnosis not present

## 2018-12-17 DIAGNOSIS — K219 Gastro-esophageal reflux disease without esophagitis: Secondary | ICD-10-CM | POA: Diagnosis not present

## 2018-12-17 DIAGNOSIS — I10 Essential (primary) hypertension: Secondary | ICD-10-CM | POA: Diagnosis not present

## 2018-12-17 DIAGNOSIS — R1013 Epigastric pain: Secondary | ICD-10-CM | POA: Diagnosis not present

## 2018-12-17 MED ORDER — SUCRALFATE 1 G PO TABS: 1.0000 g | ORAL_TABLET | Freq: Three times a day (TID) | ORAL | 1 refills | Status: DC

## 2018-12-17 MED ORDER — SODIUM CHLORIDE 0.9 % IV SOLN: 500.0000 mL | Freq: Once | INTRAVENOUS | Status: DC

## 2018-12-17 NOTE — Progress Notes (Signed)
Called to room to assist during endoscopic procedure.  Patient ID and intended procedure confirmed with present staff. Received instructions for my participation in the procedure from the performing physician.  

## 2018-12-17 NOTE — Progress Notes (Signed)
Report to PACU, RN, vss, BBS= Clear.  

## 2018-12-17 NOTE — Patient Instructions (Signed)
Please read handouts provided. Continue present medications. Await pathology results. Trial of sucralfate slurry 1 g before meals and at bedtime. Office follow-up if symptoms not improved.      YOU HAD AN ENDOSCOPIC PROCEDURE TODAY AT [facility]:   Refer to the procedure report that was given to you for any specific questions about what was found during the examination.  If the procedure report does not answer your questions, please call your gastroenterologist to clarify.  If you requested that your care partner not be given the details of your procedure findings, then the procedure report has been included in a sealed envelope for you to review at your convenience later.  YOU SHOULD EXPECT: Some feelings of bloating in the abdomen. Passage of more gas than usual.  Walking can help get rid of the air that was put into your GI tract during the procedure and reduce the bloating. If you had a lower endoscopy (such as a colonoscopy or flexible sigmoidoscopy) you may notice spotting of blood in your stool or on the toilet paper. If you underwent a bowel prep for your procedure, you may not have a normal bowel movement for a few days.  Please Note:  You might notice some irritation and congestion in your nose or some drainage.  This is from the oxygen used during your procedure.  There is no need for concern and it should clear up in a day or so.  SYMPTOMS TO REPORT IMMEDIATELY:   Following upper endoscopy (EGD)  Vomiting of blood or coffee ground material  New chest pain or pain under the shoulder blades  Painful or persistently difficult swallowing  New shortness of breath  Fever of 100F or higher  Black, tarry-looking stools  For urgent or emergent issues, a gastroenterologist can be reached at any hour by calling [phone].   DIET:  We do recommend a small meal at first, but then you may proceed to your regular diet.  Drink plenty of fluids but you should avoid  alcoholic beverages for 24 hours.  ACTIVITY:  You should plan to take it easy for the rest of today and you should NOT DRIVE or use heavy machinery until tomorrow (because of the sedation medicines used during the test).    FOLLOW UP: Our staff will call the number listed on your records 48-72 hours following your procedure to check on you and address any questions or concerns that you may have regarding the information given to you following your procedure. If we do not reach you, we will leave a message.  We will attempt to reach you two times.  During this call, we will ask if you have developed any symptoms of COVID 19. If you develop any symptoms (ie: fever, flu-like symptoms, shortness of breath, cough etc.) before then, please call [phone].  If you test positive for Covid 19 in the 2 weeks post procedure, please call and report this information to [place].    If any biopsies were taken you will be contacted by phone or by letter within the next 1-3 weeks.  Please call us at [phone] if you have not heard about the biopsies in 3 weeks.    SIGNATURES/CONFIDENTIALITY: You and/or your care partner have signed paperwork which will be entered into your electronic medical record.  These signatures attest to the fact that that the information above on your After Visit Summary has been reviewed and is understood.  Full responsibility of the confidentiality of  this discharge information lies with you and/or your care-partner.

## 2018-12-17 NOTE — Progress Notes (Signed)
Called patient at home number to let her know she left her glasses in recovery cubicle, no answer, left message on phone machine. Glasses brought to front desk, glasses in emesis basin with patient's information on side. B.Kaegan Hettich RN.

## 2018-12-17 NOTE — Op Note (Signed)
[place] Patient Name: Alison Ayala Procedure Date: 12/17/2018 8:38 AM MRN: [id number] Endoscopist: Jerene Bears , MD Age: 73 Referring MD:  Date of Birth: January 23, 1946 Gender: Female Account #: [id number] Procedure:                Upper GI endoscopy Indications:              Epigastric abdominal pain, substernal chest pain,                            Gastro-esophageal reflux disease Medicines:                Monitored Anesthesia Care Procedure:                Pre-Anesthesia Assessment:                           - Prior to the procedure, a History and Physical                            was performed, and patient medications and                            allergies were reviewed. The patient's tolerance of                            previous anesthesia was also reviewed. The risks                            and benefits of the procedure and the sedation                            options and risks were discussed with the patient.                            All questions were answered, and informed consent                            was obtained. Prior Anticoagulants: The patient has                            taken no previous anticoagulant or antiplatelet                            agents. ASA Grade Assessment: II - A patient with                            mild systemic disease. After reviewing the risks                            and benefits, the patient was deemed in                            satisfactory condition to undergo the procedure.  After obtaining informed consent, the endoscope was                            passed under direct vision. Throughout the                            procedure, the patient's blood pressure, pulse, and                            oxygen saturations were monitored continuously. The                            Endoscope was introduced through the mouth, and                            advanced to the second  part of duodenum. Scope In: Scope Out: Findings:                 Normal mucosa was found in the entire esophagus.                            Biopsies were taken with a cold forceps for                            histology.                           A 2 cm hiatal hernia was present.                           The gastroesophageal flap valve was visualized                            endoscopically and classified as Hill Grade IV (no                            fold, wide open lumen, hiatal hernia present).                           Diffuse mild inflammation characterized by                            granularity was found in the entire examined                            stomach. Biopsies were taken with a cold forceps                            for histology and Helicobacter pylori testing.                           The examined duodenum was normal. Complications:            No immediate complications. Estimated Blood Loss:     Estimated blood loss was minimal. Impression:               -  Normal mucosa was found in the entire esophagus.                            Biopsied.                           - 2 cm hiatal hernia.                           - Gastroesophageal flap valve classified as Hill                            Grade IV (no fold, wide open lumen, hiatal hernia                            present).                           - Gastritis. Biopsied.                           - Normal examined duodenum. Recommendation:           - Patient has a contact number available for                            emergencies. The signs and symptoms of potential                            delayed complications were discussed with the                            patient. Return to normal activities tomorrow.                            Written discharge instructions were provided to the                            patient.                           - Continue present medications.                            - Trial of sucralfate slurry 1 g before meals and                            at bedtime.                           - Await pathology results.                           - Query symptomatic hiatal hernia (though await                            pathology results). HIDA recently normal.                           -  Office follow-up if symptoms not improved. Jerene Bears, MD 12/17/2018 9:29:07 AM This report has been signed electronically.

## 2018-12-18 DIAGNOSIS — E785 Hyperlipidemia, unspecified: Secondary | ICD-10-CM | POA: Diagnosis not present

## 2018-12-18 DIAGNOSIS — J849 Interstitial pulmonary disease, unspecified: Secondary | ICD-10-CM | POA: Diagnosis not present

## 2018-12-18 DIAGNOSIS — Z0000 Encounter for general adult medical examination without abnormal findings: Secondary | ICD-10-CM | POA: Diagnosis not present

## 2018-12-18 DIAGNOSIS — I10 Essential (primary) hypertension: Secondary | ICD-10-CM | POA: Diagnosis not present

## 2018-12-18 DIAGNOSIS — F325 Major depressive disorder, single episode, in full remission: Secondary | ICD-10-CM | POA: Diagnosis not present

## 2018-12-18 DIAGNOSIS — R7301 Impaired fasting glucose: Secondary | ICD-10-CM | POA: Diagnosis not present

## 2018-12-18 DIAGNOSIS — E559 Vitamin D deficiency, unspecified: Secondary | ICD-10-CM | POA: Diagnosis not present

## 2018-12-18 DIAGNOSIS — K219 Gastro-esophageal reflux disease without esophagitis: Secondary | ICD-10-CM | POA: Diagnosis not present

## 2018-12-19 ENCOUNTER — Telehealth: Payer: Self-pay

## 2018-12-19 NOTE — Telephone Encounter (Signed)
  Follow up Call-  Call back number 12/17/2018  Post procedure Call Back phone  # [phone]  Permission to leave phone message Yes  Some recent data might be hidden     Patient questions:  Do you have a fever, pain , or abdominal swelling? No. Pain Score  0 *  Have you tolerated food without any problems? Yes.    Have you been able to return to your normal activities? Yes.    Do you have any questions about your discharge instructions: Diet   No. Medications  No. Follow up visit  No.  Do you have questions or concerns about your Care? No.  Actions: * If pain score is 4 or above: No action needed, pain <4.  1. Have you developed a fever since your procedure? no  2.   Have you had an respiratory symptoms (SOB or cough) since your procedure? no  3.   Have you tested positive for COVID 19 since your procedure no 4.   Have you had any family members/close contacts diagnosed with the COVID 19 since your procedure?  no  If yes to any of these questions please route to Joylene John, RN and Alphonsa Gin, Therapist, sports.

## 2018-12-23 ENCOUNTER — Encounter: Payer: Self-pay | Admitting: Internal Medicine

## 2018-12-27 ENCOUNTER — Telehealth: Payer: Self-pay | Admitting: Internal Medicine

## 2018-12-27 NOTE — Telephone Encounter (Signed)
Spoke with pt and reviewed results letter with pt. She states she is still having pain every time she eats, she has started feeling nauseated this past week, the medication has not made any change in her symptoms. Pt states she is miserable and wants to know what else can be done. Please advise.

## 2018-12-27 NOTE — Telephone Encounter (Signed)
Ok, sorry to hear she feels unwell Stop the carafate Continue the BID Nexium zofran odt 4 mg every 8 hrs PRN nausea  She needs a followup office visit to best determine how to proceed

## 2018-12-30 MED ORDER — ONDANSETRON 4 MG PO TBDP: 4.0000 mg | ORAL_TABLET | Freq: Three times a day (TID) | ORAL | 1 refills | Status: DC | PRN

## 2018-12-30 NOTE — Telephone Encounter (Signed)
Spoke with pt and she is aware. Script sent to pharmacy. Pt scheduled to see Dr. Hilarie Fredrickson 12/3@4pm . Pt aware of appt.

## 2018-12-31 DIAGNOSIS — Z79899 Other long term (current) drug therapy: Secondary | ICD-10-CM | POA: Diagnosis not present

## 2018-12-31 DIAGNOSIS — K219 Gastro-esophageal reflux disease without esophagitis: Secondary | ICD-10-CM | POA: Diagnosis not present

## 2018-12-31 DIAGNOSIS — E559 Vitamin D deficiency, unspecified: Secondary | ICD-10-CM | POA: Diagnosis not present

## 2018-12-31 DIAGNOSIS — I10 Essential (primary) hypertension: Secondary | ICD-10-CM | POA: Diagnosis not present

## 2018-12-31 DIAGNOSIS — E785 Hyperlipidemia, unspecified: Secondary | ICD-10-CM | POA: Diagnosis not present

## 2018-12-31 DIAGNOSIS — R7303 Prediabetes: Secondary | ICD-10-CM | POA: Diagnosis not present

## 2019-01-01 ENCOUNTER — Encounter: Payer: Self-pay | Admitting: *Deleted

## 2019-01-09 ENCOUNTER — Other Ambulatory Visit: Payer: Self-pay

## 2019-01-09 ENCOUNTER — Ambulatory Visit: Payer: Medicare HMO | Admitting: Internal Medicine

## 2019-01-09 ENCOUNTER — Encounter: Payer: Self-pay | Admitting: Internal Medicine

## 2019-01-09 VITALS — BP 152/80 | HR 84 | Temp 98.5°F | Ht 63.75 in | Wt 220.4 lb

## 2019-01-09 DIAGNOSIS — R0789 Other chest pain: Secondary | ICD-10-CM

## 2019-01-09 DIAGNOSIS — K219 Gastro-esophageal reflux disease without esophagitis: Secondary | ICD-10-CM

## 2019-01-09 DIAGNOSIS — K449 Diaphragmatic hernia without obstruction or gangrene: Secondary | ICD-10-CM

## 2019-01-09 DIAGNOSIS — R1013 Epigastric pain: Secondary | ICD-10-CM | POA: Diagnosis not present

## 2019-01-09 MED ORDER — NITROGLYCERIN 0.4 MG SL SUBL: SUBLINGUAL_TABLET | SUBLINGUAL | 0 refills | Status: DC

## 2019-01-09 NOTE — Progress Notes (Signed)
Subjective:    Patient ID: Alison Ayala, female    DOB: 11/25/1945, 73 y.o.   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  She is here alone today.  I saw her recently on 12/17/2018 for upper endoscopy.  This was performed to evaluate epigastric and substernal chest pain and reflux.  This showed a normal esophagus.  2 cm hiatal hernia.  Gastritis which was biopsied and a normal duodenum.  Esophageal biopsies were unremarkable.  Gastric biopsies showed mild chronic gastritis with features consistent with proton pump inhibitor effect.  There was no H. pylori, metaplasia or dysplasia.  She continues to have episodic and severe attacks of epigastric abdominal pain and substernal chest pain.  These attacks can be quite severe.  They are always after eating usually 20 to 30 minutes.  Seems to be worse when she is active such as bending over or picking up objects and that 20 to 30-minute period after eating.  If she is less active she has less pain.  The attacks last for 10 to 15 minutes.  Last was earlier today.  Has been present for the last 7 to 8 months but frequency has been increasing.  She has been on Nexium 40 mg twice daily.  I tried Carafate which was not beneficial.  No nausea except for very mild nausea a few times which was relieved with Zofran.  No vomiting.  Pain does not radiate.  Does not wake her from sleep.  Is not related to bowel movement.  For the past week she has had some constipation but usually she has loose stools 1-3 times a day which has been her normal since her VP shunt was placed years ago.  To evaluate this she had an ultrasound of the right upper quadrant in  September 2020 which showed hepatic steatosis but no acute findings.  This was followed up with a HIDA scan on 11/20/2018 which showed a normal ejection fraction from the gallbladder.  She did experience pain after oral Ensure consumption.  Cystic duct and CBD were patent evidenced by visualization of gallbladder and small bowel.  Of note she had a exercise stress test performed in June 2019.  Target heart rate was not reached but she did not have any signs or symptoms of ischemia.  Review of Systems As per HPI, otherwise negative  Current Medications, Allergies, Past Medical History, Past Surgical History, Family History and Social History were reviewed in [employer] record.     Objective:   Physical Exam BP (!) 152/80 (BP Location: Left Arm, Patient Position: Sitting, Cuff Size: Normal)   Pulse 84   Temp 98.5 F (36.9 C)   Ht 5' 3.75" (1.619 m) Comment: height measured without shoes  Wt 220 lb 6 oz (100 kg)   BMI 38.12 kg/m  Gen: awake, alert, NAD HEENT: anicteric, op clear CV: RRR, no mrg Pulm: CTA b/l Abd: soft, NT/ND, +BS throughout Ext: no c/c/e Neuro: nonfocal  CBC    Component Value Date/Time   WBC 5.4 12/13/2018 1259   RBC 4.52 12/13/2018 1259   HGB 14.2 12/13/2018 1259   HCT 42.3 12/13/2018 1259   PLT 237.0 12/13/2018 1259   MCV  93.7 12/13/2018 1259   MCH 31.3 08/09/2017 0445   MCHC 33.6 12/13/2018 1259   RDW 13.9 12/13/2018 1259   CMP     Component Value Date/Time   NA 141 12/13/2018 1259   K 3.6 12/13/2018 1259   CL 103 12/13/2018 1259   CO2 29 12/13/2018 1259   GLUCOSE 126 (H) 12/13/2018 1259   BUN 15 12/13/2018 1259   CREATININE 0.81 12/13/2018 1259   CALCIUM 10.1 12/13/2018 1259   PROT 7.0 12/13/2018 1259   ALBUMIN 4.6 12/13/2018 1259   AST 14 12/13/2018 1259   ALT 9 12/13/2018 1259   ALKPHOS 74 12/13/2018 1259   BILITOT 0.6 12/13/2018 1259   GFRNONAA >60 08/09/2017 0445   GFRAA >60 08/09/2017 0445       Assessment  & Plan:  73 year old female with a past medical history of GERD, hiatal hernia, 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   1.  Episodic substernal and epigastric pain --I am a bit puzzled as to the etiology of this pain though it has increased in frequency.  PPI has not helped improve this pain.  There was no response to Carafate.  Her gallbladder looks normal and her EF was normal by HIDA scan.  I feel the differential at this point given that the pain comes after eating, the location of the pain, etc. includes hiatal hernia related pain, esophageal spasm, and slightly less likely gallbladder pain/dyskinesia.  Her mediastinal mass/Castleman's disease surgery was 18 months ago, and a follow-up CT scan of the chest/high resolution in July showed resolution/resection of the anterior mediastinal mass. --I discussed surgical referral for consideration of hiatal hernia repair, but we also discussed cholecystectomy.  At this point I think we need to perform more objective testing before we decide for surgical referral --Discontinue Carafate --I am going to recommend peppermint altoids x 2 dissolved in mouth for any subsequent attacks of epigastric and chest pain.  Peppermint is a smooth muscle relaxer and has been proven to help with esophageal spasm.  If this fails to help I am going to try a single 0.4 mg sublingual nitroglycerin tab to see if this rapidly improves pain.  If so it is likely esophageal spasm.  She has had prior cardiac evaluation without ischemia.  I did warn her that sublingual nitroglycerin can cause dizziness, lightheadedness, headache and that she should only use a single dose when she is sitting down.  I would like her to report back to me how both the peppermint and if necessary  nitroglycerin affects this pain. --High-resolution esophageal manometry to evaluate esophageal motility and rule out spasm.  This would also be necessary prior to any consideration of hiatal hernia repair. --Continue Nexium for now 40 mg twice a day. --She can use ondansetron 4 mg every 6-8 hours as needed for nausea.  30 minutes spent with the patient today. Greater than 50% was spent in counseling and coordination of care with the patient

## 2019-01-09 NOTE — Patient Instructions (Addendum)
Please take peppermint 2 altoids for epigastric abdominal pain/chest pain. IF this does not help, you may take 1 sublingual nitroglycerin (make sure you are sitting when taking these as they can cause some changes in blood pressure etc).  We have sent the following medications to your pharmacy for you to pick up at your convenience: Nitroglycerin SL  Continue Nexium  Continue Zofran as needed.  _____________________________________________________________ Due to recent COVID-19 restrictions implemented by our local and state authorities and in an effort to keep both patients and staff as safe as possible, our hospital system now requires COVID-19 testing prior to any scheduled hospital procedure. Please go to our [facility] location drive thru testing site ([address] [place], [place] [address]) on 01/23/19 at 11:00 am. There will be multiple testing areas, the first checkpoint being for pre-procedure/surgery testing. Get into the right (yellow) lane that leads to the PAT testing team. You will not be billed at the time of testing but may receive a bill later depending on your insurance. The approximate cost of the test is $100. You must agree to quarantine from the time of your testing until the procedure date on 01/27/19 . This should include staying at home with ONLY the people you live with. Avoid take-out, grocery store shopping or leaving the house for any non-emergent reason. Failure to have your COVID-19 test done on the date and time you have been scheduled will result in cancellation of procedure. Please call our office at [phone] if you have any questions.  ___________________________________________________________ Dennis Bast have been scheduled for an esophageal manometry test at [facility] on 01/27/19 at 10:30 am. Please arrive 30 minutes prior to your procedure for registration. You will need to go to outpatient registration (1st floor of the hospital) first. Make  certain to bring your insurance cards as well as a complete list of medications.  Please remember the following:  1) Do not take any muscle relaxants, xanax (alprazolam) or ativan for 1 day prior to your test as well as the day of the test.  2) Nothing to eat or drink after 12:00 midnight on the night before your test.  3) Hold all diabetic medications/insulin the morning of the test. You may eat and take your medications after the test.  It will take at least 2 weeks to receive the results of this test from your physician.  ------------------------------------------ ABOUT ESOPHAGEAL MANOMETRY Esophageal manometry (muh-NOM-uh-tree) is a test that gauges how well your esophagus works. Your esophagus is the long, muscular tube that connects your throat to your stomach. Esophageal manometry measures the rhythmic muscle contractions (peristalsis) that occur in your esophagus when you swallow. Esophageal manometry also measures the coordination and force exerted by the muscles of your esophagus.  During esophageal manometry, a thin, flexible tube (catheter) that contains sensors is passed through your nose, down your esophagus and into your stomach. Esophageal manometry can be helpful in diagnosing some mostly uncommon disorders that affect your esophagus.  Why it's done Esophageal manometry is used to evaluate the movement (motility) of food through the esophagus and into the stomach. The test measures how well the circular bands of muscle (sphincters) at the top and bottom of your esophagus open and close, as well as the pressure, strength and pattern of the wave of esophageal muscle contractions that moves food along.  What you can expect Esophageal manometry is an outpatient procedure done without sedation. Most people tolerate it well. You may be asked to change  into a hospital gown before the test starts.  During esophageal manometry  . While you are sitting up, a member of your health care  team sprays your throat with a numbing medication or puts numbing gel in your nose or both.  . A catheter is guided through your nose into your esophagus. The catheter may be sheathed in a water-filled sleeve. It doesn't interfere with your breathing. However, your eyes may water, and you may gag. You may have a slight nosebleed from irritation.  . After the catheter is in place, you may be asked to lie on your back on an exam table, or you may be asked to remain seated.  . You then swallow small sips of water. As you do, a computer connected to the catheter records the pressure, strength and pattern of your esophageal muscle contractions.  . During the test, you'll be asked to breathe slowly and smoothly, remain as still as possible, and swallow only when you're asked to do so.  . A member of your health care team may move the catheter down into your stomach while the catheter continues its measurements.  . The catheter then is slowly withdrawn. The test usually lasts 20 to 30 minutes.  After esophageal manometry  When your esophageal manometry is complete, you may return to your normal activities  This test typically takes 30-45 minutes to complete. _______________________________________________________________

## 2019-01-21 ENCOUNTER — Ambulatory Visit: Payer: Medicare HMO | Admitting: Internal Medicine

## 2019-01-23 ENCOUNTER — Other Ambulatory Visit (HOSPITAL_COMMUNITY)
Admission: RE | Admit: 2019-01-23 | Discharge: 2019-01-23 | Disposition: A | Discharge status: Home / Self Care | Payer: Medicare HMO | Source: Ambulatory Visit | Attending: Internal Medicine | Admitting: Internal Medicine

## 2019-01-23 DIAGNOSIS — Z01812 Encounter for preprocedural laboratory examination: Secondary | ICD-10-CM | POA: Insufficient documentation

## 2019-01-23 DIAGNOSIS — Z20828 Contact with and (suspected) exposure to other viral communicable diseases: Secondary | ICD-10-CM | POA: Diagnosis not present

## 2019-01-24 LAB — NOVEL CORONAVIRUS, NAA (HOSP ORDER, SEND-OUT TO REF LAB; TAT 18-24 HRS): SARS-CoV-2, NAA: NOT DETECTED

## 2019-01-27 ENCOUNTER — Ambulatory Visit (HOSPITAL_COMMUNITY)
Admission: RE | Admit: 2019-01-27 | Discharge: 2019-01-27 | Disposition: A | Discharge status: Home / Self Care | Payer: Medicare HMO | Attending: Internal Medicine | Admitting: Internal Medicine

## 2019-01-27 ENCOUNTER — Encounter (HOSPITAL_COMMUNITY)
Admission: RE | Disposition: A | Discharge status: Home / Self Care | Payer: Self-pay | Source: Home / Self Care | Attending: Internal Medicine

## 2019-01-27 DIAGNOSIS — R0789 Other chest pain: Secondary | ICD-10-CM | POA: Diagnosis not present

## 2019-01-27 DIAGNOSIS — K224 Dyskinesia of esophagus: Secondary | ICD-10-CM

## 2019-01-27 DIAGNOSIS — R079 Chest pain, unspecified: Secondary | ICD-10-CM | POA: Diagnosis not present

## 2019-01-27 HISTORY — PX: ESOPHAGEAL MANOMETRY: SHX5429

## 2019-01-27 SURGERY — MANOMETRY, ESOPHAGUS

## 2019-01-27 MED ORDER — LIDOCAINE VISCOUS HCL 2 % MT SOLN
OROMUCOSAL | Status: AC
  Filled 2019-01-27: qty 15

## 2019-01-27 SURGICAL SUPPLY — 2 items
FACESHIELD LNG OPTICON STERILE (SAFETY) IMPLANT
GLOVE BIO SURGEON STRL SZ8 (GLOVE) ×4 IMPLANT

## 2019-01-27 NOTE — Progress Notes (Signed)
Esophageal manometry done per protocol. Pt tolerated well without distress or complication.

## 2019-01-28 ENCOUNTER — Encounter: Payer: Self-pay | Admitting: *Deleted

## 2019-02-04 ENCOUNTER — Telehealth: Payer: Self-pay | Admitting: Internal Medicine

## 2019-02-04 NOTE — Telephone Encounter (Signed)
Patient told the results are not in yet and she would be contacted with the results as soon as they were available.

## 2019-02-06 ENCOUNTER — Other Ambulatory Visit: Payer: Self-pay | Admitting: Internal Medicine

## 2019-02-06 DIAGNOSIS — R0789 Other chest pain: Secondary | ICD-10-CM

## 2019-02-06 DIAGNOSIS — K224 Dyskinesia of esophagus: Secondary | ICD-10-CM

## 2019-02-11 NOTE — Telephone Encounter (Signed)
E mano hard copy results faxed to Dr. Dema Severin at [phone]. Pt aware.

## 2019-02-11 NOTE — Telephone Encounter (Signed)
Pt stated that she was speaking to someone regarding results and that Dr. Orest Dikes office was expecting information regarding a medication.  Pt was confused and did not specify.

## 2019-02-26 ENCOUNTER — Ambulatory Visit: Payer: Medicare HMO | Attending: Internal Medicine

## 2019-02-26 DIAGNOSIS — Z23 Encounter for immunization: Secondary | ICD-10-CM | POA: Insufficient documentation

## 2019-02-26 NOTE — Progress Notes (Signed)
   Covid-19 Vaccination Clinic  Name:  Alison Ayala    MRN: [id number] DOB: August 12, 1945  02/26/2019  [name] was observed post Covid-19 immunization for 15 minutes without incidence. She was provided with Vaccine Information Sheet and instruction to access the V-Safe system.   [name] was instructed to call 911 with any severe reactions post vaccine: Marland Kitchen Difficulty breathing  . Swelling of your face and throat  . A fast heartbeat  . A bad rash all over your body  . Dizziness and weakness    Immunizations Administered    Name Date Dose VIS Date Route   Pfizer COVID-19 Vaccine 02/26/2019 10:43 AM 0.3 mL 01/17/2019 Intramuscular   Manufacturer: [place]   Lot: [id number]   [place]: [id number]

## 2019-03-05 DIAGNOSIS — K5090 Crohn's disease, unspecified, without complications: Secondary | ICD-10-CM | POA: Diagnosis not present

## 2019-03-05 DIAGNOSIS — H35372 Puckering of macula, left eye: Secondary | ICD-10-CM | POA: Diagnosis not present

## 2019-03-05 DIAGNOSIS — H1713 Central corneal opacity, bilateral: Secondary | ICD-10-CM | POA: Diagnosis not present

## 2019-03-05 DIAGNOSIS — Z0100 Encounter for examination of eyes and vision without abnormal findings: Secondary | ICD-10-CM | POA: Diagnosis not present

## 2019-03-05 DIAGNOSIS — H17823 Peripheral opacity of cornea, bilateral: Secondary | ICD-10-CM | POA: Diagnosis not present

## 2019-03-05 DIAGNOSIS — H18593 Other hereditary corneal dystrophies, bilateral: Secondary | ICD-10-CM | POA: Diagnosis not present

## 2019-03-17 ENCOUNTER — Ambulatory Visit: Payer: Medicare HMO | Attending: Internal Medicine

## 2019-03-17 DIAGNOSIS — Z23 Encounter for immunization: Secondary | ICD-10-CM | POA: Insufficient documentation

## 2019-03-17 NOTE — Progress Notes (Signed)
   Covid-19 Vaccination Clinic  Name:  Alison Ayala    MRN: [id number] DOB: 09-Oct-1945  03/17/2019  [name] was observed post Covid-19 immunization for 15 minutes without incidence. She was provided with Vaccine Information Sheet and instruction to access the V-Safe system.   [name] was instructed to call 911 with any severe reactions post vaccine: Marland Kitchen Difficulty breathing  . Swelling of your face and throat  . A fast heartbeat  . A bad rash all over your body  . Dizziness and weakness    Immunizations Administered    Name Date Dose VIS Date Route   Pfizer COVID-19 Vaccine 03/17/2019  3:45 PM 0.3 mL 01/17/2019 Intramuscular   Manufacturer: [place]   Lot: [id number]   [place]: [id number]

## 2019-04-02 ENCOUNTER — Telehealth: Payer: Self-pay | Admitting: Internal Medicine

## 2019-04-02 NOTE — Telephone Encounter (Signed)
Patient is calling in reference to a medication that has been increased by Dr. Dema Severin patient states she would like to inform Dr. Hilarie Fredrickson.

## 2019-04-02 NOTE — Telephone Encounter (Signed)
Patient indicates that she was given diltiazem 30 mg originally for hypercontractile esophagus but was more recently changed to 60 mg dosing TID (as of 03/11/19) by her PCP, Dr Dema Severin due to continued esophageal pain and elevated BP. Patient states that unfortunately, she has continued having esophageal pain even with increased dosage, sometimes having pain on a scale of 7/8 of 10 and on rare occasions 10/10. When she gets 10/10 pain, she takes nitroglycerin. She indicates that the nitroglycerin sometimes helps and sometimes doesn't really. She tells me that her diastolic BP are running ok (in the 0000000) but systolics are ranging between 140-184. Patient also indicates that she does try the peppermint altoids along with the diltiazem and nitroglycerin but doesn't find any additional effect with that either. I advised that while her BP's are important in the scheme of the medications we are prescribing, our main concern is controlling her spasms/pain. We would be more apt to have Dr Dema Severin deal with any BP issues not related to diltiazem. She agrees with this.  Dr Hilarie Fredrickson- 1. I see that you had actually wanted patient to eventually work to 60 mg QID dosing and she is currently only on TID dosing. Do you think increasing by 1 more dose daily would help or do you think she needs additional intervention?  2. I see that you wanted office follow up with you in 6-8 weeks. I do not see that scheduled, so next time I speak with patient, we can get that scheduled for her.  Patient wanted your input before asking Dr Dema Severin about changing medications around. Please advise.Marland KitchenMarland KitchenMarland Kitchen

## 2019-04-03 NOTE — Telephone Encounter (Signed)
I would recommend increasing diltiazem to 60 mg 4 times daily She should continue to work with Dr. Dema Severin about her overall blood pressure control particularly once we reached diltiazem 60 mg 4 times daily which would be about as high a dose as I would expect to use.  If it is not beneficial then we need to consider other options Botox injection in the lower esophagus can improve hypercontractile symptoms such as pain and we can discuss this at follow-up

## 2019-04-04 MED ORDER — DILTIAZEM HCL 60 MG PO TABS: 60.0000 mg | ORAL_TABLET | Freq: Four times a day (QID) | ORAL | 2 refills | Status: DC

## 2019-04-04 NOTE — Telephone Encounter (Signed)
I have spoken to patient to advise of Dr Vena Rua recommendations. She verbalizes understanding and indicates she will tell Dr Dema Severin. I have also sent rx in for diltiazem 60 mg QID dosing and have set patient up for folllow up office visit on 05/13/19.

## 2019-04-23 DIAGNOSIS — I10 Essential (primary) hypertension: Secondary | ICD-10-CM | POA: Diagnosis not present

## 2019-04-23 DIAGNOSIS — F325 Major depressive disorder, single episode, in full remission: Secondary | ICD-10-CM | POA: Diagnosis not present

## 2019-04-23 DIAGNOSIS — M810 Age-related osteoporosis without current pathological fracture: Secondary | ICD-10-CM | POA: Diagnosis not present

## 2019-04-23 DIAGNOSIS — E785 Hyperlipidemia, unspecified: Secondary | ICD-10-CM | POA: Diagnosis not present

## 2019-04-23 DIAGNOSIS — F3341 Major depressive disorder, recurrent, in partial remission: Secondary | ICD-10-CM | POA: Diagnosis not present

## 2019-04-30 DIAGNOSIS — L821 Other seborrheic keratosis: Secondary | ICD-10-CM | POA: Diagnosis not present

## 2019-04-30 DIAGNOSIS — D1801 Hemangioma of skin and subcutaneous tissue: Secondary | ICD-10-CM | POA: Diagnosis not present

## 2019-04-30 DIAGNOSIS — L918 Other hypertrophic disorders of the skin: Secondary | ICD-10-CM | POA: Diagnosis not present

## 2019-04-30 DIAGNOSIS — L720 Epidermal cyst: Secondary | ICD-10-CM | POA: Diagnosis not present

## 2019-04-30 DIAGNOSIS — L814 Other melanin hyperpigmentation: Secondary | ICD-10-CM | POA: Diagnosis not present

## 2019-04-30 DIAGNOSIS — D485 Neoplasm of uncertain behavior of skin: Secondary | ICD-10-CM | POA: Diagnosis not present

## 2019-04-30 DIAGNOSIS — L820 Inflamed seborrheic keratosis: Secondary | ICD-10-CM | POA: Diagnosis not present

## 2019-04-30 DIAGNOSIS — C44612 Basal cell carcinoma of skin of right upper limb, including shoulder: Secondary | ICD-10-CM | POA: Diagnosis not present

## 2019-04-30 DIAGNOSIS — Z85828 Personal history of other malignant neoplasm of skin: Secondary | ICD-10-CM | POA: Diagnosis not present

## 2019-05-01 ENCOUNTER — Other Ambulatory Visit: Payer: Self-pay | Admitting: Internal Medicine

## 2019-05-05 ENCOUNTER — Encounter: Payer: Self-pay | Admitting: *Deleted

## 2019-05-13 ENCOUNTER — Ambulatory Visit: Payer: Medicare HMO | Admitting: Internal Medicine

## 2019-05-13 ENCOUNTER — Encounter: Payer: Self-pay | Admitting: Internal Medicine

## 2019-05-13 VITALS — BP 142/76 | HR 73 | Temp 97.7°F | Ht 64.0 in | Wt 216.0 lb

## 2019-05-13 DIAGNOSIS — K224 Dyskinesia of esophagus: Secondary | ICD-10-CM | POA: Diagnosis not present

## 2019-05-13 DIAGNOSIS — K219 Gastro-esophageal reflux disease without esophagitis: Secondary | ICD-10-CM

## 2019-05-13 DIAGNOSIS — K5900 Constipation, unspecified: Secondary | ICD-10-CM | POA: Diagnosis not present

## 2019-05-13 MED ORDER — LINACLOTIDE 145 MCG PO CAPS: 145.0000 ug | ORAL_CAPSULE | Freq: Every day | ORAL | 2 refills | Status: DC

## 2019-05-13 MED ORDER — DILTIAZEM HCL ER COATED BEADS 240 MG PO CP24: 240.0000 mg | ORAL_CAPSULE | Freq: Every day | ORAL | 2 refills | Status: DC

## 2019-05-13 MED ORDER — ESOMEPRAZOLE MAGNESIUM 40 MG PO CPDR: 40.0000 mg | DELAYED_RELEASE_CAPSULE | Freq: Two times a day (BID) | ORAL | 2 refills | Status: DC

## 2019-05-13 NOTE — Patient Instructions (Addendum)
Please decrease Nexium to once daily dosing. We will leave your prescription as twice daily dosing just in case we need to increase it back again. If this does need to happen, please let us know beforehand.  We have sent the following medications to your pharmacy for you to pick up at your convenience: Diltiazem XL 240 mg daily Linzess 145 mcg daily  Please complete the Plenvu purge as discussed at your visit today.  You have been scheduled for a follow up with Dr Hilarie Fredrickson on 08/05/19 at 9:10 am.

## 2019-05-13 NOTE — Progress Notes (Signed)
Subjective:    Patient ID: Alison Ayala, female    DOB: [birth year], 74 y.o.   MRN: [id number]  HPI Alison Ayala is a 74 year old female with a history of GERD with hiatal hernia, jackhammer esophagus, colon polyps, diverticulosis, constipation, history of Castleman disease, hypertension, hyperlipidemia and history of VP shunt who is here for follow-up.  I last saw her in December 2020.  She is here alone today.  After her visit she had esophageal manometry which showed normal relaxation of the GE junction but a hypercontractile esophagus.  We started her on diltiazem in addition to Nexium which she was taking twice daily.  We titrated diltiazem up and once we achieved 60 mg 4 times daily her esophageal symptoms including chest pain and epigastric pain improved dramatically.  Her heartburn has remained well controlled.  While she is happy with the response to diltiazem she has continued to deal with constipation.  This was an issue for her prior to starting the diltiazem.  It was a issue long ago when she reports basically lifelong constipation up until her VP shunt was placed after which she had bowel movements daily.  However over the last 6 months she has felt constipated with abdominal bloating and fullness which makes her miserable.  She seems to respond best to Ex-Lax but she is not use this on a daily basis.  She is currently taking MiraLAX 3 times daily and with this she still does not have complete evacuation.  No blood in her stool or melena.  Her last colonoscopy was performed in October 2017.  She had 6 polyps removed the largest being 7 mm.  Diverticulosis in the sigmoid and small internal hemorrhoids.  These polyps histologically were tubular adenoma and hyperplastic.   Review of Systems As per HPI, otherwise negative  Current Medications, Allergies, Past Medical History, Past Surgical History, Family History and Social History were reviewed in [employer] record.     Objective:   Physical Exam BP (!) 142/76   Pulse 73   Temp 97.7 F (36.5 C)   Ht 5\' 4"  (1.626 m)   Wt 216 lb (98 kg)   BMI 37.08 kg/m  Gen: awake, alert, NAD HEENT: anicteric CV: RRR, no mrg Pulm: CTA b/l Abd: soft, obese, NT/ND, +BS throughout Ext: no c/c/e Neuro: nonfocal      Assessment & Plan:   75 year old female with a history of GERD with hiatal hernia, jackhammer esophagus, colon polyps, diverticulosis, constipation, history of Castleman disease, hypertension, hyperlipidemia and history of VP shunt who is here for follow-up.   1.  Jackhammer esophagus/GERD--improved with calcium channel blocker.  I think we can try to reduce Nexium to 40 mg once daily.  We will consolidate diltiazem and to long-acting formulation but she should let me know if this is less effective --Reduce Nexium to 40 mg once a day, can use a second dose in the evening if needed --Change diltiazem from 60 mg 4 times daily to 240 mg once a day and the long-acting formulation  2.  Constipation --up-to-date with colonoscopy.  Will give bowel purge with Plenvu and then try Linzess.  We discussed the risks of Linzess and we may need to dose titrate --Plenvu purge she can either use the first half for the entire bowel prep, instructions given --Linzess 145 mcg daily, dose titrate as needed  Follow-up in 8 to 12 weeks, sooner if needed.  If doing well this appointment may be canceled  30 minutes total spent today including patient facing time, coordination of care, reviewing medical history/procedures/pertinent radiology studies, and documentation of the encounter.

## 2019-05-21 DIAGNOSIS — C44612 Basal cell carcinoma of skin of right upper limb, including shoulder: Secondary | ICD-10-CM | POA: Diagnosis not present

## 2019-05-24 ENCOUNTER — Other Ambulatory Visit: Payer: Self-pay | Admitting: Internal Medicine

## 2019-06-17 DIAGNOSIS — F439 Reaction to severe stress, unspecified: Secondary | ICD-10-CM | POA: Diagnosis not present

## 2019-06-17 DIAGNOSIS — E785 Hyperlipidemia, unspecified: Secondary | ICD-10-CM | POA: Diagnosis not present

## 2019-06-17 DIAGNOSIS — I10 Essential (primary) hypertension: Secondary | ICD-10-CM | POA: Diagnosis not present

## 2019-06-17 DIAGNOSIS — H00014 Hordeolum externum left upper eyelid: Secondary | ICD-10-CM | POA: Diagnosis not present

## 2019-07-23 ENCOUNTER — Other Ambulatory Visit: Payer: Self-pay

## 2019-07-23 MED ORDER — DILTIAZEM HCL ER COATED BEADS 240 MG PO CP24: 240.0000 mg | ORAL_CAPSULE | Freq: Every day | ORAL | 0 refills | Status: DC

## 2019-07-28 ENCOUNTER — Other Ambulatory Visit: Payer: Self-pay | Admitting: Internal Medicine

## 2019-07-28 NOTE — Telephone Encounter (Signed)
Okay to return to previous nonextended release dosing interval

## 2019-07-28 NOTE — Telephone Encounter (Signed)
Dr Hilarie Fredrickson, I have received the following note from patient's pharmacy:  "PT PREFERS [facility]. PLEASE SEND NEW RX AS APPROPRIATE. SHE DOES NOT WANT CD/ER/24HR"  Is this ok with you or do you prefer she stay on long acting?

## 2019-08-05 ENCOUNTER — Ambulatory Visit: Payer: Medicare HMO | Admitting: Internal Medicine

## 2019-08-07 DIAGNOSIS — F325 Major depressive disorder, single episode, in full remission: Secondary | ICD-10-CM | POA: Diagnosis not present

## 2019-08-07 DIAGNOSIS — I10 Essential (primary) hypertension: Secondary | ICD-10-CM | POA: Diagnosis not present

## 2019-09-15 DIAGNOSIS — I10 Essential (primary) hypertension: Secondary | ICD-10-CM | POA: Diagnosis not present

## 2019-09-23 DIAGNOSIS — M810 Age-related osteoporosis without current pathological fracture: Secondary | ICD-10-CM | POA: Diagnosis not present

## 2019-09-23 DIAGNOSIS — I10 Essential (primary) hypertension: Secondary | ICD-10-CM | POA: Diagnosis not present

## 2019-09-23 DIAGNOSIS — E785 Hyperlipidemia, unspecified: Secondary | ICD-10-CM | POA: Diagnosis not present

## 2019-09-23 DIAGNOSIS — F3341 Major depressive disorder, recurrent, in partial remission: Secondary | ICD-10-CM | POA: Diagnosis not present

## 2019-09-23 DIAGNOSIS — F325 Major depressive disorder, single episode, in full remission: Secondary | ICD-10-CM | POA: Diagnosis not present

## 2019-10-10 DIAGNOSIS — H52213 Irregular astigmatism, bilateral: Secondary | ICD-10-CM | POA: Diagnosis not present

## 2019-10-10 DIAGNOSIS — Z961 Presence of intraocular lens: Secondary | ICD-10-CM | POA: Diagnosis not present

## 2019-10-10 DIAGNOSIS — H18593 Other hereditary corneal dystrophies, bilateral: Secondary | ICD-10-CM | POA: Diagnosis not present

## 2019-10-10 DIAGNOSIS — H17823 Peripheral opacity of cornea, bilateral: Secondary | ICD-10-CM | POA: Diagnosis not present

## 2019-10-10 DIAGNOSIS — H18503 Unspecified hereditary corneal dystrophies, bilateral: Secondary | ICD-10-CM | POA: Diagnosis not present

## 2019-10-17 DIAGNOSIS — I10 Essential (primary) hypertension: Secondary | ICD-10-CM | POA: Diagnosis not present

## 2019-10-20 ENCOUNTER — Ambulatory Visit: Payer: Medicare HMO | Admitting: Cardiovascular Disease

## 2019-10-20 ENCOUNTER — Other Ambulatory Visit: Payer: Self-pay

## 2019-10-20 ENCOUNTER — Encounter: Payer: Self-pay | Admitting: Cardiovascular Disease

## 2019-10-20 VITALS — BP 138/72 | HR 63 | Ht 64.0 in | Wt 216.4 lb

## 2019-10-20 DIAGNOSIS — R079 Chest pain, unspecified: Secondary | ICD-10-CM | POA: Diagnosis not present

## 2019-10-20 DIAGNOSIS — K224 Dyskinesia of esophagus: Secondary | ICD-10-CM

## 2019-10-20 NOTE — Progress Notes (Signed)
Cardiology Office Note:    Date:  10/20/2019   ID:  Alison Ayala, DOB 1945-10-09, MRN [id number]  PCP:  Harlan Stains, MD  Cardiologist:  Acie Fredrickson   Referring MD: Harlan Stains, MD   No chief complaint on file.    Problem list 1.  Hypertension 2.  Hyperlipidemia 3.  Mitral valve prolapse 4.  Mediastinal mass  07/23/17   Alison Ayala is a 74 y.o. female with a hx of  MVP and a family hx of CAD. 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  She is never had any serious cardiac issues.  She has had heart murmur and has been diagnosed with mitral valve prolapse.  Shes been exercising at the gym for the past several years.   No CP or dyspnea.  Gets tired more easily over the past several weeks.   Needs to have her mediastinal mass removed and a lung biopsy.   Her father has a hx of CAD    October 20, 2019:  Alison Ayala is seen today for follow-up visit.  She has a history of mitral valve prolapse.  I saw her several years ago as part of a preoperative evaluation prior to removal of a mediastinal mass.  Now presents with some episodes of CP  First episode occurred after eating a BBQ sandwich. Saw GI ( [facility])  Was eventually found to have esophageal spasm Episodes seem to be related to her eating .  Has difficulty walking   Has severe episode of esophageal spasm after eating a Frosty from [facility]   Takes diltiazem QID, seems to help quite a bit   Past Medical History:  Diagnosis Date  . Carpal tunnel syndrome of right wrist   . Diverticulosis   . GERD (gastroesophageal reflux disease)   . H/O hiatal hernia   . H/O intraventricular hemorrhage 2004--  SPONTANEOUS   S/P SHUNT  . Heart murmur   . Hiatal hernia   . Hip pain, right OCCASIONAL -- PINCHED NERVE  . History of gastric ulcer 2001  . Hyperlipidemia   . Hypertension   . Hypoglycemia   . Internal hemorrhoids   . Interstitial lung disease ([place]) 2019    did lung test but has since improved per patient  . Jackhammer esophagus   . MVP (mitral valve prolapse)   . Normal echocardiogram 30 YRS AGO  . S/P ventriculoperitoneal shunt 2004--- PER PT SHUNT IS WORKING WELL --  NO ISSUES  . Squamous cell cancer of scalp and skin of neck    mole back of head  . Tubular adenoma of colon     Past Surgical History:  Procedure Laterality Date  . BILATERAL FRONTAL INTERVENTRICULAR CATHETER VIA TWIST DRILL HOLE  AUG 2004   SPONTANEOUS SUBARACHNOID  HEMORRHAGE AND  INTRAVENTRICULAR HEMORRHAGE  . CARPAL TUNNEL RELEASE  07/26/2011   Procedure: CARPAL TUNNEL RELEASE;  Surgeon: Magnus Sinning, MD;  Location: [place];  Service: Orthopedics;  Laterality: Right;  . CATARACT EXTRACTION W/ INTRAOCULAR LENS  IMPLANT, BILATERAL  2012   BILATERAL  . DUPUYTREN CONTRACTURE RELEASE  07/26/2011   Procedure: DUPUYTREN CONTRACTURE RELEASE;  Surgeon: Magnus Sinning, MD;  Location: [facility];  Service: Orthopedics;  Laterality: Right;  EXCISION OF DUPYTRENS BAND THUMB- INDEX WEB SPACE  . ESOPHAGEAL MANOMETRY N/A 01/27/2019   Procedure: ESOPHAGEAL MANOMETRY (EM);  Surgeon: Jerene Bears, MD;  Location: [facility];  Service: Gastroenterology;  Laterality: N/A;  . LUNG BIOPSY Right 08/07/2017   Procedure: Open LUNG BIOPSY;  Surgeon: Ivin Poot, MD;  Location: [place];  Service: Thoracic;  Laterality: Right;  . RESECTION OF MEDIASTINAL MASS N/A 08/07/2017   Procedure: RESECTION OF MEDIASTINAL MASS;  Surgeon: Ivin Poot, MD;  Location: [facility];  Service: Thoracic;  Laterality: N/A;  . RETINAL DETACHMENT REPAIR W/ SCLERAL BUCKLE LE  03-10-2011   LEFT EYE  . STERNOTOMY N/A 08/07/2017   Procedure: STERNOTOMY;  Surgeon: Prescott Gum, Collier Salina, MD;  Location: [facility];  Service: Thoracic;  Laterality: N/A;  . VENTRICULOPERITONEAL SHUNT  10-08-2002   RIGHT FRONTAL FOR COMMUNICATING HYDROCEPHALUS    Current Medications: Current Meds  Medication  Sig  . acetaminophen (TYLENOL) 500 MG tablet Take 1-2 tablets (500-1,000 mg total) by mouth 2 (two) times daily as needed for moderate pain or headache.  . diltiazem (CARDIZEM) 60 MG tablet TAKE 1 TABLET (60 MG TOTAL) BY MOUTH 4 (FOUR) TIMES DAILY.  Marland Kitchen diltiazem (CARTIA XT) 240 MG 24 hr capsule Take 1 capsule (240 mg total) by mouth daily. Pharmacy-please d/c rx for short acting diltiazem  . esomeprazole (NEXIUM) 40 MG capsule Take 1 capsule (40 mg total) by mouth 2 (two) times daily before a meal.  . FLUoxetine (PROZAC) 20 MG capsule Take 20 mg by mouth daily.  . hydrochlorothiazide (HYDRODIURIL) 50 MG tablet Take 50 mg by mouth daily.  Marland Kitchen ibuprofen (ADVIL,MOTRIN) 200 MG tablet Take 400-600 mg by mouth 2 (two) times daily as needed for headache or moderate pain.  . metoprolol succinate (TOPROL-XL) 100 MG 24 hr tablet Take 100 mg by mouth daily. Take with or immediately following a meal.  . nitroGLYCERIN (NITROSTAT) 0.4 MG SL tablet DISSOLVE 1 TABLET UNDER THE TONGUE ONCE FOR EPIGASTRIC PAIN/CHEST PAIN NOT RELIEVED BY PEPPERMINT ALTOIDS. IF THIS DOES NOT RESOLVE PAIN, CALL DR!  Marland Kitchen ondansetron (ZOFRAN-ODT) 4 MG disintegrating tablet TAKE 1 TABLET BY MOUTH EVERY 8 HOURS AS NEEDED FOR NAUSEA AND VOMITING  . Polyethylene Glycol 400 (BLINK TEARS OP) Place 1 drop into both eyes daily as needed (dry eyes).  . pravastatin (PRAVACHOL) 80 MG tablet Take 1 tablet (80 mg total) by mouth daily.  . RESTASIS 0.05 % ophthalmic emulsion Place 1 drop into both eyes 2 (two) times daily.     Allergies:   Carvedilol, Claritin [loratadine], Lipitor [atorvastatin], and Lisinopril   Social History   Socioeconomic History  . Marital status: Married    Spouse name: Not on file  . Number of children: Not on file  . Years of education: Not on file  . Highest education level: Not on file  Occupational History  . Occupation: retired  Tobacco Use  . Smoking status: Never Smoker  . Smokeless tobacco: Never Used  Vaping  Use  . Vaping Use: Never used  Substance and Sexual Activity  . Alcohol use: Yes    Alcohol/week: 2.0 standard drinks    Types: 2 Glasses of wine per week    Comment: OCCASIONAL  . Drug use: No  . Sexual activity: Not on file  Other Topics Concern  . Not on file  Social History Narrative  . Not on file   Social Determinants of Health   Financial Resource Strain:   . Difficulty of Paying Living Expenses: Not on file  Food Insecurity:   . Worried About Charity fundraiser in the Last Year: Not on file  . Ran Out of Food in the Last Year:  Not on file  Transportation Needs:   . Lack of Transportation (Medical): Not on file  . Lack of Transportation (Non-Medical): Not on file  Physical Activity:   . Days of Exercise per Week: Not on file  . Minutes of Exercise per Session: Not on file  Stress:   . Feeling of Stress : Not on file  Social Connections:   . Frequency of Communication with Friends and Family: Not on file  . Frequency of Social Gatherings with Friends and Family: Not on file  . Attends Religious Services: Not on file  . Active Member of Clubs or Organizations: Not on file  . Attends Archivist Meetings: Not on file  . Marital Status: Not on file     Family History: The patient's family history includes Breast cancer in her maternal aunt; Colon cancer (age of onset: 49) in her mother; Colon polyps in her mother; Stomach cancer in her maternal aunt; Uterine cancer in her maternal aunt. There is no history of Rectal cancer or Esophageal cancer.  ROS:   Please see the history of present illness.     All other systems reviewed and are negative.  EKGs/Labs/Other Studies Reviewed:    The following studies were reviewed today:   EKG:  July 23, 2017:    NSR with 1st degree AV block . hhr 72   Recent Labs: 12/13/2018: ALT 9; BUN 15; Creatinine, Ser 0.81; Hemoglobin 14.2; Platelets 237.0; Potassium 3.6; Sodium 141  Recent Lipid Panel No results found for:  CHOL, TRIG, HDL, CHOLHDL, VLDL, LDLCALC, LDLDIRECT  Physical Exam:    Physical Exam: Blood pressure 138/72, pulse 63, height 5\' 4"  (1.626 m), weight 216 lb 6.4 oz (98.2 kg), SpO2 95 %.  GEN:  Well nourished, well developed in no acute distress HEENT: Normal NECK: No JVD; No carotid bruits LYMPHATICS: No lymphadenopathy CARDIAC: RRR , no murmurs, rubs, gallops RESPIRATORY:  Clear to auscultation without rales, wheezing or rhonchi  ABDOMEN: Soft, non-tender, non-distended MUSCULOSKELETAL:  No edema; No deformity  SKIN: Warm and dry NEUROLOGIC:  Alert and oriented x 3  ECG :  Sept. 13, 2021:   NSR at 63.  No ST or T wave changes.    ASSESSMENT:    1. Chest pain of uncertain etiology   2. Esophageal spasm    PLAN:    In order of problems listed above:  1.  Chest pain /  Esophageal spasm :  Her CP  Is likely esophageal spasm.  She has had episodes of chest discomfort related to eating foods.  In particular she has had severe chest pains after eating when these frosty and other foods that seem to stick in her esophagus.  Pains are relieved with nitroglycerin.  We performed a stress test several years ago which was negative.  I have advised her to start a regular exercise program.  I will plan on seeing her again in a year.  If the characteristics of her chest pain change then she is to call us right away.    Medication Adjustments/Labs and Tests Ordered: Current medicines are reviewed at length with the patient today.  Concerns regarding medicines are outlined above.  Orders Placed This Encounter  Procedures  . EKG 12-Lead   No orders of the defined types were placed in this encounter.   Patient Instructions  Medication Instructions:  Your physician recommends that you continue on your current medications as directed. Please refer to the Current Medication list given to you today.  *  If you need a refill on your cardiac medications before your next appointment, please call  your pharmacy*   Lab Work: None  If you have labs (blood work) drawn today and your tests are completely normal, you will receive your results only by: Marland Kitchen MyChart Message (if you have MyChart) OR . A paper copy in the mail If you have any lab test that is abnormal or we need to change your treatment, we will call you to review the results.   Testing/Procedures: None   Follow-Up: At [facility], you and your health needs are our priority.  As part of our continuing mission to provide you with exceptional heart care, we have created designated Provider Care Teams.  These Care Teams include your primary Cardiologist (physician) and Advanced Practice Providers (APPs -  Physician Assistants and Nurse Practitioners) who all work together to provide you with the care you need, when you need it.  We recommend signing up for the patient portal called "MyChart".  Sign up information is provided on this After Visit Summary.  MyChart is used to connect with patients for Virtual Visits (Telemedicine).  Patients are able to view lab/test results, encounter notes, upcoming appointments, etc.  Non-urgent messages can be sent to your provider as well.   To learn more about what you can do with MyChart, go to [email or web address].    Your next appointment:   12 month(s)  The format for your next appointment:   In Person  Provider:   You may see Alison Moores, MD or one of the following Advanced Practice Providers on your designated Care Team:    Richardson Dopp, PA-C  Robbie Lis, [place]    Other Instructions None     Signed, Alison Moores, MD  10/20/2019 12:34 PM    [place]

## 2019-10-20 NOTE — Patient Instructions (Signed)
Medication Instructions:  Your physician recommends that you continue on your current medications as directed. Please refer to the Current Medication list given to you today.  *If you need a refill on your cardiac medications before your next appointment, please call your pharmacy*   Lab Work: None  If you have labs (blood work) drawn today and your tests are completely normal, you will receive your results only by: . MyChart Message (if you have MyChart) OR . A paper copy in the mail If you have any lab test that is abnormal or we need to change your treatment, we will call you to review the results.   Testing/Procedures: None   Follow-Up: At [facility], you and your health needs are our priority.  As part of our continuing mission to provide you with exceptional heart care, we have created designated Provider Care Teams.  These Care Teams include your primary Cardiologist (physician) and Advanced Practice Providers (APPs -  Physician Assistants and Nurse Practitioners) who all work together to provide you with the care you need, when you need it.  We recommend signing up for the patient portal called "MyChart".  Sign up information is provided on this After Visit Summary.  MyChart is used to connect with patients for Virtual Visits (Telemedicine).  Patients are able to view lab/test results, encounter notes, upcoming appointments, etc.  Non-urgent messages can be sent to your provider as well.   To learn more about what you can do with MyChart, go to [email or web address].    Your next appointment:   12 month(s)  The format for your next appointment:   In Person  Provider:   You may see Philip Nahser, MD or one of the following Advanced Practice Providers on your designated Care Team:    Scott Weaver, PA-C  Vin Bhagat, PA-C    Other Instructions None  

## 2019-11-13 ENCOUNTER — Other Ambulatory Visit: Payer: Self-pay | Admitting: Internal Medicine

## 2019-11-18 DIAGNOSIS — H35372 Puckering of macula, left eye: Secondary | ICD-10-CM | POA: Diagnosis not present

## 2019-11-18 DIAGNOSIS — H16223 Keratoconjunctivitis sicca, not specified as Sjogren's, bilateral: Secondary | ICD-10-CM | POA: Diagnosis not present

## 2019-11-18 DIAGNOSIS — H18593 Other hereditary corneal dystrophies, bilateral: Secondary | ICD-10-CM | POA: Diagnosis not present

## 2019-11-18 DIAGNOSIS — H17823 Peripheral opacity of cornea, bilateral: Secondary | ICD-10-CM | POA: Diagnosis not present

## 2019-11-29 IMAGING — NM NM HEPATO W/GB/PHARM/[PERSON_NAME]
2 series · 12 of 12 positions shown · non-contrast
Comparison: None.

CLINICAL DATA: Upper abdominal pain

EXAM:
NUCLEAR MEDICINE HEPATOBILIARY IMAGING WITH GALLBLADDER EF
VIEWS:
Anterior right upper quadrant
RADIOPHARMACEUTICALS:  5.21 mCi Cc-OOm  Choletec IV

[he hepatobiliary · 4.52mm/px · 6 of 60 frames shown (1 of 2)]
[frame 6/60]
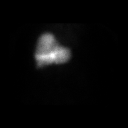
[frame 16/60]
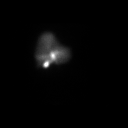
[frame 26/60]
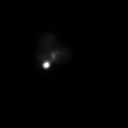
[frame 36/60]
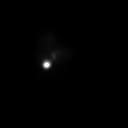
[frame 46/60]
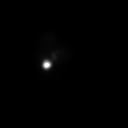
[frame 56/60]
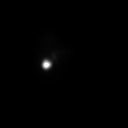

[he hepatobiliary · 4.52mm/px · 6 of 60 frames shown (2 of 2)]
[frame 6/60]
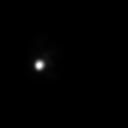
[frame 16/60]
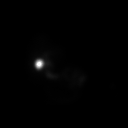
[frame 26/60]
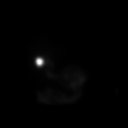
[frame 36/60]
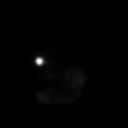
[frame 46/60]
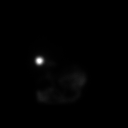
[frame 56/60]
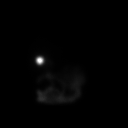

[12 of 12 positions shown; findings below may reference images not displayed]

FINDINGS: Liver uptake of radiotracer is unremarkable. There is prompt
visualization of gallbladder and small bowel, indicating patency of
the cystic and common bile ducts. The patient consumed 8 ounces of
Ensure orally with calculation of the computer generated ejection
fraction of radiotracer from the gallbladder. The patient developed
abdominal pain with the oral Ensure consumption. The computer
generated ejection fraction of radiotracer from the gallbladder is
normal at 65%, normal greater than 33% using the oral agent.
IMPRESSION: Normal ejection fraction of radiotracer from the gallbladder. Note
that patient did experience pain after oral Ensure consumption.
Cystic and common bile ducts are patent as is evidenced by
visualization of gallbladder and small bowel.

## 2019-12-01 DIAGNOSIS — F325 Major depressive disorder, single episode, in full remission: Secondary | ICD-10-CM | POA: Diagnosis not present

## 2019-12-01 DIAGNOSIS — M810 Age-related osteoporosis without current pathological fracture: Secondary | ICD-10-CM | POA: Diagnosis not present

## 2019-12-01 DIAGNOSIS — F3341 Major depressive disorder, recurrent, in partial remission: Secondary | ICD-10-CM | POA: Diagnosis not present

## 2019-12-01 DIAGNOSIS — E785 Hyperlipidemia, unspecified: Secondary | ICD-10-CM | POA: Diagnosis not present

## 2019-12-01 DIAGNOSIS — I10 Essential (primary) hypertension: Secondary | ICD-10-CM | POA: Diagnosis not present

## 2019-12-02 DIAGNOSIS — H18523 Epithelial (juvenile) corneal dystrophy, bilateral: Secondary | ICD-10-CM | POA: Diagnosis not present

## 2019-12-03 ENCOUNTER — Telehealth: Payer: Self-pay | Admitting: Internal Medicine

## 2019-12-03 NOTE — Telephone Encounter (Signed)
Patient needs an appointment as previously indicated on 11/13/19. She was to follow with Dr Hilarie Fredrickson 8-12 weeks from her last appointment with Dr Hilarie Fredrickson 05/13/19 but still has not done so.  Refused   Disp Refills Start End  ondansetron (ZOFRAN-ODT) 4 MG disintegrating tablet [Pharmacy Med Name: ONDANSETRON ODT 4 MG TABLET] 30 tablet 1 11/13/2019   Sig:  TAKE 1 TABLET BY MOUTH EVERY 8 HOURS AS NEEDED FOR NAUSEA AND VOMITING  Class:  Normal  DAW:  No  Comment:  NEEDS REFILLLS  Reason for Refusal:  Patient needs an appointment

## 2019-12-06 ENCOUNTER — Other Ambulatory Visit: Payer: Self-pay | Admitting: Internal Medicine

## 2019-12-26 DIAGNOSIS — H18523 Epithelial (juvenile) corneal dystrophy, bilateral: Secondary | ICD-10-CM | POA: Diagnosis not present

## 2019-12-29 DIAGNOSIS — K219 Gastro-esophageal reflux disease without esophagitis: Secondary | ICD-10-CM | POA: Diagnosis not present

## 2019-12-29 DIAGNOSIS — F3341 Major depressive disorder, recurrent, in partial remission: Secondary | ICD-10-CM | POA: Diagnosis not present

## 2019-12-29 DIAGNOSIS — M810 Age-related osteoporosis without current pathological fracture: Secondary | ICD-10-CM | POA: Diagnosis not present

## 2019-12-29 DIAGNOSIS — E785 Hyperlipidemia, unspecified: Secondary | ICD-10-CM | POA: Diagnosis not present

## 2019-12-29 DIAGNOSIS — I10 Essential (primary) hypertension: Secondary | ICD-10-CM | POA: Diagnosis not present

## 2020-01-05 DIAGNOSIS — I10 Essential (primary) hypertension: Secondary | ICD-10-CM | POA: Diagnosis not present

## 2020-01-05 DIAGNOSIS — E538 Deficiency of other specified B group vitamins: Secondary | ICD-10-CM | POA: Diagnosis not present

## 2020-01-05 DIAGNOSIS — M8588 Other specified disorders of bone density and structure, other site: Secondary | ICD-10-CM | POA: Diagnosis not present

## 2020-01-05 DIAGNOSIS — E559 Vitamin D deficiency, unspecified: Secondary | ICD-10-CM | POA: Diagnosis not present

## 2020-01-05 DIAGNOSIS — Z23 Encounter for immunization: Secondary | ICD-10-CM | POA: Diagnosis not present

## 2020-01-05 DIAGNOSIS — Z0000 Encounter for general adult medical examination without abnormal findings: Secondary | ICD-10-CM | POA: Diagnosis not present

## 2020-01-05 DIAGNOSIS — F3341 Major depressive disorder, recurrent, in partial remission: Secondary | ICD-10-CM | POA: Diagnosis not present

## 2020-01-05 DIAGNOSIS — R7303 Prediabetes: Secondary | ICD-10-CM | POA: Diagnosis not present

## 2020-01-05 DIAGNOSIS — E785 Hyperlipidemia, unspecified: Secondary | ICD-10-CM | POA: Diagnosis not present

## 2020-01-05 DIAGNOSIS — Z1159 Encounter for screening for other viral diseases: Secondary | ICD-10-CM | POA: Diagnosis not present

## 2020-01-06 ENCOUNTER — Ambulatory Visit: Payer: Medicare HMO | Admitting: Gastroenterology

## 2020-01-06 ENCOUNTER — Encounter: Payer: Self-pay | Admitting: Gastroenterology

## 2020-01-06 VITALS — BP 130/60 | HR 58 | Ht 64.0 in | Wt 219.0 lb

## 2020-01-06 DIAGNOSIS — K224 Dyskinesia of esophagus: Secondary | ICD-10-CM

## 2020-01-06 DIAGNOSIS — K219 Gastro-esophageal reflux disease without esophagitis: Secondary | ICD-10-CM | POA: Diagnosis not present

## 2020-01-06 DIAGNOSIS — R0789 Other chest pain: Secondary | ICD-10-CM | POA: Diagnosis not present

## 2020-01-06 MED ORDER — HYOSCYAMINE SULFATE 0.125 MG SL SUBL: 0.1250 mg | SUBLINGUAL_TABLET | Freq: Three times a day (TID) | SUBLINGUAL | 2 refills | Status: DC | PRN

## 2020-01-06 MED ORDER — ONDANSETRON 4 MG PO TBDP: ORAL_TABLET | ORAL | 1 refills | Status: DC

## 2020-01-06 MED ORDER — NITROGLYCERIN 0.4 MG SL SUBL: SUBLINGUAL_TABLET | SUBLINGUAL | 1 refills | Status: DC

## 2020-01-06 NOTE — Progress Notes (Signed)
Tried to reach the pt and voice mail is not set up will try later.  Will also send a My Chart message

## 2020-01-06 NOTE — Progress Notes (Signed)
01/06/2020 Alison Ayala [id number] 01/10/1946   HISTORY OF PRESENT ILLNESS: This is a 74 year old female who is a patient of Dr. Vena Rua.  She follows here for her issues with GERD and jackhammer esophagus.  She was last seen by him in April 2021.  She is overall doing well on her Nexium 40 mg daily and diltiazem 60 mg 4 times daily.  On occasion she takes a second dose of Nexium, but says that she does not need to do that often.  At her last visit her diltiazem was changed to 240 mcg once daily in the long-acting formulation, but she says that did not seem to help as well so she continues the 4 times daily dosing.  She is here today with complaints of intermittent epigastric abdominal and central chest pain that occurs with a lot of activity such as cleaning her house, walking, etc.  She saw cardiology back in September and they do not think that it is cardiac related.  She says that when this occurs she takes nitro and sits down for for about 10 minutes and then it goes away, but she is wondering if there is something else that she can take that might prevent this from happening.   Past Medical History:  Diagnosis Date  . Carpal tunnel syndrome of right wrist   . Diverticulosis   . GERD (gastroesophageal reflux disease)   . H/O hiatal hernia   . H/O intraventricular hemorrhage 2004--  SPONTANEOUS   S/P SHUNT  . Heart murmur   . Hiatal hernia   . Hip pain, right OCCASIONAL -- PINCHED NERVE  . History of gastric ulcer 2001  . Hyperlipidemia   . Hypertension   . Hypoglycemia   . Internal hemorrhoids   . Interstitial lung disease ([place]) 2019   did lung test but has since improved per patient  . Jackhammer esophagus   . MVP (mitral valve prolapse)   . Normal echocardiogram 30 YRS AGO  . S/P ventriculoperitoneal shunt 2004--- PER PT SHUNT IS WORKING WELL --  NO ISSUES  . Squamous cell cancer of scalp and skin of neck    mole back of head  . Tubular adenoma of colon    Past  Surgical History:  Procedure Laterality Date  . BILATERAL FRONTAL INTERVENTRICULAR CATHETER VIA TWIST DRILL HOLE  AUG 2004   SPONTANEOUS SUBARACHNOID  HEMORRHAGE AND  INTRAVENTRICULAR HEMORRHAGE  . CARPAL TUNNEL RELEASE  07/26/2011   Procedure: CARPAL TUNNEL RELEASE;  Surgeon: Magnus Sinning, MD;  Location: [facility];  Service: Orthopedics;  Laterality: Right;  . CATARACT EXTRACTION W/ INTRAOCULAR LENS  IMPLANT, BILATERAL  2012   BILATERAL  . DUPUYTREN CONTRACTURE RELEASE  07/26/2011   Procedure: DUPUYTREN CONTRACTURE RELEASE;  Surgeon: Magnus Sinning, MD;  Location: [place];  Service: Orthopedics;  Laterality: Right;  EXCISION OF DUPYTRENS BAND THUMB- INDEX WEB SPACE  . ESOPHAGEAL MANOMETRY N/A 01/27/2019   Procedure: ESOPHAGEAL MANOMETRY (EM);  Surgeon: Jerene Bears, MD;  Location: [facility];  Service: Gastroenterology;  Laterality: N/A;  . EYE SURGERY Bilateral    scrapped corneas  . LUNG BIOPSY Right 08/07/2017   Procedure: Open LUNG BIOPSY;  Surgeon: Ivin Poot, MD;  Location: [facility];  Service: Thoracic;  Laterality: Right;  . RESECTION OF MEDIASTINAL MASS N/A 08/07/2017   Procedure: RESECTION OF MEDIASTINAL MASS;  Surgeon: Ivin Poot, MD;  Location: [facility];  Service: Thoracic;  Laterality: N/A;  . RETINAL  DETACHMENT REPAIR W/ SCLERAL BUCKLE LE  03-10-2011   LEFT EYE  . STERNOTOMY N/A 08/07/2017   Procedure: STERNOTOMY;  Surgeon: Prescott Gum, Collier Salina, MD;  Location: [facility];  Service: Thoracic;  Laterality: N/A;  . VENTRICULOPERITONEAL SHUNT  10-08-2002   RIGHT FRONTAL FOR COMMUNICATING HYDROCEPHALUS    reports that she has never smoked. She has never used smokeless tobacco. She reports current alcohol use of about 2.0 standard drinks of alcohol per week. She reports that she does not use drugs. family history includes Breast cancer in her maternal aunt; Colon cancer (age of onset: 32) in her mother; Colon polyps in her mother; Stomach cancer  in her maternal aunt; Uterine cancer in her maternal aunt. Allergies  Allergen Reactions  . Carvedilol Palpitations and Other (See Comments)    Abdominal bloating  . Claritin [Loratadine] Other (See Comments)    Blurred vision and dry eyes  . Lipitor [Atorvastatin]     fatigue  . Lisinopril Other (See Comments)    "drunk feeling"      Outpatient Encounter Medications as of 01/06/2020  Medication Sig  . acetaminophen (TYLENOL) 500 MG tablet Take 1-2 tablets (500-1,000 mg total) by mouth 2 (two) times daily as needed for moderate pain or headache.  . diltiazem (CARDIZEM) 60 MG tablet Take 1 tablet (60 mg total) by mouth 4 (four) times daily. MUST HAVE OFFICE VISIT FOR ANY FURTHER REFILLS  . esomeprazole (NEXIUM) 40 MG capsule Take 1 capsule (40 mg total) by mouth 2 (two) times daily before a meal.  . FLUoxetine (PROZAC) 20 MG capsule Take 20 mg by mouth daily.  . hydrochlorothiazide (HYDRODIURIL) 50 MG tablet Take 50 mg by mouth daily.  Marland Kitchen ibuprofen (ADVIL,MOTRIN) 200 MG tablet Take 400-600 mg by mouth 2 (two) times daily as needed for headache or moderate pain.  Marland Kitchen losartan (COZAAR) 25 MG tablet Take 2 tablets by mouth daily.  . metoprolol succinate (TOPROL-XL) 100 MG 24 hr tablet Take 100 mg by mouth daily. Take with or immediately following a meal.  . nitroGLYCERIN (NITROSTAT) 0.4 MG SL tablet DISSOLVE 1 TABLET UNDER THE TONGUE ONCE FOR EPIGASTRIC PAIN/CHEST PAIN NOT RELIEVED BY PEPPERMINT ALTOIDS. IF THIS DOES NOT RESOLVE PAIN, CALL DR!  Marland Kitchen ondansetron (ZOFRAN-ODT) 4 MG disintegrating tablet TAKE 1 TABLET BY MOUTH EVERY 8 HOURS AS NEEDED FOR NAUSEA AND VOMITING  . Polyethylene Glycol 400 (BLINK TEARS OP) Place 1 drop into both eyes daily as needed (dry eyes).  . pravastatin (PRAVACHOL) 80 MG tablet Take 1 tablet (80 mg total) by mouth daily.  . RESTASIS 0.05 % ophthalmic emulsion Place 1 drop into both eyes 2 (two) times daily.  . [DISCONTINUED] diltiazem (CARTIA XT) 240 MG 24 hr capsule  Take 1 capsule (240 mg total) by mouth daily. Pharmacy-please d/c rx for short acting diltiazem   No facility-administered encounter medications on file as of 01/06/2020.     REVIEW OF SYSTEMS  : All other systems reviewed and negative except where noted in the History of Present Illness.   PHYSICAL EXAM: BP 130/60   Pulse (!) 58   Ht 5\' 4"  (1.626 m)   Wt 219 lb (99.3 kg)   BMI 37.59 kg/m  General: Well developed white female in no acute distress Head: Normocephalic and atraumatic Eyes:  Sclerae anicteric, conjunctiva pink. Ears: Normal auditory acuity Lungs: Clear throughout to auscultation; no W/R/R. Heart: Regular rate and rhythm; no M/R/G. Abdomen: Soft, non-distended.  BS present.  Non-tender. Musculoskeletal: Symmetrical with no gross deformities  Skin: No lesions on visible extremities Extremities: No edema  Neurological: Alert oriented x 4, grossly non-focal Psychological:  Alert and cooperative. Normal mood and affect  ASSESSMENT AND PLAN: 74 year old female with a history of GERD with hiatal hernia, jackhammer esophagus, colon polyps, diverticulosis, constipation, history of Castleman disease, hypertension, hyperlipidemia and history of VP shunt who is here for follow-up.   1.  Jackhammer esophagus/GERD--improved with calcium channel blocker.    Currently on Nexium 40 mg daily with an occasional second dose if needed.  Remains on diltiazem 60 mg 4 times daily instead of 240 mg once a day in the long-acting formula as it did not seem to work as well.  Complains today of epigastric and central chest pain that occurs with a lot of activity such as walking and cleaning house.  Cardiology does not think that this is cardiac related.  It is relieved with nitro, but she was wondering if there is something that she could take prior to activity to see if that would help prevent this pain from occurring.  We will try Levsin as needed prior to activity to see if that helps.   Prescription sent to pharmacy.  She will call us back with an update after trying this on several occasions.  We will also see then if Dr. Hilarie Fredrickson would have any other recommendations as well.  We will refill her Zofran and nitro prescriptions as well at her request.  CC:  Harlan Stains, MD

## 2020-01-06 NOTE — Progress Notes (Signed)
Please let the patient know that Dr. Hilarie Fredrickson recommended that she could try peppermint oil pre-activity which is a smooth muscle relaxer as well and can help in esophageal spasm.  Either 2 peppermint altoids or pure peppermint oil/extract.  Artificial peppermint flavor will not help.  That is another option if levsin does not help.  He also emphasized that if it continues despite these measures then she should follow-up with cardiology again to be sure not caridac related.  Thank you,  Jess

## 2020-01-06 NOTE — Progress Notes (Signed)
Addendum: Reviewed and agree with assessment and management plan. With exertional chest pain, I would want to be very sure this is not cardiac in nature, especially given the fact it is relieved by NTG.  If cardiac eval not recent I would suggest she revisit this specific exertional chest pain with cardiology Could try peppermint oil pre-activity which is a smooth muscle relaxer and can help in esophageal spasm.  Either 2 peppermint altoids or pure peppermint oil/extract.  Artificial peppermint flavor will not help. Continue diltiazem q6h. Tsion Inghram, Lajuan Lines, MD

## 2020-01-06 NOTE — Patient Instructions (Signed)
If you are age 74 or older, your body mass index should be between 23-30. Your Body mass index is 37.59 kg/m. If this is out of the aforementioned range listed, please consider follow up with your Primary Care Provider.  If you are age 71 or younger, your body mass index should be between 19-25. Your Body mass index is 37.59 kg/m. If this is out of the aformentioned range listed, please consider follow up with your Primary Care Provider.   We have sent the following medications to your pharmacy for you to pick up at your convenience: Zofran 4 mg ODT every 8 hours as needed.  Levsin 0.125 SL every 6-8 hours as needed.  Call with an update and ask for Koren Shiver, RN.

## 2020-01-17 ENCOUNTER — Other Ambulatory Visit: Payer: Self-pay | Admitting: Internal Medicine

## 2020-01-27 ENCOUNTER — Telehealth: Payer: Self-pay | Admitting: Gastroenterology

## 2020-01-27 ENCOUNTER — Telehealth: Payer: Self-pay | Admitting: Cardiovascular Disease

## 2020-01-27 MED ORDER — DILTIAZEM HCL 60 MG PO TABS
60.0000 mg | ORAL_TABLET | Freq: Four times a day (QID) | ORAL | 0 refills | Status: DC
Start: 2020-01-27 — End: 2020-03-01

## 2020-01-27 NOTE — Telephone Encounter (Signed)
The pt has been advised that the prescription was refilled. She will follow with cardiology. She did try the levsin but it did not work either.

## 2020-01-27 NOTE — Telephone Encounter (Signed)
Patient states she was told by her GI doctor to follow up with cardiology, she saw Dr. Acie Fredrickson on 10/20/19. She states she was attempting to get a medication refilled and that is when GI suggested she follow up with cardiology again. She states she hurts in her chest and stomach region, sometimes it is her lower abdomen. Patient would like to know how soon we think she should be seen.   Pt c/o of Chest Pain: STAT if CP now or developed within 24 hours  1. Are you having CP right now?  She states it depends on what you call her chest - "below her boobs but above her waist" is what hurts   2. Are you experiencing any other symptoms (ex. SOB, nausea, vomiting, sweating)?  Sometimes when she exerts herself her breathing get's harder, nausea sometimes but thinks it could be related to her esophagus spasms   3. How long have you been experiencing CP?  Continuously for 4-5 days. Has been an ongoing issue for about a year   4. Is your CP continuous or coming and going?  Continuous for last 4-5 days   5. Have you taken Nitroglycerin?  Took one a few minutes ago (12:58 when patient stated that) but states it did not do anything.   Please call/advise. Thank you! ?

## 2020-01-27 NOTE — Telephone Encounter (Signed)
Pt is wanting to inform the nurse that her medications that Alonza Bogus prescribed are not working for her, pr is also requesting a refill on her Diltiazem.  Pharmacy: [facility]

## 2020-01-27 NOTE — Telephone Encounter (Signed)
Ok to refill diltiazem.  I am sorry it did not work.  Did she try the levsin as well?  Agree, if those did not help then she should see cardiology as well.

## 2020-01-27 NOTE — Telephone Encounter (Signed)
The pt states she has tried peppermint oil with very little relief.  She also says she needs a referral for diltiazem.  She is making an appt to follow up with cardiology. Will refill diltiazem.

## 2020-01-27 NOTE — Telephone Encounter (Signed)
Patient stated she has been having upper abdomen discomfort for a while now. Patient stated her GI doctor wanted to rule out any heart related issues. Made patient an appointment with Dr. Acie Fredrickson, first available. Patient stated she thinks this is all related to her running out of diltiazem. Patient stated she has a refill to pick up and she will see if her symptoms subside with medication. Encouraged patient to call back with symptoms get worse or do not improve with taking diltiazem again. Went over ER precautions.

## 2020-01-28 NOTE — Telephone Encounter (Signed)
Pt made are of recommendation. Per pt - Dr. Venita Sheffield office refilled her Diltiazem yesterday for her. She will restart.  If better after the holiday she will call office to see if she still needs to keep 1/6 appt w/ Dr. Acie Fredrickson. She appreciates our advisement and agreeable to plan.

## 2020-01-28 NOTE — Telephone Encounter (Signed)
The patient has severe esophageal spasm and takes Diltiazem for this. She ran out of her diltiazem and now has CP I suggest that she restart the diltiazem and see if her symptoms improve.

## 2020-02-11 ENCOUNTER — Encounter: Payer: Self-pay | Admitting: Cardiovascular Disease

## 2020-02-11 NOTE — Progress Notes (Signed)
Cardiology Office Note:    Date:  02/12/2020   ID:  Alison Ayala, DOB [birth year], MRN [id number]  PCP:  Harlan Stains, MD  Cardiologist:  Acie Fredrickson   Referring MD: Harlan Stains, MD   Chief Complaint  Patient presents with  . Chest Pain     Problem list 1.  Hypertension 2.  Hyperlipidemia 3.  Mitral valve prolapse 4.  Mediastinal mass  07/23/17   Alison Ayala is a 75 y.o. female with a hx of  MVP and a family hx of CAD. 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  She is never had any serious cardiac issues.  She has had heart murmur and has been diagnosed with mitral valve prolapse.  Shes been exercising at the gym for the past several years.   No CP or dyspnea.  Gets tired more easily over the past several weeks.   Needs to have her mediastinal mass removed and a lung biopsy.   Her father has a hx of CAD    October 20, 2019:  Alison Ayala is seen today for follow-up visit.  She has a history of mitral valve prolapse.  I saw her several years ago as part of a preoperative evaluation prior to removal of a mediastinal mass.  Now presents with some episodes of CP  First episode occurred after eating a BBQ sandwich. Saw GI ( [facility])  Was eventually found to have esophageal spasm Episodes seem to be related to her eating .  Has difficulty walking   Has severe episode of esophageal spasm after eating a Frosty from [facility]   Takes diltiazem QID, seems to help quite a bit  Jan. 6, 2022: Alison Ayala is seen today for follow up of her MVP and atypical cp She needs to have a GI procedure and his here partly for pre-op clearance She has mvp .  Has extensive exertional induced chest pressure.  She had a negative ETT previously  She is on cardizem 60 Q6.   She thinks the immediate release cardizem works than cardizem CD.  Her symptoms are c/w unstable angina -   Has chest pressure with any amount of of  May be esophageal spasm  but her symptoms are exercise induced.   Past Medical History:  Diagnosis Date  . Carpal tunnel syndrome of right wrist   . Diverticulosis   . GERD (gastroesophageal reflux disease)   . H/O hiatal hernia   . H/O intraventricular hemorrhage 2004--  SPONTANEOUS   S/P SHUNT  . Heart murmur   . Hiatal hernia   . Hip pain, right OCCASIONAL -- PINCHED NERVE  . History of gastric ulcer 2001  . Hyperlipidemia   . Hypertension   . Hypoglycemia   . Internal hemorrhoids   . Interstitial lung disease ([place]) 2019   did lung test but has since improved per patient  . [place] esophagus   . MVP (mitral valve prolapse)   . Normal echocardiogram 30 YRS AGO  . S/P ventriculoperitoneal shunt 2004--- PER PT SHUNT IS WORKING WELL --  NO ISSUES  . Squamous cell cancer of scalp and skin of neck    mole back of head  . Tubular adenoma of colon     Past Surgical History:  Procedure Laterality Date  . BILATERAL FRONTAL INTERVENTRICULAR CATHETER VIA TWIST DRILL HOLE  AUG 2004   SPONTANEOUS SUBARACHNOID  HEMORRHAGE AND  INTRAVENTRICULAR HEMORRHAGE  . CARPAL TUNNEL RELEASE  07/26/2011  Procedure: CARPAL TUNNEL RELEASE;  Surgeon: Magnus Sinning, MD;  Location: [place];  Service: Orthopedics;  Laterality: Right;  . CATARACT EXTRACTION W/ INTRAOCULAR LENS  IMPLANT, BILATERAL  2012   BILATERAL  . DUPUYTREN CONTRACTURE RELEASE  07/26/2011   Procedure: DUPUYTREN CONTRACTURE RELEASE;  Surgeon: Magnus Sinning, MD;  Location: [place];  Service: Orthopedics;  Laterality: Right;  EXCISION OF DUPYTRENS BAND THUMB- INDEX WEB SPACE  . ESOPHAGEAL MANOMETRY N/A 01/27/2019   Procedure: ESOPHAGEAL MANOMETRY (EM);  Surgeon: Jerene Bears, MD;  Location: [facility];  Service: Gastroenterology;  Laterality: N/A;  . EYE SURGERY Bilateral    scrapped corneas  . LUNG BIOPSY Right 08/07/2017   Procedure: Open LUNG BIOPSY;  Surgeon: Ivin Poot, MD;  Location: [place];  Service:  Thoracic;  Laterality: Right;  . RESECTION OF MEDIASTINAL MASS N/A 08/07/2017   Procedure: RESECTION OF MEDIASTINAL MASS;  Surgeon: Ivin Poot, MD;  Location: [place];  Service: Thoracic;  Laterality: N/A;  . RETINAL DETACHMENT REPAIR W/ SCLERAL BUCKLE LE  03-10-2011   LEFT EYE  . STERNOTOMY N/A 08/07/2017   Procedure: STERNOTOMY;  Surgeon: Prescott Gum, Collier Salina, MD;  Location: [place];  Service: Thoracic;  Laterality: N/A;  . VENTRICULOPERITONEAL SHUNT  10-08-2002   RIGHT FRONTAL FOR COMMUNICATING HYDROCEPHALUS    Current Medications: Current Meds  Medication Sig  . acetaminophen (TYLENOL) 500 MG tablet Take 1-2 tablets (500-1,000 mg total) by mouth 2 (two) times daily as needed for moderate pain or headache.  . Cholecalciferol (VITAMIN D3) 50 MCG (2000 UT) capsule Take 1 capsule by mouth daily.  Marland Kitchen diltiazem (CARDIZEM) 60 MG tablet Take 1 tablet (60 mg total) by mouth 4 (four) times daily.  Marland Kitchen esomeprazole (NEXIUM) 40 MG capsule Take 1 capsule (40 mg total) by mouth 2 (two) times daily before a meal.  . FLUoxetine (PROZAC) 40 MG capsule Take 40 mg by mouth daily.  . Ginger, Zingiber officinalis, (GINGER ROOT PO) Take by mouth.  . hydrochlorothiazide (HYDRODIURIL) 50 MG tablet Take 50 mg by mouth daily.  . hyoscyamine (LEVSIN SL) 0.125 MG SL tablet Place 1 tablet (0.125 mg total) under the tongue every 8 (eight) hours as needed.  Marland Kitchen ibuprofen (ADVIL,MOTRIN) 200 MG tablet Take 400-600 mg by mouth 2 (two) times daily as needed for headache or moderate pain.  Marland Kitchen losartan (COZAAR) 50 MG tablet Take 1 tablet by mouth daily.  . metoprolol succinate (TOPROL-XL) 100 MG 24 hr tablet Take 100 mg by mouth daily. Take with or immediately following a meal.  . nitroGLYCERIN (NITROSTAT) 0.4 MG SL tablet DISSOLVE 1 TABLET UNDER THE TONGUE ONCE FOR EPIGASTRIC PAIN/CHEST PAIN NOT RELIEVED BY PEPPERMINT ALTOIDS. IF THIS DOES NOT RESOLVE PAIN, CALL DR!  Marland Kitchen ondansetron (ZOFRAN-ODT) 4 MG disintegrating tablet TAKE 1 TABLET  BY MOUTH EVERY 8 HOURS AS NEEDED FOR NAUSEA AND VOMITING  . Polyethylene Glycol 400 (BLINK TEARS OP) Place 1 drop into both eyes daily as needed (dry eyes).  . RESTASIS 0.05 % ophthalmic emulsion Place 1 drop into both eyes 2 (two) times daily.  . rosuvastatin (CRESTOR) 10 MG tablet Take 1 tablet (10 mg total) by mouth daily.  . vitamin B-12 (CYANOCOBALAMIN) 100 MCG tablet Take 100 mcg by mouth daily.  . [DISCONTINUED] pravastatin (PRAVACHOL) 80 MG tablet Take 1 tablet (80 mg total) by mouth daily.     Allergies:   Carvedilol, Claritin [loratadine], Lipitor [atorvastatin], and Lisinopril   Social History   Socioeconomic  History  . Marital status: Married    Spouse name: Timmothy Sours  . Number of children: 0  . Years of education: Not on file  . Highest education level: Not on file  Occupational History  . Occupation: retired  Tobacco Use  . Smoking status: Never Smoker  . Smokeless tobacco: Never Used  Vaping Use  . Vaping Use: Never used  Substance and Sexual Activity  . Alcohol use: Yes    Alcohol/week: 2.0 standard drinks    Types: 2 Glasses of wine per week    Comment: OCCASIONAL  . Drug use: No  . Sexual activity: Not on file  Other Topics Concern  . Not on file  Social History Narrative  . Not on file   Social Determinants of Health   Financial Resource Strain: Not on file  Food Insecurity: Not on file  Transportation Needs: Not on file  Physical Activity: Not on file  Stress: Not on file  Social Connections: Not on file     Family History: The patient's family history includes Breast cancer in her maternal aunt; Colon cancer (age of onset: 85) in her mother; Colon polyps in her mother; Stomach cancer in her maternal aunt; Uterine cancer in her maternal aunt. There is no history of Rectal cancer or Esophageal cancer.  ROS:   Please see the history of present illness.     All other systems reviewed and are negative.  EKGs/Labs/Other Studies Reviewed:    The  following studies were reviewed today:   EKG:  July 23, 2017:    NSR with 1st degree AV block . hhr 72   Recent Labs: No results found for requested labs within last 8760 hours.  Recent Lipid Panel No results found for: CHOL, TRIG, HDL, CHOLHDL, VLDL, LDLCALC, LDLDIRECT  Physical Exam:    Physical Exam: Blood pressure (!) 144/72, pulse 60, height _0  (1.626 m), weight 222 lb 3.2 oz (100.8 kg), SpO2 93 %.  GEN:  Well nourished, well developed in no acute distress HEENT: Normal NECK: No JVD; No carotid bruits LYMPHATICS: No lymphadenopathy CARDIAC: RRR , no murmurs, rubs, gallops RESPIRATORY:  Clear to auscultation without rales, wheezing or rhonchi  ABDOMEN: Soft, non-tender, non-distended MUSCULOSKELETAL:  No edema; No deformity  SKIN: Warm and dry NEUROLOGIC:  Alert and oriented x 3  ECG :   Jan. 6, 2022:   NSR at 60.  No ST or T wave changes.      ASSESSMENT:    1. Unstable angina pectoris ([place])    PLAN:    In order of problems listed above:  1.  Chest pain /  Esophageal spasm :   Her symptoms at this point sound consistent with unstable angina.  She is having intense chest pressure with minimal exercise.  She does not think that she can walk on a treadmill because she develops this chest pain after about a minute of walking.  She takes nitroglycerin as needed.  She also gets relief with Cardizem.  There may be esophageal spasm component but I think we need to rule out coronary artery disease.  As noted above I do not think that she will be able to do a stress test.  Given her weight I do not think that the [place] studies would be quite as accurate.  I think her best study is to do a coronary CT angiogram.  I will see her in 6 months .   Medication Adjustments/Labs and Tests Ordered: Current medicines are reviewed  at length with the patient today.  Concerns regarding medicines are outlined above.  Orders Placed This Encounter  Procedures  . CT CORONARY MORPH W/CTA  COR W/SCORE W/CA W/CM &/OR WO/CM  . CT CORONARY FRACTIONAL FLOW RESERVE DATA PREP  . CT CORONARY FRACTIONAL FLOW RESERVE FLUID ANALYSIS  . Lipid Profile  . Basic Metabolic Panel (BMET)  . Hepatic function panel  . Basic Metabolic Panel (BMET)  . EKG 12-Lead   Meds ordered this encounter  Medications  . rosuvastatin (CRESTOR) 10 MG tablet    Sig: Take 1 tablet (10 mg total) by mouth daily.    Dispense:  90 tablet    Refill:  3    Patient Instructions  Medication Instructions:  Your physician has recommended you make the following change in your medication:  STOP Pravastatin START Rosuvastatin (Crestor) 10 mg once daily  *If you need a refill on your cardiac medications before your next appointment, please call your pharmacy*   Lab Work: Your physician recommends that you return for lab work 2-7 days prior to your Coronary CT  Your physician recommends that you return for lab work in: 3 months on April 6. You may come in anytime after 7:30 am. You will need to FAST for this appointment - nothing to eat or drink after midnight the night before except water.  If you have labs (blood work) drawn today and your tests are completely normal, you will receive your results only by: Marland Kitchen MyChart Message (if you have MyChart) OR . A paper copy in the mail If you have any lab test that is abnormal or we need to change your treatment, we will call you to review the results.    Follow-Up: At [facility], you and your health needs are our priority.  As part of our continuing mission to provide you with exceptional heart care, we have created designated Provider Care Teams.  These Care Teams include your primary Cardiologist (physician) and Advanced Practice Providers (APPs -  Physician Assistants and Nurse Practitioners) who all work together to provide you with the care you need, when you need it.   Your next appointment:   6 month(s) on Wed. June 22 at 1:20 pm  The format for your  next appointment:   In Person  Provider:   You may see Mertie Moores, MD or one of the following Advanced Practice Providers on your designated Care Team:    Richardson Dopp, PA-C  Robbie Lis, [place]     Testing/Procedures: Your cardiac CT will be scheduled at one of the below locations:   [facility] [address] [place], [place] [address] [phone]  [place] [address] [place], [place] [address] [phone]  If scheduled at [facility], please arrive at the [facility] main entrance of [facility] 30 minutes prior to test start time. Proceed to the [facility] Radiology Department (first floor) to check-in and test prep.  If scheduled at [facility], please arrive 15 mins early for check-in and test prep.  Please follow these instructions carefully (unless otherwise directed):  Hold all erectile dysfunction medications at least 3 days (72 hrs) prior to test.  On the Night Before the Test: . Be sure to Drink plenty of water. . Do not consume any caffeinated/decaffeinated beverages or chocolate 12 hours prior to your test. . Do not take any antihistamines 12 hours prior to your test.  On the Day of the Test: . Drink plenty of water. Do not drink any water within one hour of the test. . Do not eat any food 4 hours prior to the test. . You may take your regular medications prior to the test.  . Take metoprolol (Lopressor) two hours prior to test. - Advised to continue Toprol 100 mg instead . HOLD Furosemide/Hydrochlorothiazide morning of the test. . FEMALES- please wear underwire-free bra if available        After the Test: . Drink plenty of water. . After receiving IV contrast, you may experience a mild flushed feeling. This is normal. . On occasion, you may experience a mild rash up to 24 hours after the test. This is not dangerous. If this occurs, you  can take Benadryl 25 mg and increase your fluid intake. . If you experience trouble breathing, this can be serious. If it is severe call 911 IMMEDIATELY. If it is mild, please call our office. . If you take any of these medications: Glipizide/Metformin, Avandament, Glucavance, please do not take 48 hours after completing test unless otherwise instructed.   Once we have confirmed authorization from your insurance company, we will call you to set up a date and time for your test. Based on how quickly your insurance processes prior authorizations requests, please allow up to 4 weeks to be contacted for scheduling your Cardiac CT appointment. Be advised that routine Cardiac CT appointments could be scheduled as many as 8 weeks after your provider has ordered it.  For non-scheduling related questions, please contact the cardiac imaging nurse navigator should you have any questions/concerns: Marchia Bond, Cardiac Imaging Nurse Navigator Burley Saver, Interim Cardiac Imaging Nurse [place] and Vascular Services Direct Office Dial: [phone]   For scheduling needs, including cancellations and rescheduling, please call [place], [phone].      Signed, Mertie Moores, MD  02/12/2020 6:22 PM    [place]

## 2020-02-12 ENCOUNTER — Ambulatory Visit: Payer: Medicare HMO | Admitting: Cardiovascular Disease

## 2020-02-12 ENCOUNTER — Encounter: Payer: Self-pay | Admitting: Cardiovascular Disease

## 2020-02-12 ENCOUNTER — Other Ambulatory Visit: Payer: Self-pay

## 2020-02-12 DIAGNOSIS — I200 Unstable angina: Secondary | ICD-10-CM | POA: Diagnosis not present

## 2020-02-12 DIAGNOSIS — I10 Essential (primary) hypertension: Secondary | ICD-10-CM | POA: Diagnosis not present

## 2020-02-12 DIAGNOSIS — K219 Gastro-esophageal reflux disease without esophagitis: Secondary | ICD-10-CM | POA: Diagnosis not present

## 2020-02-12 DIAGNOSIS — M810 Age-related osteoporosis without current pathological fracture: Secondary | ICD-10-CM | POA: Diagnosis not present

## 2020-02-12 DIAGNOSIS — F3341 Major depressive disorder, recurrent, in partial remission: Secondary | ICD-10-CM | POA: Diagnosis not present

## 2020-02-12 DIAGNOSIS — E785 Hyperlipidemia, unspecified: Secondary | ICD-10-CM | POA: Diagnosis not present

## 2020-02-12 MED ORDER — ROSUVASTATIN CALCIUM 10 MG PO TABS: 10.0000 mg | ORAL_TABLET | Freq: Every day | ORAL | 3 refills | Status: DC

## 2020-02-12 NOTE — Patient Instructions (Signed)
Medication Instructions:  Your physician has recommended you make the following change in your medication:  STOP Pravastatin START Rosuvastatin (Crestor) 10 mg once daily  *If you need a refill on your cardiac medications before your next appointment, please call your pharmacy*   Lab Work: Your physician recommends that you return for lab work 2-7 days prior to your Coronary CT  Your physician recommends that you return for lab work in: 3 months on April 6. You may come in anytime after 7:30 am. You will need to FAST for this appointment - nothing to eat or drink after midnight the night before except water.  If you have labs (blood work) drawn today and your tests are completely normal, you will receive your results only by: Marland Kitchen MyChart Message (if you have MyChart) OR . A paper copy in the mail If you have any lab test that is abnormal or we need to change your treatment, we will call you to review the results.    Follow-Up: At [facility], you and your health needs are our priority.  As part of our continuing mission to provide you with exceptional heart care, we have created designated Provider Care Teams.  These Care Teams include your primary Cardiologist (physician) and Advanced Practice Providers (APPs -  Physician Assistants and Nurse Practitioners) who all work together to provide you with the care you need, when you need it.   Your next appointment:   6 month(s) on Wed. June 22 at 1:20 pm  The format for your next appointment:   In Person  Provider:   You may see Mertie Moores, MD or one of the following Advanced Practice Providers on your designated Care Team:    Richardson Dopp, PA-C  Robbie Lis, [place]     Testing/Procedures: Your cardiac CT will be scheduled at one of the below locations:   [facility] [address] [place], [place] [address] [phone]  [place] [address] [place], [place] [address] [phone]  If scheduled at [facility], please arrive at the [facility] main entrance of [facility] 30 minutes prior to test start time. Proceed to the [facility] Radiology Department (first floor) to check-in and test prep.  If scheduled at [facility], please arrive 15 mins early for check-in and test prep.  Please follow these instructions carefully (unless otherwise directed):  Hold all erectile dysfunction medications at least 3 days (72 hrs) prior to test.  On the Night Before the Test: . Be sure to Drink plenty of water. . Do not consume any caffeinated/decaffeinated beverages or chocolate 12 hours prior to your test. . Do not take any antihistamines 12 hours prior to your test.  On the Day of the Test: . Drink plenty of water. Do not drink any water within one hour of the test. . Do not eat any food 4 hours prior to the test. . You may take your regular medications prior to the test.  . Take metoprolol (Lopressor) two hours prior to test. - Advised to continue Toprol 100 mg instead . HOLD Furosemide/Hydrochlorothiazide morning of the test. . FEMALES- please wear underwire-free bra if available        After the Test: . Drink plenty of water. . After receiving IV contrast, you may experience a mild flushed feeling. This is normal. . On occasion, you may experience a mild rash up to 24 hours after the  test. This is not dangerous. If this occurs, you can take Benadryl 25 mg and increase your fluid intake. . If you experience trouble breathing, this can be serious. If it is severe call 911 IMMEDIATELY. If it is mild, please call our office. . If you take any of these medications: Glipizide/Metformin, Avandament, Glucavance, please do not take 48 hours after completing test unless otherwise instructed.   Once we have confirmed authorization from your insurance company, we will call you to set up a date and  time for your test. Based on how quickly your insurance processes prior authorizations requests, please allow up to 4 weeks to be contacted for scheduling your Cardiac CT appointment. Be advised that routine Cardiac CT appointments could be scheduled as many as 8 weeks after your provider has ordered it.  For non-scheduling related questions, please contact the cardiac imaging nurse navigator should you have any questions/concerns: Marchia Bond, Cardiac Imaging Nurse Navigator Burley Saver, Interim Cardiac Imaging Nurse [place] and Vascular Services Direct Office Dial: [phone]   For scheduling needs, including cancellations and rescheduling, please call [place], [phone].

## 2020-02-26 DIAGNOSIS — Z780 Asymptomatic menopausal state: Secondary | ICD-10-CM | POA: Diagnosis not present

## 2020-02-26 DIAGNOSIS — M8589 Other specified disorders of bone density and structure, multiple sites: Secondary | ICD-10-CM | POA: Diagnosis not present

## 2020-02-26 DIAGNOSIS — Z1231 Encounter for screening mammogram for malignant neoplasm of breast: Secondary | ICD-10-CM | POA: Diagnosis not present

## 2020-03-01 ENCOUNTER — Other Ambulatory Visit: Payer: Self-pay | Admitting: Internal Medicine

## 2020-03-01 NOTE — Telephone Encounter (Signed)
Per Dr Hilarie Fredrickson, ok to fill diltiazem.

## 2020-03-17 ENCOUNTER — Ambulatory Visit (HOSPITAL_COMMUNITY)
Admission: RE | Admit: 2020-03-17 | Discharge: 2020-03-17 | Disposition: A | Discharge status: Home / Self Care | Payer: Medicare HMO | Source: Ambulatory Visit | Attending: Cardiovascular Disease | Admitting: Cardiovascular Disease

## 2020-03-17 DIAGNOSIS — I200 Unstable angina: Secondary | ICD-10-CM

## 2020-03-18 ENCOUNTER — Telehealth: Payer: Self-pay | Admitting: Nurse Practitioner

## 2020-03-18 NOTE — Telephone Encounter (Signed)
Left message with patient's husband requesting pt to call back to schedule lab appt prior to ct.

## 2020-03-18 NOTE — Telephone Encounter (Signed)
Attempted to call patient to schedule her lab appointment for bmet prior to her coronary CT on 2/22. Patient does not have voicemail, will call back later.

## 2020-03-22 NOTE — Telephone Encounter (Signed)
Patient has lab appointment scheduled for 2/18

## 2020-03-26 ENCOUNTER — Other Ambulatory Visit: Payer: Self-pay

## 2020-03-26 ENCOUNTER — Other Ambulatory Visit: Payer: Medicare HMO | Admitting: *Deleted

## 2020-03-26 DIAGNOSIS — I200 Unstable angina: Secondary | ICD-10-CM | POA: Diagnosis not present

## 2020-03-26 LAB — BASIC METABOLIC PANEL
BUN/Creatinine Ratio: 17 (ref 12–28)
BUN: 19 mg/dL (ref 8–27)
CO2: 22 mmol/L (ref 20–29)
Calcium: 9.8 mg/dL (ref 8.7–10.3)
Chloride: 101 mmol/L (ref 96–106)
Creatinine, Ser: 1.1 mg/dL — ABNORMAL HIGH (ref 0.57–1.00)
GFR calc Af Amer: 57 mL/min/{1.73_m2} — ABNORMAL LOW (ref >59)
GFR calc non Af Amer: 49 mL/min/{1.73_m2} — ABNORMAL LOW (ref >59)
Glucose: 103 mg/dL — ABNORMAL HIGH (ref 65–99)
Potassium: 4.1 mmol/L (ref 3.5–5.2)
Sodium: 139 mmol/L (ref 134–144)

## 2020-03-29 ENCOUNTER — Telehealth (HOSPITAL_COMMUNITY): Payer: Self-pay | Admitting: *Deleted

## 2020-03-29 NOTE — Telephone Encounter (Signed)
Reaching out to patient to offer assistance regarding upcoming cardiac imaging study; pt verbalizes understanding of appt date/time, parking situation and where to check in, pre-test NPO status and medications ordered, and verified current allergies; name and call back number provided for further questions should they arise  Maude Hettich RN Navigator Cardiac Imaging [facility] [phone] office [phone] cell  

## 2020-03-30 ENCOUNTER — Other Ambulatory Visit: Payer: Self-pay

## 2020-03-30 ENCOUNTER — Ambulatory Visit (HOSPITAL_COMMUNITY)
Admission: RE | Admit: 2020-03-30 | Discharge: 2020-03-30 | Disposition: A | Discharge status: Home / Self Care | Payer: Medicare HMO | Source: Ambulatory Visit | Attending: Cardiovascular Disease | Admitting: Cardiovascular Disease

## 2020-03-30 DIAGNOSIS — I200 Unstable angina: Secondary | ICD-10-CM | POA: Diagnosis not present

## 2020-03-30 DIAGNOSIS — I2510 Atherosclerotic heart disease of native coronary artery without angina pectoris: Secondary | ICD-10-CM | POA: Diagnosis not present

## 2020-03-30 MED ORDER — NITROGLYCERIN 0.4 MG SL SUBL: 0.8000 mg | SUBLINGUAL_TABLET | SUBLINGUAL | Status: DC | PRN

## 2020-03-30 MED ORDER — NITROGLYCERIN 0.4 MG SL SUBL
SUBLINGUAL_TABLET | SUBLINGUAL | Status: AC
  Filled 2020-03-30: qty 1

## 2020-03-30 MED ORDER — IOHEXOL 350 MG/ML SOLN
80.0000 mL | Freq: Once | INTRAVENOUS | Status: AC | PRN
  Administered 2020-03-30: 80 mL via INTRAVENOUS

## 2020-03-31 ENCOUNTER — Ambulatory Visit (HOSPITAL_COMMUNITY)
Admission: RE | Admit: 2020-03-31 | Discharge: 2020-03-31 | Disposition: A | Discharge status: Home / Self Care | Payer: Medicare HMO | Source: Ambulatory Visit | Attending: Cardiovascular Disease | Admitting: Cardiovascular Disease

## 2020-03-31 DIAGNOSIS — R931 Abnormal findings on diagnostic imaging of heart and coronary circulation: Secondary | ICD-10-CM | POA: Insufficient documentation

## 2020-03-31 DIAGNOSIS — I200 Unstable angina: Secondary | ICD-10-CM | POA: Diagnosis present

## 2020-03-31 DIAGNOSIS — I25110 Atherosclerotic heart disease of native coronary artery with unstable angina pectoris: Secondary | ICD-10-CM | POA: Diagnosis not present

## 2020-04-01 ENCOUNTER — Telehealth: Payer: Self-pay | Admitting: Nurse Practitioner

## 2020-04-01 DIAGNOSIS — I2510 Atherosclerotic heart disease of native coronary artery without angina pectoris: Secondary | ICD-10-CM | POA: Diagnosis not present

## 2020-04-01 NOTE — Telephone Encounter (Signed)
-----   Message from Thayer Headings, MD sent at 04/01/2020  3:37 PM EST ----- Alison Ayala has a severe prox LAD stenosis on coronary CT angio I've called her and talked to ehr  She will need an OV with an app or me next week - its been longer than a month since I saw her. She has NTG I told her to take ESASA 81  Thanks so much  [employer]

## 2020-04-01 NOTE — Telephone Encounter (Signed)
Called patient in response to message from Dr. Acie Fredrickson regarding patient needs appointment to schedule left heart cath. Patient is aware of test results and I answered additional questions to her satisfaction. She is scheduled to see Dr. Acie Fredrickson on Monday 2/28. I advised her on Covid testing that will be needed and isolation protocol. She thanked me for the call.

## 2020-04-04 ENCOUNTER — Encounter: Payer: Self-pay | Admitting: Cardiovascular Disease

## 2020-04-04 NOTE — Progress Notes (Signed)
Cardiology Office Note:    Date:  04/05/2020   ID:  Alison Ayala, DOB 12/02/1945, MRN [id number]  PCP:  Harlan Stains, MD  Cardiologist:  Acie Fredrickson   Referring MD: Harlan Stains, MD   Chief Complaint  Patient presents with  . Coronary Artery Disease  . unstable angina      Problem list 1.  Hypertension 2.  Hyperlipidemia 3.  Mitral valve prolapse 4.  Mediastinal mass  07/23/17   Alison Ayala is a 75 y.o. female with a hx of  MVP and a family hx of CAD. 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  She is never had any serious cardiac issues.  She has had heart murmur and has been diagnosed with mitral valve prolapse.  Shes been exercising at the gym for the past several years.   No CP or dyspnea.  Gets tired more easily over the past several weeks.   Needs to have her mediastinal mass removed and a lung biopsy.   Her father has a hx of CAD    October 20, 2019:  Alison Ayala is seen today for follow-up visit.  She has a history of mitral valve prolapse.  I saw her several years ago as part of a preoperative evaluation prior to removal of a mediastinal mass.  Now presents with some episodes of CP  First episode occurred after eating a BBQ sandwich. Saw GI ( [facility])  Was eventually found to have esophageal spasm Episodes seem to be related to her eating .  Has difficulty walking   Has severe episode of esophageal spasm after eating a Frosty from [employer]   Takes diltiazem QID, seems to help quite a bit  Jan. 6, 2022: [name] is seen today for follow up of her MVP and atypical cp She needs to have a GI procedure and his here partly for pre-op clearance She has mvp .  Has extensive exertional induced chest pressure.  She had a negative ETT previously  She is on cardizem 60 Q6.   She thinks the immediate release cardizem works than cardizem CD.  Her symptoms are c/w unstable angina -   Has chest pressure with any  amount of of  May be esophageal spasm but her symptoms are exercise induced.   Feb. 28, 2022 Alison Ayala is seen today for work in visit to discuss her recent coronary CT angiogram. She was seen a month ago for episodes of CP . Unstable angina . Coronary CT angio showed a significant prox LAD  Stenosis.   Coronary calcium score is  She is here to discuss cath    Past Medical History:  Diagnosis Date  . Carpal tunnel syndrome of right wrist   . Diverticulosis   . GERD (gastroesophageal reflux disease)   . H/O hiatal hernia   . H/O intraventricular hemorrhage 2004--  SPONTANEOUS   S/P SHUNT  . Heart murmur   . Hiatal hernia   . Hip pain, right OCCASIONAL -- PINCHED NERVE  . History of gastric ulcer 2001  . Hyperlipidemia   . Hypertension   . Hypoglycemia   . Internal hemorrhoids   . Interstitial lung disease ([place]) 2019   did lung test but has since improved per patient  . [place]hammer esophagus   . MVP (mitral valve prolapse)   . Normal echocardiogram 30 YRS AGO  . S/P ventriculoperitoneal shunt 2004--- PER PT SHUNT IS WORKING WELL --  NO ISSUES  . Squamous  cell cancer of scalp and skin of neck    mole back of head  . Tubular adenoma of colon     Past Surgical History:  Procedure Laterality Date  . BILATERAL FRONTAL INTERVENTRICULAR CATHETER VIA TWIST DRILL HOLE  AUG 2004   SPONTANEOUS SUBARACHNOID  HEMORRHAGE AND  INTRAVENTRICULAR HEMORRHAGE  . CARPAL TUNNEL RELEASE  07/26/2011   Procedure: CARPAL TUNNEL RELEASE;  Surgeon: Magnus Sinning, MD;  Location: [facility];  Service: Orthopedics;  Laterality: Right;  . CATARACT EXTRACTION W/ INTRAOCULAR LENS  IMPLANT, BILATERAL  2012   BILATERAL  . DUPUYTREN CONTRACTURE RELEASE  07/26/2011   Procedure: DUPUYTREN CONTRACTURE RELEASE;  Surgeon: Magnus Sinning, MD;  Location: [facility];  Service: Orthopedics;  Laterality: Right;  EXCISION OF DUPYTRENS BAND THUMB- INDEX WEB SPACE  . ESOPHAGEAL MANOMETRY  N/A 01/27/2019   Procedure: ESOPHAGEAL MANOMETRY (EM);  Surgeon: Jerene Bears, MD;  Location: [facility];  Service: Gastroenterology;  Laterality: N/A;  . EYE SURGERY Bilateral    scrapped corneas  . LUNG BIOPSY Right 08/07/2017   Procedure: Open LUNG BIOPSY;  Surgeon: Ivin Poot, MD;  Location: [facility];  Service: Thoracic;  Laterality: Right;  . RESECTION OF MEDIASTINAL MASS N/A 08/07/2017   Procedure: RESECTION OF MEDIASTINAL MASS;  Surgeon: Ivin Poot, MD;  Location: [facility];  Service: Thoracic;  Laterality: N/A;  . RETINAL DETACHMENT REPAIR W/ SCLERAL BUCKLE LE  03-10-2011   LEFT EYE  . STERNOTOMY N/A 08/07/2017   Procedure: STERNOTOMY;  Surgeon: Prescott Gum, Collier Salina, MD;  Location: [facility];  Service: Thoracic;  Laterality: N/A;  . VENTRICULOPERITONEAL SHUNT  10-08-2002   RIGHT FRONTAL FOR COMMUNICATING HYDROCEPHALUS    Current Medications: Current Meds  Medication Sig  . acetaminophen (TYLENOL) 500 MG tablet Take 1-2 tablets (500-1,000 mg total) by mouth 2 (two) times daily as needed for moderate pain or headache.  Marland Kitchen aspirin EC 81 MG tablet Take 81 mg by mouth daily. Swallow whole.  . Cholecalciferol (VITAMIN D3) 50 MCG (2000 UT) capsule Take 1 capsule by mouth daily.  Marland Kitchen diltiazem (CARDIZEM) 60 MG tablet TAKE 1 TABLET (60 MG TOTAL) BY MOUTH 4 (FOUR) TIMES DAILY.  Marland Kitchen esomeprazole (NEXIUM) 40 MG capsule Take 1 capsule (40 mg total) by mouth 2 (two) times daily before a meal.  . FLUoxetine (PROZAC) 40 MG capsule Take 40 mg by mouth daily.  . Ginger, Zingiber officinalis, (GINGER ROOT PO) Take by mouth.  . hydrochlorothiazide (HYDRODIURIL) 50 MG tablet Take 50 mg by mouth daily.  Marland Kitchen ibuprofen (ADVIL,MOTRIN) 200 MG tablet Take 400-600 mg by mouth 2 (two) times daily as needed for headache or moderate pain.  Marland Kitchen losartan (COZAAR) 50 MG tablet Take 1 tablet by mouth daily.  . metoprolol succinate (TOPROL-XL) 100 MG 24 hr tablet Take 100 mg by mouth daily. Take with or immediately following a  meal.  . nitroGLYCERIN (NITROSTAT) 0.4 MG SL tablet DISSOLVE 1 TABLET UNDER THE TONGUE ONCE FOR EPIGASTRIC PAIN/CHEST PAIN NOT RELIEVED BY PEPPERMINT ALTOIDS. IF THIS DOES NOT RESOLVE PAIN, CALL DR!  Marland Kitchen ondansetron (ZOFRAN-ODT) 4 MG disintegrating tablet TAKE 1 TABLET BY MOUTH EVERY 8 HOURS AS NEEDED FOR NAUSEA AND VOMITING  . Polyethylene Glycol 400 (BLINK TEARS OP) Place 1 drop into both eyes daily as needed (dry eyes).  . RESTASIS 0.05 % ophthalmic emulsion Place 1 drop into both eyes 2 (two) times daily.  . rosuvastatin (CRESTOR) 10 MG tablet Take 1 tablet (10 mg total)  by mouth daily.  . vitamin B-12 (CYANOCOBALAMIN) 100 MCG tablet Take 100 mcg by mouth daily.     Allergies:   Carvedilol, Claritin [loratadine], Lipitor [atorvastatin], and Lisinopril   Social History   Socioeconomic History  . Marital status: Married    Spouse name: Timmothy Sours  . Number of children: 0  . Years of education: Not on file  . Highest education level: Not on file  Occupational History  . Occupation: retired  Tobacco Use  . Smoking status: Never Smoker  . Smokeless tobacco: Never Used  Vaping Use  . Vaping Use: Never used  Substance and Sexual Activity  . Alcohol use: Yes    Alcohol/week: 2.0 standard drinks    Types: 2 Glasses of wine per week    Comment: OCCASIONAL  . Drug use: No  . Sexual activity: Not on file  Other Topics Concern  . Not on file  Social History Narrative  . Not on file   Social Determinants of Health   Financial Resource Strain: Not on file  Food Insecurity: Not on file  Transportation Needs: Not on file  Physical Activity: Not on file  Stress: Not on file  Social Connections: Not on file     Family History: The patient's family history includes Breast cancer in her maternal aunt; Colon cancer (age of onset: 68) in her mother; Colon polyps in her mother; Stomach cancer in her maternal aunt; Uterine cancer in her maternal aunt. There is no history of Rectal cancer or  Esophageal cancer.  ROS:   Please see the history of present illness.     All other systems reviewed and are negative.  EKGs/Labs/Other Studies Reviewed:      EKG: April 05, 2020: Sinus bradycardia at 55 beats a minute.  No ST or T wave changes.  Recent Labs: 03/26/2020: BUN 19; Creatinine, Ser 1.10; Potassium 4.1; Sodium 139  Recent Lipid Panel No results found for: CHOL, TRIG, HDL, CHOLHDL, VLDL, LDLCALC, LDLDIRECT  Physical Exam:    Physical Exam: Blood pressure 140/72, pulse (!) 55, height 5\' 4"  (1.626 m), weight 224 lb 6.6 oz (101.8 kg).  GEN:  Well nourished, well developed in no acute distress HEENT: Normal NECK: No JVD; No carotid bruits LYMPHATICS: No lymphadenopathy CARDIAC: RRR , no murmurs, rubs, gallops RESPIRATORY:  Clear to auscultation without rales, wheezing or rhonchi  ABDOMEN: Soft, non-tender, non-distended MUSCULOSKELETAL:  No edema; No deformity  SKIN: Warm and dry NEUROLOGIC:  Alert and oriented x 3   ECG :      ASSESSMENT:    1. Unstable angina (HCC)    PLAN:       1.  Unstable angina / CAD:    Katoya continues to have exertional angina.   She has been started on ASA, NTG.  Not much angina when she stays still and doesnot exercise  We have discussed risks, benefits, options  She understands and agrees to proceed.  .   Medication Adjustments/Labs and Tests Ordered: Current medicines are reviewed at length with the patient today.  Concerns regarding medicines are outlined above.  Orders Placed This Encounter  Procedures  . CBC  . Basic metabolic panel  . EKG 12-Lead   No orders of the defined types were placed in this encounter.    Patient Instructions  Medication Instructions:  Your physician recommends that you continue on your current medications as directed. Please refer to the Current Medication list given to you today.  *If you need a refill on  your cardiac medications before your next appointment, please call your  pharmacy*   Lab Work: TODAY: CBC, BMET If you have labs (blood work) drawn today and your tests are completely normal, you will receive your results only by: Marland Kitchen MyChart Message (if you have MyChart) OR . A paper copy in the mail If you have any lab test that is abnormal or we need to change your treatment, we will call you to review the results.   Testing/Procedures: Cardiac catheterization    Follow-Up: At [facility], you and your health needs are our priority.  As part of our continuing mission to provide you with exceptional heart care, we have created designated Provider Care Teams.  These Care Teams include your primary Cardiologist (physician) and Advanced Practice Providers (APPs -  Physician Assistants and Nurse Practitioners) who all work together to provide you with the care you need, when you need it.   Your next appointment:   05/03/20 at 3:45pm   The format for your next appointment:   In Person  Provider:   You may see Mertie Moores, MD or one of the following Advanced Practice Providers on your designated Care Team:    Richardson Dopp, PA-C  Robbie Lis, [place]    Other Instructions    [facility] [place], [place] [address] Dept: [place]: [place]  04/05/2020  You are scheduled for a Cardiac Catheterization on Thursday, March 3 with Dr. [name].  1. Please arrive at the [facility] (Main Entrance A) at [facility]: [address] [place], [place] [address] at 11:30 AM (This time is two hours before your procedure to ensure your preparation). Free valet parking service is available.   Special note: Every effort is made to have your procedure done on time. Please understand that emergencies sometimes delay scheduled procedures.  2. Diet: Do not eat solid foods after midnight.  The patient may have  clear liquids until 5am upon the day of the procedure.  3. Labs: You will need to have blood drawn on today. You do not need to be fasting.  Due to recent COVID-19 restrictions implemented by our local and state authorities and in an effort to keep both patients and staff as safe as possible, our hospital system requires COVID-19 testing prior to certain scheduled hospital procedures.  Please go to [place] [place], [place] [address] on 3/1 at 2:15pm  .  This is a drive up testing site.  You will not need to exit your vehicle.   You must agree to self-quarantine from the time of your testing until the procedure date on 3/3.  This should included staying home with ONLY the people you live with.  Avoid take-out, grocery store shopping or leaving the house for any non-emergent reason.  Failure to have your COVID-19 test done on the date and time you have been scheduled will result in cancellation of your procedure.  Please call our office at [phone] if you have any questions.  4. Medication instructions in preparation for your procedure:   Contrast Allergy: No  DO NOT TAKE Hydrochlorothiazide on the morning of your procedure.    On the morning of your procedure, take your Aspirin and any morning medicines NOT listed above.  You may use sips of water.  5. Plan for one night stay--bring personal belongings. 6. Bring a current list of your  medications and current insurance cards. 7. You MUST have a responsible person to drive you home. 8. Someone MUST be with you the first 24 hours after you arrive home or your discharge will be delayed. 9. Please wear clothes that are easy to get on and off and wear slip-on shoes.  Thank you for allowing [place] to care for you!   -- [facility] Invasive Cardiovascular services      Signed, Mertie Moores, MD  04/05/2020 5:31 PM    [place]

## 2020-04-04 NOTE — H&P (View-Only) (Signed)
Cardiology Office Note:    Date:  04/05/2020   ID:  Alison Ayala, DOB 11-03-1945, MRN [id number]  PCP:  Harlan Stains, MD  Cardiologist:  Acie Fredrickson   Referring MD: Harlan Stains, MD   Chief Complaint  Patient presents with  . Coronary Artery Disease  . unstable angina      Problem list 1.  Hypertension 2.  Hyperlipidemia 3.  Mitral valve prolapse 4.  Mediastinal mass  07/23/17   Alison Ayala is a 75 y.o. female with a hx of  MVP and a family hx of CAD. 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  She is never had any serious cardiac issues.  She has had heart murmur and has been diagnosed with mitral valve prolapse.  Shes been exercising at the gym for the past several years.   No CP or dyspnea.  Gets tired more easily over the past several weeks.   Needs to have her mediastinal mass removed and a lung biopsy.   Her father has a hx of CAD    Alison 13, 2021:  Alison Ayala is seen today for follow-up visit.  She has a history of mitral valve prolapse.  I saw her several years ago as part of a preoperative evaluation prior to removal of a mediastinal mass.  Now presents with some episodes of CP  First episode occurred after eating a BBQ sandwich. Saw GI ( [facility])  Was eventually found to have esophageal spasm Episodes seem to be related to her eating .  Has difficulty walking   Has severe episode of esophageal spasm after eating a Frosty from [facility]   Takes diltiazem QID, seems to help quite a bit  Jan. 6, 2022: Alison Ayala is seen today for follow up of her MVP and atypical cp She needs to have a GI procedure and his here partly for pre-op clearance She has mvp .  Has extensive exertional induced chest pressure.  She had a negative ETT previously  She is on cardizem 60 Q6.   She thinks the immediate release cardizem works than cardizem CD.  Her symptoms are c/w unstable angina -   Has chest pressure with any  amount of of  May be esophageal spasm but her symptoms are exercise induced.   Feb. 28, 2022 Alison Ayala is seen today for work in visit to discuss her recent coronary CT angiogram. She was seen a month ago for episodes of CP . Unstable angina . Coronary CT angio showed a significant prox LAD  Stenosis.   Coronary calcium score is  She is here to discuss cath    Past Medical History:  Diagnosis Date  . Carpal tunnel syndrome of right wrist   . Diverticulosis   . GERD (gastroesophageal reflux disease)   . H/O hiatal hernia   . H/O intraventricular hemorrhage 2004--  SPONTANEOUS   S/P SHUNT  . Heart murmur   . Hiatal hernia   . Hip pain, right OCCASIONAL -- PINCHED NERVE  . History of gastric ulcer 2001  . Hyperlipidemia   . Hypertension   . Hypoglycemia   . Internal hemorrhoids   . Interstitial lung disease ([place]) 2019   did lung test but has since improved per patient  . [place] esophagus   . MVP (mitral valve prolapse)   . Normal echocardiogram 30 YRS AGO  . S/P ventriculoperitoneal shunt 2004--- PER PT SHUNT IS WORKING WELL --  NO ISSUES  . Squamous  cell cancer of scalp and skin of neck    mole back of head  . Tubular adenoma of colon     Past Surgical History:  Procedure Laterality Date  . BILATERAL FRONTAL INTERVENTRICULAR CATHETER VIA TWIST DRILL HOLE  AUG 2004   SPONTANEOUS SUBARACHNOID  HEMORRHAGE AND  INTRAVENTRICULAR HEMORRHAGE  . CARPAL TUNNEL RELEASE  07/26/2011   Procedure: CARPAL TUNNEL RELEASE;  Surgeon: Magnus Sinning, MD;  Location: [facility];  Service: Orthopedics;  Laterality: Right;  . CATARACT EXTRACTION W/ INTRAOCULAR LENS  IMPLANT, BILATERAL  2012   BILATERAL  . DUPUYTREN CONTRACTURE RELEASE  07/26/2011   Procedure: DUPUYTREN CONTRACTURE RELEASE;  Surgeon: Magnus Sinning, MD;  Location: [facility];  Service: Orthopedics;  Laterality: Right;  EXCISION OF DUPYTRENS BAND THUMB- INDEX WEB SPACE  . ESOPHAGEAL MANOMETRY  N/A 01/27/2019   Procedure: ESOPHAGEAL MANOMETRY (EM);  Surgeon: Jerene Bears, MD;  Location: [facility];  Service: Gastroenterology;  Laterality: N/A;  . EYE SURGERY Bilateral    scrapped corneas  . LUNG BIOPSY Right 08/07/2017   Procedure: Open LUNG BIOPSY;  Surgeon: Ivin Poot, MD;  Location: [facility];  Service: Thoracic;  Laterality: Right;  . RESECTION OF MEDIASTINAL MASS N/A 08/07/2017   Procedure: RESECTION OF MEDIASTINAL MASS;  Surgeon: Ivin Poot, MD;  Location: [facility];  Service: Thoracic;  Laterality: N/A;  . RETINAL DETACHMENT REPAIR W/ SCLERAL BUCKLE LE  03-10-2011   LEFT EYE  . STERNOTOMY N/A 08/07/2017   Procedure: STERNOTOMY;  Surgeon: Prescott Gum, Collier Salina, MD;  Location: [facility];  Service: Thoracic;  Laterality: N/A;  . VENTRICULOPERITONEAL SHUNT  10-08-2002   RIGHT FRONTAL FOR COMMUNICATING HYDROCEPHALUS    Current Medications: Current Meds  Medication Sig  . acetaminophen (TYLENOL) 500 MG tablet Take 1-2 tablets (500-1,000 mg total) by mouth 2 (two) times daily as needed for moderate pain or headache.  Marland Kitchen aspirin EC 81 MG tablet Take 81 mg by mouth daily. Swallow whole.  . Cholecalciferol (VITAMIN D3) 50 MCG (2000 UT) capsule Take 1 capsule by mouth daily.  Marland Kitchen diltiazem (CARDIZEM) 60 MG tablet TAKE 1 TABLET (60 MG TOTAL) BY MOUTH 4 (FOUR) TIMES DAILY.  Marland Kitchen esomeprazole (NEXIUM) 40 MG capsule Take 1 capsule (40 mg total) by mouth 2 (two) times daily before a meal.  . FLUoxetine (PROZAC) 40 MG capsule Take 40 mg by mouth daily.  . Ginger, Zingiber officinalis, (GINGER ROOT PO) Take by mouth.  . hydrochlorothiazide (HYDRODIURIL) 50 MG tablet Take 50 mg by mouth daily.  Marland Kitchen ibuprofen (ADVIL,MOTRIN) 200 MG tablet Take 400-600 mg by mouth 2 (two) times daily as needed for headache or moderate pain.  Marland Kitchen losartan (COZAAR) 50 MG tablet Take 1 tablet by mouth daily.  . metoprolol succinate (TOPROL-XL) 100 MG 24 hr tablet Take 100 mg by mouth daily. Take with or immediately following a  meal.  . nitroGLYCERIN (NITROSTAT) 0.4 MG SL tablet DISSOLVE 1 TABLET UNDER THE TONGUE ONCE FOR EPIGASTRIC PAIN/CHEST PAIN NOT RELIEVED BY PEPPERMINT ALTOIDS. IF THIS DOES NOT RESOLVE PAIN, CALL DR!  Marland Kitchen ondansetron (ZOFRAN-ODT) 4 MG disintegrating tablet TAKE 1 TABLET BY MOUTH EVERY 8 HOURS AS NEEDED FOR NAUSEA AND VOMITING  . Polyethylene Glycol 400 (BLINK TEARS OP) Place 1 drop into both eyes daily as needed (dry eyes).  . RESTASIS 0.05 % ophthalmic emulsion Place 1 drop into both eyes 2 (two) times daily.  . rosuvastatin (CRESTOR) 10 MG tablet Take 1 tablet (10 mg total)  by mouth daily.  . vitamin B-12 (CYANOCOBALAMIN) 100 MCG tablet Take 100 mcg by mouth daily.     Allergies:   Carvedilol, Claritin [loratadine], Lipitor [atorvastatin], and Lisinopril   Social History   Socioeconomic History  . Marital status: Married    Spouse name: Timmothy Sours  . Number of children: 0  . Years of education: Not on file  . Highest education level: Not on file  Occupational History  . Occupation: retired  Tobacco Use  . Smoking status: Never Smoker  . Smokeless tobacco: Never Used  Vaping Use  . Vaping Use: Never used  Substance and Sexual Activity  . Alcohol use: Yes    Alcohol/week: 2.0 standard drinks    Types: 2 Glasses of wine per week    Comment: OCCASIONAL  . Drug use: No  . Sexual activity: Not on file  Other Topics Concern  . Not on file  Social History Narrative  . Not on file   Social Determinants of Health   Financial Resource Strain: Not on file  Food Insecurity: Not on file  Transportation Needs: Not on file  Physical Activity: Not on file  Stress: Not on file  Social Connections: Not on file     Family History: The patient's family history includes Breast cancer in her maternal aunt; Colon cancer (age of onset: 65) in her mother; Colon polyps in her mother; Stomach cancer in her maternal aunt; Uterine cancer in her maternal aunt. There is no history of Rectal cancer or  Esophageal cancer.  ROS:   Please see the history of present illness.     All other systems reviewed and are negative.  EKGs/Labs/Other Studies Reviewed:      EKG: April 05, 2020: Sinus bradycardia at 55 beats a minute.  No ST or T wave changes.  Recent Labs: 03/26/2020: BUN 19; Creatinine, Ser 1.10; Potassium 4.1; Sodium 139  Recent Lipid Panel No results found for: CHOL, TRIG, HDL, CHOLHDL, VLDL, LDLCALC, LDLDIRECT  Physical Exam:    Physical Exam: Blood pressure 140/72, pulse (!) 55, height 5\' 4"  (1.626 m), weight 224 lb 6.6 oz (101.8 kg).  GEN:  Well nourished, well developed in no acute distress HEENT: Normal NECK: No JVD; No carotid bruits LYMPHATICS: No lymphadenopathy CARDIAC: RRR , no murmurs, rubs, gallops RESPIRATORY:  Clear to auscultation without rales, wheezing or rhonchi  ABDOMEN: Soft, non-tender, non-distended MUSCULOSKELETAL:  No edema; No deformity  SKIN: Warm and dry NEUROLOGIC:  Alert and oriented x 3   ECG :      ASSESSMENT:    1. Unstable angina (HCC)    PLAN:       1.  Unstable angina / CAD:    Sweetie continues to have exertional angina.   She has been started on ASA, NTG.  Not much angina when she stays still and doesnot exercise  We have discussed risks, benefits, options  She understands and agrees to proceed.  .   Medication Adjustments/Labs and Tests Ordered: Current medicines are reviewed at length with the patient today.  Concerns regarding medicines are outlined above.  Orders Placed This Encounter  Procedures  . CBC  . Basic metabolic panel  . EKG 12-Lead   No orders of the defined types were placed in this encounter.    Patient Instructions  Medication Instructions:  Your physician recommends that you continue on your current medications as directed. Please refer to the Current Medication list given to you today.  *If you need a refill on  your cardiac medications before your next appointment, please call your  pharmacy*   Lab Work: TODAY: CBC, BMET If you have labs (blood work) drawn today and your tests are completely normal, you will receive your results only by: Marland Kitchen MyChart Message (if you have MyChart) OR . A paper copy in the mail If you have any lab test that is abnormal or we need to change your treatment, we will call you to review the results.   Testing/Procedures: Cardiac catheterization    Follow-Up: At [facility], you and your health needs are our priority.  As part of our continuing mission to provide you with exceptional heart care, we have created designated Provider Care Teams.  These Care Teams include your primary Cardiologist (physician) and Advanced Practice Providers (APPs -  Physician Assistants and Nurse Practitioners) who all work together to provide you with the care you need, when you need it.   Your next appointment:   05/03/20 at 3:45pm   The format for your next appointment:   In Person  Provider:   You may see Mertie Moores, MD or one of the following Advanced Practice Providers on your designated Care Team:    Richardson Dopp, PA-C  Robbie Lis, [place]    Other Instructions    [place] OFFICE [place], [place] [address] Dept: [place]: [place]  04/05/2020  You are scheduled for a Cardiac Catheterization on Thursday, March 3 with Dr. [name].  1. Please arrive at the [facility] (Main Entrance A) at [facility]: [address] [place], [place] [address] at 11:30 AM (This time is two hours before your procedure to ensure your preparation). Free valet parking service is available.   Special note: Every effort is made to have your procedure done on time. Please understand that emergencies sometimes delay scheduled procedures.  2. Diet: Do not eat solid foods after midnight.  The patient may have  clear liquids until 5am upon the day of the procedure.  3. Labs: You will need to have blood drawn on today. You do not need to be fasting.  Due to recent COVID-19 restrictions implemented by our local and state authorities and in an effort to keep both patients and staff as safe as possible, our hospital system requires COVID-19 testing prior to certain scheduled hospital procedures.  Please go to [address] [place], [place] [id number] on 3/1 at 2:15pm  .  This is a drive up testing site.  You will not need to exit your vehicle.   You must agree to self-quarantine from the time of your testing until the procedure date on 3/3.  This should included staying home with ONLY the people you live with.  Avoid take-out, grocery store shopping or leaving the house for any non-emergent reason.  Failure to have your COVID-19 test done on the date and time you have been scheduled will result in cancellation of your procedure.  Please call our office at [phone] if you have any questions.  4. Medication instructions in preparation for your procedure:   Contrast Allergy: No  DO NOT TAKE Hydrochlorothiazide on the morning of your procedure.    On the morning of your procedure, take your Aspirin and any morning medicines NOT listed above.  You may use sips of water.  5. Plan for one night stay--bring personal belongings. 6. Bring a current list of your  medications and current insurance cards. 7. You MUST have a responsible person to drive you home. 8. Someone MUST be with you the first 24 hours after you arrive home or your discharge will be delayed. 9. Please wear clothes that are easy to get on and off and wear slip-on shoes.  Thank you for allowing [place] to care for you!   -- [facility] Invasive Cardiovascular services      Signed, Mertie Moores, MD  04/05/2020 5:31 PM    [place]

## 2020-04-05 ENCOUNTER — Other Ambulatory Visit: Payer: Self-pay

## 2020-04-05 ENCOUNTER — Ambulatory Visit: Payer: Medicare HMO | Admitting: Cardiovascular Disease

## 2020-04-05 VITALS — BP 140/72 | HR 55 | Ht 64.0 in | Wt 224.4 lb

## 2020-04-05 DIAGNOSIS — I200 Unstable angina: Secondary | ICD-10-CM

## 2020-04-05 NOTE — Patient Instructions (Addendum)
Medication Instructions:  Your physician recommends that you continue on your current medications as directed. Please refer to the Current Medication list given to you today.  *If you need a refill on your cardiac medications before your next appointment, please call your pharmacy*   Lab Work: TODAY: CBC, BMET If you have labs (blood work) drawn today and your tests are completely normal, you will receive your results only by: Marland Kitchen MyChart Message (if you have MyChart) OR . A paper copy in the mail If you have any lab test that is abnormal or we need to change your treatment, we will call you to review the results.   Testing/Procedures: Cardiac catheterization    Follow-Up: At [facility], you and your health needs are our priority.  As part of our continuing mission to provide you with exceptional heart care, we have created designated Provider Care Teams.  These Care Teams include your primary Cardiologist (physician) and Advanced Practice Providers (APPs -  Physician Assistants and Nurse Practitioners) who all work together to provide you with the care you need, when you need it.   Your next appointment:   05/03/20 at 3:45pm   The format for your next appointment:   In Person  Provider:   You may see Mertie Moores, MD or one of the following Advanced Practice Providers on your designated Care Team:    Richardson Dopp, PA-C  Robbie Lis, [place]    Other Instructions    [place] OFFICE [place], [place] [address] Dept: [place]: [place]  04/05/2020  You are scheduled for a Cardiac Catheterization on Thursday, March 3 with Dr. [name].  1. Please arrive at the [facility] (Main Entrance A) at [facility]: [address] [place], [place] [address] at 11:30 AM (This time is two hours before your procedure to ensure your  preparation). Free valet parking service is available.   Special note: Every effort is made to have your procedure done on time. Please understand that emergencies sometimes delay scheduled procedures.  2. Diet: Do not eat solid foods after midnight.  The patient may have clear liquids until 5am upon the day of the procedure.  3. Labs: You will need to have blood drawn on today. You do not need to be fasting.  Due to recent COVID-19 restrictions implemented by our local and state authorities and in an effort to keep both patients and staff as safe as possible, our hospital system requires COVID-19 testing prior to certain scheduled hospital procedures.  Please go to [address]. [place],  [phone] on 3/1 at 2:15pm  .  This is a drive up testing site.  You will not need to exit your vehicle.   You must agree to self-quarantine from the time of your testing until the procedure date on 3/3.  This should included staying home with ONLY the people you live with.  Avoid take-out, grocery store shopping or leaving the house for any non-emergent reason.  Failure to have your COVID-19 test done on the date and time you have been scheduled will result in cancellation of your procedure.  Please call our office at [phone] if you have any questions.  4. Medication instructions in preparation for your procedure:   Contrast Allergy: No  DO NOT TAKE Hydrochlorothiazide on the morning of your procedure.    On the morning of your procedure, take  your Aspirin and any morning medicines NOT listed above.  You may use sips of water.  5. Plan for one night stay--bring personal belongings. 6. Bring a current list of your medications and current insurance cards. 7. You MUST have a responsible person to drive you home. 8. Someone MUST be with you the first 24 hours after you arrive home or your discharge will be delayed. 9. Please wear clothes that are easy to get on and off and wear slip-on  shoes.  Thank you for allowing [place] to care for you!   -- [place] In[facility] Cardiovascular services

## 2020-04-06 ENCOUNTER — Other Ambulatory Visit (HOSPITAL_COMMUNITY)
Admission: RE | Admit: 2020-04-06 | Discharge: 2020-04-06 | Disposition: A | Discharge status: Home / Self Care | Payer: Medicare HMO | Source: Ambulatory Visit | Attending: Cardiovascular Disease | Admitting: Cardiovascular Disease

## 2020-04-06 DIAGNOSIS — Z01812 Encounter for preprocedural laboratory examination: Secondary | ICD-10-CM | POA: Insufficient documentation

## 2020-04-06 DIAGNOSIS — Z20822 Contact with and (suspected) exposure to covid-19: Secondary | ICD-10-CM | POA: Insufficient documentation

## 2020-04-06 LAB — BASIC METABOLIC PANEL
BUN/Creatinine Ratio: 19 (ref 12–28)
BUN: 18 mg/dL (ref 8–27)
CO2: 23 mmol/L (ref 20–29)
Calcium: 10.3 mg/dL (ref 8.7–10.3)
Chloride: 101 mmol/L (ref 96–106)
Creatinine, Ser: 0.94 mg/dL (ref 0.57–1.00)
Glucose: 106 mg/dL — ABNORMAL HIGH (ref 65–99)
Potassium: 4.2 mmol/L (ref 3.5–5.2)
Sodium: 141 mmol/L (ref 134–144)
eGFR: 63 mL/min/{1.73_m2} (ref >59)

## 2020-04-06 LAB — CBC
Hematocrit: 43.2 % (ref 34.0–46.6)
Hemoglobin: 14.3 g/dL (ref 11.1–15.9)
MCH: 31.2 pg (ref 26.6–33.0)
MCHC: 33.1 g/dL (ref 31.5–35.7)
MCV: 94 fL (ref 79–97)
Platelets: 257 10*3/uL (ref 150–450)
RBC: 4.59 x10E6/uL (ref 3.77–5.28)
RDW: 13 % (ref 11.7–15.4)
WBC: 6.8 10*3/uL (ref 3.4–10.8)

## 2020-04-06 LAB — SARS CORONAVIRUS 2 (TAT 6-24 HRS): SARS Coronavirus 2: NEGATIVE

## 2020-04-07 ENCOUNTER — Telehealth: Payer: Self-pay | Admitting: *Deleted

## 2020-04-07 NOTE — Telephone Encounter (Signed)
Pt contacted pre-catheterization scheduled at [facility] for: Thursday April 08, 2020 1:30 PM Verified arrival time and place: [facility]) at: 11:30 AM   No solid food after midnight prior to cath, clear liquids until 5 AM day of procedure.  Hold: HCTZ-AM of procedure  Except hold medications AM meds can be  taken pre-cath with sips of water including: ASA 81 mg   Confirmed patient has responsible adult to drive home post procedure and be with patient first 24 hours after arriving home: yes  You are allowed ONE visitor in the waiting room during the time you are at the hospital for your procedure. Both you and your visitor must wear a mask once you enter the hospital.  Reviewed procedure/mask/visitor instructions with patient.

## 2020-04-08 ENCOUNTER — Other Ambulatory Visit: Payer: Self-pay

## 2020-04-08 ENCOUNTER — Other Ambulatory Visit: Payer: Self-pay | Admitting: Cardiology

## 2020-04-08 ENCOUNTER — Other Ambulatory Visit: Payer: Self-pay | Admitting: Physician Assistant

## 2020-04-08 ENCOUNTER — Encounter (HOSPITAL_COMMUNITY)
Admission: RE | Disposition: A | Discharge status: Home / Self Care | Payer: Self-pay | Source: Home / Self Care | Attending: Cardiovascular Disease

## 2020-04-08 ENCOUNTER — Ambulatory Visit (HOSPITAL_COMMUNITY)
Admission: RE | Admit: 2020-04-08 | Discharge: 2020-04-09 | Disposition: A | Discharge status: Home / Self Care | Payer: Medicare HMO | Attending: Cardiovascular Disease | Admitting: Cardiovascular Disease

## 2020-04-08 ENCOUNTER — Encounter (HOSPITAL_COMMUNITY): Payer: Self-pay | Admitting: Cardiovascular Disease

## 2020-04-08 DIAGNOSIS — E785 Hyperlipidemia, unspecified: Secondary | ICD-10-CM | POA: Diagnosis not present

## 2020-04-08 DIAGNOSIS — Z888 Allergy status to other drugs, medicaments and biological substances status: Secondary | ICD-10-CM | POA: Diagnosis not present

## 2020-04-08 DIAGNOSIS — I10 Essential (primary) hypertension: Secondary | ICD-10-CM | POA: Diagnosis not present

## 2020-04-08 DIAGNOSIS — I341 Nonrheumatic mitral (valve) prolapse: Secondary | ICD-10-CM | POA: Insufficient documentation

## 2020-04-08 DIAGNOSIS — Z85820 Personal history of malignant melanoma of skin: Secondary | ICD-10-CM | POA: Insufficient documentation

## 2020-04-08 DIAGNOSIS — J849 Interstitial pulmonary disease, unspecified: Secondary | ICD-10-CM | POA: Diagnosis present

## 2020-04-08 DIAGNOSIS — Z8711 Personal history of peptic ulcer disease: Secondary | ICD-10-CM | POA: Diagnosis not present

## 2020-04-08 DIAGNOSIS — Z8049 Family history of malignant neoplasm of other genital organs: Secondary | ICD-10-CM | POA: Insufficient documentation

## 2020-04-08 DIAGNOSIS — I2510 Atherosclerotic heart disease of native coronary artery without angina pectoris: Secondary | ICD-10-CM | POA: Diagnosis present

## 2020-04-08 DIAGNOSIS — Z955 Presence of coronary angioplasty implant and graft: Secondary | ICD-10-CM

## 2020-04-08 DIAGNOSIS — Z79899 Other long term (current) drug therapy: Secondary | ICD-10-CM | POA: Diagnosis not present

## 2020-04-08 DIAGNOSIS — Z800 Family history of malignant neoplasm of digestive organs: Secondary | ICD-10-CM | POA: Diagnosis not present

## 2020-04-08 DIAGNOSIS — I25110 Atherosclerotic heart disease of native coronary artery with unstable angina pectoris: Secondary | ICD-10-CM

## 2020-04-08 DIAGNOSIS — I200 Unstable angina: Secondary | ICD-10-CM | POA: Diagnosis present

## 2020-04-08 DIAGNOSIS — Z7982 Long term (current) use of aspirin: Secondary | ICD-10-CM | POA: Insufficient documentation

## 2020-04-08 DIAGNOSIS — Z85038 Personal history of other malignant neoplasm of large intestine: Secondary | ICD-10-CM | POA: Insufficient documentation

## 2020-04-08 DIAGNOSIS — Z803 Family history of malignant neoplasm of breast: Secondary | ICD-10-CM | POA: Insufficient documentation

## 2020-04-08 DIAGNOSIS — Z8249 Family history of ischemic heart disease and other diseases of the circulatory system: Secondary | ICD-10-CM | POA: Diagnosis not present

## 2020-04-08 HISTORY — PX: LEFT HEART CATH AND CORONARY ANGIOGRAPHY: CATH118249

## 2020-04-08 HISTORY — PX: CORONARY STENT INTERVENTION: CATH118234

## 2020-04-08 HISTORY — PX: INTRAVASCULAR IMAGING/OCT: CATH118326

## 2020-04-08 LAB — POCT ACTIVATED CLOTTING TIME
Activated Clotting Time: 297 seconds
Activated Clotting Time: 303 seconds

## 2020-04-08 SURGERY — LEFT HEART CATH AND CORONARY ANGIOGRAPHY
Anesthesia: LOCAL

## 2020-04-08 MED ORDER — FENTANYL CITRATE (PF) 100 MCG/2ML IJ SOLN
INTRAMUSCULAR | Status: AC
  Filled 2020-04-08: qty 2

## 2020-04-08 MED ORDER — FENTANYL CITRATE (PF) 100 MCG/2ML IJ SOLN
INTRAMUSCULAR | Status: DC | PRN
  Administered 2020-04-08: 25 ug via INTRAVENOUS

## 2020-04-08 MED ORDER — NITROGLYCERIN 0.4 MG SL SUBL: 0.4000 mg | SUBLINGUAL_TABLET | SUBLINGUAL | Status: DC | PRN

## 2020-04-08 MED ORDER — HYDRALAZINE HCL 25 MG PO TABS
25.0000 mg | ORAL_TABLET | Freq: Once | ORAL | Status: AC
  Administered 2020-04-08: 25 mg via ORAL
  Filled 2020-04-08: qty 1

## 2020-04-08 MED ORDER — ACETAMINOPHEN 500 MG PO TABS
500.0000 mg | ORAL_TABLET | Freq: Two times a day (BID) | ORAL | Status: DC | PRN
  Filled 2020-04-08: qty 2

## 2020-04-08 MED ORDER — NITROGLYCERIN 1 MG/10 ML FOR IR/CATH LAB
INTRA_ARTERIAL | Status: AC
  Filled 2020-04-08: qty 10

## 2020-04-08 MED ORDER — SODIUM CHLORIDE 0.9 % IV SOLN: INTRAVENOUS | Status: AC

## 2020-04-08 MED ORDER — SODIUM CHLORIDE 0.9% FLUSH: 3.0000 mL | INTRAVENOUS | Status: DC | PRN

## 2020-04-08 MED ORDER — ONDANSETRON 4 MG PO TBDP
4.0000 mg | ORAL_TABLET | Freq: Three times a day (TID) | ORAL | Status: DC | PRN
  Filled 2020-04-08: qty 1

## 2020-04-08 MED ORDER — ASPIRIN 81 MG PO CHEW: 81.0000 mg | CHEWABLE_TABLET | Freq: Every day | ORAL | Status: DC

## 2020-04-08 MED ORDER — ASPIRIN 81 MG PO CHEW: 81.0000 mg | CHEWABLE_TABLET | ORAL | Status: DC

## 2020-04-08 MED ORDER — SODIUM CHLORIDE 0.9 % WEIGHT BASED INFUSION: 1.0000 mL/kg/h | INTRAVENOUS | Status: DC

## 2020-04-08 MED ORDER — VERAPAMIL HCL 2.5 MG/ML IV SOLN
INTRAVENOUS | Status: AC
  Filled 2020-04-08: qty 2

## 2020-04-08 MED ORDER — TICAGRELOR 90 MG PO TABS
90.0000 mg | ORAL_TABLET | Freq: Two times a day (BID) | ORAL | Status: DC
  Administered 2020-04-08 – 2020-04-09 (×2): 90 mg via ORAL
  Filled 2020-04-08 (×3): qty 1

## 2020-04-08 MED ORDER — SODIUM CHLORIDE 0.9% FLUSH
3.0000 mL | Freq: Two times a day (BID) | INTRAVENOUS | Status: DC
  Administered 2020-04-08: 3 mL via INTRAVENOUS

## 2020-04-08 MED ORDER — NITROGLYCERIN 1 MG/10 ML FOR IR/CATH LAB
INTRA_ARTERIAL | Status: DC | PRN
  Administered 2020-04-08: 200 ug via INTRACORONARY

## 2020-04-08 MED ORDER — TICAGRELOR 90 MG PO TABS: 90.0000 mg | ORAL_TABLET | Freq: Two times a day (BID) | ORAL | 0 refills | Status: DC

## 2020-04-08 MED ORDER — SODIUM CHLORIDE 0.9 % IV SOLN: 250.0000 mL | INTRAVENOUS | Status: DC | PRN

## 2020-04-08 MED ORDER — MIDAZOLAM HCL 2 MG/2ML IJ SOLN
INTRAMUSCULAR | Status: DC | PRN
  Administered 2020-04-08: 1 mg via INTRAVENOUS

## 2020-04-08 MED ORDER — HEPARIN SODIUM (PORCINE) 1000 UNIT/ML IJ SOLN
INTRAMUSCULAR | Status: DC | PRN
  Administered 2020-04-08: 5000 [IU] via INTRAVENOUS
  Administered 2020-04-08: 6000 [IU] via INTRAVENOUS
  Administered 2020-04-08: 2000 [IU] via INTRAVENOUS

## 2020-04-08 MED ORDER — LABETALOL HCL 5 MG/ML IV SOLN: 10.0000 mg | INTRAVENOUS | Status: AC | PRN

## 2020-04-08 MED ORDER — ACETAMINOPHEN 325 MG PO TABS: 650.0000 mg | ORAL_TABLET | ORAL | Status: DC | PRN

## 2020-04-08 MED ORDER — DILTIAZEM HCL 60 MG PO TABS
60.0000 mg | ORAL_TABLET | Freq: Four times a day (QID) | ORAL | Status: DC
  Administered 2020-04-09: 60 mg via ORAL
  Filled 2020-04-08 (×3): qty 1

## 2020-04-08 MED ORDER — HEPARIN SODIUM (PORCINE) 1000 UNIT/ML IJ SOLN
INTRAMUSCULAR | Status: AC
  Filled 2020-04-08: qty 1

## 2020-04-08 MED ORDER — TICAGRELOR 90 MG PO TABS
ORAL_TABLET | ORAL | Status: AC
  Filled 2020-04-08: qty 2

## 2020-04-08 MED ORDER — ONDANSETRON HCL 4 MG/2ML IJ SOLN: 4.0000 mg | Freq: Four times a day (QID) | INTRAMUSCULAR | Status: DC | PRN

## 2020-04-08 MED ORDER — HEPARIN (PORCINE) IN NACL 1000-0.9 UT/500ML-% IV SOLN
INTRAVENOUS | Status: DC | PRN
  Administered 2020-04-08 (×2): 500 mL

## 2020-04-08 MED ORDER — LIDOCAINE HCL (PF) 1 % IJ SOLN
INTRAMUSCULAR | Status: AC
  Filled 2020-04-08: qty 30

## 2020-04-08 MED ORDER — LIDOCAINE HCL (PF) 1 % IJ SOLN
INTRAMUSCULAR | Status: DC | PRN
  Administered 2020-04-08: 2 mL

## 2020-04-08 MED ORDER — ROSUVASTATIN CALCIUM 10 MG PO TABS: 20.0000 mg | ORAL_TABLET | Freq: Every day | ORAL | 3 refills | Status: DC

## 2020-04-08 MED ORDER — PANTOPRAZOLE SODIUM 40 MG PO TBEC
40.0000 mg | DELAYED_RELEASE_TABLET | Freq: Every day | ORAL | Status: DC
  Administered 2020-04-09: 40 mg via ORAL
  Filled 2020-04-08 (×2): qty 1

## 2020-04-08 MED ORDER — SODIUM CHLORIDE 0.9 % WEIGHT BASED INFUSION
3.0000 mL/kg/h | INTRAVENOUS | Status: AC
  Administered 2020-04-08: 3 mL/kg/h via INTRAVENOUS

## 2020-04-08 MED ORDER — FLUOXETINE HCL 20 MG PO CAPS
40.0000 mg | ORAL_CAPSULE | Freq: Every day | ORAL | Status: DC
  Administered 2020-04-09: 40 mg via ORAL
  Filled 2020-04-08 (×2): qty 2

## 2020-04-08 MED ORDER — HYDRALAZINE HCL 20 MG/ML IJ SOLN: 10.0000 mg | INTRAMUSCULAR | Status: AC | PRN

## 2020-04-08 MED ORDER — HYPROMELLOSE (GONIOSCOPIC) 2.5 % OP SOLN: 1.0000 [drp] | Freq: Two times a day (BID) | OPHTHALMIC | Status: DC | PRN

## 2020-04-08 MED ORDER — MIDAZOLAM HCL 2 MG/2ML IJ SOLN
INTRAMUSCULAR | Status: AC
  Filled 2020-04-08: qty 2

## 2020-04-08 MED ORDER — SODIUM CHLORIDE 0.9% FLUSH: 3.0000 mL | Freq: Two times a day (BID) | INTRAVENOUS | Status: DC

## 2020-04-08 MED ORDER — VERAPAMIL HCL 2.5 MG/ML IV SOLN
INTRAVENOUS | Status: DC | PRN
  Administered 2020-04-08: 10 mL via INTRA_ARTERIAL

## 2020-04-08 MED ORDER — HEPARIN (PORCINE) IN NACL 1000-0.9 UT/500ML-% IV SOLN
INTRAVENOUS | Status: AC
  Filled 2020-04-08: qty 1000

## 2020-04-08 MED ORDER — IOHEXOL 350 MG/ML SOLN
INTRAVENOUS | Status: DC | PRN
  Administered 2020-04-08: 175 mL

## 2020-04-08 MED ORDER — METOPROLOL SUCCINATE ER 50 MG PO TB24
50.0000 mg | ORAL_TABLET | Freq: Every day | ORAL | Status: DC
  Administered 2020-04-09: 50 mg via ORAL
  Filled 2020-04-08 (×2): qty 1

## 2020-04-08 MED ORDER — HYDROCHLOROTHIAZIDE 50 MG PO TABS
50.0000 mg | ORAL_TABLET | Freq: Once | ORAL | Status: AC
  Administered 2020-04-08: 50 mg via ORAL
  Filled 2020-04-08: qty 2
  Filled 2020-04-08: qty 1

## 2020-04-08 MED ORDER — TICAGRELOR 90 MG PO TABS
ORAL_TABLET | ORAL | Status: DC | PRN
  Administered 2020-04-08: 180 mg via ORAL

## 2020-04-08 MED ORDER — ASPIRIN EC 81 MG PO TBEC
81.0000 mg | DELAYED_RELEASE_TABLET | Freq: Every day | ORAL | Status: DC
  Administered 2020-04-09: 81 mg via ORAL
  Filled 2020-04-08 (×2): qty 1

## 2020-04-08 MED ORDER — LOSARTAN POTASSIUM 50 MG PO TABS
100.0000 mg | ORAL_TABLET | Freq: Every day | ORAL | Status: DC
  Administered 2020-04-09: 100 mg via ORAL
  Filled 2020-04-08 (×2): qty 2

## 2020-04-08 MED FILL — BRILINTA 90 MG TABLET: 90 | 30 days supply | Qty: 60 | Fill #0

## 2020-04-08 SURGICAL SUPPLY — 22 items
BALLN SAPPHIRE 2.5X15 (BALLOONS) ×2
BALLN SAPPHIRE ~~LOC~~ 4.0X8 (BALLOONS) ×1 IMPLANT
BALLOON SAPPHIRE 2.5X15 (BALLOONS) IMPLANT
CATH 5FR JL3.5 JR4 ANG PIG MP (CATHETERS) ×1 IMPLANT
CATH DRAGONFLY OPSTAR (CATHETERS) ×1 IMPLANT
CATH VISTA GUIDE 6FR XBLAD3.5 (CATHETERS) ×1 IMPLANT
DEVICE RAD COMP TR BAND LRG (VASCULAR PRODUCTS) ×1 IMPLANT
ELECT DEFIB PAD ADLT CADENCE (PAD) ×1 IMPLANT
GLIDESHEATH SLEND SS 6F .021 (SHEATH) ×1 IMPLANT
GUIDEWIRE INQWIRE 1.5J.035X260 (WIRE) IMPLANT
INQWIRE 1.5J .035X260CM (WIRE) ×2
KIT ENCORE 26 ADVANTAGE (KITS) ×1 IMPLANT
KIT HEART LEFT (KITS) ×2 IMPLANT
PACK CARDIAC CATHETERIZATION (CUSTOM PROCEDURE TRAY) ×2 IMPLANT
SHEATH PROBE COVER 6X72 (BAG) ×1 IMPLANT
STENT SYNERGY XD 4.0X16 (Permanent Stent) IMPLANT
SYNERGY XD 4.0X16 (Permanent Stent) ×2 IMPLANT
SYR MEDRAD MARK 7 150ML (SYRINGE) ×2 IMPLANT
TRANSDUCER W/STOPCOCK (MISCELLANEOUS) ×2 IMPLANT
TUBING CIL FLEX 10 FLL-RA (TUBING) ×2 IMPLANT
TUBING CONTRAST HIGH PRESS 20 (MISCELLANEOUS) ×1 IMPLANT
WIRE COUGAR XT STRL 190CM (WIRE) ×2 IMPLANT

## 2020-04-08 NOTE — Interval H&P Note (Signed)
History and Physical Interval Note:  04/08/2020 12:14 PM  Alison Ayala  has presented today for surgery, with the diagnosis of [place].  The various methods of treatment have been discussed with the patient and family. After consideration of risks, benefits and other options for treatment, the patient has consented to  Procedure(s): LEFT HEART CATH AND CORONARY ANGIOGRAPHY (N/A) as a surgical intervention.  The patient's history has been reviewed, patient examined, no change in status, stable for surgery.  I have reviewed the patient's chart and labs.  Questions were answered to the patient's satisfaction.    Cath Lab Visit (complete for each Cath Lab visit)  Clinical Evaluation Leading to the Procedure:   ACS: No.  Non-ACS:    Anginal Classification: CCS III  Anti-ischemic medical therapy: Maximal Therapy (2 or more classes of medications)  Non-Invasive Test Results: High-risk stress test findings: cardiac mortality >3%/year (Severe stenosis ostial LAD on CTA)  Prior CABG: No previous CABG        Alison Ayala

## 2020-04-08 NOTE — Progress Notes (Signed)
Swelling noted right radial and pressure held x 32min by Page,RN and site soft

## 2020-04-08 NOTE — Discharge Summary (Addendum)
The patient has been seen in conjunction with Vin Bhagat, PAC. All aspects of care have been considered and discussed. The patient has been personally interviewed, examined, and all clinical data has been reviewed.   Has ambulated and symptoms have completely resolved.  In retrospect, she feels that much of her "GERD" symptoms were cardiac.  Images were reviewed.  Radial cath site is unremarkable.  There is mild ecchymosis but no hematoma.  Plan discharge today and will require long-term dual antiplatelet therapy for at least 1 year followed by P2 Y 12 monotherapy thereafter.  Aggressive risk factor modification including consideration of cardiac rehab will be important.  Discharge Summary for Same Day PCI   Patient ID: Alison Ayala MRN: [id number]; DOB: 05/15/1945  Admit date: 04/08/2020 Discharge date: 04/09/2020  Primary Care Provider: Harlan Stains, MD  Primary Cardiologist: Mertie Moores, MD  Primary Electrophysiologist:  None   Discharge Diagnoses    Principal Problem:   Unstable angina [facility]) Active Problems:   Essential hypertension   ILD (interstitial lung disease) ([place])   Hyperlipidemia LDL goal <70   CAD (coronary artery disease)   CAD in native artery   Radial hematoma   Diagnostic Studies/Procedures    Cardiac Catheterization 04/09/2020:   Ost LAD to Prox LAD lesion is 99% stenosed.  A drug-eluting stent was successfully placed using a SYNERGY XD 4.0X16.  Post intervention, there is a 0% residual stenosis.  The left ventricular systolic function is normal.  LV end diastolic pressure is normal.  The left ventricular ejection fraction is 55-65% by visual estimate.  There is no mitral valve regurgitation.   1. Severe ostial/proximal LAD stenosis 2. Successful PTCA/DES x 1 ostial/proximal LAD with optimization with OCT imaging 3. No obstructive disease in the large dominant Circumflex artery.  4. Small non-dominant RCA 5. Normal LV systolic  function  Recommendations: Continue DAPT with ASA and Brilinta for at least six months but preferably one year if she tolerates it well. If necessary, the Brilinta could be changed to Plavix in 3 months. Same day post PCI discharge.   Diagnostic Dominance: Left    Intervention        History of Present Illness     Alison Ayala is a 75 y.o. female with PMH of MVP, HTN, HLD, GERD, skin CA who presented to the office with Dr. Acie Fredrickson on 04/05/20. Had a recent CCTa which was abnormal. Had been having episodes of unstable angina. CCTa with significant pLAD stenosis. Given findings she was set up for outpatient cardiac cath.  Hospital Course     The patient underwent cardiac cath as noted above with ostial/pLAD with 99% stenosis. Successful PCI/DES x1. Plan for DAPT with ASA/Brilinta for at least 6 months but prefer a year if able to tolerate. Post cath patient has right radial hematoma and kept overnight. Hematoma improved. Dyspnea after taking Brillinta while improved with caffeine intake. Brillinta cost if $45/month. Will let us know if cost is an issue. Creator increase to 20mg  qd.   Alison Ayala was seen by Dr. Tamala Julian and determined stable for discharge home. Follow up with our office has been arranged. Medications are listed below.   Cath/PCI Registry Performance & Quality Measures: 1. Aspirin prescribed? - Yes 2. ADP Receptor Inhibitor (Plavix/Clopidogrel, Brilinta/Ticagrelor or Effient/Prasugrel) prescribed (includes medically managed patients)? - Yes 3. High Intensity Statin (Lipitor 40-80mg  or Crestor 20-40mg ) prescribed? - Yes 4. For EF <40%, was ACEI/ARB prescribed? - Not Applicable (EF >/= 58%) 5. For  EF <40%, Aldosterone Antagonist (Spironolactone or Eplerenone) prescribed? - Not Applicable (EF >/= 75%) 6. Cardiac Rehab Phase II ordered (Included Medically managed Patients)? - Yes   Discharge Vitals Blood pressure (!) 166/61, pulse 60, temperature 97.8 F (36.6  C), temperature source Oral, resp. rate 20, height 5\' 4"  (1.626 m), weight 100.8 kg, SpO2 98 %.  Filed Weights   04/08/20 1137 04/08/20 2115 04/09/20 0500  Weight: 99.8 kg 100.8 kg 100.8 kg   Physical Exam Constitutional:      Appearance: Normal appearance.  HENT:     Head: Normocephalic.     Nose: Nose normal.  Eyes:     Pupils: Pupils are equal, round, and reactive to light.  Cardiovascular:     Rate and Rhythm: Normal rate and regular rhythm.     Pulses: Normal pulses.     Comments: Right radical cath site with mild hematoma  Pulmonary:     Effort: Pulmonary effort is normal.     Breath sounds: Normal breath sounds.  Abdominal:     General: Abdomen is flat. Bowel sounds are normal.     Palpations: Abdomen is soft.  Musculoskeletal:        General: Normal range of motion.  Skin:    General: Skin is warm and dry.  Neurological:     General: No focal deficit present.     Mental Status: She is alert and oriented to person, place, and time.  Psychiatric:        Mood and Affect: Mood normal.        Behavior: Behavior normal.    Last Labs & Radiologic Studies    CBC Recent Labs    04/09/20 0314  WBC 7.2  HGB 12.6  HCT 38.6  MCV 95.5  PLT 643   Basic Metabolic Panel Recent Labs    04/09/20 0314  NA 137  K 3.9  CL 103  CO2 25  GLUCOSE 112*  BUN 15  CREATININE 0.80  CALCIUM 9.8  _____________  CARDIAC CATHETERIZATION  Result Date: 04/08/2020  Ost LAD to Prox LAD lesion is 99% stenosed.  A drug-eluting stent was successfully placed using a SYNERGY XD 4.0X16.  Post intervention, there is a 0% residual stenosis.  The left ventricular systolic function is normal.  LV end diastolic pressure is normal.  The left ventricular ejection fraction is 55-65% by visual estimate.  There is no mitral valve regurgitation.  1. Severe ostial/proximal LAD stenosis 2. Successful PTCA/DES x 1 ostial/proximal LAD with optimization with OCT imaging 3. No obstructive disease in  the large dominant Circumflex artery. 4. Small non-dominant RCA 5. Normal LV systolic function Recommendations: Continue DAPT with ASA and Brilinta for at least six months but preferably one year if she tolerates it well. If necessary, the Brilinta could be changed to Plavix in 3 months. Same day post PCI discharge.   CT CORONARY MORPH W/CTA COR W/SCORE W/CA W/CM &/OR WO/CM  Addendum Date: 03/31/2020   ADDENDUM REPORT: 03/31/2020 12:33 CLINICAL DATA:  89F with hypertension, hyperlipidemia, mitral valve prolapse and a mediastinal mass for preoperative risk assessment. EXAM: Cardiac/Coronary  CT TECHNIQUE: The patient was scanned on a [employer]. FINDINGS: A 120 kV prospective scan was triggered in the descending thoracic aorta at 111 HU's. Axial non-contrast 3 mm slices were carried out through the heart. The data set was analyzed on a dedicated work station and scored using the [place]. Gantry rotation speed was 250 msecs and collimation was .6 mm. No  beta blockade and 0.8 mg of sl NTG was given. The 3D data set was reconstructed in 5% intervals of the 67-82 % of the R-R cycle. Diastolic phases were analyzed on a dedicated work station using MPR, MIP and VRT modes. The patient received 80 cc of contrast. Aorta: Normal size. Calcification of the aortic root and descending aorta. No dissection. Aortic Valve:  Trileaflet.  No calcifications. Coronary Arteries:  Normal coronary origin.  Right dominance. RCA is a small, non-dominant artery.  There is no plaque. Left main is a large artery that gives rise to LAD, RI, and LCX arteries. Minimal (<25%) calcified plaque. LAD is a large vessel that has severe (>70%) soft plaque at the ostium. There is a tiny D1 that is diffusely diseased. D2 and D3 are small vessels without significant disease. LCX is a dominant artery that gives rise to one large OM1 branch and L-PDA. OM1 tapers abruptly and may be occluded but is difficult to evaluate. There is no  significant disease in the LCX or L-PDA. Other findings: Normal pulmonary vein drainage into the left atrium. Normal let atrial appendage without a thrombus. Normal size of the pulmonary artery. Mild mitral annular calcification. IMPRESSION: 1. Coronary calcium score of 0.687. This was 26th percentile for age and sex matched control. 2. Normal coronary origin with left dominance. 3. There is severe (>70%) soft plaque in the ostial LAD. CAD-RADS 4. 4.  Will send for FFRct. Skeet Latch, MD Electronically Signed   By: Skeet Latch   On: 03/31/2020 12:33   Result Date: 03/31/2020 EXAM: OVER-READ INTERPRETATION  CT CHEST The following report is an over-read performed by radiologist Dr. Vinnie Langton of [facility], [place] on 03/30/2020. This over-read does not include interpretation of cardiac or coronary anatomy or pathology. The coronary calcium score/coronary CTA interpretation by the cardiologist is attached. COMPARISON:  None. FINDINGS: Aortic atherosclerosis. Moderate hiatal hernia. Dependent areas of subsegmental atelectasis in the right lower lobe. Within the visualized portions of the thorax there are no suspicious appearing pulmonary nodules or masses, there is no acute consolidative airspace disease, no pleural effusions, no pneumothorax and no lymphadenopathy. Visualized portions of the upper abdomen are unremarkable. There are no aggressive appearing lytic or blastic lesions noted in the visualized portions of the skeleton. Median sternotomy wires. VP shunt catheter in the subcutaneous fat of the right anterior chest wall incidentally noted. IMPRESSION: 1.  Aortic Atherosclerosis (ICD10-I70.0). 2. Moderate-sized hiatal hernia. Electronically Signed: By: Vinnie Langton M.D. On: 03/30/2020 13:25   CT CORONARY FRACTIONAL FLOW RESERVE DATA PREP  Result Date: 04/01/2020 EXAM: FFRCT ANALYSIS FINDINGS: FFRct analysis was performed on the original cardiac CT angiogram dataset. Diagrammatic  representation of the FFRct analysis is provided in a separate PDF document in PACS. This dictation was created using the PDF document and an interactive 3D model of the results. 3D model is not available in the EMR/PACS. Normal FFR range is >0.80. 1. Left Main: FFRct 0.99. 2. LAD: Ostial LAD FFRct 0.96.  Proximal FFRct 0.73.  Mid FFRct 0.67 3. LCX: FFRct 0.98 proximal, 0.96 mid, 0.94 distal. OM1 FFRct 0.94. OM2 FFRct 0.88. L-PDA FFRct 0.57. 4. RCA: FFRct 0.93. IMPRESSION: 1. FFRct findings are consistent with severe stenosis in the ostial LAD. 2. FFRct findings are consistent with severe stenosis in the L-PDA. On comparison with the coronary CT-A, this region appears small and tortuous, but not severely stenotic. 3.  Recommend cardiac catheterization. Tiffany C. Oval Linsey, MD Electronically Signed   By: Jonelle Sidle  Oval Linsey   On: 04/01/2020 15:35    Disposition   Pt is being discharged home today in good condition.  Follow-up Plans & Appointments     Follow-up Information    Liliane Shi, PA-C Follow up on 05/03/2020.   Specialties: Cardiology, Physician Assistant Why: 3:45PM. Cardiology visit Contact information: [address] [place] [address] [phone]              Discharge Instructions    Diet - low sodium heart healthy   Complete by: As directed    Diet - low sodium heart healthy   Complete by: As directed    Diet - low sodium heart healthy   Complete by: As directed    Discharge instructions   Complete by: As directed    No lifting over 5 lbs for 1 week. No sexual activity for 1 week. Keep procedure site clean & dry. If you notice increased pain, swelling, bleeding or pus, call/return!  You may shower, but no soaking baths/hot tubs/pools for 1 week.   Discharge instructions   Complete by: As directed    No driving for 72 hours. No lifting over 5 lbs for 7 days. No sexual activity for 1 week. Keep procedure site clean & dry. If you notice increased  pain, swelling, bleeding or pus, call/return!  You may shower, but no soaking baths/hot tubs/pools for 1 week.   Increase activity slowly   Complete by: As directed    Increase activity slowly   Complete by: As directed    Increase activity slowly   Complete by: As directed        Discharge Medications   Allergies as of 04/09/2020      Reactions   Carvedilol Palpitations, Other (See Comments)   Abdominal bloating   Claritin [loratadine] Other (See Comments)   Blurred vision and dry eyes   Lipitor [atorvastatin]    fatigue   Lisinopril Other (See Comments)   "drunk feeling"      Medication List    TAKE these medications   acetaminophen 500 MG tablet Commonly known as: TYLENOL Take 1-2 tablets (500-1,000 mg total) by mouth 2 (two) times daily as needed for moderate pain or headache.   aspirin EC 81 MG tablet Take 81 mg by mouth daily. Swallow whole.   carboxymethylcellulose 0.5 % Soln Commonly known as: REFRESH PLUS Place 1 drop into both eyes 2 (two) times daily as needed (dry eyes).   diltiazem 60 MG tablet Commonly known as: CARDIZEM TAKE 1 TABLET (60 MG TOTAL) BY MOUTH 4 (FOUR) TIMES DAILY.   esomeprazole 40 MG capsule Commonly known as: NexIUM Take 1 capsule (40 mg total) by mouth 2 (two) times daily before a meal. What changed:   when to take this  additional instructions   FLUoxetine 40 MG capsule Commonly known as: PROZAC Take 40 mg by mouth daily.   hydrochlorothiazide 50 MG tablet Commonly known as: HYDRODIURIL Take 50 mg by mouth daily.   ibuprofen 200 MG tablet Commonly known as: ADVIL Take 400-600 mg by mouth 2 (two) times daily as needed for headache or moderate pain.   losartan 100 MG tablet Commonly known as: COZAAR Take 100 mg by mouth daily.   metoprolol succinate 50 MG 24 hr tablet Commonly known as: TOPROL-XL Take 50 mg by mouth daily. Take with or immediately following a meal.   nitroGLYCERIN 0.4 MG SL tablet Commonly known as:  NITROSTAT DISSOLVE 1 TABLET UNDER THE TONGUE ONCE  FOR EPIGASTRIC PAIN/CHEST PAIN NOT RELIEVED BY PEPPERMINT ALTOIDS. IF THIS DOES NOT RESOLVE PAIN, CALL DR! What changed:   how much to take  how to take this  when to take this  reasons to take this   ondansetron 4 MG disintegrating tablet Commonly known as: ZOFRAN-ODT TAKE 1 TABLET BY MOUTH EVERY 8 HOURS AS NEEDED FOR NAUSEA AND VOMITING What changed:   how much to take  how to take this  when to take this  reasons to take this   rosuvastatin 10 MG tablet Commonly known as: CRESTOR Take 2 tablets (20 mg total) by mouth daily. What changed: how much to take   ticagrelor 90 MG Tabs tablet Commonly known as: Brilinta Take 1 tablet (90 mg total) by mouth 2 (two) times daily.   vitamin B-12 100 MCG tablet Commonly known as: CYANOCOBALAMIN Take 100 mcg by mouth daily.   Vitamin D3 50 MCG (2000 UT) capsule Take 2,000 Units by mouth daily.          Allergies Allergies  Allergen Reactions  . Carvedilol Palpitations and Other (See Comments)    Abdominal bloating  . Claritin [Loratadine] Other (See Comments)    Blurred vision and dry eyes  . Lipitor [Atorvastatin]     fatigue  . Lisinopril Other (See Comments)    "drunk feeling"    Outstanding Labs/Studies   None  Duration of Discharge Encounter   Greater than 30 minutes including physician time.  Jarrett Soho, PA 04/09/2020, 9:18 AM

## 2020-04-08 NOTE — Discharge Summary (Incomplete)
Discharge Summary    Patient ID: Alison Ayala MRN: [id number]; DOB: 04-Dec-1945  Admit date: 04/08/2020 Discharge date: 04/08/2020  PCP:  Harlan Stains, MD   [place]  Cardiologist:  Mertie Moores, MD  Advanced Practice Provider:  No care team member to display Electrophysiologist:  None    Discharge Diagnoses    Principal Problem:   Unstable angina [facility]) Active Problems:   Essential hypertension   ILD (interstitial lung disease) ([place])   Hyperlipidemia LDL goal <70   CAD (coronary artery disease)    Diagnostic Studies/Procedures    Cath 04/08/2020  Ost LAD to Prox LAD lesion is 99% stenosed.  A drug-eluting stent was successfully placed using a SYNERGY XD 4.0X16.  Post intervention, there is a 0% residual stenosis.  The left ventricular systolic function is normal.  LV end diastolic pressure is normal.  The left ventricular ejection fraction is 55-65% by visual estimate.  There is no mitral valve regurgitation.   1. Severe ostial/proximal LAD stenosis 2. Successful PTCA/DES x 1 ostial/proximal LAD with optimization with OCT imaging 3. No obstructive disease in the large dominant Circumflex artery.  4. Small non-dominant RCA 5. Normal LV systolic function  Recommendations: Continue DAPT with ASA and Brilinta for at least six months but preferably one year if she tolerates it well. If necessary, the Brilinta could be changed to Plavix in 3 months. Same day post PCI discharge.   _____________   History of Present Illness     Alison Ayala is a 75 y.o. female with hx of  MVP and a family hx of CAD. 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  She is never had any serious cardiac issues.  She has had heart murmur and has been diagnosed with mitral valve prolapse.  Shes been exercising at the gym for the past several years.   No CP or dyspnea.  Gets tired more easily over the past  several weeks.   Needs to have her mediastinal mass removed and a lung biopsy.   Her father has a hx of CAD    October 20, 2019:  Alison Ayala is seen today for follow-up visit.  She has a history of mitral valve prolapse.  I saw her several years ago as part of a preoperative evaluation prior to removal of a mediastinal mass.  Now presents with some episodes of CP  First episode occurred after eating a BBQ sandwich. Saw GI ( [facility])  Was eventually found to have esophageal spasm Episodes seem to be related to her eating .  Has difficulty walking   Has severe episode of esophageal spasm after eating a Frosty from [facility]   Takes diltiazem QID, seems to help quite a bit  Jan. 6, 2022: Alison Ayala is seen today for follow up of her MVP and atypical cp She needs to have a GI procedure and his here partly for pre-op clearance She has mvp .  Has extensive exertional induced chest pressure.  She had a negative ETT previously  She is on cardizem 60 Q6.   She thinks the immediate release cardizem works than cardizem CD.  Her symptoms are c/w unstable angina -   Has chest pressure with any amount of of  May be esophageal spasm but her symptoms are exercise induced.   Feb. 28, 2022 Alison Ayala is seen today for work in visit to discuss her recent coronary CT angiogram. She was seen a month  ago for episodes of CP . Unstable angina . Coronary CT angio showed a significant prox LAD  Stenosis.   Coronary calcium score is  She is here to discuss cath   Hospital Course     Consultants: N/A  Patient [facility]2020 for the planned cardiac catheterization procedure by Dr. Angelena Form.  Cardiac catheterization revealed 99% ostial to proximal left anterior descending artery disease, this was treated with a single 4.0 x 16 mm Synergy XT drug-eluting stent.  She had no obstructive disease in the large dominant left circumflex artery was a small nondominant RCA.  Postprocedure,  she was placed on aspirin and Brilinta for at least 6 months however preferably 1 year if she is able to tolerate it.  She would qualify for same-day post PCI discharge.  Postprocedure, she was feeling well without any chest pain or shortness of breath.  Her radial cath site was examined and appeared ***.  She is deemed stable for discharge from cardiology perspective.   Did the patient have an acute coronary syndrome (MI, NSTEMI, STEMI, etc) this admission?:  No                               Did the patient have a percutaneous coronary intervention (stent / angioplasty)?:  Yes.     Cath/PCI Registry Performance & Quality Measures: 1. Aspirin prescribed? - Yes 2. ADP Receptor Inhibitor (Plavix/Clopidogrel, Brilinta/Ticagrelor or Effient/Prasugrel) prescribed (includes medically managed patients)? - Yes 3. High Intensity Statin (Lipitor 40-80mg  or Crestor 20-40mg ) prescribed? - Yes 4. For EF <40%, was ACEI/ARB prescribed? - Not Applicable (EF >/= 40%) 5. For EF <40%, Aldosterone Antagonist (Spironolactone or Eplerenone) prescribed? - Not Applicable (EF >/= 97%) 6. Cardiac Rehab Phase II ordered? - Yes       _____________  Discharge Vitals Blood pressure (!) 158/64, pulse 63, temperature 98.7 F (37.1 C), temperature source Oral, height 5\' 4"  (1.626 m), weight 99.8 kg, SpO2 96 %.  Filed Weights   04/08/20 1137  Weight: 99.8 kg    Labs & Radiologic Studies    CBC No results for input(s): WBC, NEUTROABS, HGB, HCT, MCV, PLT in the last 72 hours. Basic Metabolic Panel No results for input(s): NA, K, CL, CO2, GLUCOSE, BUN, CREATININE, CALCIUM, MG, PHOS in the last 72 hours. Liver Function Tests No results for input(s): AST, ALT, ALKPHOS, BILITOT, PROT, ALBUMIN in the last 72 hours. No results for input(s): LIPASE, AMYLASE in the last 72 hours. High Sensitivity Troponin:   No results for input(s): TROPONINIHS in the last 720 hours.  BNP Invalid input(s): POCBNP D-Dimer No results  for input(s): DDIMER in the last 72 hours. Hemoglobin A1C No results for input(s): HGBA1C in the last 72 hours. Fasting Lipid Panel No results for input(s): CHOL, HDL, LDLCALC, TRIG, CHOLHDL, LDLDIRECT in the last 72 hours. Thyroid Function Tests No results for input(s): TSH, T4TOTAL, T3FREE, THYROIDAB in the last 72 hours.  Invalid input(s): FREET3 _____________  CARDIAC CATHETERIZATION  Result Date: 04/08/2020  Ost LAD to Prox LAD lesion is 99% stenosed.  A drug-eluting stent was successfully placed using a SYNERGY XD 4.0X16.  Post intervention, there is a 0% residual stenosis.  The left ventricular systolic function is normal.  LV end diastolic pressure is normal.  The left ventricular ejection fraction is 55-65% by visual estimate.  There is no mitral valve regurgitation.  1. Severe ostial/proximal LAD stenosis 2. Successful PTCA/DES x  1 ostial/proximal LAD with optimization with OCT imaging 3. No obstructive disease in the large dominant Circumflex artery. 4. Small non-dominant RCA 5. Normal LV systolic function Recommendations: Continue DAPT with ASA and Brilinta for at least six months but preferably one year if she tolerates it well. If necessary, the Brilinta could be changed to Plavix in 3 months. Same day post PCI discharge.   CT CORONARY MORPH W/CTA COR W/SCORE W/CA W/CM &/OR WO/CM  Addendum Date: 03/31/2020   ADDENDUM REPORT: 03/31/2020 12:33 CLINICAL DATA:  27F with hypertension, hyperlipidemia, mitral valve prolapse and a mediastinal mass for preoperative risk assessment. EXAM: Cardiac/Coronary  CT TECHNIQUE: The patient was scanned on a [employer]. FINDINGS: A 120 kV prospective scan was triggered in the descending thoracic aorta at 111 HU's. Axial non-contrast 3 mm slices were carried out through the heart. The data set was analyzed on a dedicated work station and scored using the [place]. Gantry rotation speed was 250 msecs and collimation was .6 mm. No beta  blockade and 0.8 mg of sl NTG was given. The 3D data set was reconstructed in 5% intervals of the 67-82 % of the R-R cycle. Diastolic phases were analyzed on a dedicated work station using MPR, MIP and VRT modes. The patient received 80 cc of contrast. Aorta: Normal size. Calcification of the aortic root and descending aorta. No dissection. Aortic Valve:  Trileaflet.  No calcifications. Coronary Arteries:  Normal coronary origin.  Right dominance. RCA is a small, non-dominant artery.  There is no plaque. Left main is a large artery that gives rise to LAD, RI, and LCX arteries. Minimal (<25%) calcified plaque. LAD is a large vessel that has severe (>70%) soft plaque at the ostium. There is a tiny D1 that is diffusely diseased. D2 and D3 are small vessels without significant disease. LCX is a dominant artery that gives rise to one large OM1 branch and L-PDA. OM1 tapers abruptly and may be occluded but is difficult to evaluate. There is no significant disease in the LCX or L-PDA. Other findings: Normal pulmonary vein drainage into the left atrium. Normal let atrial appendage without a thrombus. Normal size of the pulmonary artery. Mild mitral annular calcification. IMPRESSION: 1. Coronary calcium score of 0.687. This was 26th percentile for age and sex matched control. 2. Normal coronary origin with left dominance. 3. There is severe (>70%) soft plaque in the ostial LAD. CAD-RADS 4. 4.  Will send for FFRct. Skeet Latch, MD Electronically Signed   By: Skeet Latch   On: 03/31/2020 12:33   Result Date: 03/31/2020 EXAM: OVER-READ INTERPRETATION  CT CHEST The following report is an over-read performed by radiologist Dr. Vinnie Langton of [facility], [place] on 03/30/2020. This over-read does not include interpretation of cardiac or coronary anatomy or pathology. The coronary calcium score/coronary CTA interpretation by the cardiologist is attached. COMPARISON:  None. FINDINGS: Aortic atherosclerosis.  Moderate hiatal hernia. Dependent areas of subsegmental atelectasis in the right lower lobe. Within the visualized portions of the thorax there are no suspicious appearing pulmonary nodules or masses, there is no acute consolidative airspace disease, no pleural effusions, no pneumothorax and no lymphadenopathy. Visualized portions of the upper abdomen are unremarkable. There are no aggressive appearing lytic or blastic lesions noted in the visualized portions of the skeleton. Median sternotomy wires. VP shunt catheter in the subcutaneous fat of the right anterior chest wall incidentally noted. IMPRESSION: 1.  Aortic Atherosclerosis (ICD10-I70.0). 2. Moderate-sized hiatal hernia. Electronically Signed: By: Quillian Quince  Entrikin M.D. On: 03/30/2020 13:25   CT CORONARY FRACTIONAL FLOW RESERVE DATA PREP  Result Date: 04/01/2020 EXAM: FFRCT ANALYSIS FINDINGS: FFRct analysis was performed on the original cardiac CT angiogram dataset. Diagrammatic representation of the FFRct analysis is provided in a separate PDF document in PACS. This dictation was created using the PDF document and an interactive 3D model of the results. 3D model is not available in the EMR/PACS. Normal FFR range is >0.80. 1. Left Main: FFRct 0.99. 2. LAD: Ostial LAD FFRct 0.96.  Proximal FFRct 0.73.  Mid FFRct 0.67 3. LCX: FFRct 0.98 proximal, 0.96 mid, 0.94 distal. OM1 FFRct 0.94. OM2 FFRct 0.88. L-PDA FFRct 0.57. 4. RCA: FFRct 0.93. IMPRESSION: 1. FFRct findings are consistent with severe stenosis in the ostial LAD. 2. FFRct findings are consistent with severe stenosis in the L-PDA. On comparison with the coronary CT-A, this region appears small and tortuous, but not severely stenotic. 3.  Recommend cardiac catheterization. Tiffany C. Oval Linsey, MD Electronically Signed   By: Skeet Latch   On: 04/01/2020 15:35   Disposition   Alison Ayala is being discharged home today in good condition.  Follow-up Plans & Appointments     Discharge Instructions     Diet - low sodium heart healthy   Complete by: As directed    Increase activity slowly   Complete by: As directed       Discharge Medications   Allergies as of 04/08/2020      Reactions   Carvedilol Palpitations, Other (See Comments)   Abdominal bloating   Claritin [loratadine] Other (See Comments)   Blurred vision and dry eyes   Lipitor [atorvastatin]    fatigue   Lisinopril Other (See Comments)   "drunk feeling"      Medication List    TAKE these medications   acetaminophen 500 MG tablet Commonly known as: TYLENOL Take 1-2 tablets (500-1,000 mg total) by mouth 2 (two) times daily as needed for moderate pain or headache.   aspirin EC 81 MG tablet Take 81 mg by mouth daily. Swallow whole.   carboxymethylcellulose 0.5 % Soln Commonly known as: REFRESH PLUS Place 1 drop into both eyes 2 (two) times daily as needed (dry eyes).   diltiazem 60 MG tablet Commonly known as: CARDIZEM TAKE 1 TABLET (60 MG TOTAL) BY MOUTH 4 (FOUR) TIMES DAILY.   esomeprazole 40 MG capsule Commonly known as: NexIUM Take 1 capsule (40 mg total) by mouth 2 (two) times daily before a meal. What changed:   when to take this  additional instructions   FLUoxetine 40 MG capsule Commonly known as: PROZAC Take 40 mg by mouth daily.   hydrochlorothiazide 50 MG tablet Commonly known as: HYDRODIURIL Take 50 mg by mouth daily.   ibuprofen 200 MG tablet Commonly known as: ADVIL Take 400-600 mg by mouth 2 (two) times daily as needed for headache or moderate pain.   losartan 100 MG tablet Commonly known as: COZAAR Take 100 mg by mouth daily.   metoprolol succinate 50 MG 24 hr tablet Commonly known as: TOPROL-XL Take 50 mg by mouth daily. Take with or immediately following a meal.   nitroGLYCERIN 0.4 MG SL tablet Commonly known as: NITROSTAT DISSOLVE 1 TABLET UNDER THE TONGUE ONCE FOR EPIGASTRIC PAIN/CHEST PAIN NOT RELIEVED BY PEPPERMINT ALTOIDS. IF THIS DOES NOT RESOLVE PAIN, CALL  DR! What changed:   how much to take  how to take this  when to take this  reasons to take this   ondansetron 4 MG  disintegrating tablet Commonly known as: ZOFRAN-ODT TAKE 1 TABLET BY MOUTH EVERY 8 HOURS AS NEEDED FOR NAUSEA AND VOMITING What changed:   how much to take  how to take this  when to take this  reasons to take this   rosuvastatin 10 MG tablet Commonly known as: CRESTOR Take 2 tablets (20 mg total) by mouth daily. What changed: how much to take   ticagrelor 90 MG Tabs tablet Commonly known as: Brilinta Take 1 tablet (90 mg total) by mouth 2 (two) times daily.   vitamin B-12 100 MCG tablet Commonly known as: CYANOCOBALAMIN Take 100 mcg by mouth daily.   Vitamin D3 50 MCG (2000 UT) capsule Take 2,000 Units by mouth daily.          Outstanding Labs/Studies   N/A  Duration of Discharge Encounter   Greater than 30 minutes including physician time.  Hilbert Corrigan, [place] 04/08/2020, 4:50 PM

## 2020-04-08 NOTE — Progress Notes (Addendum)
Alison Ayala in to see client and per Mickel Baas Dr Julianne Handler is going to admit client and remove air in 1hour

## 2020-04-08 NOTE — Progress Notes (Signed)
Area of firmness noted right radial and pressure held x 38min and area soft and bruised

## 2020-04-09 ENCOUNTER — Encounter (HOSPITAL_COMMUNITY): Payer: Self-pay | Admitting: Cardiovascular Disease

## 2020-04-09 DIAGNOSIS — Z79899 Other long term (current) drug therapy: Secondary | ICD-10-CM | POA: Diagnosis not present

## 2020-04-09 DIAGNOSIS — Z955 Presence of coronary angioplasty implant and graft: Secondary | ICD-10-CM

## 2020-04-09 DIAGNOSIS — Z7982 Long term (current) use of aspirin: Secondary | ICD-10-CM | POA: Diagnosis not present

## 2020-04-09 DIAGNOSIS — Z888 Allergy status to other drugs, medicaments and biological substances status: Secondary | ICD-10-CM | POA: Diagnosis not present

## 2020-04-09 DIAGNOSIS — E785 Hyperlipidemia, unspecified: Secondary | ICD-10-CM

## 2020-04-09 DIAGNOSIS — I10 Essential (primary) hypertension: Secondary | ICD-10-CM | POA: Diagnosis not present

## 2020-04-09 DIAGNOSIS — J849 Interstitial pulmonary disease, unspecified: Secondary | ICD-10-CM | POA: Diagnosis not present

## 2020-04-09 DIAGNOSIS — I341 Nonrheumatic mitral (valve) prolapse: Secondary | ICD-10-CM | POA: Diagnosis not present

## 2020-04-09 DIAGNOSIS — I200 Unstable angina: Secondary | ICD-10-CM | POA: Diagnosis not present

## 2020-04-09 DIAGNOSIS — Z8711 Personal history of peptic ulcer disease: Secondary | ICD-10-CM | POA: Diagnosis not present

## 2020-04-09 DIAGNOSIS — I25110 Atherosclerotic heart disease of native coronary artery with unstable angina pectoris: Secondary | ICD-10-CM | POA: Diagnosis not present

## 2020-04-09 LAB — CBC
HCT: 38.6 % (ref 36.0–46.0)
Hemoglobin: 12.6 g/dL (ref 12.0–15.0)
MCH: 31.2 pg (ref 26.0–34.0)
MCHC: 32.6 g/dL (ref 30.0–36.0)
MCV: 95.5 fL (ref 80.0–100.0)
Platelets: 235 10*3/uL (ref 150–400)
RBC: 4.04 MIL/uL (ref 3.87–5.11)
RDW: 13.6 % (ref 11.5–15.5)
WBC: 7.2 10*3/uL (ref 4.0–10.5)
nRBC: 0 % (ref 0.0–0.2)

## 2020-04-09 LAB — BASIC METABOLIC PANEL
Anion gap: 9 (ref 5–15)
BUN: 15 mg/dL (ref 8–23)
CO2: 25 mmol/L (ref 22–32)
Calcium: 9.8 mg/dL (ref 8.9–10.3)
Chloride: 103 mmol/L (ref 98–111)
Creatinine, Ser: 0.8 mg/dL (ref 0.44–1.00)
GFR, Estimated: >60 mL/min (ref >60)
Glucose, Bld: 112 mg/dL — ABNORMAL HIGH (ref 70–99)
Potassium: 3.9 mmol/L (ref 3.5–5.1)
Sodium: 137 mmol/L (ref 135–145)

## 2020-04-09 NOTE — Plan of Care (Signed)
  Problem: Education: Goal: Knowledge of General Education information will improve Description: Including pain rating scale, medication(s)/side effects and non-pharmacologic comfort measures Outcome: Adequate for Discharge   

## 2020-04-09 NOTE — TOC Benefit Eligibility Note (Signed)
Patient Teacher, English as a foreign language completed.    The patient is insured through [employer] and for Brilinta 90 mg has a copay of $45.00.  Lyndel Safe, [place] Patient Advocate Specialist [place] [facility] Stewardship Team Direct Number: [phone]  Fax: [phone]

## 2020-04-09 NOTE — Progress Notes (Signed)
CARDIAC REHAB PHASE I   Pt ambulating independently without difficulty. Pt denies CP, SOB, or dizziness. Pt educated on importance of ASA, Brilinta, and NTG. Pt given stent card and heart healthy diet. Reviewed restrictions, site care, and exercise guidelines. Will refer to CRP II [place].   [id number] Rufina Falco, RN BSN 04/09/2020 9:42 AM

## 2020-04-09 NOTE — Progress Notes (Signed)
CARDIAC REHAB PHASE I   Went to offer to walk with pt. Pt agreeable, states some SOB with Brilinta. Went to get pt coffee and breakfast delivered. Will return after pt eats. Materials left at bedside.  Rufina Falco, RN BSN 04/09/2020 8:34 AM

## 2020-04-09 NOTE — Progress Notes (Signed)
Discharge instructions (including medications) discussed with and copy provided to patient/caregiver 

## 2020-04-15 ENCOUNTER — Telehealth: Payer: Self-pay | Admitting: *Deleted

## 2020-04-15 NOTE — Telephone Encounter (Signed)
   Primary Cardiologist: Mertie Moores, MD  Chart reviewed as part of pre-operative protocol coverage. Simple dental extractions are considered low risk procedures per guidelines and generally do not require any specific cardiac clearance. It is also generally accepted that for simple extractions and dental cleanings, there is no need to interrupt blood thinner therapy.   SBE prophylaxis is not required for the patient.  I will route this recommendation to the requesting party via Epic fax function and remove from pre-op pool.  Please call with questions.  Deberah Pelton, NP 04/15/2020, 12:31 PM

## 2020-04-15 NOTE — Telephone Encounter (Signed)
   [facility] Pre-operative Risk Assessment    HEARTCARE STAFF: - Please ensure there is not already an duplicate clearance open for this procedure. - Under Visit Info/Reason for Call, type in Other and utilize the format Clearance MM/DD/YY or Clearance TBD. Do not use dashes or single digits. - If request is for dental extraction, please clarify the # of teeth to be extracted.  Request for surgical clearance:  1. What type of surgery is being performed? ROUTINE DENTAL CLEANING   2. When is this surgery scheduled? TBD   3. What type of clearance is required (medical clearance vs. Pharmacy clearance to hold med vs. Both)? MEDICAL  4. Are there any medications that need to be held prior to surgery and how long? ASA AND BRILINTA    5. Practice name and name of physician performing surgery? [facility]; DR. Jasmine December   6. What is the office phone number? [phone]   7.   What is the office fax number? [phone]  8.   Anesthesia type (None, local, MAC, general) ? NONE LISTED TO BE USED   Julaine Hua 04/15/2020, 11:45 AM  _________________________________________________________________   (provider comments below)

## 2020-04-16 ENCOUNTER — Telehealth (HOSPITAL_COMMUNITY): Payer: Self-pay

## 2020-04-16 NOTE — Telephone Encounter (Signed)
Pt insurance is active and benefits verified through [facility] Co-pay $10.00, DED $0.00/$0.00 met, out of pocket $3,900.00/$290.09 met, co-insurance 0%. No pre-authorization required. Mike/[employer], 04/16/20 @ 325PM, REF# [id number]  Will contact patient to see if she is interested in the Cardiac Rehab Program. If interested, patient will need to complete follow up appt. Once completed, patient will be contacted for scheduling upon review by the RN Navigator.

## 2020-04-16 NOTE — Telephone Encounter (Signed)
Called and spoke with pt in regards to CR, pt stated she is not interested at this time.   Closed referral 

## 2020-04-19 DIAGNOSIS — E785 Hyperlipidemia, unspecified: Secondary | ICD-10-CM | POA: Diagnosis not present

## 2020-04-19 DIAGNOSIS — F3341 Major depressive disorder, recurrent, in partial remission: Secondary | ICD-10-CM | POA: Diagnosis not present

## 2020-04-19 DIAGNOSIS — M810 Age-related osteoporosis without current pathological fracture: Secondary | ICD-10-CM | POA: Diagnosis not present

## 2020-04-19 DIAGNOSIS — I10 Essential (primary) hypertension: Secondary | ICD-10-CM | POA: Diagnosis not present

## 2020-04-19 DIAGNOSIS — K219 Gastro-esophageal reflux disease without esophagitis: Secondary | ICD-10-CM | POA: Diagnosis not present

## 2020-04-21 DIAGNOSIS — D2261 Melanocytic nevi of right upper limb, including shoulder: Secondary | ICD-10-CM | POA: Diagnosis not present

## 2020-04-21 DIAGNOSIS — Z85828 Personal history of other malignant neoplasm of skin: Secondary | ICD-10-CM | POA: Diagnosis not present

## 2020-04-21 DIAGNOSIS — L821 Other seborrheic keratosis: Secondary | ICD-10-CM | POA: Diagnosis not present

## 2020-04-21 DIAGNOSIS — D224 Melanocytic nevi of scalp and neck: Secondary | ICD-10-CM | POA: Diagnosis not present

## 2020-04-21 DIAGNOSIS — D225 Melanocytic nevi of trunk: Secondary | ICD-10-CM | POA: Diagnosis not present

## 2020-04-21 DIAGNOSIS — D1801 Hemangioma of skin and subcutaneous tissue: Secondary | ICD-10-CM | POA: Diagnosis not present

## 2020-04-21 DIAGNOSIS — Q825 Congenital non-neoplastic nevus: Secondary | ICD-10-CM | POA: Diagnosis not present

## 2020-04-21 DIAGNOSIS — L814 Other melanin hyperpigmentation: Secondary | ICD-10-CM | POA: Diagnosis not present

## 2020-04-21 DIAGNOSIS — D2239 Melanocytic nevi of other parts of face: Secondary | ICD-10-CM | POA: Diagnosis not present

## 2020-05-03 ENCOUNTER — Other Ambulatory Visit: Payer: Self-pay

## 2020-05-03 ENCOUNTER — Encounter: Payer: Self-pay | Admitting: Physician Assistant

## 2020-05-03 ENCOUNTER — Ambulatory Visit: Payer: Medicare HMO | Admitting: Physician Assistant

## 2020-05-03 VITALS — BP 144/62 | HR 59 | Ht 64.0 in | Wt 220.4 lb

## 2020-05-03 DIAGNOSIS — I2510 Atherosclerotic heart disease of native coronary artery without angina pectoris: Secondary | ICD-10-CM

## 2020-05-03 DIAGNOSIS — I10 Essential (primary) hypertension: Secondary | ICD-10-CM | POA: Diagnosis not present

## 2020-05-03 DIAGNOSIS — E785 Hyperlipidemia, unspecified: Secondary | ICD-10-CM | POA: Diagnosis not present

## 2020-05-03 MED ORDER — TICAGRELOR 90 MG PO TABS: 90.0000 mg | ORAL_TABLET | Freq: Two times a day (BID) | ORAL | 3 refills | Status: DC

## 2020-05-03 MED ORDER — ROSUVASTATIN CALCIUM 20 MG PO TABS: 20.0000 mg | ORAL_TABLET | Freq: Every day | ORAL | 3 refills | Status: DC

## 2020-05-03 MED ORDER — VALSARTAN 320 MG PO TABS: 320.0000 mg | ORAL_TABLET | Freq: Every day | ORAL | 3 refills | Status: AC

## 2020-05-03 NOTE — Progress Notes (Signed)
Cardiology Office Note:    Date:  05/03/2020   ID:  Alison Ayala, DOB [birth year], MRN [id number]  PCP:  Harlan Stains, MD   [place]  Cardiologist:  Mertie Moores, MD    Electrophysiologist:  None       Referring MD: Harlan Stains, MD   Chief Complaint:  Hospitalization Follow-up (S/p PCI)    Patient Profile:    Alison Ayala is a 75 y.o. female with:   Coronary artery disease   S/p DES to oLAD in 04/2020  Hypertension   Hyperlipidemia   MV prolapse  Mediastinal mass s/p resection in 2019  Path c/w Castleman's disease   Hx of VP shunt  Hx of subarachnoid hemorrhage  Interstitial Lung Dz (Dr. Chase Caller)  GERD  Jackhammer esophagus   Prior CV studies: Cardiac catheterization Apr 19, 2020  Ost LAD to Prox LAD lesion is 99% stenosed.  A drug-eluting stent was successfully placed using a SYNERGY XD 4.0X16.  Post intervention, there is a 0% residual stenosis.  The left ventricular systolic function is normal.  LV end diastolic pressure is normal.  The left ventricular ejection fraction is 55-65% by visual estimate.  There is no mitral valve regurgitation.   1. Severe ostial/proximal LAD stenosis 2. Successful PTCA/DES x 1 ostial/proximal LAD with optimization with OCT imaging 3. No obstructive disease in the large dominant Circumflex artery.  4. Small non-dominant RCA 5. Normal LV systolic function  Recommendations: Continue DAPT with ASA and Brilinta for at least six months but preferably one year if she tolerates it well. If necessary, the Brilinta could be changed to Plavix in 3 months. Same day post PCI discharge.    Echocardiogram 07/25/2017 Mild focal basal septal hypertrophy, EF 55-60, no RWMA, mild AI, mild MVP, trivial MR, mild LAE, trivial TR     History of Present Illness:    Ms. Alison Ayala evaluated for chest discomfort.  Coronary CTA demonstrated significant proximal LAD stenosis.  She was set up for  cardiac catheterization which was performed 2020-04-19 by Dr. Angelena Form.  She had severe ostial LAD stenosis which was treated with a DES.  Post-cath she had a right radial hematoma and was observed overnight.  She was discharged home on aspirin and ticagrelor.  Her rosuvastatin was increased to 20 mg daily.  She returns for follow-up.  She is here alone.  She was not aware that she was to increase her Rosuvastatin dose.  She has a lot of epigastric pain related to GERD.  She has not had any further chest pain. She has noted improved dyspnea on exertion.  She feels that it keeps getting better.  she has not had syncope, leg edema.  She has slept in a recliner for years.          Past Medical History:  Diagnosis Date  . Carpal tunnel syndrome of right wrist   . Diverticulosis   . GERD (gastroesophageal reflux disease)   . H/O hiatal hernia   . H/O intraventricular hemorrhage 2004--  SPONTANEOUS   S/P SHUNT  . Heart murmur   . Hiatal hernia   . Hip pain, right OCCASIONAL -- PINCHED NERVE  . History of gastric ulcer 2001  . Hyperlipidemia   . Hypertension   . Hypoglycemia   . Internal hemorrhoids   . Interstitial lung disease ([place]) 2019   did lung test but has since improved per patient  . Jackhammer esophagus   . MVP (mitral valve prolapse)   . Normal  echocardiogram 30 YRS AGO  . S/P ventriculoperitoneal shunt 2004--- PER PT SHUNT IS WORKING WELL --  NO ISSUES  . Squamous cell cancer of scalp and skin of neck    mole back of head  . Tubular adenoma of colon     Current Medications: Current Meds  Medication Sig  . acetaminophen (TYLENOL) 500 MG tablet Take 1-2 tablets (500-1,000 mg total) by mouth 2 (two) times daily as needed for moderate pain or headache.  Marland Kitchen aspirin EC 81 MG tablet Take 81 mg by mouth daily. Swallow whole.  . carboxymethylcellulose (REFRESH PLUS) 0.5 % SOLN Place 1 drop into both eyes 2 (two) times daily as needed (dry eyes).  . Cholecalciferol (VITAMIN D3) 50 MCG  (2000 UT) capsule Take 2,000 Units by mouth daily.  Marland Kitchen diltiazem (CARDIZEM) 60 MG tablet TAKE 1 TABLET (60 MG TOTAL) BY MOUTH 4 (FOUR) TIMES DAILY.  Marland Kitchen esomeprazole (NEXIUM) 40 MG capsule Take 1 capsule (40 mg total) by mouth 2 (two) times daily before a meal.  . FLUoxetine (PROZAC) 40 MG capsule Take 40 mg by mouth daily.  . hydrochlorothiazide (HYDRODIURIL) 50 MG tablet Take 50 mg by mouth daily.  Marland Kitchen ibuprofen (ADVIL,MOTRIN) 200 MG tablet Take 400-600 mg by mouth 2 (two) times daily as needed for headache or moderate pain.  . metoprolol succinate (TOPROL-XL) 50 MG 24 hr tablet Take 50 mg by mouth daily. Take with or immediately following a meal.  . nitroGLYCERIN (NITROSTAT) 0.4 MG SL tablet DISSOLVE 1 TABLET UNDER THE TONGUE ONCE FOR EPIGASTRIC PAIN/CHEST PAIN NOT RELIEVED BY PEPPERMINT ALTOIDS. IF THIS DOES NOT RESOLVE PAIN, CALL DR!  Marland Kitchen ondansetron (ZOFRAN-ODT) 4 MG disintegrating tablet TAKE 1 TABLET BY MOUTH EVERY 8 HOURS AS NEEDED FOR NAUSEA AND VOMITING  . rosuvastatin (CRESTOR) 20 MG tablet Take 1 tablet (20 mg total) by mouth daily.  . valsartan (DIOVAN) 320 MG tablet Take 1 tablet (320 mg total) by mouth daily.  . vitamin B-12 (CYANOCOBALAMIN) 100 MCG tablet Take 100 mcg by mouth daily.  . [DISCONTINUED] losartan (COZAAR) 100 MG tablet Take 100 mg by mouth daily.  . [DISCONTINUED] rosuvastatin (CRESTOR) 10 MG tablet Take 2 tablets (20 mg total) by mouth daily.  . [DISCONTINUED] ticagrelor (BRILINTA) 90 MG TABS tablet Take 1 tablet (90 mg total) by mouth 2 (two) times daily.     Allergies:   Carvedilol, Claritin [loratadine], Lipitor [atorvastatin], and Lisinopril   Social History   Tobacco Use  . Smoking status: Never Smoker  . Smokeless tobacco: Never Used  Vaping Use  . Vaping Use: Never used  Substance Use Topics  . Alcohol use: Yes    Alcohol/week: 2.0 standard drinks    Types: 2 Glasses of wine per week    Comment: OCCASIONAL  . Drug use: No     Family Hx: The patient's  family history includes Breast cancer in her maternal aunt; Colon cancer (age of onset: 17) in her mother; Colon polyps in her mother; Stomach cancer in her maternal aunt; Uterine cancer in her maternal aunt. There is no history of Rectal cancer or Esophageal cancer.  ROS   EKGs/Labs/Other Test Reviewed:    EKG:  EKG is   ordered today.  The ekg ordered today demonstrates sinus brady, HR 54, normal axis, first degree AV block, PR 232, QTc 417 ms, no ST-TW changes.   Recent Labs: 04/09/2020: BUN 15; Creatinine, Ser 0.80; Hemoglobin 12.6; Platelets 235; Potassium 3.9; Sodium 137   Recent Lipid Panel No results  found for: CHOL, TRIG, HDL, CHOLHDL, LDLCALC, LDLDIRECT  Labs obtained through [facility] - personally reviewed and interpreted: 01/05/20: TC 195, HDL 42, LDL 124, Trig 161, A1c 5.7, ALT 8   Risk Assessment/Calculations:      Physical Exam:    VS:  BP (!) 144/62   Pulse (!) 59   Ht 5\' 4"  (1.626 m)   Wt 220 lb 6.4 oz (100 kg)   SpO2 96%   BMI 37.83 kg/m     Wt Readings from Last 3 Encounters:  05/03/20 220 lb 6.4 oz (100 kg)  04/09/20 222 lb 3.6 oz (100.8 kg)  04/05/20 224 lb 6.6 oz (101.8 kg)     Constitutional:      Appearance: Healthy appearance. Not in distress.  Neck:     Vascular: No JVR. JVD normal.  Pulmonary:     Effort: Pulmonary effort is normal.     Breath sounds: No wheezing. No rales.  Cardiovascular:     Normal rate. Regular rhythm. Normal S1. Normal S2.     Murmurs: There is a grade 1/6 systolic murmur at the URSB.     Comments: R wrist w/o hematoma  Edema:    Peripheral edema absent.  Abdominal:     Palpations: Abdomen is soft.  Skin:    General: Skin is warm and dry.  Neurological:     Mental Status: Alert and oriented to person, place and time.     Cranial Nerves: Cranial nerves are intact.          ASSESSMENT & PLAN:    1. Coronary artery disease involving native coronary artery of native heart without angina pectoris S/p recent DES  to the LAD.  We discussed the importance of dual antiplatelet Rx and she understands that she needs to remain on ASA and Ticagrelor for 1 year.  Continue ASA, Ticagrelor, rosuvastatin.  She is not interested in cardiac rehabilitation.  I have encouraged her to gradually increase her aerobic activity.  She knows to contact us if she changes her mind regarding cardiac rehabilitation.    2. Essential hypertension BP is above target and she notes it tends to run high.  Continue current dose of HCTZ, diltiazem, metoprolol succinate.  DC Losartan. Start Valsartan 320 mg once daily.  BMET 2 weeks.   3. Hyperlipidemia LDL goal <70 Increase Rosuvastatin to 20 mg once daily.  Lipids and LFTs in 3 mos.        Dispo:  Return in about 3 months (around 07/28/2020) for Scheduled Follow Up, w/ Dr. Acie Fredrickson.   Medication Adjustments/Labs and Tests Ordered: Current medicines are reviewed at length with the patient today.  Concerns regarding medicines are outlined above.  Tests Ordered: Orders Placed This Encounter  Procedures  . EKG 12-Lead   Medication Changes: Meds ordered this encounter  Medications  . ticagrelor (BRILINTA) 90 MG TABS tablet    Sig: Take 1 tablet (90 mg total) by mouth 2 (two) times daily.    Dispense:  180 tablet    Refill:  3  . rosuvastatin (CRESTOR) 20 MG tablet    Sig: Take 1 tablet (20 mg total) by mouth daily.    Dispense:  90 tablet    Refill:  3  . valsartan (DIOVAN) 320 MG tablet    Sig: Take 1 tablet (320 mg total) by mouth daily.    Dispense:  90 tablet    Refill:  3    Signed, Richardson Dopp, PA-C  05/03/2020 4:48 PM  [facility] [address], [place], [place]  [address] Phone: [phone]; Fax: [phone]

## 2020-05-03 NOTE — Patient Instructions (Addendum)
Medication Instructions:  Your physician has recommended you make the following change in your medication:  1.  STOP Losartan 2.  START Valsartan 320 mg taking 1 daily 3.  INCREASE the Rosuvastatin to 20 mg taking 1 daily   *If you need a refill on your cardiac medications before your next appointment, please call your pharmacy*   Lab Work: 2 WEEKS:  BMET  [place] DR. Acie Fredrickson, WILL GET FASTING LABS  If you have labs (blood work) drawn today and your tests are completely normal, you will receive your results only by: Marland Kitchen MyChart Message (if you have MyChart) OR . A paper copy in the mail If you have any lab test that is abnormal or we need to change your treatment, we will call you to review the results.   Testing/Procedures: None ordered   Follow-Up: At [facility], you and your health needs are our priority.  As part of our continuing mission to provide you with exceptional heart care, we have created designated Provider Care Teams.  These Care Teams include your primary Cardiologist (physician) and Advanced Practice Providers (APPs -  Physician Assistants and Nurse Practitioners) who all work together to provide you with the care you need, when you need it.  We recommend signing up for the patient portal called "MyChart".  Sign up information is provided on this After Visit Summary.  MyChart is used to connect with patients for Virtual Visits (Telemedicine).  Patients are able to view lab/test results, encounter notes, upcoming appointments, etc.  Non-urgent messages can be sent to your provider as well.   To learn more about what you can do with MyChart, go to [email or web address].    Your next appointment:   As scheduled  The format for your next appointment:   In Person  Provider:   Liam Rogers, MD  Other Instructions

## 2020-05-12 ENCOUNTER — Other Ambulatory Visit: Payer: Medicare HMO

## 2020-05-17 ENCOUNTER — Other Ambulatory Visit: Payer: Self-pay

## 2020-05-17 ENCOUNTER — Other Ambulatory Visit: Payer: Medicare HMO | Admitting: *Deleted

## 2020-05-17 DIAGNOSIS — I200 Unstable angina: Secondary | ICD-10-CM | POA: Diagnosis not present

## 2020-05-17 LAB — BASIC METABOLIC PANEL
BUN/Creatinine Ratio: 25 (ref 12–28)
BUN: 22 mg/dL (ref 8–27)
CO2: 22 mmol/L (ref 20–29)
Calcium: 10 mg/dL (ref 8.7–10.3)
Chloride: 101 mmol/L (ref 96–106)
Creatinine, Ser: 0.88 mg/dL (ref 0.57–1.00)
Glucose: 90 mg/dL (ref 65–99)
Potassium: 4.1 mmol/L (ref 3.5–5.2)
Sodium: 140 mmol/L (ref 134–144)
eGFR: 68 mL/min/{1.73_m2} (ref >59)

## 2020-05-19 DIAGNOSIS — M7071 Other bursitis of hip, right hip: Secondary | ICD-10-CM | POA: Diagnosis not present

## 2020-05-19 DIAGNOSIS — M7072 Other bursitis of hip, left hip: Secondary | ICD-10-CM | POA: Diagnosis not present

## 2020-05-26 ENCOUNTER — Other Ambulatory Visit: Payer: Self-pay | Admitting: Internal Medicine

## 2020-06-09 DIAGNOSIS — M25551 Pain in right hip: Secondary | ICD-10-CM | POA: Diagnosis not present

## 2020-06-09 DIAGNOSIS — M25552 Pain in left hip: Secondary | ICD-10-CM | POA: Diagnosis not present

## 2020-06-21 ENCOUNTER — Other Ambulatory Visit: Payer: Self-pay | Admitting: Internal Medicine

## 2020-06-23 DIAGNOSIS — H17823 Peripheral opacity of cornea, bilateral: Secondary | ICD-10-CM | POA: Diagnosis not present

## 2020-06-23 DIAGNOSIS — H18593 Other hereditary corneal dystrophies, bilateral: Secondary | ICD-10-CM | POA: Diagnosis not present

## 2020-06-23 DIAGNOSIS — H16223 Keratoconjunctivitis sicca, not specified as Sjogren's, bilateral: Secondary | ICD-10-CM | POA: Diagnosis not present

## 2020-06-23 DIAGNOSIS — K5090 Crohn's disease, unspecified, without complications: Secondary | ICD-10-CM | POA: Diagnosis not present

## 2020-06-23 DIAGNOSIS — H35372 Puckering of macula, left eye: Secondary | ICD-10-CM | POA: Diagnosis not present

## 2020-06-23 DIAGNOSIS — H31092 Other chorioretinal scars, left eye: Secondary | ICD-10-CM | POA: Diagnosis not present

## 2020-06-29 ENCOUNTER — Encounter: Payer: Self-pay | Admitting: Gastroenterology

## 2020-06-29 ENCOUNTER — Ambulatory Visit: Payer: Medicare HMO | Admitting: Gastroenterology

## 2020-06-29 VITALS — BP 120/62 | HR 74 | Ht 64.0 in | Wt 213.0 lb

## 2020-06-29 DIAGNOSIS — R143 Flatulence: Secondary | ICD-10-CM | POA: Diagnosis not present

## 2020-06-29 DIAGNOSIS — K224 Dyskinesia of esophagus: Secondary | ICD-10-CM | POA: Diagnosis not present

## 2020-06-29 DIAGNOSIS — K219 Gastro-esophageal reflux disease without esophagitis: Secondary | ICD-10-CM

## 2020-06-29 DIAGNOSIS — R1084 Generalized abdominal pain: Secondary | ICD-10-CM

## 2020-06-29 DIAGNOSIS — R140 Abdominal distension (gaseous): Secondary | ICD-10-CM

## 2020-06-29 NOTE — Progress Notes (Signed)
06/29/2020 Alison Ayala [id number] 1945/04/30   HISTORY OF PRESENT ILLNESS: This is a 75 year old female who is a patient of Dr. Vena Rua.  She has past medical history of GERD, hiatal hernia, jackhammer esophagus, colon polyps, diverticulosis, constipation, history of Castleman's disease, hypertension, hyperlipidemia, history of VP shunt.  She actually just underwent cardiac catheterization in early March and had a drug-eluting stent placed so is on Brilinta.  She is here today with complaints of generalized abdominal discomfort that she describes as a bloating or tight feeling.  She is having a lot of gas.  Takes Gas-X, which does help some.  This has been an issue for the past several weeks, but notes that she has has had similar symptoms in the past.  Says that she is having 2-3 bowel movements a day, which is somewhat increased in frequency from her usual, but is not diarrhea.  Past Medical History:  Diagnosis Date  . Carpal tunnel syndrome of right wrist   . Diverticulosis   . GERD (gastroesophageal reflux disease)   . H/O hiatal hernia   . H/O intraventricular hemorrhage 2004--  SPONTANEOUS   S/P SHUNT  . Heart murmur   . Hiatal hernia   . Hip pain, right OCCASIONAL -- PINCHED NERVE  . History of gastric ulcer 2001  . Hyperlipidemia   . Hypertension   . Hypoglycemia   . Internal hemorrhoids   . Interstitial lung disease ([place]) 2019   did lung test but has since improved per patient  . Jackhammer esophagus   . MVP (mitral valve prolapse)   . Normal echocardiogram 30 YRS AGO  . S/P ventriculoperitoneal shunt 2004--- PER PT SHUNT IS WORKING WELL --  NO ISSUES  . Squamous cell cancer of scalp and skin of neck    mole back of head  . Tubular adenoma of colon    Past Surgical History:  Procedure Laterality Date  . BILATERAL FRONTAL INTERVENTRICULAR CATHETER VIA TWIST DRILL HOLE  AUG 2004   SPONTANEOUS SUBARACHNOID  HEMORRHAGE AND  INTRAVENTRICULAR HEMORRHAGE  . CARPAL  TUNNEL RELEASE  07/26/2011   Procedure: CARPAL TUNNEL RELEASE;  Surgeon: Magnus Sinning, MD;  Location: [place];  Service: Orthopedics;  Laterality: Right;  . CATARACT EXTRACTION W/ INTRAOCULAR LENS  IMPLANT, BILATERAL  2012   BILATERAL  . CORONARY STENT INTERVENTION N/A 04/08/2020   Procedure: CORONARY STENT INTERVENTION;  Surgeon: Burnell Blanks, MD;  Location: [place] [facility];  Service: Cardiovascular;  Laterality: N/A;  . DUPUYTREN CONTRACTURE RELEASE  07/26/2011   Procedure: DUPUYTREN CONTRACTURE RELEASE;  Surgeon: Magnus Sinning, MD;  Location: [facility];  Service: Orthopedics;  Laterality: Right;  EXCISION OF DUPYTRENS BAND THUMB- INDEX WEB SPACE  . ESOPHAGEAL MANOMETRY N/A 01/27/2019   Procedure: ESOPHAGEAL MANOMETRY (EM);  Surgeon: Jerene Bears, MD;  Location: [facility];  Service: Gastroenterology;  Laterality: N/A;  . EYE SURGERY Bilateral    scrapped corneas  . INTRAVASCULAR IMAGING/OCT N/A 04/08/2020   Procedure: INTRAVASCULAR IMAGING/OCT;  Surgeon: Burnell Blanks, MD;  Location: [facility];  Service: Cardiovascular;  Laterality: N/A;  . LEFT HEART CATH AND CORONARY ANGIOGRAPHY N/A 04/08/2020   Procedure: LEFT HEART CATH AND CORONARY ANGIOGRAPHY;  Surgeon: Burnell Blanks, MD;  Location: [facility] [place] [facility];  Service: Cardiovascular;  Laterality: N/A;  . LUNG BIOPSY Right 08/07/2017   Procedure: Open LUNG BIOPSY;  Surgeon: Ivin Poot, MD;  Location: [facility];  Service: Thoracic;  Laterality: Right;  . RESECTION OF MEDIASTINAL MASS N/A 08/07/2017   Procedure: RESECTION OF MEDIASTINAL MASS;  Surgeon: Ivin Poot, MD;  Location: [facility];  Service: Thoracic;  Laterality: N/A;  . RETINAL DETACHMENT REPAIR W/ SCLERAL BUCKLE LE  03-10-2011   LEFT EYE  . STERNOTOMY N/A 08/07/2017   Procedure: STERNOTOMY;  Surgeon: Prescott Gum, Collier Salina, MD;  Location: [place];  Service: Thoracic;  Laterality: N/A;  . VENTRICULOPERITONEAL  SHUNT  10-08-2002   RIGHT FRONTAL FOR COMMUNICATING HYDROCEPHALUS    reports that she has never smoked. She has never used smokeless tobacco. She reports current alcohol use of about 2.0 standard drinks of alcohol per week. She reports that she does not use drugs. family history includes Breast cancer in her maternal aunt; Colon cancer (age of onset: 69) in her mother; Colon polyps in her mother; Stomach cancer in her maternal aunt; Uterine cancer in her maternal aunt. Allergies  Allergen Reactions  . Carvedilol Palpitations and Other (See Comments)    Abdominal bloating  . Claritin [Loratadine] Other (See Comments)    Blurred vision and dry eyes  . Lipitor [Atorvastatin]     fatigue  . Lisinopril Other (See Comments)    "drunk feeling"      Outpatient Encounter Medications as of 06/29/2020  Medication Sig  . acetaminophen (TYLENOL) 500 MG tablet Take 1-2 tablets (500-1,000 mg total) by mouth 2 (two) times daily as needed for moderate pain or headache.  Marland Kitchen aspirin EC 81 MG tablet Take 81 mg by mouth daily. Swallow whole.  . carboxymethylcellulose (REFRESH PLUS) 0.5 % SOLN Place 1 drop into both eyes 2 (two) times daily as needed (dry eyes).  . Cholecalciferol (VITAMIN D3) 50 MCG (2000 UT) capsule Take 2,000 Units by mouth daily.  Marland Kitchen diltiazem (CARDIZEM) 60 MG tablet TAKE 1 TABLET (60 MG TOTAL) BY MOUTH 4 (FOUR) TIMES DAILY.  Marland Kitchen esomeprazole (NEXIUM) 40 MG capsule TAKE 1 CAPSULE (40 MG TOTAL) BY MOUTH 2 (TWO) TIMES DAILY BEFORE A MEAL. (Patient taking differently: Take 40 mg by mouth 2 (two) times daily before a meal. Take one a day, sometimes 2)  . FLUoxetine (PROZAC) 40 MG capsule Take 40 mg by mouth daily.  . hydrochlorothiazide (HYDRODIURIL) 50 MG tablet Take 50 mg by mouth daily.  Marland Kitchen ibuprofen (ADVIL,MOTRIN) 200 MG tablet Take 400-600 mg by mouth 2 (two) times daily as needed for headache or moderate pain.  . metoprolol succinate (TOPROL-XL) 50 MG 24 hr tablet Take 50 mg by mouth daily.  Take with or immediately following a meal.  . nitroGLYCERIN (NITROSTAT) 0.4 MG SL tablet DISSOLVE 1 TABLET UNDER THE TONGUE ONCE FOR EPIGASTRIC PAIN/CHEST PAIN NOT RELIEVED BY PEPPERMINT ALTOIDS. IF THIS DOES NOT RESOLVE PAIN, CALL DR!  Marland Kitchen ondansetron (ZOFRAN-ODT) 4 MG disintegrating tablet TAKE 1 TABLET BY MOUTH EVERY 8 HOURS AS NEEDED FOR NAUSEA AND VOMITING  . rosuvastatin (CRESTOR) 20 MG tablet Take 1 tablet (20 mg total) by mouth daily.  . ticagrelor (BRILINTA) 90 MG TABS tablet Take 1 tablet (90 mg total) by mouth 2 (two) times daily.  . ticagrelor (BRILINTA) 90 MG TABS tablet TAKE 1 TABLET (90 MG TOTAL) BY MOUTH TWO TIMES DAILY.  . valsartan (DIOVAN) 320 MG tablet Take 1 tablet (320 mg total) by mouth daily.  . vitamin B-12 (CYANOCOBALAMIN) 100 MCG tablet Take 100 mcg by mouth daily.   No facility-administered encounter medications on file as of 06/29/2020.   REVIEW OF SYSTEMS  : All other systems reviewed  and negative except where noted in the History of Present Illness.   PHYSICAL EXAM: BP 120/62   Pulse 74   Ht 5\' 4"  (1.626 m)   Wt 213 lb (96.6 kg)   SpO2 94%   BMI 36.56 kg/m  General: Well developed white female in no acute distress Head: Normocephalic and atraumatic Eyes:  Sclerae anicteric, conjunctiva pink. Ears: Normal auditory acuity Lungs: Clear throughout to auscultation; no W/R/R. Heart: Regular rate and rhythm; no M/R/G. Abdomen: Soft, non-distended.  BS present.  Minimal diffuse TTP. Musculoskeletal: Symmetrical with no gross deformities  Skin: No lesions on visible extremities Extremities: No edema  Neurological: Alert oriented x 4, grossly non-focal Psychological:  Alert and cooperative. Normal mood and affect  ASSESSMENT AND PLAN: *75 year old female with complaints of generalized abdominal bloating/distention and gas that causes discomfort.  ? SIBO/IBS.  Will check breath test.  Can use Gas-X prn, which does help.  May want to try IBGard as an  alternate. *GERD/Jackhammer esophagus: Has been taking her Nexium 40 mg once daily, but feels that she probably should be taking it twice a day ongoingly as of late.  Can increase back to twice a day.  Continue diltiazem 60 mg 4 times daily. *CAD with DES placed 04/2020 on Brilinta: Of note, she is due for colonoscopy in October, but may need to be postponed until next spring when she can more safely come off of her Brilinta.  CC:  Harlan Stains, MD

## 2020-06-29 NOTE — Patient Instructions (Signed)
If you are age 75 or older, your body mass index should be between 23-30. Your Body mass index is 36.56 kg/m. If this is out of the aforementioned range listed, please consider follow up with your Primary Care Provider.  If you are age 75 or younger, your body mass index should be between 19-25. Your Body mass index is 36.56 kg/m. If this is out of the aformentioned range listed, please consider follow up with your Primary Care Provider.   Increase Nexium 40 mg twice daily for awhile.   Start IBgard OTC.  You have been given a testing kit to check for small intestine bacterial overgrowth (SIBO) which is completed by a company named [facility]. Make sure to return your test in the mail using the return mailing label given to you along with the kit. Your demographic and insurance information have already been sent to the company and they should be in contact with you over the next week regarding this test. [facility] will collect an upfront charge of $99.74 for commercial insurance plans and $209.74 is you are paying cash. Make sure to discuss with [facility] PRIOR to having the test if they have gotten informatoin from your insurance company as to how much your testing will cost out of pocket, if any. Please keep in mind that you will be getting a call from phone number [phone] or a similar number. If you do not hear from them within this time frame, please call our office at [phone].

## 2020-07-02 ENCOUNTER — Encounter (HOSPITAL_COMMUNITY): Payer: Self-pay | Admitting: *Deleted

## 2020-07-02 ENCOUNTER — Inpatient Hospital Stay (HOSPITAL_COMMUNITY)
Admission: EM | Admit: 2020-07-02 | Discharge: 2020-07-05 | DRG: 291 | Disposition: A | Discharge status: Home / Self Care | Payer: Medicare HMO | Attending: Family Medicine | Admitting: Family Medicine

## 2020-07-02 ENCOUNTER — Emergency Department (HOSPITAL_COMMUNITY): Payer: Medicare HMO

## 2020-07-02 ENCOUNTER — Other Ambulatory Visit: Payer: Self-pay

## 2020-07-02 DIAGNOSIS — I2510 Atherosclerotic heart disease of native coronary artery without angina pectoris: Secondary | ICD-10-CM | POA: Diagnosis present

## 2020-07-02 DIAGNOSIS — I083 Combined rheumatic disorders of mitral, aortic and tricuspid valves: Secondary | ICD-10-CM | POA: Diagnosis not present

## 2020-07-02 DIAGNOSIS — E7800 Pure hypercholesterolemia, unspecified: Secondary | ICD-10-CM | POA: Diagnosis not present

## 2020-07-02 DIAGNOSIS — E669 Obesity, unspecified: Secondary | ICD-10-CM | POA: Diagnosis not present

## 2020-07-02 DIAGNOSIS — Z800 Family history of malignant neoplasm of digestive organs: Secondary | ICD-10-CM

## 2020-07-02 DIAGNOSIS — R0600 Dyspnea, unspecified: Secondary | ICD-10-CM

## 2020-07-02 DIAGNOSIS — Z79899 Other long term (current) drug therapy: Secondary | ICD-10-CM | POA: Diagnosis not present

## 2020-07-02 DIAGNOSIS — Z8673 Personal history of transient ischemic attack (TIA), and cerebral infarction without residual deficits: Secondary | ICD-10-CM | POA: Diagnosis not present

## 2020-07-02 DIAGNOSIS — E876 Hypokalemia: Secondary | ICD-10-CM | POA: Diagnosis not present

## 2020-07-02 DIAGNOSIS — Z8371 Family history of colonic polyps: Secondary | ICD-10-CM

## 2020-07-02 DIAGNOSIS — J849 Interstitial pulmonary disease, unspecified: Secondary | ICD-10-CM | POA: Diagnosis present

## 2020-07-02 DIAGNOSIS — I110 Hypertensive heart disease with heart failure: Principal | ICD-10-CM | POA: Diagnosis present

## 2020-07-02 DIAGNOSIS — Z85828 Personal history of other malignant neoplasm of skin: Secondary | ICD-10-CM

## 2020-07-02 DIAGNOSIS — J8410 Pulmonary fibrosis, unspecified: Secondary | ICD-10-CM | POA: Diagnosis not present

## 2020-07-02 DIAGNOSIS — Z6836 Body mass index (BMI) 36.0-36.9, adult: Secondary | ICD-10-CM | POA: Diagnosis not present

## 2020-07-02 DIAGNOSIS — Z955 Presence of coronary angioplasty implant and graft: Secondary | ICD-10-CM

## 2020-07-02 DIAGNOSIS — J9601 Acute respiratory failure with hypoxia: Secondary | ICD-10-CM | POA: Diagnosis present

## 2020-07-02 DIAGNOSIS — I10 Essential (primary) hypertension: Secondary | ICD-10-CM | POA: Diagnosis present

## 2020-07-02 DIAGNOSIS — R0602 Shortness of breath: Secondary | ICD-10-CM | POA: Diagnosis present

## 2020-07-02 DIAGNOSIS — Z888 Allergy status to other drugs, medicaments and biological substances status: Secondary | ICD-10-CM

## 2020-07-02 DIAGNOSIS — I5031 Acute diastolic (congestive) heart failure: Secondary | ICD-10-CM | POA: Diagnosis not present

## 2020-07-02 DIAGNOSIS — J8489 Other specified interstitial pulmonary diseases: Secondary | ICD-10-CM | POA: Diagnosis present

## 2020-07-02 DIAGNOSIS — I5033 Acute on chronic diastolic (congestive) heart failure: Secondary | ICD-10-CM | POA: Diagnosis present

## 2020-07-02 DIAGNOSIS — Z8049 Family history of malignant neoplasm of other genital organs: Secondary | ICD-10-CM | POA: Diagnosis not present

## 2020-07-02 DIAGNOSIS — R0902 Hypoxemia: Secondary | ICD-10-CM

## 2020-07-02 DIAGNOSIS — Z803 Family history of malignant neoplasm of breast: Secondary | ICD-10-CM

## 2020-07-02 DIAGNOSIS — Z7982 Long term (current) use of aspirin: Secondary | ICD-10-CM

## 2020-07-02 DIAGNOSIS — I509 Heart failure, unspecified: Secondary | ICD-10-CM | POA: Diagnosis not present

## 2020-07-02 DIAGNOSIS — Z20822 Contact with and (suspected) exposure to covid-19: Secondary | ICD-10-CM | POA: Diagnosis not present

## 2020-07-02 DIAGNOSIS — K224 Dyskinesia of esophagus: Secondary | ICD-10-CM | POA: Diagnosis present

## 2020-07-02 DIAGNOSIS — E785 Hyperlipidemia, unspecified: Secondary | ICD-10-CM | POA: Diagnosis present

## 2020-07-02 DIAGNOSIS — R0609 Other forms of dyspnea: Secondary | ICD-10-CM | POA: Diagnosis not present

## 2020-07-02 DIAGNOSIS — K219 Gastro-esophageal reflux disease without esophagitis: Secondary | ICD-10-CM | POA: Diagnosis present

## 2020-07-02 DIAGNOSIS — Z7902 Long term (current) use of antithrombotics/antiplatelets: Secondary | ICD-10-CM

## 2020-07-02 DIAGNOSIS — I351 Nonrheumatic aortic (valve) insufficiency: Secondary | ICD-10-CM | POA: Diagnosis not present

## 2020-07-02 DIAGNOSIS — K449 Diaphragmatic hernia without obstruction or gangrene: Secondary | ICD-10-CM | POA: Diagnosis not present

## 2020-07-02 HISTORY — DX: Other diseases of mediastinum, not elsewhere classified: J98.59

## 2020-07-02 HISTORY — DX: Other specified interstitial pulmonary diseases: J84.89

## 2020-07-02 HISTORY — DX: Nontraumatic subarachnoid hemorrhage, unspecified: I60.9

## 2020-07-02 HISTORY — DX: Atherosclerotic heart disease of native coronary artery without angina pectoris: I25.10

## 2020-07-02 HISTORY — DX: Castleman disease: D47.Z2

## 2020-07-02 HISTORY — DX: Nonrheumatic mitral (valve) prolapse: I34.1

## 2020-07-02 LAB — CBC WITH DIFFERENTIAL/PLATELET
Abs Immature Granulocytes: 0.2 10*3/uL — ABNORMAL HIGH (ref 0.00–0.07)
Basophils Absolute: 0.1 10*3/uL (ref 0.0–0.1)
Basophils Relative: 1 %
Eosinophils Absolute: 0.1 10*3/uL (ref 0.0–0.5)
Eosinophils Relative: 2 %
HCT: 38.5 % (ref 36.0–46.0)
Hemoglobin: 13 g/dL (ref 12.0–15.0)
Immature Granulocytes: 3 %
Lymphocytes Relative: 16 %
Lymphs Abs: 1.1 10*3/uL (ref 0.7–4.0)
MCH: 32.3 pg (ref 26.0–34.0)
MCHC: 33.8 g/dL (ref 30.0–36.0)
MCV: 95.5 fL (ref 80.0–100.0)
Monocytes Absolute: 0.8 10*3/uL (ref 0.1–1.0)
Monocytes Relative: 12 %
Neutro Abs: 4.8 10*3/uL (ref 1.7–7.7)
Neutrophils Relative %: 66 %
Platelets: 358 10*3/uL (ref 150–400)
RBC: 4.03 MIL/uL (ref 3.87–5.11)
RDW: 14 % (ref 11.5–15.5)
WBC: 7.1 10*3/uL (ref 4.0–10.5)
nRBC: 0 % (ref 0.0–0.2)

## 2020-07-02 LAB — BASIC METABOLIC PANEL
Anion gap: 10 (ref 5–15)
BUN: 30 mg/dL — ABNORMAL HIGH (ref 8–23)
CO2: 22 mmol/L (ref 22–32)
Calcium: 9.5 mg/dL (ref 8.9–10.3)
Chloride: 102 mmol/L (ref 98–111)
Creatinine, Ser: 1.06 mg/dL — ABNORMAL HIGH (ref 0.44–1.00)
GFR, Estimated: 55 mL/min — ABNORMAL LOW (ref >60)
Glucose, Bld: 143 mg/dL — ABNORMAL HIGH (ref 70–99)
Potassium: 3.8 mmol/L (ref 3.5–5.1)
Sodium: 134 mmol/L — ABNORMAL LOW (ref 135–145)

## 2020-07-02 LAB — TROPONIN I (HIGH SENSITIVITY)
Troponin I (High Sensitivity): 10 ng/L (ref <18)
Troponin I (High Sensitivity): 10 ng/L (ref <18)

## 2020-07-02 LAB — BRAIN NATRIURETIC PEPTIDE: B Natriuretic Peptide: 23.5 pg/mL (ref 0.0–100.0)

## 2020-07-02 LAB — D-DIMER, QUANTITATIVE: D-Dimer, Quant: 0.86 ug/mL-FEU — ABNORMAL HIGH (ref 0.00–0.50)

## 2020-07-02 LAB — SARS CORONAVIRUS 2 (TAT 6-24 HRS): SARS Coronavirus 2: NEGATIVE

## 2020-07-02 MED ORDER — DILTIAZEM HCL 60 MG PO TABS
60.0000 mg | ORAL_TABLET | Freq: Four times a day (QID) | ORAL | Status: DC
  Administered 2020-07-02 – 2020-07-05 (×11): 60 mg via ORAL
  Filled 2020-07-02 (×12): qty 1

## 2020-07-02 MED ORDER — HYDROCHLOROTHIAZIDE 25 MG PO TABS
50.0000 mg | ORAL_TABLET | Freq: Every day | ORAL | Status: DC
  Filled 2020-07-02 (×2): qty 2

## 2020-07-02 MED ORDER — VITAMIN B-12 100 MCG PO TABS
100.0000 ug | ORAL_TABLET | Freq: Every day | ORAL | Status: DC
  Administered 2020-07-04 – 2020-07-05 (×2): 100 ug via ORAL
  Filled 2020-07-02 (×3): qty 1

## 2020-07-02 MED ORDER — IRBESARTAN 300 MG PO TABS
300.0000 mg | ORAL_TABLET | Freq: Every day | ORAL | Status: DC
  Administered 2020-07-03 – 2020-07-05 (×3): 300 mg via ORAL
  Filled 2020-07-02 (×4): qty 1

## 2020-07-02 MED ORDER — FUROSEMIDE 10 MG/ML IJ SOLN: 40.0000 mg | Freq: Once | INTRAMUSCULAR | Status: DC

## 2020-07-02 MED ORDER — TICAGRELOR 90 MG PO TABS
90.0000 mg | ORAL_TABLET | Freq: Two times a day (BID) | ORAL | Status: DC
  Administered 2020-07-02 – 2020-07-05 (×6): 90 mg via ORAL
  Filled 2020-07-02 (×6): qty 1

## 2020-07-02 MED ORDER — FUROSEMIDE 10 MG/ML IJ SOLN
40.0000 mg | Freq: Once | INTRAMUSCULAR | Status: AC
  Administered 2020-07-02: 40 mg via INTRAVENOUS
  Filled 2020-07-02: qty 4

## 2020-07-02 MED ORDER — METOPROLOL SUCCINATE ER 50 MG PO TB24
50.0000 mg | ORAL_TABLET | Freq: Every day | ORAL | Status: DC
  Administered 2020-07-03 – 2020-07-05 (×3): 50 mg via ORAL
  Filled 2020-07-02 (×3): qty 1

## 2020-07-02 MED ORDER — FLUOXETINE HCL 20 MG PO CAPS
40.0000 mg | ORAL_CAPSULE | Freq: Every day | ORAL | Status: DC
  Administered 2020-07-03 – 2020-07-05 (×3): 40 mg via ORAL
  Filled 2020-07-02 (×3): qty 2

## 2020-07-02 MED ORDER — ASPIRIN EC 81 MG PO TBEC
81.0000 mg | DELAYED_RELEASE_TABLET | Freq: Every day | ORAL | Status: DC
  Administered 2020-07-03 – 2020-07-05 (×3): 81 mg via ORAL
  Filled 2020-07-02 (×3): qty 1

## 2020-07-02 MED ORDER — ENOXAPARIN SODIUM 40 MG/0.4ML IJ SOSY
40.0000 mg | PREFILLED_SYRINGE | Freq: Every day | INTRAMUSCULAR | Status: DC
  Administered 2020-07-03 – 2020-07-04 (×2): 40 mg via SUBCUTANEOUS
  Filled 2020-07-02 (×2): qty 0.4

## 2020-07-02 MED ORDER — ROSUVASTATIN CALCIUM 20 MG PO TABS
20.0000 mg | ORAL_TABLET | Freq: Every day | ORAL | Status: DC
  Administered 2020-07-03 – 2020-07-05 (×3): 20 mg via ORAL
  Filled 2020-07-02 (×3): qty 1

## 2020-07-02 MED ORDER — IOHEXOL 350 MG/ML SOLN
75.0000 mL | Freq: Once | INTRAVENOUS | Status: AC | PRN
  Administered 2020-07-02: 75 mL via INTRAVENOUS

## 2020-07-02 MED ORDER — ACETAMINOPHEN 325 MG PO TABS
650.0000 mg | ORAL_TABLET | Freq: Four times a day (QID) | ORAL | Status: DC | PRN
  Administered 2020-07-04 (×3): 650 mg via ORAL
  Filled 2020-07-02 (×3): qty 2

## 2020-07-02 MED ORDER — NITROGLYCERIN 0.4 MG SL SUBL: 0.4000 mg | SUBLINGUAL_TABLET | SUBLINGUAL | Status: DC | PRN

## 2020-07-02 MED ORDER — ACETAMINOPHEN 650 MG RE SUPP: 650.0000 mg | Freq: Four times a day (QID) | RECTAL | Status: DC | PRN

## 2020-07-02 MED ORDER — PANTOPRAZOLE SODIUM 40 MG PO TBEC
40.0000 mg | DELAYED_RELEASE_TABLET | Freq: Two times a day (BID) | ORAL | Status: DC
  Administered 2020-07-02 – 2020-07-05 (×6): 40 mg via ORAL
  Filled 2020-07-02 (×6): qty 1

## 2020-07-02 NOTE — ED Notes (Signed)
Pt given [place] sandwich and cup of water

## 2020-07-02 NOTE — ED Notes (Signed)
Patient transported to CT 

## 2020-07-02 NOTE — ED Notes (Signed)
Pt ambulated to bathroom and back to room; pt O2 sats 85% and pt c/o sob; O2 sats came up to 88% before O2 @ 1L applied. O2 sats now at 94-95%

## 2020-07-02 NOTE — H&P (Signed)
History and Physical    Alison Ayala:[id number] DOB: 10/11/1945 DOA: 07/02/2020  PCP: Harlan Stains, MD  Patient coming from: Home.  Chief Complaint: Shortness of breath and chest pain.  HPI: Alison Ayala is a 75 y.o. female with history of recent cardiac cath and stent placement on April 09, 2020 about 2 months ago presents to the ER with complaint of increasing shortness of breath and chest pressure over the last 1 week.  Patient attributes her chest discomfort to esophageal spasms and her PPI dose was increased to twice daily by gastroenterologist.  Patient shortness of breath on any minimal exertion.  Denies any orthopnea.  Denies any productive cough fever or chills.  ED Course: In the ER EKG was showing normal sinus rhythm high sensitive troponins and BNP were negative.  CBC was unremarkable.  CT angiogram of the chest done shows mild pulm edema Lasix 40 mg IV was given and given that patient had recent cardiac cath with chest pressure and shortness of breath admitted for further observation.  COVID test was negative.  Review of Systems: As per HPI, rest all negative.   Past Medical History:  Diagnosis Date  . Carpal tunnel syndrome of right wrist   . Diverticulosis   . GERD (gastroesophageal reflux disease)   . H/O hiatal hernia   . H/O intraventricular hemorrhage 2004--  SPONTANEOUS   S/P SHUNT  . Heart murmur   . Hiatal hernia   . Hip pain, right OCCASIONAL -- PINCHED NERVE  . History of gastric ulcer 2001  . Hyperlipidemia   . Hypertension   . Hypoglycemia   . Internal hemorrhoids   . Interstitial lung disease ([place]) 2019   did lung test but has since improved per patient  . [place] esophagus   . MVP (mitral valve prolapse)   . Normal echocardiogram 30 YRS AGO  . S/P ventriculoperitoneal shunt 2004--- PER PT SHUNT IS WORKING WELL --  NO ISSUES  . Squamous cell cancer of scalp and skin of neck    mole back of head  . Tubular adenoma of colon      Past Surgical History:  Procedure Laterality Date  . BILATERAL FRONTAL INTERVENTRICULAR CATHETER VIA TWIST DRILL HOLE  AUG 2004   SPONTANEOUS SUBARACHNOID  HEMORRHAGE AND  INTRAVENTRICULAR HEMORRHAGE  . CARPAL TUNNEL RELEASE  07/26/2011   Procedure: CARPAL TUNNEL RELEASE;  Surgeon: Magnus Sinning, MD;  Location: [facility];  Service: Orthopedics;  Laterality: Right;  . CATARACT EXTRACTION W/ INTRAOCULAR LENS  IMPLANT, BILATERAL  2012   BILATERAL  . CORONARY STENT INTERVENTION N/A 04/08/2020   Procedure: CORONARY STENT INTERVENTION;  Surgeon: Burnell Blanks, MD;  Location: [facility];  Service: Cardiovascular;  Laterality: N/A;  . DUPUYTREN CONTRACTURE RELEASE  07/26/2011   Procedure: DUPUYTREN CONTRACTURE RELEASE;  Surgeon: Magnus Sinning, MD;  Location: [facility];  Service: Orthopedics;  Laterality: Right;  EXCISION OF DUPYTRENS BAND THUMB- INDEX WEB SPACE  . ESOPHAGEAL MANOMETRY N/A 01/27/2019   Procedure: ESOPHAGEAL MANOMETRY (EM);  Surgeon: Jerene Bears, MD;  Location: [facility];  Service: Gastroenterology;  Laterality: N/A;  . EYE SURGERY Bilateral    scrapped corneas  . INTRAVASCULAR IMAGING/OCT N/A 04/08/2020   Procedure: INTRAVASCULAR IMAGING/OCT;  Surgeon: Burnell Blanks, MD;  Location: [facility];  Service: Cardiovascular;  Laterality: N/A;  . LEFT HEART CATH AND CORONARY ANGIOGRAPHY N/A 04/08/2020   Procedure: LEFT HEART CATH AND CORONARY ANGIOGRAPHY;  Surgeon: Burnell Blanks, MD;  Location: [facility];  Service: Cardiovascular;  Laterality: N/A;  . LUNG BIOPSY Right 08/07/2017   Procedure: Open LUNG BIOPSY;  Surgeon: Ivin Poot, MD;  Location: [facility];  Service: Thoracic;  Laterality: Right;  . RESECTION OF MEDIASTINAL MASS N/A 08/07/2017   Procedure: RESECTION OF MEDIASTINAL MASS;  Surgeon: Ivin Poot, MD;  Location: [facility];  Service: Thoracic;  Laterality: N/A;  . RETINAL DETACHMENT  REPAIR W/ SCLERAL BUCKLE LE  03-10-2011   LEFT EYE  . STERNOTOMY N/A 08/07/2017   Procedure: STERNOTOMY;  Surgeon: Prescott Gum, Collier Salina, MD;  Location: [facility];  Service: Thoracic;  Laterality: N/A;  . VENTRICULOPERITONEAL SHUNT  10-08-2002   RIGHT FRONTAL FOR COMMUNICATING HYDROCEPHALUS     reports that she has never smoked. She has never used smokeless tobacco. She reports current alcohol use of about 2.0 standard drinks of alcohol per week. She reports that she does not use drugs.  Allergies  Allergen Reactions  . Carvedilol Palpitations and Other (See Comments)    Abdominal bloating  . Claritin [Loratadine] Other (See Comments)    Blurred vision and dry eyes  . Lipitor [Atorvastatin]     fatigue  . Lisinopril Other (See Comments)    "drunk feeling"    Family History  Problem Relation Age of Onset  . Colon cancer Mother 87  . Colon polyps Mother   . Stomach cancer Maternal Aunt   . Breast cancer Maternal Aunt   . Uterine cancer Maternal Aunt   . Rectal cancer Neg Hx   . Esophageal cancer Neg Hx     Prior to Admission medications   Medication Sig Start Date End Date Taking? Authorizing Provider  acetaminophen (TYLENOL) 500 MG tablet Take 1-2 tablets (500-1,000 mg total) by mouth 2 (two) times daily as needed for moderate pain or headache. 08/10/17   Nani Skillern, PA-C  aspirin EC 81 MG tablet Take 81 mg by mouth daily. Swallow whole.    [provider]  carboxymethylcellulose (REFRESH PLUS) 0.5 % SOLN Place 1 drop into both eyes 2 (two) times daily as needed (dry eyes).    [provider]  Cholecalciferol (VITAMIN D3) 50 MCG (2000 UT) capsule Take 2,000 Units by mouth daily.    [provider]  diltiazem (CARDIZEM) 60 MG tablet TAKE 1 TABLET (60 MG TOTAL) BY MOUTH 4 (FOUR) TIMES DAILY. 05/26/20   Pyrtle, Lajuan Lines, MD  esomeprazole (NEXIUM) 40 MG capsule TAKE 1 CAPSULE (40 MG TOTAL) BY MOUTH 2 (TWO) TIMES DAILY BEFORE A MEAL. Patient taking differently:  Take 40 mg by mouth 2 (two) times daily before a meal. Take one a day, sometimes 2 06/21/20   Pyrtle, Lajuan Lines, MD  FLUoxetine (PROZAC) 40 MG capsule Take 40 mg by mouth daily.    [provider]  hydrochlorothiazide (HYDRODIURIL) 50 MG tablet Take 50 mg by mouth daily.    [provider]  ibuprofen (ADVIL,MOTRIN) 200 MG tablet Take 400-600 mg by mouth 2 (two) times daily as needed for headache or moderate pain.    [provider]  metoprolol succinate (TOPROL-XL) 50 MG 24 hr tablet Take 50 mg by mouth daily. Take with or immediately following a meal.    [provider]  nitroGLYCERIN (NITROSTAT) 0.4 MG SL tablet DISSOLVE 1 TABLET UNDER THE TONGUE ONCE FOR EPIGASTRIC PAIN/CHEST PAIN NOT RELIEVED BY PEPPERMINT ALTOIDS. IF THIS DOES NOT RESOLVE PAIN, CALL DR! 01/06/20   Zenovia Jarred  M, MD  ondansetron (ZOFRAN-ODT) 4 MG disintegrating tablet TAKE 1 TABLET BY MOUTH EVERY 8 HOURS AS NEEDED FOR NAUSEA AND VOMITING 01/06/20   Zehr, Janett Billow D, PA-C  rosuvastatin (CRESTOR) 20 MG tablet Take 1 tablet (20 mg total) by mouth daily. 05/03/20 08/01/20  Richardson Dopp T, PA-C  ticagrelor (BRILINTA) 90 MG TABS tablet Take 1 tablet (90 mg total) by mouth 2 (two) times daily. 05/03/20   Richardson Dopp T, PA-C  ticagrelor (BRILINTA) 90 MG TABS tablet TAKE 1 TABLET (90 MG TOTAL) BY MOUTH TWO TIMES DAILY. 04/08/20 04/08/21  Cheryln Manly, NP  valsartan (DIOVAN) 320 MG tablet Take 1 tablet (320 mg total) by mouth daily. 05/03/20   Richardson Dopp T, PA-C  vitamin B-12 (CYANOCOBALAMIN) 100 MCG tablet Take 100 mcg by mouth daily.    [provider]    Physical Exam: Constitutional: Moderately built and nourished. Vitals:   07/02/20 1800 07/02/20 1900 07/02/20 2000 07/02/20 2125  BP: 125/67 134/60 125/64 (!) 166/66  Pulse: 74 76 81 80  Resp: (!) 23 (!) 21 18 (!) 22  Temp:      TempSrc:      SpO2: 91% 94% 92% (!) 89%  Weight:      Height:       Eyes: Anicteric no pallor. ENMT: No  discharge from the ears eyes nose and mouth. Neck: No mass felt.  No neck rigidity. Respiratory: No rhonchi or crepitations.  JVD not appreciated. Cardiovascular: S1-S2 heard. Abdomen: Soft nontender bowel sounds present. Musculoskeletal: No edema. Skin: No rash. Neurologic: Alert awake oriented time place and person.  Moves all extremities. Psychiatric: Appears normal.  Normal affect.   Labs on Admission: I have personally reviewed following labs and imaging studies  CBC: Recent Labs  Lab 07/02/20 1540  WBC 7.1  NEUTROABS 4.8  HGB 13.0  HCT 38.5  MCV 95.5  PLT 850   Basic Metabolic Panel: Recent Labs  Lab 07/02/20 1540  NA 134*  K 3.8  CL 102  CO2 22  GLUCOSE 143*  BUN 30*  CREATININE 1.06*  CALCIUM 9.5   GFR: Estimated Creatinine Clearance: 51.8 mL/min (A) (by C-G formula based on SCr of 1.06 mg/dL (H)). Liver Function Tests: No results for input(s): AST, ALT, ALKPHOS, BILITOT, PROT, ALBUMIN in the last 168 hours. No results for input(s): LIPASE, AMYLASE in the last 168 hours. No results for input(s): AMMONIA in the last 168 hours. Coagulation Profile: No results for input(s): INR, PROTIME in the last 168 hours. Cardiac Enzymes: No results for input(s): CKTOTAL, CKMB, CKMBINDEX, TROPONINI in the last 168 hours. BNP (last 3 results) No results for input(s): PROBNP in the last 8760 hours. HbA1C: No results for input(s): HGBA1C in the last 72 hours. CBG: No results for input(s): GLUCAP in the last 168 hours. Lipid Profile: No results for input(s): CHOL, HDL, LDLCALC, TRIG, CHOLHDL, LDLDIRECT in the last 72 hours. Thyroid Function Tests: No results for input(s): TSH, T4TOTAL, FREET4, T3FREE, THYROIDAB in the last 72 hours. Anemia Panel: No results for input(s): VITAMINB12, FOLATE, FERRITIN, TIBC, IRON, RETICCTPCT in the last 72 hours. Urine analysis:    Component Value Date/Time   COLORURINE YELLOW 08/01/2017 [place] 08/01/2017 1317    LABSPEC 1.015 08/01/2017 1317   PHURINE 7.0 08/01/2017 1317   GLUCOSEU NEGATIVE 08/01/2017 1317   HGBUR NEGATIVE 08/01/2017 1317   [place] 08/01/2017 1317   KETONESUR NEGATIVE 08/01/2017 1317   PROTEINUR NEGATIVE 08/01/2017 1317   NITRITE NEGATIVE  08/01/2017 1317   LEUKOCYTESUR NEGATIVE 08/01/2017 1317   Sepsis Labs: @LABRCNTIP (procalcitonin:4,lacticidven:4) )No results found for this or any previous visit (from the past 240 hour(s)).   Radiological Exams on Admission: DG Chest 2 View  Result Date: 07/02/2020 CLINICAL DATA:  Shortness of breath.  Symptoms for 3 weeks. EXAM: CHEST - 2 VIEW COMPARISON:  09/12/2017 FINDINGS: Post median sternotomy. Normal heart size and mediastinal contours. Coronary stent. Minor left basilar scarring. No acute airspace disease, pleural effusion, or pneumothorax. No pulmonary edema. No acute osseous abnormalities are seen. IMPRESSION: No acute chest findings. Minor left basilar scarring. Electronically Signed   By: Keith Rake M.D.   On: 07/02/2020 16:14   CT Angio Chest PE W and/or Wo Contrast  Result Date: 07/02/2020 CLINICAL DATA:  Shortness of breath for several weeks EXAM: CT ANGIOGRAPHY CHEST WITH CONTRAST TECHNIQUE: Multidetector CT imaging of the chest was performed using the standard protocol during bolus administration of intravenous contrast. Multiplanar CT image reconstructions and MIPs were obtained to evaluate the vascular anatomy. CONTRAST:  11mL OMNIPAQUE IOHEXOL 350 MG/ML SOLN COMPARISON:  Chest x-ray from earlier in the same day. FINDINGS: Cardiovascular: Thoracic aorta and its branches demonstrate atherosclerotic calcifications without aneurysmal dilatation or dissection. No cardiac enlargement is noted. Coronary calcifications are noted. Pulmonary artery is well visualized with a normal branching pattern. No intraluminal filling defect is identified. Mediastinum/Nodes: Thoracic inlet is within normal limits. No hilar or  mediastinal adenopathy is noted. The esophagus as visualized is within normal limits with the exception of a small sliding-type hiatal hernia. Lungs/Pleura: Lungs are well aerated bilaterally. No focal confluent infiltrate is seen. Some generalized increased hazy density is noted throughout both lungs consistent with mild parenchymal edema. No sizable effusion is seen. Mild emphysematous changes are noted. Upper Abdomen: 19 mm left adrenal lesion is noted and stable from a prior CT from July 2020 consistent with an adenoma. The remainder of the upper abdomen is within normal limits. Musculoskeletal: Degenerative changes of the thoracic spine are noted. No acute bony abnormality is seen. Old rib fractures are noted on the left. Review of the MIP images confirms the above findings. IMPRESSION: No evidence of pulmonary emboli. Changes consistent with mild pulmonary edema and vascular congestion. Small sliding-type hiatal hernia. Stable left adrenal lesion consistent with adenoma. Aortic Atherosclerosis (ICD10-I70.0) and Emphysema (ICD10-J43.9). Electronically Signed   By: Inez Catalina M.D.   On: 07/02/2020 20:31    EKG: Independently reviewed.  Normal sinus rhythm.  Assessment/Plan Principal Problem:   CHF (congestive heart failure) (HCC) Active Problems:   Essential hypertension   NSIP (nonspecific interstitial pneumonia) (HCC)   ILD (interstitial lung disease) (HCC)   CAD in native artery   Status post coronary artery stent placement   Dyspnea on exertion    1. Exertional dyspnea concerning for CHF for which patient was given Lasix 40 mg IV 1 dose.  Recent cardiac cath done in March 2022 showed normal EF.  Will follow intake output Daily weights and metabolic panel and consult cardiology. 2. Chest pain with recent stent placement.  On aspirin Brilinta statins Cardizem metoprolol.  Will consult cardiology.  Patient is also on PPI. 3. History of esophageal spasm on PPI. 4. History of interstitial  lung disease being followed by pulmonologist. 5. Hypertension on ARB Cardizem and metoprolol.   DVT prophylaxis: Lovenox. Code Status: Full code. Family Communication: Discussed with patient. Disposition Plan: Home. Consults called: Cardiology. Admission status: Observation.   Rise Patience MD [facility] Pager [phone]-  [phone].  If 7PM-7AM, please contact night-coverage [email or web address] Password [facility]  07/02/2020, 10:05 PM

## 2020-07-02 NOTE — ED Provider Notes (Signed)
[facility] Provider Note   CSN: [id number] Arrival date & time: 07/02/20  1530     History Chief Complaint  Patient presents with  . Chest Pain  . Shortness of Breath    Al[facility]ala is a 75 y.o. female presents to the Emergency Department complaining of gradual, persistent, progressively worsening shortness of breath onset approximately 3 weeks ago, but worsening in the last few days.  Patient reports recent left heart cath with stent secondary to unstable angina back in March 2022.  Patient reports that her chest pain went away completely for some time and then she began to notice it returning occasionally.  Over the last few weeks her dyspnea on exertion has worsened to the point that she can only walk a few steps before having to stop and catch her breath.  She reports that during these episodes she becomes diaphoretic but does not have nausea.  She has not had any syncopal episodes.  She reports no shortness of breath or chest pain at rest.  No orthopnea.  No swelling of her legs.  She denies chest pain on exertion stating it simply the shortness of breath.  Patient reports compliance with all of her medications.  Takes HCTZ daily but no other diuretic.[facility]  Records reviewed.  Left heart cath on 04/08/2020 found proximal LAD lesion that is 99% stenosed.  At that time her EF was 55-65% patient had normal LV systolic function   The history is provided by the patient and medical records. No language interpreter was used.    HPI: A 75 year old patient with a history of hypertension, hypercholesterolemia and obesity presents for evaluation of chest pain. Initial onset of pain was more than 6 hours ago. The patient's chest pain is described as heaviness/pressure/tightness, is worse with exertion and is relieved by nitroglycerin. The patient reports some diaphoresis. The patient's chest pain is not middle- or left-sided, is not well-localized, is not sharp  and does not radiate to the arms/jaw/neck. The patient does not complain of nausea. The patient has no history of stroke, has no history of peripheral artery disease, has not smoked in the past 90 days, denies any history of treated diabetes and has no relevant family history of coronary artery disease (first degree relative at less than age 76).   Past Medical History:  Diagnosis Date  . Carpal tunnel syndrome of right wrist   . Diverticulosis   . GERD (gastroesophageal reflux disease)   . H/O hiatal hernia   . H/O intraventricular hemorrhage 2004--  SPONTANEOUS   S/P SHUNT  . Heart murmur   . Hiatal hernia   . Hip pain, right OCCASIONAL -- PINCHED NERVE  . History of gastric ulcer 2001  . Hyperlipidemia   . Hypertension   . Hypoglycemia   . Internal hemorrhoids   . Interstitial lung disease ([place]) 2019   did lung test but has since improved per patient  . Jackhammer esophagus   . MVP (mitral valve prolapse)   . Normal echocardiogram 30 YRS AGO  . S/P ventriculoperitoneal shunt 2004--- PER PT SHUNT IS WORKING WELL --  NO ISSUES  . Squamous cell cancer of scalp and skin of neck    mole back of head  . Tubular adenoma of colon     Patient Active Problem List   Diagnosis Date Noted  . CHF (congestive heart failure) ([place]) 07/02/2020  . Flatulence 06/29/2020  . Bloating 06/29/2020  . Generalized abdominal pain 06/29/2020  .  Status post coronary artery stent placement   . Hyperlipidemia LDL goal <70 04/08/2020  . CAD (coronary artery disease) 04/08/2020  . CAD in native artery 04/08/2020  . Unstable angina ([place]) 02/12/2020  . Gastroesophageal reflux disease 01/06/2020  . Atypical chest pain   . Esophageal spasm   . Jackhammer esophagus   . NSIP (nonspecific interstitial pneumonia) ([place]) 10/08/2018  . ILD (interstitial lung disease) ([place]) 10/08/2018  . Healthcare maintenance 10/08/2018  . Right upper quadrant abdominal pain 10/08/2018  . History of lung biopsy 08/08/2017  .  Mediastinal mass 08/07/2017  . Essential hypertension 07/23/2017  . SOB (shortness of breath) 07/23/2017    Past Surgical History:  Procedure Laterality Date  . BILATERAL FRONTAL INTERVENTRICULAR CATHETER VIA TWIST DRILL HOLE  AUG 2004   SPONTANEOUS SUBARACHNOID  HEMORRHAGE AND  INTRAVENTRICULAR HEMORRHAGE  . CARPAL TUNNEL RELEASE  07/26/2011   Procedure: CARPAL TUNNEL RELEASE;  Surgeon: Magnus Sinning, MD;  Location: [place];  Service: Orthopedics;  Laterality: Right;  . CATARACT EXTRACTION W/ INTRAOCULAR LENS  IMPLANT, BILATERAL  2012   BILATERAL  . CORONARY STENT INTERVENTION N/A 04/08/2020   Procedure: CORONARY STENT INTERVENTION;  Surgeon: Burnell Blanks, MD;  Location: [place] [facility];  Service: Cardiovascular;  Laterality: N/A;  . DUPUYTREN CONTRACTURE RELEASE  07/26/2011   Procedure: DUPUYTREN CONTRACTURE RELEASE;  Surgeon: Magnus Sinning, MD;  Location: [place];  Service: Orthopedics;  Laterality: Right;  EXCISION OF DUPYTRENS BAND THUMB- INDEX WEB SPACE  . ESOPHAGEAL MANOMETRY N/A 01/27/2019   Procedure: ESOPHAGEAL MANOMETRY (EM);  Surgeon: Jerene Bears, MD;  Location: [facility];  Service: Gastroenterology;  Laterality: N/A;  . EYE SURGERY Bilateral    scrapped corneas  . INTRAVASCULAR IMAGING/OCT N/A 04/08/2020   Procedure: INTRAVASCULAR IMAGING/OCT;  Surgeon: Burnell Blanks, MD;  Location: [place] [facility];  Service: Cardiovascular;  Laterality: N/A;  . LEFT HEART CATH AND CORONARY ANGIOGRAPHY N/A 04/08/2020   Procedure: LEFT HEART CATH AND CORONARY ANGIOGRAPHY;  Surgeon: Burnell Blanks, MD;  Location: [place] [facility];  Service: Cardiovascular;  Laterality: N/A;  . LUNG BIOPSY Right 08/07/2017   Procedure: Open LUNG BIOPSY;  Surgeon: Ivin Poot, MD;  Location: [place];  Service: Thoracic;  Laterality: Right;  . RESECTION OF MEDIASTINAL MASS N/A 08/07/2017   Procedure: RESECTION OF MEDIASTINAL MASS;   Surgeon: Ivin Poot, MD;  Location: [place];  Service: Thoracic;  Laterality: N/A;  . RETINAL DETACHMENT REPAIR W/ SCLERAL BUCKLE LE  03-10-2011   LEFT EYE  . STERNOTOMY N/A 08/07/2017   Procedure: STERNOTOMY;  Surgeon: Prescott Gum, Collier Salina, MD;  Location: [place];  Service: Thoracic;  Laterality: N/A;  . VENTRICULOPERITONEAL SHUNT  10-08-2002   RIGHT FRONTAL FOR COMMUNICATING HYDROCEPHALUS     OB History   No obstetric history on file.     Family History  Problem Relation Age of Onset  . Colon cancer Mother 46  . Colon polyps Mother   . Stomach cancer Maternal Aunt   . Breast cancer Maternal Aunt   . Uterine cancer Maternal Aunt   . Rectal cancer Neg Hx   . Esophageal cancer Neg Hx     Social History   Tobacco Use  . Smoking status: Never Smoker  . Smokeless tobacco: Never Used  Vaping Use  . Vaping Use: Never used  Substance Use Topics  . Alcohol use: Yes    Alcohol/week: 2.0 standard drinks    Types: 2  Glasses of wine per week    Comment: OCCASIONAL  . Drug use: No    Home Medications Prior to Admission medications   Medication Sig Start Date End Date Taking? Authorizing Provider  acetaminophen (TYLENOL) 500 MG tablet Take 1-2 tablets (500-1,000 mg total) by mouth 2 (two) times daily as needed for moderate pain or headache. 08/10/17   Nani Skillern, PA-C  aspirin EC 81 MG tablet Take 81 mg by mouth daily. Swallow whole.    [provider]  carboxymethylcellulose (REFRESH PLUS) 0.5 % SOLN Place 1 drop into both eyes 2 (two) times daily as needed (dry eyes).    [provider]  Cholecalciferol (VITAMIN D3) 50 MCG (2000 UT) capsule Take 2,000 Units by mouth daily.    [provider]  diltiazem (CARDIZEM) 60 MG tablet TAKE 1 TABLET (60 MG TOTAL) BY MOUTH 4 (FOUR) TIMES DAILY. 05/26/20   Pyrtle, Lajuan Lines, MD  esomeprazole (NEXIUM) 40 MG capsule TAKE 1 CAPSULE (40 MG TOTAL) BY MOUTH 2 (TWO) TIMES DAILY BEFORE A MEAL. Patient taking differently:  Take 40 mg by mouth 2 (two) times daily before a meal. Take one a day, sometimes 2 06/21/20   Pyrtle, Lajuan Lines, MD  FLUoxetine (PROZAC) 40 MG capsule Take 40 mg by mouth daily.    [provider]  hydrochlorothiazide (HYDRODIURIL) 50 MG tablet Take 50 mg by mouth daily.    [provider]  ibuprofen (ADVIL,MOTRIN) 200 MG tablet Take 400-600 mg by mouth 2 (two) times daily as needed for headache or moderate pain.    [provider]  metoprolol succinate (TOPROL-XL) 50 MG 24 hr tablet Take 50 mg by mouth daily. Take with or immediately following a meal.    [provider]  nitroGLYCERIN (NITROSTAT) 0.4 MG SL tablet DISSOLVE 1 TABLET UNDER THE TONGUE ONCE FOR EPIGASTRIC PAIN/CHEST PAIN NOT RELIEVED BY PEPPERMINT ALTOIDS. IF THIS DOES NOT RESOLVE PAIN, CALL DR! 01/06/20   Jerene Bears, MD  ondansetron (ZOFRAN-ODT) 4 MG disintegrating tablet TAKE 1 TABLET BY MOUTH EVERY 8 HOURS AS NEEDED FOR NAUSEA AND VOMITING 01/06/20   Zehr, Janett Billow D, PA-C  rosuvastatin (CRESTOR) 20 MG tablet Take 1 tablet (20 mg total) by mouth daily. 05/03/20 08/01/20  Richardson Dopp T, PA-C  ticagrelor (BRILINTA) 90 MG TABS tablet Take 1 tablet (90 mg total) by mouth 2 (two) times daily. 05/03/20   Richardson Dopp T, PA-C  ticagrelor (BRILINTA) 90 MG TABS tablet TAKE 1 TABLET (90 MG TOTAL) BY MOUTH TWO TIMES DAILY. 04/08/20 04/08/21  Cheryln Manly, NP  valsartan (DIOVAN) 320 MG tablet Take 1 tablet (320 mg total) by mouth daily. 05/03/20   Richardson Dopp T, PA-C  vitamin B-12 (CYANOCOBALAMIN) 100 MCG tablet Take 100 mcg by mouth daily.    [provider]    Allergies    Carvedilol, Claritin [loratadine], Lipitor [atorvastatin], and Lisinopril  Review of Systems   Review of Systems  Constitutional: Negative for appetite change, diaphoresis, fatigue, fever and unexpected weight change.  HENT: Negative for mouth sores.   Eyes: Negative for visual disturbance.  Respiratory: Positive for  shortness of breath. Negative for cough, chest tightness and wheezing.   Cardiovascular: Positive for chest pain.  Gastrointestinal: Negative for abdominal pain, constipation, diarrhea, nausea and vomiting.  Endocrine: Negative for polydipsia, polyphagia and polyuria.  Genitourinary: Negative for dysuria, frequency, hematuria and urgency.  Musculoskeletal: Negative for back pain and neck stiffness.  Skin: Negative for rash.  Allergic/Immunologic: Negative for immunocompromised  state.  Neurological: Negative for syncope, light-headedness and headaches.  Hematological: Does not bruise/bleed easily.  Psychiatric/Behavioral: Negative for sleep disturbance. The patient is not nervous/anxious.     Physical Exam Updated Vital Signs BP 126/75   Pulse 78   Temp 98.8 F (37.1 C) (Oral)   Resp 18   Ht 5\' 4"  (1.626 m)   Wt 96.6 kg   SpO2 95%   BMI 36.56 kg/m   Physical Exam Vitals and nursing note reviewed.  Constitutional:      General: She is not in acute distress.    Appearance: She is not diaphoretic.  HENT:     Head: Normocephalic.  Eyes:     General: No scleral icterus.    Conjunctiva/sclera: Conjunctivae normal.  Cardiovascular:     Rate and Rhythm: Normal rate and regular rhythm.     Pulses: Normal pulses.          Radial pulses are 2+ on the right side and 2+ on the left side.  Pulmonary:     Effort: No tachypnea, accessory muscle usage, prolonged expiration, respiratory distress or retractions.     Breath sounds: No stridor. Examination of the right-lower field reveals rales. Examination of the left-lower field reveals rales. Rales present.     Comments: Equal chest rise. No increased work of breathing. Abdominal:     General: There is no distension.     Palpations: Abdomen is soft.     Tenderness: There is no abdominal tenderness. There is no guarding or rebound.  Musculoskeletal:     Cervical back: Normal range of motion.     Comments: Moves all extremities equally  and without difficulty.  Skin:    General: Skin is warm and dry.     Capillary Refill: Capillary refill takes less than 2 seconds.  Neurological:     Mental Status: She is alert.     GCS: GCS eye subscore is 4. GCS verbal subscore is 5. GCS motor subscore is 6.     Comments: Speech is clear and goal oriented.  Psychiatric:        Mood and Affect: Mood normal.     ED Results / Procedures / Treatments   Labs (all labs ordered are listed, but only abnormal results are displayed) Labs Reviewed  CBC WITH DIFFERENTIAL/PLATELET - Abnormal; Notable for the following components:      Result Value   Abs Immature Granulocytes 0.20 (*)    All other components within normal limits  BASIC METABOLIC PANEL - Abnormal; Notable for the following components:   Sodium 134 (*)    Glucose, Bld 143 (*)    BUN 30 (*)    Creatinine, Ser 1.06 (*)    GFR, Estimated 55 (*)    All other components within normal limits  D-DIMER, QUANTITATIVE - Abnormal; Notable for the following components:   D-Dimer, Quant 0.86 (*)    All other components within normal limits  SARS CORONAVIRUS 2 (TAT 6-24 HRS)  BRAIN NATRIURETIC PEPTIDE  TROPONIN I (HIGH SENSITIVITY)  TROPONIN I (HIGH SENSITIVITY)    EKG EKG Interpretation  Date/Time:  Friday Jul 02 2020 15:43:05 EDT Ventricular Rate:  86 PR Interval:  208 QRS Duration: 90 QT Interval:  364 QTC Calculation: 435 R Axis:   54 Text Interpretation: Normal sinus rhythm Normal ECG Confirmed by Madalyn Rob [phone]) on 07/02/2020 4:58:35 PM   Radiology DG Chest 2 View  Result Date: 07/02/2020 CLINICAL DATA:  Shortness of breath.  Symptoms for 3  weeks. EXAM: CHEST - 2 VIEW COMPARISON:  09/12/2017 FINDINGS: Post median sternotomy. Normal heart size and mediastinal contours. Coronary stent. Minor left basilar scarring. No acute airspace disease, pleural effusion, or pneumothorax. No pulmonary edema. No acute osseous abnormalities are seen. IMPRESSION: No acute chest  findings. Minor left basilar scarring. Electronically Signed   By: Keith Rake M.D.   On: 07/02/2020 16:14   CT Angio Chest PE W and/or Wo Contrast  Result Date: 07/02/2020 CLINICAL DATA:  Shortness of breath for several weeks EXAM: CT ANGIOGRAPHY CHEST WITH CONTRAST TECHNIQUE: Multidetector CT imaging of the chest was performed using the standard protocol during bolus administration of intravenous contrast. Multiplanar CT image reconstructions and MIPs were obtained to evaluate the vascular anatomy. CONTRAST:  59mL OMNIPAQUE IOHEXOL 350 MG/ML SOLN COMPARISON:  Chest x-ray from earlier in the same day. FINDINGS: Cardiovascular: Thoracic aorta and its branches demonstrate atherosclerotic calcifications without aneurysmal dilatation or dissection. No cardiac enlargement is noted. Coronary calcifications are noted. Pulmonary artery is well visualized with a normal branching pattern. No intraluminal filling defect is identified. Mediastinum/Nodes: Thoracic inlet is within normal limits. No hilar or mediastinal adenopathy is noted. The esophagus as visualized is within normal limits with the exception of a small sliding-type hiatal hernia. Lungs/Pleura: Lungs are well aerated bilaterally. No focal confluent infiltrate is seen. Some generalized increased hazy density is noted throughout both lungs consistent with mild parenchymal edema. No sizable effusion is seen. Mild emphysematous changes are noted. Upper Abdomen: 19 mm left adrenal lesion is noted and stable from a prior CT from July 2020 consistent with an adenoma. The remainder of the upper abdomen is within normal limits. Musculoskeletal: Degenerative changes of the thoracic spine are noted. No acute bony abnormality is seen. Old rib fractures are noted on the left. Review of the MIP images confirms the above findings. IMPRESSION: No evidence of pulmonary emboli. Changes consistent with mild pulmonary edema and vascular congestion. Small sliding-type  hiatal hernia. Stable left adrenal lesion consistent with adenoma. Aortic Atherosclerosis (ICD10-I70.0) and Emphysema (ICD10-J43.9). Electronically Signed   By: Inez Catalina M.D.   On: 07/02/2020 20:31    Procedures Procedures   Medications Ordered in ED Medications  iohexol (OMNIPAQUE) 350 MG/ML injection 75 mL (75 mLs Intravenous Contrast Given 07/02/20 2007)  furosemide (LASIX) injection 40 mg (40 mg Intravenous Given 07/02/20 2120)    ED Course  I have reviewed the triage vital signs and the nursing notes.  Pertinent labs & imaging results that were available during my care of the patient were reviewed by me and considered in my medical decision making (see chart for details).  Clinical Course as of 07/02/20 2142  Fri Jul 02, 2020  1748 Creatinine(!): 1.06 BUN and creatinine elevated [HM]  1748 WBC: 7.1 Leukocytosis.  Patient denies recent sick contacts, cough or congestion.  No other infectious symptoms. [HM]    Clinical Course User Index [HM] Irvin Lizama, Gwenlyn Perking   MDM Rules/Calculators/A&P HEAR Score: 6                         Presents with shortness of breath on exertion.  Recent cardiac cath and stent placement.  No history of CHF.  On exam patient has no peripheral edema or calf tenderness however does have rales in the bilateral bases.  Concern for new onset CHF.  BNP within normal limits however elevated D-dimer.  PE is considered part of the differential.  Will obtain CT angiogram.  Patient  ambulated on room air to the bathroom and back.  Upon returning to the room patient found to have oxygen saturations between 85 and 88% on room air.  Placed on oxygen until improvement.  Patient does not require oxygen at rest but does require oxygen with walking.  9:31 PM CT scan without evidence of pulmonary embolism.  Pulmonary edema is noted on CT scan though not clearly noted on chest x-ray.  Will give Lasix and admit.  Patient and husband state understanding and are in  agreement with the plan.  9:42 PM Discussed patient's case with hospitalist, Dr. Hal Hope.  I have recommended admission and patient (and family if present) agree with this plan. Admitting physician will place admission orders.    Final Clinical Impression(s) / ED Diagnoses Final diagnoses:  Acute congestive heart failure, unspecified heart failure type (HCC)  Dyspnea on exertion  Hypoxia    Rx / DC Orders ED Discharge Orders    None       Shenita Trego, Gwenlyn Perking 07/02/20 2142    Lucrezia Starch, MD 07/02/20 2328

## 2020-07-02 NOTE — ED Provider Notes (Signed)
Emergency Medicine Provider Triage Evaluation Note  Alison [facility] , Ayala 75 y.o. female  was evaluated in triage.  Pt complains of shortness of breath x weeks.  Has been seen by pulmonary for Ayala lung disease which she does not know the name of.  Recently had Ayala heart cath 3 weeks ago.  Feels like she has Ayala hoarse voice.  She occasionally gets some chest tightness and shortness of breath with very little movement.  No congestion or rhinorrhea.  Initially thought her hoarse voice and difficulty breathing was due to seasonal allergies.  No PND, vomiting, lower extremity swelling, sensation of throat closing  Review of Systems  Positive: Chest tightness, shortness of breath, hoarse voice Negative:   Physical Exam  There were no vitals taken for this visit. Gen:   Awake, no distress   Resp:  Normal effort, hoarse voice MSK:   Moves extremities without difficulty  Other:   Medical Decision Making  Medically screening exam initiated at 3:36 PM.  Appropriate orders placed.  Alison [facility]ala was informed that the remainder of the evaluation will be completed by another provider, this initial triage assessment does not replace that evaluation, and the importance of remaining in the ED until their evaluation is complete.  Shortness of breath, hoarse voice  Stable   Alison Schindler A, PA-C 07/02/20 [place], Ankit, MD 07/05/20 1544

## 2020-07-02 NOTE — ED Triage Notes (Signed)
Pt c/o being hoarse   And feeling l;ike she is going to pass out when walking for 3 weeks

## 2020-07-03 ENCOUNTER — Encounter (HOSPITAL_COMMUNITY): Payer: Self-pay | Admitting: Internal Medicine

## 2020-07-03 ENCOUNTER — Other Ambulatory Visit (HOSPITAL_COMMUNITY): Payer: Medicare HMO

## 2020-07-03 ENCOUNTER — Inpatient Hospital Stay (HOSPITAL_COMMUNITY): Payer: Medicare HMO

## 2020-07-03 DIAGNOSIS — E785 Hyperlipidemia, unspecified: Secondary | ICD-10-CM | POA: Diagnosis present

## 2020-07-03 DIAGNOSIS — Z7902 Long term (current) use of antithrombotics/antiplatelets: Secondary | ICD-10-CM | POA: Diagnosis not present

## 2020-07-03 DIAGNOSIS — I110 Hypertensive heart disease with heart failure: Secondary | ICD-10-CM | POA: Diagnosis present

## 2020-07-03 DIAGNOSIS — Z8673 Personal history of transient ischemic attack (TIA), and cerebral infarction without residual deficits: Secondary | ICD-10-CM | POA: Diagnosis not present

## 2020-07-03 DIAGNOSIS — K219 Gastro-esophageal reflux disease without esophagitis: Secondary | ICD-10-CM | POA: Diagnosis present

## 2020-07-03 DIAGNOSIS — I5033 Acute on chronic diastolic (congestive) heart failure: Secondary | ICD-10-CM | POA: Diagnosis present

## 2020-07-03 DIAGNOSIS — R0602 Shortness of breath: Secondary | ICD-10-CM | POA: Diagnosis present

## 2020-07-03 DIAGNOSIS — J8410 Pulmonary fibrosis, unspecified: Secondary | ICD-10-CM | POA: Diagnosis present

## 2020-07-03 DIAGNOSIS — Z8049 Family history of malignant neoplasm of other genital organs: Secondary | ICD-10-CM | POA: Diagnosis not present

## 2020-07-03 DIAGNOSIS — E876 Hypokalemia: Secondary | ICD-10-CM | POA: Diagnosis not present

## 2020-07-03 DIAGNOSIS — I351 Nonrheumatic aortic (valve) insufficiency: Secondary | ICD-10-CM | POA: Diagnosis not present

## 2020-07-03 DIAGNOSIS — K224 Dyskinesia of esophagus: Secondary | ICD-10-CM | POA: Diagnosis present

## 2020-07-03 DIAGNOSIS — Z8371 Family history of colonic polyps: Secondary | ICD-10-CM | POA: Diagnosis not present

## 2020-07-03 DIAGNOSIS — Z7982 Long term (current) use of aspirin: Secondary | ICD-10-CM | POA: Diagnosis not present

## 2020-07-03 DIAGNOSIS — Z20822 Contact with and (suspected) exposure to covid-19: Secondary | ICD-10-CM | POA: Diagnosis present

## 2020-07-03 DIAGNOSIS — E7800 Pure hypercholesterolemia, unspecified: Secondary | ICD-10-CM | POA: Diagnosis present

## 2020-07-03 DIAGNOSIS — Z803 Family history of malignant neoplasm of breast: Secondary | ICD-10-CM | POA: Diagnosis not present

## 2020-07-03 DIAGNOSIS — I2510 Atherosclerotic heart disease of native coronary artery without angina pectoris: Secondary | ICD-10-CM | POA: Diagnosis present

## 2020-07-03 DIAGNOSIS — R0609 Other forms of dyspnea: Secondary | ICD-10-CM

## 2020-07-03 DIAGNOSIS — I5031 Acute diastolic (congestive) heart failure: Secondary | ICD-10-CM

## 2020-07-03 DIAGNOSIS — Z85828 Personal history of other malignant neoplasm of skin: Secondary | ICD-10-CM | POA: Diagnosis not present

## 2020-07-03 DIAGNOSIS — Z800 Family history of malignant neoplasm of digestive organs: Secondary | ICD-10-CM | POA: Diagnosis not present

## 2020-07-03 DIAGNOSIS — Z79899 Other long term (current) drug therapy: Secondary | ICD-10-CM | POA: Diagnosis not present

## 2020-07-03 DIAGNOSIS — E669 Obesity, unspecified: Secondary | ICD-10-CM | POA: Diagnosis present

## 2020-07-03 DIAGNOSIS — J9601 Acute respiratory failure with hypoxia: Secondary | ICD-10-CM | POA: Diagnosis present

## 2020-07-03 DIAGNOSIS — I083 Combined rheumatic disorders of mitral, aortic and tricuspid valves: Secondary | ICD-10-CM | POA: Diagnosis present

## 2020-07-03 DIAGNOSIS — Z955 Presence of coronary angioplasty implant and graft: Secondary | ICD-10-CM | POA: Diagnosis not present

## 2020-07-03 DIAGNOSIS — Z6836 Body mass index (BMI) 36.0-36.9, adult: Secondary | ICD-10-CM | POA: Diagnosis not present

## 2020-07-03 LAB — BASIC METABOLIC PANEL
Anion gap: 9 (ref 5–15)
BUN: 23 mg/dL (ref 8–23)
CO2: 25 mmol/L (ref 22–32)
Calcium: 9.5 mg/dL (ref 8.9–10.3)
Chloride: 103 mmol/L (ref 98–111)
Creatinine, Ser: 0.91 mg/dL (ref 0.44–1.00)
GFR, Estimated: >60 mL/min (ref >60)
Glucose, Bld: 126 mg/dL — ABNORMAL HIGH (ref 70–99)
Potassium: 3.6 mmol/L (ref 3.5–5.1)
Sodium: 137 mmol/L (ref 135–145)

## 2020-07-03 LAB — ECHOCARDIOGRAM COMPLETE
AR max vel: 2.8 cm2
AV Area VTI: 2.97 cm2
AV Area mean vel: 2.97 cm2
AV Mean grad: 6 mmHg
AV Peak grad: 10.9 mmHg
Ao pk vel: 1.65 m/s
Area-P 1/2: 3.03 cm2
Height: 64 in
MV VTI: 2.71 cm2
P 1/2 time: 606 msec
S' Lateral: 2.4 cm
Weight: 3403.9 oz

## 2020-07-03 LAB — CBC
HCT: 36.4 % (ref 36.0–46.0)
Hemoglobin: 12.4 g/dL (ref 12.0–15.0)
MCH: 32.3 pg (ref 26.0–34.0)
MCHC: 34.1 g/dL (ref 30.0–36.0)
MCV: 94.8 fL (ref 80.0–100.0)
Platelets: 319 10*3/uL (ref 150–400)
RBC: 3.84 MIL/uL — ABNORMAL LOW (ref 3.87–5.11)
RDW: 14.2 % (ref 11.5–15.5)
WBC: 7.2 10*3/uL (ref 4.0–10.5)
nRBC: 0 % (ref 0.0–0.2)

## 2020-07-03 LAB — MAGNESIUM: Magnesium: 1.8 mg/dL (ref 1.7–2.4)

## 2020-07-03 LAB — TSH: TSH: 3.23 u[IU]/mL (ref 0.350–4.500)

## 2020-07-03 MED ORDER — HYDROCHLOROTHIAZIDE 25 MG PO TABS
12.5000 mg | ORAL_TABLET | Freq: Every day | ORAL | Status: DC
  Administered 2020-07-03 – 2020-07-04 (×2): 12.5 mg via ORAL
  Filled 2020-07-03 (×2): qty 1

## 2020-07-03 MED ORDER — ONDANSETRON 4 MG PO TBDP
4.0000 mg | ORAL_TABLET | Freq: Once | ORAL | Status: AC
  Administered 2020-07-03: 4 mg via ORAL
  Filled 2020-07-03: qty 1

## 2020-07-03 MED ORDER — POTASSIUM CHLORIDE CRYS ER 20 MEQ PO TBCR
40.0000 meq | EXTENDED_RELEASE_TABLET | Freq: Once | ORAL | Status: AC
  Administered 2020-07-03: 40 meq via ORAL
  Filled 2020-07-03: qty 2

## 2020-07-03 MED ORDER — FUROSEMIDE 10 MG/ML IJ SOLN
40.0000 mg | Freq: Every day | INTRAMUSCULAR | Status: DC
  Administered 2020-07-03 – 2020-07-04 (×2): 40 mg via INTRAVENOUS
  Filled 2020-07-03 (×2): qty 4

## 2020-07-03 NOTE — ED Notes (Signed)
Dr. Verlon Au at bedside assessing pt before she gets transported upstairs. Pt was standing in floor to assess O2 while on RA. MD sated she "looks good." However, monitor showed pt fluctuated in her O2, as low as 91% (MD witnessed). 2L O2 via n/c reapplied for transport upstairs.

## 2020-07-03 NOTE — Progress Notes (Addendum)
PROGRESS NOTE   Alison Ayala  XBM:[id number] DOB: [birth year] DOA: 07/02/2020 PCP: Harlan Stains, MD  Brief Narrative:  79 white female CAD-outpatient PTCA-DES status post Synergy XD 4.0X 33 Dr. 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 Darcey Nora Reflux Mitral valve prolapse HTN VP shunt 2/2 [facility] '14  Seen at  gastroenterology office 06/29/2020 for reflux hernia jackhammer esophagus colonic polyps and reported excessive flatulence 2-3 bowel movements a day--entertained IBS given as needed's  Evaluated 07/02/2020 [facility] ED SOB X 3 weeks hoarse voice chest tightness-O2 sat 85 to 88%  CT chest - pulmonary embolism pulmonary edema noted Rx Lasix  Cardiology notified patient admitted  Hospital-Problem based course  Hypoxia query cause DDx heart failure versus lung disease Appreciate cardiology-improved on Lasix x1 no oxygen requirement Outpatient pulmonary follow-up- follow high-res CT chest?-Can also be done outpatient with close follow-up CAD with stent 04/25/2020 Mitral valve prolapse followed by Dr. Acie Fredrickson Brilinta/aspirin and other decisions as per cardiology Current meds: Avapro 300 Toprol-XL 50 Cardizem 60 every 6, aspirin 81, Brilinta 90 twice daily HCTZ dosing changed to 12.5 Follow echo [place] disease status post thymectomy 2019 Outpatient surveillance as per CVTS in outpatient setting Prior subarachnoid with VP shunt Anticoagulation as per cardiology No focal deficits Patient seen in ED and ambulating and looks comfortable    DVT prophylaxis: Lovenox Code Status: Full Family Communication: No family present Disposition:  Status is: Observation  The patient will require care spanning > 2 midnights and should be moved to inpatient because: Hemodynamically unstable, Ongoing active pain requiring inpatient pain management, Altered mental status and Ongoing diagnostic testing needed not appropriate for outpatient work  up  Dispo: The patient is from: Home              Anticipated d/c is to: Home              Patient currently is not medically stable to d/c.   Difficult to place patient No       Consultants:   Cardiology  Procedures: None  Antimicrobials:     Subjective: Seen in ED earlier this morning ambulating Not on oxygen-feels great Reportedly told the nurse she did not want to be admitted? Understands however the need for monitoring No chest pain  Objective: Vitals:   07/03/20 0700 07/03/20 0745 07/03/20 0746 07/03/20 0812  BP: 123/67   (!) 146/72  Pulse: 78 70 72 79  Resp: 20 (!) 27 19 20   Temp:    98.2 F (36.8 C)  TempSrc:    Oral  SpO2: 94% 97% 97% 99%  Weight:    96.5 kg  Height:    5\' 4"  (1.626 m)    Intake/Output Summary (Last 24 hours) at 07/03/2020 1118 Last data filed at 07/03/2020 1100 Gross per 24 hour  Intake 120 ml  Output 100 ml  Net 20 ml   Filed Weights   07/02/20 1540 07/03/20 0812  Weight: 96.6 kg 96.5 kg    Examination:  Coherent pleasant looks younger than stated age Chest clear Trace lower extremity edema Abdomen soft no rebound Power 5/5  Data Reviewed: personally reviewed   CBC    Component Value Date/Time   WBC 7.2 07/03/2020 0538   RBC 3.84 (L) 07/03/2020 0538   HGB 12.4 07/03/2020 0538   HGB 14.3 04/05/2020 1355   HCT 36.4 07/03/2020 0538   HCT 43.2 04/05/2020 1355   PLT 319 07/03/2020 0538   PLT 257 04/05/2020 1355   MCV 94.8  07/03/2020 0538   MCV 94 04/05/2020 1355   MCH 32.3 07/03/2020 0538   MCHC 34.1 07/03/2020 0538   RDW 14.2 07/03/2020 0538   RDW 13.0 04/05/2020 1355   LYMPHSABS 1.1 07/02/2020 1540   MONOABS 0.8 07/02/2020 1540   EOSABS 0.1 07/02/2020 1540   BASOSABS 0.1 07/02/2020 1540   CMP Latest Ref Rng & Units 07/03/2020 07/02/2020 05/17/2020  Glucose 70 - 99 mg/dL 126(H) 143(H) 90  BUN 8 - 23 mg/dL 23 30(H) 22  Creatinine 0.44 - 1.00 mg/dL 0.91 1.06(H) 0.88  Sodium 135 - 145 mmol/L 137 134(L) 140   Potassium 3.5 - 5.1 mmol/L 3.6 3.8 4.1  Chloride 98 - 111 mmol/L 103 102 101  CO2 22 - 32 mmol/L 25 22 22   Calcium 8.9 - 10.3 mg/dL 9.5 9.5 10.0  Total Protein 6.0 - 8.3 g/dL - - -  Total Bilirubin 0.2 - 1.2 mg/dL - - -  Alkaline Phos 39 - 117 U/L - - -  AST 0 - 37 U/L - - -  ALT 0 - 35 U/L - - -     Radiology Studies: DG Chest 2 View  Result Date: 07/02/2020 CLINICAL DATA:  Shortness of breath.  Symptoms for 3 weeks. EXAM: CHEST - 2 VIEW COMPARISON:  09/12/2017 FINDINGS: Post median sternotomy. Normal heart size and mediastinal contours. Coronary stent. Minor left basilar scarring. No acute airspace disease, pleural effusion, or pneumothorax. No pulmonary edema. No acute osseous abnormalities are seen. IMPRESSION: No acute chest findings. Minor left basilar scarring. Electronically Signed   By: Keith Rake M.D.   On: 07/02/2020 16:14   CT Angio Chest PE W and/or Wo Contrast  Result Date: 07/02/2020 CLINICAL DATA:  Shortness of breath for several weeks EXAM: CT ANGIOGRAPHY CHEST WITH CONTRAST TECHNIQUE: Multidetector CT imaging of the chest was performed using the standard protocol during bolus administration of intravenous contrast. Multiplanar CT image reconstructions and MIPs were obtained to evaluate the vascular anatomy. CONTRAST:  56mL OMNIPAQUE IOHEXOL 350 MG/ML SOLN COMPARISON:  Chest x-ray from earlier in the same day. FINDINGS: Cardiovascular: Thoracic aorta and its branches demonstrate atherosclerotic calcifications without aneurysmal dilatation or dissection. No cardiac enlargement is noted. Coronary calcifications are noted. Pulmonary artery is well visualized with a normal branching pattern. No intraluminal filling defect is identified. Mediastinum/Nodes: Thoracic inlet is within normal limits. No hilar or mediastinal adenopathy is noted. The esophagus as visualized is within normal limits with the exception of a small sliding-type hiatal hernia. Lungs/Pleura: Lungs are well  aerated bilaterally. No focal confluent infiltrate is seen. Some generalized increased hazy density is noted throughout both lungs consistent with mild parenchymal edema. No sizable effusion is seen. Mild emphysematous changes are noted. Upper Abdomen: 19 mm left adrenal lesion is noted and stable from a prior CT from July 2020 consistent with an adenoma. The remainder of the upper abdomen is within normal limits. Musculoskeletal: Degenerative changes of the thoracic spine are noted. No acute bony abnormality is seen. Old rib fractures are noted on the left. Review of the MIP images confirms the above findings. IMPRESSION: No evidence of pulmonary emboli. Changes consistent with mild pulmonary edema and vascular congestion. Small sliding-type hiatal hernia. Stable left adrenal lesion consistent with adenoma. Aortic Atherosclerosis (ICD10-I70.0) and Emphysema (ICD10-J43.9). Electronically Signed   By: Inez Catalina M.D.   On: 07/02/2020 20:31     Scheduled Meds: . aspirin EC  81 mg Oral Daily  . diltiazem  60 mg Oral Q6H  . enoxaparin (  LOVENOX) injection  40 mg Subcutaneous QHS  . FLUoxetine  40 mg Oral Daily  . furosemide  40 mg Intravenous Daily  . irbesartan  300 mg Oral Daily  . metoprolol succinate  50 mg Oral Daily  . pantoprazole  40 mg Oral BID  . rosuvastatin  20 mg Oral Daily  . ticagrelor  90 mg Oral BID  . vitamin B-12  100 mcg Oral Daily   Continuous Infusions:   LOS: 0 days   Time spent: [place], MD [facility] To contact the attending provider between 7A-7P or the covering provider during after hours 7P-7A, please log into the web site [email or web address] and access using universal [place] password for that web site. If you do not have the password, please call the hospital operator.  07/03/2020, 11:18 AM

## 2020-07-03 NOTE — Progress Notes (Signed)
K 3.6 therefore will give 31meq KCl today and f/u labs in AM.

## 2020-07-03 NOTE — Consult Note (Addendum)
Cardiology Consultation:   Patient ID: SEARA HINESLEY MRN: [id number]; DOB: 30-Jul-1945  Admit date: 07/02/2020 Date of Consult: 07/03/2020  PCP:  Harlan Stains, MD   [facility] Providers Cardiologist:  Mertie Moores, MD        Patient Profile:   DEVLIN MCVEIGH is a 75 y.o. non-smoking female with a hx of CAD s/p DES to LAD 04/2020, HTN, HLD, mild MV prolapse, mediastinal mass s/p resection in 2019 (path c/w Castleman's disease), large spontaneous subarachnoid hemorrhage in 2004 (first manifested by headache), VP shunt, ILD/NSIP (previously followed by Dr. Chase Caller), GERD, Jackhammer esophagus who is being seen 07/03/2020 for the evaluation of chest pain and SOB at the request of Dr. Hal Hope.  History of Present Illness:   Ms. Ke was previously followed by Dr. Acie Fredrickson for mild mitral valve prolapse last assessed by echo in 2019. She has a history of chest pain, also complicated by esophageal spasm treated with CCB in the past. In early 2022 she was evaluated for worsening chest pain different than prior esophageal sx with abnormal coronary CT. She ultimately underwent cardiac cath 04/2020 showing 99% ostial/prox LAD treated with DES, no obstructive disease otherwise, normal LV function. She was discharged on ASA + Brilinta with plan or 6 months but preferably 1 year if tolerating well - if necessary, Brilinta could be changed to Plavix in 3 months. She also has seen pulmonology in the past for possible ILD - last high res CT 08/2018 stated "Previously seen bilateral, bibasilar predominant ground-glass opacities on CT dated 07/09/2017 and PET-CT dated 07/16/2017 are mostly resolved. There is very minimal subpleural reticular and ground-glass opacity without a clear gradient. Mild lobular air trapping on expiratory phase imaging. Findings remain in keeping with very mild NSIP pattern fibrosis as demonstrated by wedge biopsy histology. More extensive ground-glass present on prior  examinations may have reflected acute inflammatory flare of interstitial lung disease or other incidental nonspecific infection or inflammation present at that time. Consider ongoing follow-up to assess for long-term stability of fibrotic findings." Last pulm OV was in 10/2018 with plan for 6 month f/u but did not occur.  She presents back to the hospital with 3 week hx of exertional dyspnea. She has also felt a hoarse voice. She has no dyspnea at rest or with recumbency. She notices it specifically every time she gets up to start walking. She has to stop and hold onto something because she feels lightheaded as if she might pass out. She does still have occasional chest pain that she felt was c/w her esophageal spasm that is relieved with her diltiazem but these have not escalated recently. She states she has not had any chest discomfort that was reminiscent of what she had prior to PCI. No LEE, weight changes, fever, chills, cough, palpitations or syncope. No hx Covid that she is aware of. In the ED she was found to be hypoxic with ambulation at 85%, requiring supplemental O2.  Initial labs with mild hyponatremia 134, BUN 30/Cr 1.06, normal CBC, normal BNP, normal troponin, d-dimer mildly elevated, TSH wnl, Covid negative. CTA negative for PE, + possible mild pulmonary edema/vascular congestion, small sliding type hiatal hernia, stable left adrenal lesion c/w adenoma, emphysema, aortic atherosclerosis. EKG NSR, no acute changes. She was treated with IV Lasix 40mg  overnight, still noted to have borderline O2 sats on RA this AM (91%).   Past Medical History:  Diagnosis Date  . CAD (coronary artery disease)    a. s/p DES to LAD 04/2020.  Marland Kitchen  Carpal tunnel syndrome of right wrist   . 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 Diverticulosis   . GERD (gastroesophageal reflux disease)   . H/O hiatal hernia   . H/O intraventricular hemorrhage 2004--  SPONTANEOUS   S/P SHUNT  . Heart murmur   . Hiatal hernia   . Hip pain,  right OCCASIONAL -- PINCHED NERVE  . History of gastric ulcer 2001  . Hyperlipidemia   . Hypertension   . Hypoglycemia   . Internal hemorrhoids   . Interstitial lung disease ([place])   . Jackhammer esophagus   . Mediastinal mass   . Mitral valve prolapse   . Normal echocardiogram 30 YRS AGO  . NSIP (nonspecific interstitial pneumonia) ([place])   . S/P ventriculoperitoneal shunt 2004--- PER PT SHUNT IS WORKING WELL --  NO ISSUES  . SAH (subarachnoid hemorrhage) ([place])   . Squamous cell cancer of scalp and skin of neck    mole back of head  . Tubular adenoma of colon     Past Surgical History:  Procedure Laterality Date  . BILATERAL FRONTAL INTERVENTRICULAR CATHETER VIA TWIST DRILL HOLE  AUG 2004   SPONTANEOUS SUBARACHNOID  HEMORRHAGE AND  INTRAVENTRICULAR HEMORRHAGE  . CARPAL TUNNEL RELEASE  07/26/2011   Procedure: CARPAL TUNNEL RELEASE;  Surgeon: Magnus Sinning, MD;  Location: [place];  Service: Orthopedics;  Laterality: Right;  . CATARACT EXTRACTION W/ INTRAOCULAR LENS  IMPLANT, BILATERAL  2012   BILATERAL  . CORONARY STENT INTERVENTION N/A 04/08/2020   Procedure: CORONARY STENT INTERVENTION;  Surgeon: Burnell Blanks, MD;  Location: [place] [facility];  Service: Cardiovascular;  Laterality: N/A;  . DUPUYTREN CONTRACTURE RELEASE  07/26/2011   Procedure: DUPUYTREN CONTRACTURE RELEASE;  Surgeon: Magnus Sinning, MD;  Location: [place];  Service: Orthopedics;  Laterality: Right;  EXCISION OF DUPYTRENS BAND THUMB- INDEX WEB SPACE  . ESOPHAGEAL MANOMETRY N/A 01/27/2019   Procedure: ESOPHAGEAL MANOMETRY (EM);  Surgeon: Jerene Bears, MD;  Location: [facility];  Service: Gastroenterology;  Laterality: N/A;  . EYE SURGERY Bilateral    scrapped corneas  . INTRAVASCULAR IMAGING/OCT N/A 04/08/2020   Procedure: INTRAVASCULAR IMAGING/OCT;  Surgeon: Burnell Blanks, MD;  Location: [place] [facility];  Service: Cardiovascular;  Laterality: N/A;   . LEFT HEART CATH AND CORONARY ANGIOGRAPHY N/A 04/08/2020   Procedure: LEFT HEART CATH AND CORONARY ANGIOGRAPHY;  Surgeon: Burnell Blanks, MD;  Location: [place] [facility];  Service: Cardiovascular;  Laterality: N/A;  . LUNG BIOPSY Right 08/07/2017   Procedure: Open LUNG BIOPSY;  Surgeon: Ivin Poot, MD;  Location: [place];  Service: Thoracic;  Laterality: Right;  . RESECTION OF MEDIASTINAL MASS N/A 08/07/2017   Procedure: RESECTION OF MEDIASTINAL MASS;  Surgeon: Ivin Poot, MD;  Location: [place];  Service: Thoracic;  Laterality: N/A;  . RETINAL DETACHMENT REPAIR W/ SCLERAL BUCKLE LE  03-10-2011   LEFT EYE  . STERNOTOMY N/A 08/07/2017   Procedure: STERNOTOMY;  Surgeon: Prescott Gum, Collier Salina, MD;  Location: [place];  Service: Thoracic;  Laterality: N/A;  . VENTRICULOPERITONEAL SHUNT  10-08-2002   RIGHT FRONTAL FOR COMMUNICATING HYDROCEPHALUS     Home Medications:  Prior to Admission medications   Medication Sig Start Date End Date Taking? Authorizing Provider  acetaminophen (TYLENOL) 500 MG tablet Take 1-2 tablets (500-1,000 mg total) by mouth 2 (two) times daily as needed for moderate pain or headache. 08/10/17  Yes Nani Skillern, PA-C  aspirin EC 81 MG tablet Take  81 mg by mouth daily. Swallow whole.   Yes [provider]  carboxymethylcellulose (REFRESH PLUS) 0.5 % SOLN Place 1 drop into both eyes 2 (two) times daily as needed (dry eyes).   Yes [provider]  Cholecalciferol (VITAMIN D3) 50 MCG (2000 UT) capsule Take 2,000 Units by mouth daily.   Yes [provider]  diltiazem (CARDIZEM) 60 MG tablet TAKE 1 TABLET (60 MG TOTAL) BY MOUTH 4 (FOUR) TIMES DAILY. 05/26/20  Yes Pyrtle, Lajuan Lines, MD  dimenhyDRINATE (DRAMAMINE) 50 MG tablet Take 25 mg by mouth every 8 (eight) hours as needed (sleep/anxiety).   Yes [provider]  esomeprazole (NEXIUM) 40 MG capsule TAKE 1 CAPSULE (40 MG TOTAL) BY MOUTH 2 (TWO) TIMES DAILY BEFORE A MEAL. 06/21/20  Yes  Pyrtle, Lajuan Lines, MD  FLUoxetine (PROZAC) 40 MG capsule Take 40 mg by mouth daily.   Yes [provider]  hydrochlorothiazide (HYDRODIURIL) 50 MG tablet Take 50 mg by mouth daily.   Yes [provider]  ibuprofen (ADVIL,MOTRIN) 200 MG tablet Take 400-600 mg by mouth 2 (two) times daily as needed for headache or moderate pain.   Yes [provider]  metoprolol succinate (TOPROL-XL) 50 MG 24 hr tablet Take 50 mg by mouth daily. Take with or immediately following a meal.   Yes [provider]  nitroGLYCERIN (NITROSTAT) 0.4 MG SL tablet DISSOLVE 1 TABLET UNDER THE TONGUE ONCE FOR EPIGASTRIC PAIN/CHEST PAIN NOT RELIEVED BY PEPPERMINT ALTOIDS. IF THIS DOES NOT RESOLVE PAIN, CALL DR! Patient taking differently: Place 0.4 mg under the tongue every 5 (five) minutes as needed for chest pain. 01/06/20  Yes Pyrtle, Lajuan Lines, MD  ondansetron (ZOFRAN-ODT) 4 MG disintegrating tablet TAKE 1 TABLET BY MOUTH EVERY 8 HOURS AS NEEDED FOR NAUSEA AND VOMITING Patient taking differently: Take 4 mg by mouth every 8 (eight) hours as needed for vomiting or nausea. 01/06/20  Yes Zehr, Laban Emperor, PA-C  rosuvastatin (CRESTOR) 20 MG tablet Take 1 tablet (20 mg total) by mouth daily. 05/03/20 08/01/20 Yes Weaver, Scott T, PA-C  ticagrelor (BRILINTA) 90 MG TABS tablet Take 1 tablet (90 mg total) by mouth 2 (two) times daily. 05/03/20  Yes Weaver, Scott T, PA-C  valsartan (DIOVAN) 320 MG tablet Take 1 tablet (320 mg total) by mouth daily. 05/03/20  Yes Weaver, Scott T, PA-C  vitamin B-12 (CYANOCOBALAMIN) 100 MCG tablet Take 100 mcg by mouth daily.   Yes [provider]  ticagrelor (BRILINTA) 90 MG TABS tablet TAKE 1 TABLET (90 MG TOTAL) BY MOUTH TWO TIMES DAILY. Patient taking differently: Take 90 mg by mouth 2 (two) times daily. 04/08/20 04/08/21  Cheryln Manly, NP    Inpatient Medications: Scheduled Meds: . aspirin EC  81 mg Oral Daily  . diltiazem  60 mg Oral Q6H  . enoxaparin (LOVENOX)  injection  40 mg Subcutaneous QHS  . FLUoxetine  40 mg Oral Daily  . hydrochlorothiazide  50 mg Oral Daily  . irbesartan  300 mg Oral Daily  . metoprolol succinate  50 mg Oral Daily  . pantoprazole  40 mg Oral BID  . rosuvastatin  20 mg Oral Daily  . ticagrelor  90 mg Oral BID  . vitamin B-12  100 mcg Oral Daily   Continuous Infusions:  PRN Meds: acetaminophen **OR** acetaminophen, nitroGLYCERIN  Allergies:    Allergies  Allergen Reactions  . Carvedilol Palpitations and Other (See Comments)    Abdominal bloating  . Claritin [Loratadine] Other (See Comments)  Blurred vision and dry eyes  . Lipitor [Atorvastatin]     fatigue  . Lisinopril Other (See Comments)    "drunk feeling"    Social History:   Social History   Socioeconomic History  . Marital status: Married    Spouse name: Timmothy Sours  . Number of children: 0  . Years of education: Not on file  . Highest education level: Not on file  Occupational History  . Occupation: retired  Tobacco Use  . Smoking status: Never Smoker  . Smokeless tobacco: Never Used  Vaping Use  . Vaping Use: Never used  Substance and Sexual Activity  . Alcohol use: Yes    Alcohol/week: 2.0 standard drinks    Types: 2 Glasses of wine per week    Comment: OCCASIONAL  . Drug use: No  . Sexual activity: Not on file  Other Topics Concern  . Not on file  Social History Narrative  . Not on file   Social Determinants of Health   Financial Resource Strain: Not on file  Food Insecurity: Not on file  Transportation Needs: Not on file  Physical Activity: Not on file  Stress: Not on file  Social Connections: Not on file  Intimate Partner Violence: Not on file    Family History:    Family History  Problem Relation Age of Onset  . Colon cancer Mother 64  . Colon polyps Mother   . Stomach cancer Maternal Aunt   . Breast cancer Maternal Aunt   . Uterine cancer Maternal Aunt   . Rectal cancer Neg Hx   . Esophageal cancer Neg Hx       ROS:  Please see the history of present illness.  Occasional sweating in the AM. All other ROS reviewed and negative.     Physical Exam/Data:   Vitals:   07/03/20 0700 07/03/20 0745 07/03/20 0746 07/03/20 0812  BP: 123/67   (!) 146/72  Pulse: 78 70 72 79  Resp: 20 (!) 27 19 20   Temp:    98.2 F (36.8 C)  TempSrc:    Oral  SpO2: 94% 97% 97% 99%  Weight:    96.5 kg  Height:    5\' 4"  (1.626 m)   No intake or output data in the 24 hours ending 07/03/20 0906 Last 3 Weights 07/03/2020 07/02/2020 06/29/2020  Weight (lbs) 212 lb 11.9 oz 212 lb 15.4 oz 213 lb  Weight (kg) 96.5 kg 96.6 kg 96.616 kg     Body mass index is 36.52 kg/m.  General: Well developed, well nourished, in no acute distress Head: Normocephalic, atraumatic, sclera non-icteric, no xanthomas, nares are without discharge. Neck: Negative for carotid bruits. JVP not elevated. Lungs: Clear bilaterally to auscultation without wheezes, rales, or rhonchi. Breathing is unlabored. Heart: RRR S1 S2 2/6 SEM predominantly at RUSB without rubs or gallops.  Abdomen: Soft, non-tender, non-distended with normoactive bowel sounds. No rebound/guarding. Extremities: No clubbing or cyanosis. No edema. Distal pedal pulses are 2+ and equal bilaterally. Neuro: Alert and oriented X 3. Moves all extremities spontaneously. Psych:  Responds to questions appropriately with a normal affect.  EKG:  The EKG was personally reviewed and demonstrates:  NSR 86bpm no acute STT changes  Telemetry:  Telemetry was personally reviewed and demonstrates:  NSR  Relevant CV Studies:  Cardiac Cath 04/07/20  Ost LAD to Prox LAD lesion is 99% stenosed.  A drug-eluting stent was successfully placed using a SYNERGY XD 4.0X16.  Post intervention, there is a 0% residual stenosis.  The left ventricular systolic function is normal.  LV end diastolic pressure is normal.  The left ventricular ejection fraction is 55-65% by visual estimate.  There is no  mitral valve regurgitation.   1. Severe ostial/proximal LAD stenosis 2. Successful PTCA/DES x 1 ostial/proximal LAD with optimization with OCT imaging 3. No obstructive disease in the large dominant Circumflex artery.  4. Small non-dominant RCA 5. Normal LV systolic function  Recommendations: Continue DAPT with ASA and Brilinta for at least six months but preferably one year if she tolerates it well. If necessary, the Brilinta could be changed to Plavix in 3 months. Same day post PCI discharge.   2D echo 07/2017 - Left ventricle: The cavity size was normal. There was mild focal  basal hypertrophy of the septum. Systolic function was normal.  The estimated ejection fraction was in the range of 55% to 60%.  Wall motion was normal; there were no regional wall motion  abnormalities.  - Aortic valve: Trileaflet; mildly thickened, mildly calcified  leaflets. There was mild regurgitation.  - Mitral valve: Mild prolapse, involving the anterior leaflet.  There was trivial regurgitation.  - Left atrium: The atrium was mildly dilated.  - Tricuspid valve: There was trivial regurgitation.   Laboratory Data:  High Sensitivity Troponin:   Recent Labs  Lab 07/02/20 1540 07/02/20 1832  TROPONINIHS 10 10     Chemistry Recent Labs  Lab 07/02/20 1540 07/03/20 0538  NA 134* 137  K 3.8 3.6  CL 102 103  CO2 22 25  GLUCOSE 143* 126*  BUN 30* 23  CREATININE 1.06* 0.91  CALCIUM 9.5 9.5  GFRNONAA 55* >60  ANIONGAP 10 9    No results for input(s): PROT, ALBUMIN, AST, ALT, ALKPHOS, BILITOT in the last 168 hours. Hematology Recent Labs  Lab 07/02/20 1540 07/03/20 0538  WBC 7.1 7.2  RBC 4.03 3.84*  HGB 13.0 12.4  HCT 38.5 36.4  MCV 95.5 94.8  MCH 32.3 32.3  MCHC 33.8 34.1  RDW 14.0 14.2  PLT 358 319   BNP Recent Labs  Lab 07/02/20 1832  BNP 23.5    DDimer  Recent Labs  Lab 07/02/20 1832  DDIMER 0.86*     Radiology/Studies:  DG Chest 2 View  Result Date:  07/02/2020 CLINICAL DATA:  Shortness of breath.  Symptoms for 3 weeks. EXAM: CHEST - 2 VIEW COMPARISON:  09/12/2017 FINDINGS: Post median sternotomy. Normal heart size and mediastinal contours. Coronary stent. Minor left basilar scarring. No acute airspace disease, pleural effusion, or pneumothorax. No pulmonary edema. No acute osseous abnormalities are seen. IMPRESSION: No acute chest findings. Minor left basilar scarring. Electronically Signed   By: Keith Rake M.D.   On: 07/02/2020 16:14   CT Angio Chest PE W and/or Wo Contrast  Result Date: 07/02/2020 CLINICAL DATA:  Shortness of breath for several weeks EXAM: CT ANGIOGRAPHY CHEST WITH CONTRAST TECHNIQUE: Multidetector CT imaging of the chest was performed using the standard protocol during bolus administration of intravenous contrast. Multiplanar CT image reconstructions and MIPs were obtained to evaluate the vascular anatomy. CONTRAST:  25mL OMNIPAQUE IOHEXOL 350 MG/ML SOLN COMPARISON:  Chest x-ray from earlier in the same day. FINDINGS: Cardiovascular: Thoracic aorta and its branches demonstrate atherosclerotic calcifications without aneurysmal dilatation or dissection. No cardiac enlargement is noted. Coronary calcifications are noted. Pulmonary artery is well visualized with a normal branching pattern. No intraluminal filling defect is identified. Mediastinum/Nodes: Thoracic inlet is within normal limits. No hilar or mediastinal adenopathy is noted. The esophagus  as visualized is within normal limits with the exception of a small sliding-type hiatal hernia. Lungs/Pleura: Lungs are well aerated bilaterally. No focal confluent infiltrate is seen. Some generalized increased hazy density is noted throughout both lungs consistent with mild parenchymal edema. No sizable effusion is seen. Mild emphysematous changes are noted. Upper Abdomen: 19 mm left adrenal lesion is noted and stable from a prior CT from July 2020 consistent with an adenoma. The  remainder of the upper abdomen is within normal limits. Musculoskeletal: Degenerative changes of the thoracic spine are noted. No acute bony abnormality is seen. Old rib fractures are noted on the left. Review of the MIP images confirms the above findings. IMPRESSION: No evidence of pulmonary emboli. Changes consistent with mild pulmonary edema and vascular congestion. Small sliding-type hiatal hernia. Stable left adrenal lesion consistent with adenoma. Aortic Atherosclerosis (ICD10-I70.0) and Emphysema (ICD10-J43.9). Electronically Signed   By: Inez Catalina M.D.   On: 07/02/2020 20:31     Assessment and Plan:   1. Dyspnea on exertion with evidence of exertional hypoxia - CTA negative for PE but some question of vascular congestion - however, BNP is normal and clinically does not appear markedly volume overloaded (weight actually down 8lb from prior weight) - trial of diuresis not unreasonable but would suggest to investigate pulmonary sources as well, such as interval worsening of ILD/NSIP - Brilinta can cause dyspnea but this usually occurs at random without a reproducible exertional pattern and is not usually associated with significant hypoxia - will check echocardiogram to evaluate for structural changes and valvular disease  2. CAD s/p DES to LAD 04/2020 - troponins reassuring and EKG normal - continue ASA, BB, statin - will discuss plan for Brilinta with MD given prior [facility] (medical contraindication)  3. ILD/NSIP - consider pulm eval for hypoxia  4. Mild MVP - repeat echocardiogram for murmur  5. Jackhammer esophagus - continue home diltiazem  - troponins negative  6. Essential HTN - manage in context above   Risk Assessment/Risk Scores:     HEAR Score (for undifferentiated chest pain):  HEAR Score: 4  New York Heart Association (NYHA) Functional Class NYHA Class II-III  For questions or updates, please contact [place] Please consult [email or web address] for contact info  under    Signed, Charlie Pitter, PA-C  07/03/2020 9:06 AM As above, patient seen and examined.  Briefly she is a 75 year old female with past medical history of coronary artery disease, hypertension, hyperlipidemia, previous mediastinal mass status post resection, subarachnoid hemorrhage with VP shunt, interstitial lung disease, nutcracker esophagus for evaluation of acute on chronic diastolic congestive heart failure.  Patient is status post PCI of LAD March 2022.  Patient states that for the past 3 weeks she has had increasing dyspnea on exertion.  No orthopnea, PND, pedal edema, palpitations or syncope.  She denies fevers, chills, productive cough or hemoptysis.  She does have epigastric pain that she attributes to esophageal spasm.  She does not feel as though she has had symptoms similar to her previous cardiac pain.  She has been admitted and cardiology asked to evaluate.  Note she has noticed some improvement with 1 dose of IV Lasix last evening. Electrocardiogram shows normal sinus rhythm with no ST changes.  CTA shows no pulmonary embolus, COPD, pulmonary edema.  Troponin is 10 and 10.  BNP 23.  White blood cell count 7.2.  1 acute diastolic congestive heart failure-patient is not markedly volume overloaded on examination.  However she is symptomatically improved with  diuresis.  We will continue Lasix 40 mg IV daily and follow renal function.  We will plan echocardiogram to reassess LV function.  I am not convinced that all of her dyspnea is related to CHF.  She does have a history of interstitial lung disease by previous CT.  We will plan on high-resolution CT scan in the next 24 to 48 hours to rule out progression of lung disease.  2 coronary artery disease-continue aspirin, Brilinta and statin.  Note she does have a history of intracranial hemorrhage.  I would therefore recommend discontinuing Brilinta after 3 months of therapy and treat with aspirin and Plavix to complete dual antiplatelet  therapy for 1 year.  She would then be on aspirin 81 mg daily thereafter.  3 hypertension-continue preadmission blood pressure medications (HCTZ is on hold with addition of Lasix).  4 hyperlipidemia-continue statin.  5 history of nutcracker esophagus-continue Cardizem.  Kirk Ruths, MD

## 2020-07-03 NOTE — Hospital Course (Addendum)
20 white female CAD-outpatient PTCA-DES status post Synergy XD 4.0X 16 Dr. Daneen Schick on 04/08/2020//Brilinta/ASA currently Castleman disease Thymectomy 08/08/2017 Dr. Darcey Nora Reflux Mitral valve prolapse HTN VP shunt  Seen at  gastroenterology office 06/29/2020 for reflux hernia jackhammer esophagus colonic polyps and reported excessive flatulence 2-3 bowel movements a day--entertained IBS given as needed's  Evaluated 07/02/2020 [facility] ED SOB X 3 weeks hoarse voice chest tightness-O2 sat 85 to 88%  CT chest - pulmonary embolism pulmonary edema noted Rx Lasix  Cardiology notified patient admitted

## 2020-07-03 NOTE — Progress Notes (Signed)
  Echocardiogram 2D Echocardiogram has been performed.  Alison Ayala 07/03/2020, 5:48 PM

## 2020-07-04 ENCOUNTER — Inpatient Hospital Stay (HOSPITAL_COMMUNITY): Payer: Medicare HMO

## 2020-07-04 DIAGNOSIS — I5031 Acute diastolic (congestive) heart failure: Secondary | ICD-10-CM | POA: Diagnosis not present

## 2020-07-04 LAB — CBC
HCT: 38 % (ref 36.0–46.0)
Hemoglobin: 12.3 g/dL (ref 12.0–15.0)
MCH: 31.5 pg (ref 26.0–34.0)
MCHC: 32.4 g/dL (ref 30.0–36.0)
MCV: 97.4 fL (ref 80.0–100.0)
Platelets: 379 10*3/uL (ref 150–400)
RBC: 3.9 MIL/uL (ref 3.87–5.11)
RDW: 14.4 % (ref 11.5–15.5)
WBC: 8.6 10*3/uL (ref 4.0–10.5)
nRBC: 0 % (ref 0.0–0.2)

## 2020-07-04 LAB — BASIC METABOLIC PANEL
Anion gap: 9 (ref 5–15)
BUN: 24 mg/dL — ABNORMAL HIGH (ref 8–23)
CO2: 27 mmol/L (ref 22–32)
Calcium: 9.8 mg/dL (ref 8.9–10.3)
Chloride: 102 mmol/L (ref 98–111)
Creatinine, Ser: 1.11 mg/dL — ABNORMAL HIGH (ref 0.44–1.00)
GFR, Estimated: 52 mL/min — ABNORMAL LOW (ref >60)
Glucose, Bld: 114 mg/dL — ABNORMAL HIGH (ref 70–99)
Potassium: 4.5 mmol/L (ref 3.5–5.1)
Sodium: 138 mmol/L (ref 135–145)

## 2020-07-04 MED ORDER — FUROSEMIDE 40 MG PO TABS
40.0000 mg | ORAL_TABLET | Freq: Every day | ORAL | Status: DC
  Administered 2020-07-05: 40 mg via ORAL
  Filled 2020-07-04: qty 1

## 2020-07-04 NOTE — Progress Notes (Signed)
Progress Note  Patient Name: Alison Ayala Date of Encounter: 07/04/2020  [facility] Cardiologist: Mertie Moores, MD   Subjective   No CP; dyspnea improving  Inpatient Medications    Scheduled Meds: . aspirin EC  81 mg Oral Daily  . diltiazem  60 mg Oral Q6H  . enoxaparin (LOVENOX) injection  40 mg Subcutaneous QHS  . FLUoxetine  40 mg Oral Daily  . furosemide  40 mg Intravenous Daily  . hydrochlorothiazide  12.5 mg Oral Daily  . irbesartan  300 mg Oral Daily  . metoprolol succinate  50 mg Oral Daily  . pantoprazole  40 mg Oral BID  . rosuvastatin  20 mg Oral Daily  . ticagrelor  90 mg Oral BID  . vitamin B-12  100 mcg Oral Daily   Continuous Infusions:  PRN Meds: acetaminophen **OR** acetaminophen, nitroGLYCERIN   Vital Signs    Vitals:   07/03/20 2001 07/04/20 0022 07/04/20 0558 07/04/20 0818  BP: (!) 142/61 (!) 130/56 110/70 (!) 120/45  Pulse: 70 67 74 68  Resp: 16 16 18    Temp: 98.6 F (37 C) 98.3 F (36.8 C) 98.6 F (37 C)   TempSrc: Oral Oral Oral   SpO2: 98% 98% 95%   Weight:   97 kg   Height:        Intake/Output Summary (Last 24 hours) at 07/04/2020 0833 Last data filed at 07/04/2020 0023 Gross per 24 hour  Intake 840 ml  Output 800 ml  Net 40 ml   Last 3 Weights 07/04/2020 07/03/2020 07/02/2020  Weight (lbs) 213 lb 14.4 oz 212 lb 11.9 oz 212 lb 15.4 oz  Weight (kg) 97.024 kg 96.5 kg 96.6 kg      Telemetry    Sinus- Personally Reviewed  Physical Exam   GEN: No acute distress.   Neck: No JVD Cardiac: RRR  Respiratory: Clear to auscultation bilaterally. GI: Soft, nontender, non-distended  MS: No edema Neuro:  Nonfocal  Psych: Normal affect   Labs    High Sensitivity Troponin:   Recent Labs  Lab 07/02/20 1540 07/02/20 1832  TROPONINIHS 10 10      Chemistry Recent Labs  Lab 07/02/20 1540 07/03/20 0538 07/04/20 0618  NA 134* 137 138  K 3.8 3.6 4.5  CL 102 103 102  CO2 22 25 27   GLUCOSE 143* 126* 114*  BUN 30*  23 24*  CREATININE 1.06* 0.91 1.11*  CALCIUM 9.5 9.5 9.8  GFRNONAA 55* >60 52*  ANIONGAP 10 9 9      Hematology Recent Labs  Lab 07/02/20 1540 07/03/20 0538 07/04/20 0618  WBC 7.1 7.2 8.6  RBC 4.03 3.84* 3.90  HGB 13.0 12.4 12.3  HCT 38.5 36.4 38.0  MCV 95.5 94.8 97.4  MCH 32.3 32.3 31.5  MCHC 33.8 34.1 32.4  RDW 14.0 14.2 14.4  PLT 358 319 379    BNP Recent Labs  Lab 07/02/20 1832  BNP 23.5     DDimer  Recent Labs  Lab 07/02/20 1832  DDIMER 0.86*     Radiology    DG Chest 2 View  Result Date: 07/02/2020 CLINICAL DATA:  Shortness of breath.  Symptoms for 3 weeks. EXAM: CHEST - 2 VIEW COMPARISON:  09/12/2017 FINDINGS: Post median sternotomy. Normal heart size and mediastinal contours. Coronary stent. Minor left basilar scarring. No acute airspace disease, pleural effusion, or pneumothorax. No pulmonary edema. No acute osseous abnormalities are seen. IMPRESSION: No acute chest findings. Minor left basilar scarring. Electronically Signed  By: Keith Rake M.D.   On: 07/02/2020 16:14   CT Angio Chest PE W and/or Wo Contrast  Result Date: 07/02/2020 CLINICAL DATA:  Shortness of breath for several weeks EXAM: CT ANGIOGRAPHY CHEST WITH CONTRAST TECHNIQUE: Multidetector CT imaging of the chest was performed using the standard protocol during bolus administration of intravenous contrast. Multiplanar CT image reconstructions and MIPs were obtained to evaluate the vascular anatomy. CONTRAST:  51mL OMNIPAQUE IOHEXOL 350 MG/ML SOLN COMPARISON:  Chest x-ray from earlier in the same day. FINDINGS: Cardiovascular: Thoracic aorta and its branches demonstrate atherosclerotic calcifications without aneurysmal dilatation or dissection. No cardiac enlargement is noted. Coronary calcifications are noted. Pulmonary artery is well visualized with a normal branching pattern. No intraluminal filling defect is identified. Mediastinum/Nodes: Thoracic inlet is within normal limits. No hilar or  mediastinal adenopathy is noted. The esophagus as visualized is within normal limits with the exception of a small sliding-type hiatal hernia. Lungs/Pleura: Lungs are well aerated bilaterally. No focal confluent infiltrate is seen. Some generalized increased hazy density is noted throughout both lungs consistent with mild parenchymal edema. No sizable effusion is seen. Mild emphysematous changes are noted. Upper Abdomen: 19 mm left adrenal lesion is noted and stable from a prior CT from July 2020 consistent with an adenoma. The remainder of the upper abdomen is within normal limits. Musculoskeletal: Degenerative changes of the thoracic spine are noted. No acute bony abnormality is seen. Old rib fractures are noted on the left. Review of the MIP images confirms the above findings. IMPRESSION: No evidence of pulmonary emboli. Changes consistent with mild pulmonary edema and vascular congestion. Small sliding-type hiatal hernia. Stable left adrenal lesion consistent with adenoma. Aortic Atherosclerosis (ICD10-I70.0) and Emphysema (ICD10-J43.9). Electronically Signed   By: Inez Catalina M.D.   On: 07/02/2020 20:31   ECHOCARDIOGRAM COMPLETE  Result Date: 07/03/2020    ECHOCARDIOGRAM REPORT   Patient Name:   LAVANDA NEVELS Bufford Date of Exam: 07/03/2020 Medical Rec #:  [id number]         Height:       64.0 in Accession #:    [id number]        Weight:       212.7 lb Date of Birth:  1945/03/14         BSA:          2.008 m Patient Age:    75 years          BP:           127/64 mmHg Patient Gender: F                 HR:           60 bpm. Exam Location:  Inpatient Procedure: 2D Echo, Cardiac Doppler and Color Doppler Indications:    Dyspnea  History:        Patient has prior history of Echocardiogram examinations, most                 recent 07/25/2017. CAD, Mitral Valve Disease and Aortic Valve                 Disease; Risk Factors:Hypertension and Dyslipidemia. S/P DES to                 LAD 04/2020. GERD. DOE.  Sonographer:     Clayton Lefort RDCS (AE) Referring Phys: [place]  1. Left ventricular ejection fraction, by estimation, is 60 to 65%. The left ventricle has  normal function. The left ventricle has no regional wall motion abnormalities. There is moderate left ventricular hypertrophy of the basal-septal segment. Left ventricular diastolic parameters are consistent with Grade I diastolic dysfunction (impaired relaxation).  2. Right ventricular systolic function is normal. The right ventricular size is normal. There is mildly elevated pulmonary artery systolic pressure. The estimated right ventricular systolic pressure is 13.0 mmHg.  3. The mitral valve is normal in structure. Mild mitral valve regurgitation. No evidence of mitral stenosis.  4. The aortic valve is normal in structure. There is mild calcification of the aortic valve. There is mild thickening of the aortic valve. Aortic valve regurgitation is mild to moderate. Mild to moderate aortic valve sclerosis/calcification is present, without any evidence of aortic stenosis. Aortic valve mean gradient measures 6.0 mmHg. Aortic valve Vmax measures 1.65 m/s.  5. The inferior vena cava is normal in size with greater than 50% respiratory variability, suggesting right atrial pressure of 3 mmHg. FINDINGS  Left Ventricle: Left ventricular ejection fraction, by estimation, is 60 to 65%. The left ventricle has normal function. The left ventricle has no regional wall motion abnormalities. The left ventricular internal cavity size was normal in size. There is  moderate left ventricular hypertrophy of the basal-septal segment. Left ventricular diastolic parameters are consistent with Grade I diastolic dysfunction (impaired relaxation). Right Ventricle: The right ventricular size is normal. No increase in right ventricular wall thickness. Right ventricular systolic function is normal. There is mildly elevated pulmonary artery systolic pressure. The tricuspid regurgitant  velocity is 2.97  m/s, and with an assumed right atrial pressure of 3 mmHg, the estimated right ventricular systolic pressure is 86.5 mmHg. Left Atrium: Left atrial size was normal in size. Right Atrium: Right atrial size was normal in size. Pericardium: There is no evidence of pericardial effusion. Mitral Valve: The mitral valve is normal in structure. Mild mitral valve regurgitation. No evidence of mitral valve stenosis. MV peak gradient, 5.1 mmHg. The mean mitral valve gradient is 2.0 mmHg. Tricuspid Valve: The tricuspid valve is normal in structure. Tricuspid valve regurgitation is trivial. No evidence of tricuspid stenosis. Aortic Valve: The aortic valve is normal in structure. There is mild calcification of the aortic valve. There is mild thickening of the aortic valve. Aortic valve regurgitation is mild to moderate. Aortic regurgitation PHT measures 606 msec. Mild to moderate aortic valve sclerosis/calcification is present, without any evidence of aortic stenosis. Aortic valve mean gradient measures 6.0 mmHg. Aortic valve peak gradient measures 10.9 mmHg. Aortic valve area, by VTI measures 2.97 cm. Pulmonic Valve: The pulmonic valve was normal in structure. Pulmonic valve regurgitation is not visualized. No evidence of pulmonic stenosis. Aorta: The aortic root is normal in size and structure. Venous: The inferior vena cava is normal in size with greater than 50% respiratory variability, suggesting right atrial pressure of 3 mmHg. IAS/Shunts: No atrial level shunt detected by color flow Doppler.  LEFT VENTRICLE PLAX 2D LVIDd:         4.30 cm  Diastology LVIDs:         2.40 cm  LV e' medial:    7.07 cm/s LV PW:         1.00 cm  LV E/e' medial:  11.3 LV IVS:        1.60 cm  LV e' lateral:   7.62 cm/s LVOT diam:     2.00 cm  LV E/e' lateral: 10.5 LV SV:         101 LV SV Index:  51 LVOT Area:     3.14 cm  RIGHT VENTRICLE             IVC RV Basal diam:  3.30 cm     IVC diam: 1.20 cm RV S prime:     12.10 cm/s  TAPSE (M-mode): 2.0 cm LEFT ATRIUM             Index       RIGHT ATRIUM           Index LA diam:        3.30 cm 1.64 cm/m  RA Area:     18.20 cm LA Vol (A2C):   65.8 ml 32.76 ml/m RA Volume:   54.00 ml  26.89 ml/m LA Vol (A4C):   46.9 ml 23.35 ml/m LA Biplane Vol: 60.7 ml 30.22 ml/m  AORTIC VALVE AV Area (Vmax):    2.80 cm AV Area (Vmean):   2.97 cm AV Area (VTI):     2.97 cm AV Vmax:           165.00 cm/s AV Vmean:          111.000 cm/s AV VTI:            0.342 m AV Peak Grad:      10.9 mmHg AV Mean Grad:      6.0 mmHg LVOT Vmax:         147.00 cm/s LVOT Vmean:        105.000 cm/s LVOT VTI:          0.323 m LVOT/AV VTI ratio: 0.94 AI PHT:            606 msec  AORTA Ao Root diam: 3.00 cm Ao Asc diam:  3.10 cm MITRAL VALVE               TRICUSPID VALVE MV Area (PHT): 3.03 cm    TR Peak grad:   35.3 mmHg MV Area VTI:   2.71 cm    TR Vmax:        297.00 cm/s MV Peak grad:  5.1 mmHg MV Mean grad:  2.0 mmHg    SHUNTS MV Vmax:       1.13 m/s    Systemic VTI:  0.32 m MV Vmean:      63.4 cm/s   Systemic Diam: 2.00 cm MV Decel Time: 250 msec MV E velocity: 79.70 cm/s MV A velocity: 97.30 cm/s MV E/A ratio:  0.82 Candee Furbish MD Electronically signed by Candee Furbish MD Signature Date/Time: 07/03/2020/6:44:26 PM    Final     Patient Profile     75 year old female with past medical history of coronary artery disease, hypertension, hyperlipidemia, previous mediastinal mass status post resection, subarachnoid hemorrhage with VP shunt, interstitial lung disease, nutcracker esophagus for evaluation of acute on chronic diastolic congestive heart failure and dyspnea.  Patient is status post PCI of LAD March 2022. CTA shows no pulmonary embolus, COPD, pulmonary edema. BNP 23.    Assessment & Plan    1 acute diastolic congestive heart failure-patient did not appear to be markedly volume overloaded on initial exam and BNP 23; however she has improved with diuresis.  We will change Lasix to 40 mg by mouth daily.  Note  echocardiogram shows normal LV function.  I wonder whether interstitial lung disease could be contributing (this was noted on previous CT scan).  Would arrange high-resolution CT either as an inpatient or outpatient and follow-up with pulmonary.  Patient may require  home oxygen.    2 coronary artery disease-continue aspirin, Brilinta and statin.  Note she does have a history of intracranial hemorrhage.  I would therefore recommend discontinuing Brilinta after 3 months of therapy completed and treat with aspirin and Plavix to complete dual antiplatelet therapy for 1 year.  She would then be on aspirin 81 mg daily thereafter.  3 hypertension-continue preadmission blood pressure medications other than HCTZ has been discontinued and Lasix added.  She will need potassium and renal function checked 1 week following discharge.  4 hyperlipidemia-continue statin.  5 history of nutcracker esophagus-continue Cardizem.  For questions or updates, please contact [place] Please consult [email or web address] for contact info under        Signed, Kirk Ruths, MD  07/04/2020, 8:33 AM

## 2020-07-04 NOTE — Progress Notes (Signed)
PROGRESS NOTE   Alison Ayala  NKN:[id number] DOB: 17-Oct-1945 DOA: 07/02/2020 PCP: Harlan Stains, MD  Brief Narrative:  26 white female CAD-outpatient PTCA-DES status post Synergy XD 4.0X 16 Dr. 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 Darcey Nora Reflux Mitral valve prolapse HTN VP shunt 2/2 [facility] '14  Seen at  gastroenterology office 06/29/2020 for reflux hernia jackhammer esophagus colonic polyps and reported excessive flatulence 2-3 bowel movements a day--entertained IBS given as needed's  Evaluated 07/02/2020 [facility] ED SOB X 3 weeks hoarse voice chest tightness-O2 sat 85 to 88%  CT chest - pulmonary embolism pulmonary edema noted Rx Lasix  Cardiology notified patient admitted  Hospital-Problem based course  Hypoxia query cause DDx heart failure versus lung disease Current meds: Lasix p.o. 40 daily--although BNP low, Echo normal has empirically improved on this? Ordered high-res CT chest--CC Dr. Chase Caller pulmonary for close follow-up if any abnormalities CAD with stent 04/25/2020 Mitral valve prolapse followed by Dr. Acie Fredrickson Brilinta/aspirin and other decisions as per cardiology Current meds: Avapro 300 Toprol-XL 50 Cardizem 60 every 6, aspirin 81, Brilinta 90 twice daily HCTZ DC 5/29 Castleman disease status post thymectomy 2019 Outpatient surveillance as per CVTS in outpatient setting Prior subarachnoid with VP shunt Anticoagulation as per cardiology No focal deficits Patient seen in ED and ambulating and looks comfortable    DVT prophylaxis: Lovenox Code Status: Full Family Communication: No family present Disposition:  Status is: Observation  The patient will require care spanning > 2 midnights and should be moved to inpatient because: Hemodynamically unstable, Ongoing active pain requiring inpatient pain management, Altered mental status and Ongoing diagnostic testing needed not appropriate for outpatient work  up  Dispo: The patient is from: Home              Anticipated d/c is to: Home              Patient currently is not medically stable to d/c.   Difficult to place patient No    Consultants:   Cardiology  Procedures: None  Antimicrobials:     Subjective:  Feels much better overall has some swelling in her leg still using oxygen-needs desat screen-no chest pain-eating drinking  Objective: Vitals:   07/04/20 0022 07/04/20 0558 07/04/20 0818 07/04/20 1204  BP: (!) 130/56 110/70 (!) 120/45 (!) 109/51  Pulse: 67 74 68 61  Resp: 16 18 18 20   Temp: 98.3 F (36.8 C) 98.6 F (37 C)  97.8 F (36.6 C)  TempSrc: Oral Oral  Oral  SpO2: 98% 95% 95% 98%  Weight:  97 kg    Height:        Intake/Output Summary (Last 24 hours) at 07/04/2020 1330 Last data filed at 07/04/2020 1300 Gross per 24 hour  Intake 1200 ml  Output 1500 ml  Net -300 ml   Filed Weights   07/02/20 1540 07/03/20 0812 07/04/20 0558  Weight: 96.6 kg 96.5 kg 97 kg    Examination:  Coherent pleasant conversant no chest pain Mallampati 4 No JVD S1-S2 no murmur Chest clear  Power 5/5  Data Reviewed: personally reviewed   CBC    Component Value Date/Time   WBC 8.6 07/04/2020 0618   RBC 3.90 07/04/2020 0618   HGB 12.3 07/04/2020 0618   HGB 14.3 04/05/2020 1355   HCT 38.0 07/04/2020 0618   HCT 43.2 04/05/2020 1355   PLT 379 07/04/2020 0618   PLT 257 04/05/2020 1355   MCV 97.4 07/04/2020 0618   MCV 94 04/05/2020  1355   MCH 31.5 07/04/2020 0618   MCHC 32.4 07/04/2020 0618   RDW 14.4 07/04/2020 0618   RDW 13.0 04/05/2020 1355   LYMPHSABS 1.1 07/02/2020 1540   MONOABS 0.8 07/02/2020 1540   EOSABS 0.1 07/02/2020 1540   BASOSABS 0.1 07/02/2020 1540   CMP Latest Ref Rng & Units 07/04/2020 07/03/2020 07/02/2020  Glucose 70 - 99 mg/dL 114(H) 126(H) 143(H)  BUN 8 - 23 mg/dL 24(H) 23 30(H)  Creatinine 0.44 - 1.00 mg/dL 1.11(H) 0.91 1.06(H)  Sodium 135 - 145 mmol/L 138 137 134(L)  Potassium 3.5 - 5.1  mmol/L 4.5 3.6 3.8  Chloride 98 - 111 mmol/L 102 103 102  CO2 22 - 32 mmol/L 27 25 22   Calcium 8.9 - 10.3 mg/dL 9.8 9.5 9.5  Total Protein 6.0 - 8.3 g/dL - - -  Total Bilirubin 0.2 - 1.2 mg/dL - - -  Alkaline Phos 39 - 117 U/L - - -  AST 0 - 37 U/L - - -  ALT 0 - 35 U/L - - -     Radiology Studies: DG Chest 2 View  Result Date: 07/02/2020 CLINICAL DATA:  Shortness of breath.  Symptoms for 3 weeks. EXAM: CHEST - 2 VIEW COMPARISON:  09/12/2017 FINDINGS: Post median sternotomy. Normal heart size and mediastinal contours. Coronary stent. Minor left basilar scarring. No acute airspace disease, pleural effusion, or pneumothorax. No pulmonary edema. No acute osseous abnormalities are seen. IMPRESSION: No acute chest findings. Minor left basilar scarring. Electronically Signed   By: Keith Rake M.D.   On: 07/02/2020 16:14   CT Angio Chest PE W and/or Wo Contrast  Result Date: 07/02/2020 CLINICAL DATA:  Shortness of breath for several weeks EXAM: CT ANGIOGRAPHY CHEST WITH CONTRAST TECHNIQUE: Multidetector CT imaging of the chest was performed using the standard protocol during bolus administration of intravenous contrast. Multiplanar CT image reconstructions and MIPs were obtained to evaluate the vascular anatomy. CONTRAST:  93mL OMNIPAQUE IOHEXOL 350 MG/ML SOLN COMPARISON:  Chest x-ray from earlier in the same day. FINDINGS: Cardiovascular: Thoracic aorta and its branches demonstrate atherosclerotic calcifications without aneurysmal dilatation or dissection. No cardiac enlargement is noted. Coronary calcifications are noted. Pulmonary artery is well visualized with a normal branching pattern. No intraluminal filling defect is identified. Mediastinum/Nodes: Thoracic inlet is within normal limits. No hilar or mediastinal adenopathy is noted. The esophagus as visualized is within normal limits with the exception of a small sliding-type hiatal hernia. Lungs/Pleura: Lungs are well aerated bilaterally. No  focal confluent infiltrate is seen. Some generalized increased hazy density is noted throughout both lungs consistent with mild parenchymal edema. No sizable effusion is seen. Mild emphysematous changes are noted. Upper Abdomen: 19 mm left adrenal lesion is noted and stable from a prior CT from July 2020 consistent with an adenoma. The remainder of the upper abdomen is within normal limits. Musculoskeletal: Degenerative changes of the thoracic spine are noted. No acute bony abnormality is seen. Old rib fractures are noted on the left. Review of the MIP images confirms the above findings. IMPRESSION: No evidence of pulmonary emboli. Changes consistent with mild pulmonary edema and vascular congestion. Small sliding-type hiatal hernia. Stable left adrenal lesion consistent with adenoma. Aortic Atherosclerosis (ICD10-I70.0) and Emphysema (ICD10-J43.9). Electronically Signed   By: Inez Catalina M.D.   On: 07/02/2020 20:31   CT Chest High Resolution  Result Date: 07/04/2020 CLINICAL DATA:  75 year old female with history of chest pain and shortness of breath. Evaluate for interstitial lung disease. EXAM: CT CHEST  WITHOUT CONTRAST TECHNIQUE: Multidetector CT imaging of the chest was performed following the standard protocol without intravenous contrast. High resolution imaging of the lungs, as well as inspiratory and expiratory imaging, was performed. COMPARISON:  Chest CTA 07/02/2020. FINDINGS: Comment: Today's study is limited by considerable patient respiratory motion. Cardiovascular: Heart size is normal. There is no significant pericardial fluid, thickening or pericardial calcification. There is aortic atherosclerosis, as well as atherosclerosis of the great vessels of the mediastinum and the coronary arteries, including calcified atherosclerotic plaque in the left main, left anterior descending and right coronary arteries. Calcifications of the aortic valve. Mediastinum/Nodes: No pathologically enlarged  mediastinal or hilar lymph nodes. Please note that accurate exclusion of hilar adenopathy is limited on noncontrast CT scans. Small hiatal hernia. No axillary lymphadenopathy. Lungs/Pleura: On today's motion limited examination there does appear to be widespread but patchy areas of ground-glass attenuation. On inspiratory images which are less affected by motion, there is some very mild septal thickening and scattered areas of mild cylindrical bronchiectasis. Findings do appear to have a mild craniocaudal gradient. Inspiratory and expiratory imaging is otherwise unremarkable. No acute consolidative airspace disease. No pleural effusions. Upper Abdomen: Aortic atherosclerosis. 2.1 x 1.7 cm low-attenuation (8 Hounsfield unit) left adrenal nodule, similar to prior studies, compatible with a benign adenoma. Aortic atherosclerosis. Musculoskeletal: Median sternotomy wires. There are no aggressive appearing lytic or blastic lesions noted in the visualized portions of the skeleton. IMPRESSION: 1. Limited study demonstrating findings suggestive of mild interstitial lung disease, with a spectrum of findings categorized as probable usual interstitial pneumonia (UIP) per current ats guidelines. Repeat high-resolution chest CT is recommended in 12 months to assess for temporal changes in the appearance of the lung parenchyma. 2. Aortic atherosclerosis, in addition to left main and 2 vessel coronary artery disease. Assessment for potential risk factor modification, dietary therapy or pharmacologic therapy may be warranted, if clinically indicated. 3. Small hiatal hernia. 4. Small left adrenal adenoma again noted. Aortic Atherosclerosis (ICD10-I70.0). Electronically Signed   By: Vinnie Langton M.D.   On: 07/04/2020 10:04   ECHOCARDIOGRAM COMPLETE  Result Date: 07/03/2020    ECHOCARDIOGRAM REPORT   Patient Name:   Alison Ayala Date of Exam: 07/03/2020 Medical Rec #:  [id number]         Height:       64.0 in Accession #:     [id number]        Weight:       212.7 lb Date of Birth:  1945/09/09         BSA:          2.008 m Patient Age:    43 years          BP:           127/64 mmHg Patient Gender: F                 HR:           60 bpm. Exam Location:  Inpatient Procedure: 2D Echo, Cardiac Doppler and Color Doppler Indications:    Dyspnea  History:        Patient has prior history of Echocardiogram examinations, most                 recent 07/25/2017. CAD, Mitral Valve Disease and Aortic Valve                 Disease; Risk Factors:Hypertension and Dyslipidemia. S/P DES to  LAD 04/2020. GERD. DOE.  Sonographer:    Clayton Lefort RDCS (AE) Referring Phys: [place]  1. Left ventricular ejection fraction, by estimation, is 60 to 65%. The left ventricle has normal function. The left ventricle has no regional wall motion abnormalities. There is moderate left ventricular hypertrophy of the basal-septal segment. Left ventricular diastolic parameters are consistent with Grade I diastolic dysfunction (impaired relaxation).  2. Right ventricular systolic function is normal. The right ventricular size is normal. There is mildly elevated pulmonary artery systolic pressure. The estimated right ventricular systolic pressure is 62.3 mmHg.  3. The mitral valve is normal in structure. Mild mitral valve regurgitation. No evidence of mitral stenosis.  4. The aortic valve is normal in structure. There is mild calcification of the aortic valve. There is mild thickening of the aortic valve. Aortic valve regurgitation is mild to moderate. Mild to moderate aortic valve sclerosis/calcification is present, without any evidence of aortic stenosis. Aortic valve mean gradient measures 6.0 mmHg. Aortic valve Vmax measures 1.65 m/s.  5. The inferior vena cava is normal in size with greater than 50% respiratory variability, suggesting right atrial pressure of 3 mmHg. FINDINGS  Left Ventricle: Left ventricular ejection fraction, by  estimation, is 60 to 65%. The left ventricle has normal function. The left ventricle has no regional wall motion abnormalities. The left ventricular internal cavity size was normal in size. There is  moderate left ventricular hypertrophy of the basal-septal segment. Left ventricular diastolic parameters are consistent with Grade I diastolic dysfunction (impaired relaxation). Right Ventricle: The right ventricular size is normal. No increase in right ventricular wall thickness. Right ventricular systolic function is normal. There is mildly elevated pulmonary artery systolic pressure. The tricuspid regurgitant velocity is 2.97  m/s, and with an assumed right atrial pressure of 3 mmHg, the estimated right ventricular systolic pressure is 76.2 mmHg. Left Atrium: Left atrial size was normal in size. Right Atrium: Right atrial size was normal in size. Pericardium: There is no evidence of pericardial effusion. Mitral Valve: The mitral valve is normal in structure. Mild mitral valve regurgitation. No evidence of mitral valve stenosis. MV peak gradient, 5.1 mmHg. The mean mitral valve gradient is 2.0 mmHg. Tricuspid Valve: The tricuspid valve is normal in structure. Tricuspid valve regurgitation is trivial. No evidence of tricuspid stenosis. Aortic Valve: The aortic valve is normal in structure. There is mild calcification of the aortic valve. There is mild thickening of the aortic valve. Aortic valve regurgitation is mild to moderate. Aortic regurgitation PHT measures 606 msec. Mild to moderate aortic valve sclerosis/calcification is present, without any evidence of aortic stenosis. Aortic valve mean gradient measures 6.0 mmHg. Aortic valve peak gradient measures 10.9 mmHg. Aortic valve area, by VTI measures 2.97 cm. Pulmonic Valve: The pulmonic valve was normal in structure. Pulmonic valve regurgitation is not visualized. No evidence of pulmonic stenosis. Aorta: The aortic root is normal in size and structure. Venous: The  inferior vena cava is normal in size with greater than 50% respiratory variability, suggesting right atrial pressure of 3 mmHg. IAS/Shunts: No atrial level shunt detected by color flow Doppler.  LEFT VENTRICLE PLAX 2D LVIDd:         4.30 cm  Diastology LVIDs:         2.40 cm  LV e' medial:    7.07 cm/s LV PW:         1.00 cm  LV E/e' medial:  11.3 LV IVS:  1.60 cm  LV e' lateral:   7.62 cm/s LVOT diam:     2.00 cm  LV E/e' lateral: 10.5 LV SV:         101 LV SV Index:   51 LVOT Area:     3.14 cm  RIGHT VENTRICLE             IVC RV Basal diam:  3.30 cm     IVC diam: 1.20 cm RV S prime:     12.10 cm/s TAPSE (M-mode): 2.0 cm LEFT ATRIUM             Index       RIGHT ATRIUM           Index LA diam:        3.30 cm 1.64 cm/m  RA Area:     18.20 cm LA Vol (A2C):   65.8 ml 32.76 ml/m RA Volume:   54.00 ml  26.89 ml/m LA Vol (A4C):   46.9 ml 23.35 ml/m LA Biplane Vol: 60.7 ml 30.22 ml/m  AORTIC VALVE AV Area (Vmax):    2.80 cm AV Area (Vmean):   2.97 cm AV Area (VTI):     2.97 cm AV Vmax:           165.00 cm/s AV Vmean:          111.000 cm/s AV VTI:            0.342 m AV Peak Grad:      10.9 mmHg AV Mean Grad:      6.0 mmHg LVOT Vmax:         147.00 cm/s LVOT Vmean:        105.000 cm/s LVOT VTI:          0.323 m LVOT/AV VTI ratio: 0.94 AI PHT:            606 msec  AORTA Ao Root diam: 3.00 cm Ao Asc diam:  3.10 cm MITRAL VALVE               TRICUSPID VALVE MV Area (PHT): 3.03 cm    TR Peak grad:   35.3 mmHg MV Area VTI:   2.71 cm    TR Vmax:        297.00 cm/s MV Peak grad:  5.1 mmHg MV Mean grad:  2.0 mmHg    SHUNTS MV Vmax:       1.13 m/s    Systemic VTI:  0.32 m MV Vmean:      63.4 cm/s   Systemic Diam: 2.00 cm MV Decel Time: 250 msec MV E velocity: 79.70 cm/s MV A velocity: 97.30 cm/s MV E/A ratio:  0.82 Candee Furbish MD Electronically signed by Candee Furbish MD Signature Date/Time: 07/03/2020/6:44:26 PM    Final      Scheduled Meds: . aspirin EC  81 mg Oral Daily  . diltiazem  60 mg Oral Q6H  .  enoxaparin (LOVENOX) injection  40 mg Subcutaneous QHS  . FLUoxetine  40 mg Oral Daily  . [START ON 07/05/2020] furosemide  40 mg Oral Daily  . irbesartan  300 mg Oral Daily  . metoprolol succinate  50 mg Oral Daily  . pantoprazole  40 mg Oral BID  . rosuvastatin  20 mg Oral Daily  . ticagrelor  90 mg Oral BID  . vitamin B-12  100 mcg Oral Daily   Continuous Infusions:   LOS: 1 day   Time spent: 17  Nita Sells, MD [facility] To contact the attending provider between 7A-7P or the covering provider during after hours 7P-7A, please log into the web site [email or web address] and access using universal [place] password for that web site. If you do not have the password, please call the hospital operator.  07/04/2020, 1:30 PM

## 2020-07-04 NOTE — Progress Notes (Signed)
SATURATION QUALIFICATIONS: (This note is used to comply with regulatory documentation for home oxygen)  Patient Saturations on Room Air at Rest = 95%  Patient Saturations on Room Air while Ambulating = 88%  Patient Saturations on 2 Liters of oxygen while Ambulating = 90*%  Please briefly explain why patient needs home oxygen: pt at room air while sitting in the chair was 95%oxygen sat. pts oxygen sat dropped to 88% and required 2L of oxygen to bring oxygen sat to 90%. Pt ambulated to right outside of her room when stated needing to sit down to catch her breath.  Pt currently on 2L of oxygen and sitting in her chair with call bell within reach.

## 2020-07-05 DIAGNOSIS — I5031 Acute diastolic (congestive) heart failure: Secondary | ICD-10-CM | POA: Diagnosis not present

## 2020-07-05 LAB — COMPREHENSIVE METABOLIC PANEL
ALT: 21 U/L (ref 0–44)
AST: 19 U/L (ref 15–41)
Albumin: 2.8 g/dL — ABNORMAL LOW (ref 3.5–5.0)
Alkaline Phosphatase: 114 U/L (ref 38–126)
Anion gap: 7 (ref 5–15)
BUN: 30 mg/dL — ABNORMAL HIGH (ref 8–23)
CO2: 26 mmol/L (ref 22–32)
Calcium: 9.6 mg/dL (ref 8.9–10.3)
Chloride: 101 mmol/L (ref 98–111)
Creatinine, Ser: 1.36 mg/dL — ABNORMAL HIGH (ref 0.44–1.00)
GFR, Estimated: 41 mL/min — ABNORMAL LOW (ref >60)
Glucose, Bld: 123 mg/dL — ABNORMAL HIGH (ref 70–99)
Potassium: 3.3 mmol/L — ABNORMAL LOW (ref 3.5–5.1)
Sodium: 134 mmol/L — ABNORMAL LOW (ref 135–145)
Total Bilirubin: 0.6 mg/dL (ref 0.3–1.2)
Total Protein: 5.8 g/dL — ABNORMAL LOW (ref 6.5–8.1)

## 2020-07-05 LAB — CBC WITH DIFFERENTIAL/PLATELET
Abs Immature Granulocytes: 0.32 10*3/uL — ABNORMAL HIGH (ref 0.00–0.07)
Basophils Absolute: 0.1 10*3/uL (ref 0.0–0.1)
Basophils Relative: 1 %
Eosinophils Absolute: 0.2 10*3/uL (ref 0.0–0.5)
Eosinophils Relative: 2 %
HCT: 32.3 % — ABNORMAL LOW (ref 36.0–46.0)
Hemoglobin: 10.9 g/dL — ABNORMAL LOW (ref 12.0–15.0)
Immature Granulocytes: 5 %
Lymphocytes Relative: 14 %
Lymphs Abs: 0.9 10*3/uL (ref 0.7–4.0)
MCH: 32 pg (ref 26.0–34.0)
MCHC: 33.7 g/dL (ref 30.0–36.0)
MCV: 94.7 fL (ref 80.0–100.0)
Monocytes Absolute: 1 10*3/uL (ref 0.1–1.0)
Monocytes Relative: 15 %
Neutro Abs: 4 10*3/uL (ref 1.7–7.7)
Neutrophils Relative %: 63 %
Platelets: 325 10*3/uL (ref 150–400)
RBC: 3.41 MIL/uL — ABNORMAL LOW (ref 3.87–5.11)
RDW: 14.2 % (ref 11.5–15.5)
WBC: 6.3 10*3/uL (ref 4.0–10.5)
nRBC: 0 % (ref 0.0–0.2)

## 2020-07-05 MED ORDER — POTASSIUM CHLORIDE CRYS ER 20 MEQ PO TBCR
20.0000 meq | EXTENDED_RELEASE_TABLET | Freq: Once | ORAL | Status: AC
  Administered 2020-07-05: 20 meq via ORAL
  Filled 2020-07-05: qty 1

## 2020-07-05 MED ORDER — POTASSIUM CHLORIDE CRYS ER 20 MEQ PO TBCR
20.0000 meq | EXTENDED_RELEASE_TABLET | Freq: Every day | ORAL | Status: DC
  Administered 2020-07-05: 20 meq via ORAL
  Filled 2020-07-05: qty 1

## 2020-07-05 MED ORDER — POTASSIUM CHLORIDE CRYS ER 20 MEQ PO TBCR: 40.0000 meq | EXTENDED_RELEASE_TABLET | Freq: Every day | ORAL | 0 refills | Status: DC

## 2020-07-05 MED ORDER — FUROSEMIDE 40 MG PO TABS: 40.0000 mg | ORAL_TABLET | Freq: Every day | ORAL | 1 refills | Status: DC

## 2020-07-05 NOTE — Progress Notes (Signed)
D/C instructions given and reviewed. Tele and IV removed, tolerated well. Awaiting O2 delivery.

## 2020-07-05 NOTE — Progress Notes (Signed)
SATURATION QUALIFICATIONS: (This note is used to comply with regulatory documentation for home oxygen) ? ?Patient Saturations on Room Air at Rest = 90% ? ?Patient Saturations on Room Air while Ambulating = 86% ? ?Patient Saturations on 2 Liters of oxygen while Ambulating = 93% ? ?Please briefly explain why patient needs home oxygen: ?

## 2020-07-05 NOTE — Discharge Summary (Signed)
Physician Discharge Summary  Alison Ayala RJJ:[id number] DOB: [birth year] DOA: 07/02/2020  PCP: Harlan Stains, MD  Admit date: 07/02/2020 Discharge date: 07/05/2020  Time spent: 37 minutes  Recommendations for Outpatient Follow-up:  1. Note medication changes discontinuation of HCTZ this admission and initiation of Lasix and oxygen 2. Will carbon copy patient's pulmonologist Dr. Chase Caller asymptomatic close outpatient follow-up can be performed with regards to UIP and pulmonary fibrosis 3. Outpatient discontinuation of Brilinta as per cardiology-has outpatient follow-up with Dr. Acie Fredrickson who saw the patient during hospitalization  Discharge Diagnoses:  MAIN problem for hospitalization   Acute hypoxic respiratory failure?  Pulmonary fibrosis with component of heart failure?-Etiology undetermined at time of discharge  Please see below for itemized issues addressed in [place]- refer to other progress notes for clarity if needed  Discharge Condition: Improved  Diet recommendation: Low-salt heart healthy  Filed Weights   07/03/20 0812 07/04/20 0558 07/05/20 0629  Weight: 96.5 kg 97 kg 97 kg    History of present illness:  42 white female CAD-outpatient PTCA-DES status post Synergy XD 4.0X 16 Dr. 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 Darcey Nora Reflux Mitral valve prolapse HTN VP shunt 2/2 [facility] '14  Seen at  gastroenterology office 06/29/2020 for reflux hernia jackhammer esophagus colonic polyps and reported excessive flatulence 2-3 bowel movements a day--entertained IBS given as needed's  Evaluated 07/02/2020 [facility] ED SOB X 3 weeks hoarse voice chest tightness-O2 sat 85 to 88%  CT chest - pulmonary embolism pulmonary edema noted Rx Lasix  Cardiology notified patient admitted  Hospital Course:  Hypoxia query cause DDx heart failure versus lung disease Current meds: Lasix p.o. 40 daily--although BNP low, Echo normal has  empirically improved on this? Ordered high-res CT chest--confirms interstitial fibrosis-would hold steroids for now-May need advanced therapies?  Immunomodulators versus discussion of the same CC Dr. Chase Caller pulmonary for close follow-up  We have replaced her mildly low potassium on discharge she will need labs in a week CAD with stent 04/25/2020 Mitral valve prolapse followed by Dr. Acie Fredrickson Brilinta/aspirin and other decisions as per cardiology in the outpatient setting around 08/05/2020 which is when this transition significant Current meds: Avapro 300 Toprol-XL 50 Cardizem 60 every 6, aspirin 81, Brilinta 90 twice daily HCTZ DC 5/29 [place]man disease status post thymectomy 2019 Outpatient surveillance as per CVTS in outpatient setting Prior subarachnoid with VP shunt Anticoagulation as per cardiology No focal deficits  Procedures: High-resolution chest CT 5/29 Consultations:  Cardiology  Discharge Exam: Vitals:   07/05/20 0745 07/05/20 0941  BP: 119/64 (!) 114/56  Pulse: 67 71  Resp: 17 16  Temp: 98.5 F (36.9 C) 98.9 F (37.2 C)  SpO2: 96% 98%    Subj on day of d/c   Awake coherent pleasant no distress EOMI NCAT thick neck Mallampati 4 No rales no rhonchi Mild lower extremity edema Neurologically intact no focal deficit  Discharge Instructions   Discharge Instructions    Diet - low sodium heart healthy   Complete by: As directed    Diet - low sodium heart healthy   Complete by: As directed    Discharge instructions   Complete by: As directed    Please make sure that you look at your medication list carefully as medications have changed Follow-up with labs in about 1 week at your primary care physician office as they will need to adjust some of your meds Your cardiologist will be adjusting your Brilinta eventually-please make sure you follow-up with Dr. Acie Fredrickson regarding  the same You have been started on oxygen and we will carbon copy Dr. Chase Caller who will be in  touch with you with regards to setting up an outpatient follow-up for what looks like lung disease from interstitial fibrosis   Increase activity slowly   Complete by: As directed    Increase activity slowly   Complete by: As directed      Allergies as of 07/05/2020      Reactions   Carvedilol Palpitations, Other (See Comments)   Abdominal bloating   Claritin [loratadine] Other (See Comments)   Blurred vision and dry eyes   Lipitor [atorvastatin]    fatigue   Lisinopril Other (See Comments)   "drunk feeling"      Medication List    STOP taking these medications   dimenhyDRINATE 50 MG tablet Commonly known as: DRAMAMINE   hydrochlorothiazide 50 MG tablet Commonly known as: HYDRODIURIL   ondansetron 4 MG disintegrating tablet Commonly known as: ZOFRAN-ODT     TAKE these medications   acetaminophen 500 MG tablet Commonly known as: TYLENOL Take 1-2 tablets (500-1,000 mg total) by mouth 2 (two) times daily as needed for moderate pain or headache.   aspirin EC 81 MG tablet Take 81 mg by mouth daily. Swallow whole.   carboxymethylcellulose 0.5 % Soln Commonly known as: REFRESH PLUS Place 1 drop into both eyes 2 (two) times daily as needed (dry eyes).   diltiazem 60 MG tablet Commonly known as: CARDIZEM TAKE 1 TABLET (60 MG TOTAL) BY MOUTH 4 (FOUR) TIMES DAILY.   esomeprazole 40 MG capsule Commonly known as: NEXIUM TAKE 1 CAPSULE (40 MG TOTAL) BY MOUTH 2 (TWO) TIMES DAILY BEFORE A MEAL.   FLUoxetine 40 MG capsule Commonly known as: PROZAC Take 40 mg by mouth daily.   furosemide 40 MG tablet Commonly known as: LASIX Take 1 tablet (40 mg total) by mouth daily.   ibuprofen 200 MG tablet Commonly known as: ADVIL Take 400-600 mg by mouth 2 (two) times daily as needed for headache or moderate pain.   metoprolol succinate 50 MG 24 hr tablet Commonly known as: TOPROL-XL Take 50 mg by mouth daily. Take with or immediately following a meal.   nitroGLYCERIN 0.4 MG SL  tablet Commonly known as: NITROSTAT DISSOLVE 1 TABLET UNDER THE TONGUE ONCE FOR EPIGASTRIC PAIN/CHEST PAIN NOT RELIEVED BY PEPPERMINT ALTOIDS. IF THIS DOES NOT RESOLVE PAIN, CALL DR! What changed:   how much to take  how to take this  when to take this  reasons to take this  additional instructions   potassium chloride SA 20 MEQ tablet Commonly known as: KLOR-CON Take 2 tablets (40 mEq total) by mouth daily.   rosuvastatin 20 MG tablet Commonly known as: CRESTOR Take 1 tablet (20 mg total) by mouth daily.   ticagrelor 90 MG Tabs tablet Commonly known as: Brilinta Take 1 tablet (90 mg total) by mouth 2 (two) times daily. What changed: Another medication with the same name was removed. Continue taking this medication, and follow the directions you see here.   valsartan 320 MG tablet Commonly known as: DIOVAN Take 1 tablet (320 mg total) by mouth daily.   vitamin B-12 100 MCG tablet Commonly known as: CYANOCOBALAMIN Take 100 mcg by mouth daily.   Vitamin D3 50 MCG (2000 UT) capsule Take 2,000 Units by mouth daily.            Durable Medical Equipment  (From admission, onward)         Start  Ordered   07/05/20 0946  DME Oxygen  Once       Question Answer Comment  Length of Need 12 Months   Mode or (Route) Nasal cannula   Liters per Minute 2   Frequency Continuous (stationary and portable oxygen unit needed)   Oxygen conserving device No   Oxygen delivery system Gas      07/05/20 0947         Allergies  Allergen Reactions  . Carvedilol Palpitations and Other (See Comments)    Abdominal bloating  . Claritin [Loratadine] Other (See Comments)    Blurred vision and dry eyes  . Lipitor [Atorvastatin]     fatigue  . Lisinopril Other (See Comments)    "drunk feeling"    Follow-up Information    Harlan Stains, MD.   Specialty: Family Medicine Contact information: [address] [place] [address] [phone]                 The results of significant diagnostics from this hospitalization (including imaging, microbiology, ancillary and laboratory) are listed below for reference.    Significant Diagnostic Studies: DG Chest 2 View  Result Date: 07/02/2020 CLINICAL DATA:  Shortness of breath.  Symptoms for 3 weeks. EXAM: CHEST - 2 VIEW COMPARISON:  09/12/2017 FINDINGS: Post median sternotomy. Normal heart size and mediastinal contours. Coronary stent. Minor left basilar scarring. No acute airspace disease, pleural effusion, or pneumothorax. No pulmonary edema. No acute osseous abnormalities are seen. IMPRESSION: No acute chest findings. Minor left basilar scarring. Electronically Signed   By: Keith Rake M.D.   On: 07/02/2020 16:14   CT Angio Chest PE W and/or Wo Contrast  Result Date: 07/02/2020 CLINICAL DATA:  Shortness of breath for several weeks EXAM: CT ANGIOGRAPHY CHEST WITH CONTRAST TECHNIQUE: Multidetector CT imaging of the chest was performed using the standard protocol during bolus administration of intravenous contrast. Multiplanar CT image reconstructions and MIPs were obtained to evaluate the vascular anatomy. CONTRAST:  85mL OMNIPAQUE IOHEXOL 350 MG/ML SOLN COMPARISON:  Chest x-ray from earlier in the same day. FINDINGS: Cardiovascular: Thoracic aorta and its branches demonstrate atherosclerotic calcifications without aneurysmal dilatation or dissection. No cardiac enlargement is noted. Coronary calcifications are noted. Pulmonary artery is well visualized with a normal branching pattern. No intraluminal filling defect is identified. Mediastinum/Nodes: Thoracic inlet is within normal limits. No hilar or mediastinal adenopathy is noted. The esophagus as visualized is within normal limits with the exception of a small sliding-type hiatal hernia. Lungs/Pleura: Lungs are well aerated bilaterally. No focal confluent infiltrate is seen. Some generalized increased hazy density is noted throughout both lungs  consistent with mild parenchymal edema. No sizable effusion is seen. Mild emphysematous changes are noted. Upper Abdomen: 19 mm left adrenal lesion is noted and stable from a prior CT from July 2020 consistent with an adenoma. The remainder of the upper abdomen is within normal limits. Musculoskeletal: Degenerative changes of the thoracic spine are noted. No acute bony abnormality is seen. Old rib fractures are noted on the left. Review of the MIP images confirms the above findings. IMPRESSION: No evidence of pulmonary emboli. Changes consistent with mild pulmonary edema and vascular congestion. Small sliding-type hiatal hernia. Stable left adrenal lesion consistent with adenoma. Aortic Atherosclerosis (ICD10-I70.0) and Emphysema (ICD10-J43.9). Electronically Signed   By: Inez Catalina M.D.   On: 07/02/2020 20:31   CT Chest High Resolution  Result Date: 07/04/2020 CLINICAL DATA:  75 year old female with history of chest pain and shortness of  breath. Evaluate for interstitial lung disease. EXAM: CT CHEST WITHOUT CONTRAST TECHNIQUE: Multidetector CT imaging of the chest was performed following the standard protocol without intravenous contrast. High resolution imaging of the lungs, as well as inspiratory and expiratory imaging, was performed. COMPARISON:  Chest CTA 07/02/2020. FINDINGS: Comment: Today's study is limited by considerable patient respiratory motion. Cardiovascular: Heart size is normal. There is no significant pericardial fluid, thickening or pericardial calcification. There is aortic atherosclerosis, as well as atherosclerosis of the great vessels of the mediastinum and the coronary arteries, including calcified atherosclerotic plaque in the left main, left anterior descending and right coronary arteries. Calcifications of the aortic valve. Mediastinum/Nodes: No pathologically enlarged mediastinal or hilar lymph nodes. Please note that accurate exclusion of hilar adenopathy is limited on noncontrast  CT scans. Small hiatal hernia. No axillary lymphadenopathy. Lungs/Pleura: On today's motion limited examination there does appear to be widespread but patchy areas of ground-glass attenuation. On inspiratory images which are less affected by motion, there is some very mild septal thickening and scattered areas of mild cylindrical bronchiectasis. Findings do appear to have a mild craniocaudal gradient. Inspiratory and expiratory imaging is otherwise unremarkable. No acute consolidative airspace disease. No pleural effusions. Upper Abdomen: Aortic atherosclerosis. 2.1 x 1.7 cm low-attenuation (8 Hounsfield unit) left adrenal nodule, similar to prior studies, compatible with a benign adenoma. Aortic atherosclerosis. Musculoskeletal: Median sternotomy wires. There are no aggressive appearing lytic or blastic lesions noted in the visualized portions of the skeleton. IMPRESSION: 1. Limited study demonstrating findings suggestive of mild interstitial lung disease, with a spectrum of findings categorized as probable usual interstitial pneumonia (UIP) per current ats guidelines. Repeat high-resolution chest CT is recommended in 12 months to assess for temporal changes in the appearance of the lung parenchyma. 2. Aortic atherosclerosis, in addition to left main and 2 vessel coronary artery disease. Assessment for potential risk factor modification, dietary therapy or pharmacologic therapy may be warranted, if clinically indicated. 3. Small hiatal hernia. 4. Small left adrenal adenoma again noted. Aortic Atherosclerosis (ICD10-I70.0). Electronically Signed   By: Vinnie Langton M.D.   On: 07/04/2020 10:04   ECHOCARDIOGRAM COMPLETE  Result Date: 07/03/2020    ECHOCARDIOGRAM REPORT   Patient Name:   Alison Ayala Date of Exam: 07/03/2020 Medical Rec #:  [id number]         Height:       64.0 in Accession #:    [id number]        Weight:       212.7 lb Date of Birth:  March 26, 1945         BSA:          2.008 m Patient Age:     75 years          BP:           127/64 mmHg Patient Gender: F                 HR:           60 bpm. Exam Location:  Inpatient Procedure: 2D Echo, Cardiac Doppler and Color Doppler Indications:    Dyspnea  History:        Patient has prior history of Echocardiogram examinations, most                 recent 07/25/2017. CAD, Mitral Valve Disease and Aortic Valve                 Disease; Risk Factors:Hypertension and Dyslipidemia. S/P  DES to                 LAD 04/2020. GERD. DOE.  Sonographer:    Clayton Lefort RDCS (AE) Referring Phys: [place]  1. Left ventricular ejection fraction, by estimation, is 60 to 65%. The left ventricle has normal function. The left ventricle has no regional wall motion abnormalities. There is moderate left ventricular hypertrophy of the basal-septal segment. Left ventricular diastolic parameters are consistent with Grade I diastolic dysfunction (impaired relaxation).  2. Right ventricular systolic function is normal. The right ventricular size is normal. There is mildly elevated pulmonary artery systolic pressure. The estimated right ventricular systolic pressure is 83.4 mmHg.  3. The mitral valve is normal in structure. Mild mitral valve regurgitation. No evidence of mitral stenosis.  4. The aortic valve is normal in structure. There is mild calcification of the aortic valve. There is mild thickening of the aortic valve. Aortic valve regurgitation is mild to moderate. Mild to moderate aortic valve sclerosis/calcification is present, without any evidence of aortic stenosis. Aortic valve mean gradient measures 6.0 mmHg. Aortic valve Vmax measures 1.65 m/s.  5. The inferior vena cava is normal in size with greater than 50% respiratory variability, suggesting right atrial pressure of 3 mmHg. FINDINGS  Left Ventricle: Left ventricular ejection fraction, by estimation, is 60 to 65%. The left ventricle has normal function. The left ventricle has no regional wall motion  abnormalities. The left ventricular internal cavity size was normal in size. There is  moderate left ventricular hypertrophy of the basal-septal segment. Left ventricular diastolic parameters are consistent with Grade I diastolic dysfunction (impaired relaxation). Right Ventricle: The right ventricular size is normal. No increase in right ventricular wall thickness. Right ventricular systolic function is normal. There is mildly elevated pulmonary artery systolic pressure. The tricuspid regurgitant velocity is 2.97  m/s, and with an assumed right atrial pressure of 3 mmHg, the estimated right ventricular systolic pressure is 19.6 mmHg. Left Atrium: Left atrial size was normal in size. Right Atrium: Right atrial size was normal in size. Pericardium: There is no evidence of pericardial effusion. Mitral Valve: The mitral valve is normal in structure. Mild mitral valve regurgitation. No evidence of mitral valve stenosis. MV peak gradient, 5.1 mmHg. The mean mitral valve gradient is 2.0 mmHg. Tricuspid Valve: The tricuspid valve is normal in structure. Tricuspid valve regurgitation is trivial. No evidence of tricuspid stenosis. Aortic Valve: The aortic valve is normal in structure. There is mild calcification of the aortic valve. There is mild thickening of the aortic valve. Aortic valve regurgitation is mild to moderate. Aortic regurgitation PHT measures 606 msec. Mild to moderate aortic valve sclerosis/calcification is present, without any evidence of aortic stenosis. Aortic valve mean gradient measures 6.0 mmHg. Aortic valve peak gradient measures 10.9 mmHg. Aortic valve area, by VTI measures 2.97 cm. Pulmonic Valve: The pulmonic valve was normal in structure. Pulmonic valve regurgitation is not visualized. No evidence of pulmonic stenosis. Aorta: The aortic root is normal in size and structure. Venous: The inferior vena cava is normal in size with greater than 50% respiratory variability, suggesting right atrial  pressure of 3 mmHg. IAS/Shunts: No atrial level shunt detected by color flow Doppler.  LEFT VENTRICLE PLAX 2D LVIDd:         4.30 cm  Diastology LVIDs:         2.40 cm  LV e' medial:    7.07 cm/s LV PW:  1.00 cm  LV E/e' medial:  11.3 LV IVS:        1.60 cm  LV e' lateral:   7.62 cm/s LVOT diam:     2.00 cm  LV E/e' lateral: 10.5 LV SV:         101 LV SV Index:   51 LVOT Area:     3.14 cm  RIGHT VENTRICLE             IVC RV Basal diam:  3.30 cm     IVC diam: 1.20 cm RV S prime:     12.10 cm/s TAPSE (M-mode): 2.0 cm LEFT ATRIUM             Index       RIGHT ATRIUM           Index LA diam:        3.30 cm 1.64 cm/m  RA Area:     18.20 cm LA Vol (A2C):   65.8 ml 32.76 ml/m RA Volume:   54.00 ml  26.89 ml/m LA Vol (A4C):   46.9 ml 23.35 ml/m LA Biplane Vol: 60.7 ml 30.22 ml/m  AORTIC VALVE AV Area (Vmax):    2.80 cm AV Area (Vmean):   2.97 cm AV Area (VTI):     2.97 cm AV Vmax:           165.00 cm/s AV Vmean:          111.000 cm/s AV VTI:            0.342 m AV Peak Grad:      10.9 mmHg AV Mean Grad:      6.0 mmHg LVOT Vmax:         147.00 cm/s LVOT Vmean:        105.000 cm/s LVOT VTI:          0.323 m LVOT/AV VTI ratio: 0.94 AI PHT:            606 msec  AORTA Ao Root diam: 3.00 cm Ao Asc diam:  3.10 cm MITRAL VALVE               TRICUSPID VALVE MV Area (PHT): 3.03 cm    TR Peak grad:   35.3 mmHg MV Area VTI:   2.71 cm    TR Vmax:        297.00 cm/s MV Peak grad:  5.1 mmHg MV Mean grad:  2.0 mmHg    SHUNTS MV Vmax:       1.13 m/s    Systemic VTI:  0.32 m MV Vmean:      63.4 cm/s   Systemic Diam: 2.00 cm MV Decel Time: 250 msec MV E velocity: 79.70 cm/s MV A velocity: 97.30 cm/s MV E/A ratio:  0.82 Candee Furbish MD Electronically signed by Candee Furbish MD Signature Date/Time: 07/03/2020/6:44:26 PM    Final     Microbiology: Recent Results (from the past 240 hour(s))  SARS CORONAVIRUS 2 (TAT 6-24 HRS) Nasopharyngeal Nasopharyngeal Swab     Status: None   Collection Time: 07/02/20  5:49 PM   Specimen:  Nasopharyngeal Swab  Result Value Ref Range Status   SARS Coronavirus 2 NEGATIVE NEGATIVE Final    Comment: (NOTE) SARS-CoV-2 target nucleic acids are NOT DETECTED.  The SARS-CoV-2 RNA is generally detectable in upper and lower respiratory specimens during the acute phase of infection. Negative results do not preclude SARS-CoV-2 infection, do not rule out co-infections with other pathogens, and should not  be used as the sole basis for treatment or other patient management decisions. Negative results must be combined with clinical observations, patient history, and epidemiological information. The expected result is Negative.  Fact Sheet for Patients: SugarRoll.be  Fact Sheet for Healthcare Providers: https://www.woods-mathews.com/  This test is not yet approved or cleared by the [place] FDA and  has been authorized for detection and/or diagnosis of SARS-CoV-2 by FDA under an Emergency Use Authorization (EUA). This EUA will remain  in effect (meaning this test can be used) for the duration of the COVID-19 declaration under Se ction 564(b)(1) of the Act, 21 U.S.C. section 360bbb-3(b)(1), unless the authorization is terminated or revoked sooner.  Performed at [facility], [place] [address], [place], [place] [address]      Labs: Basic Metabolic Panel: Recent Labs  Lab 07/02/20 1540 07/03/20 0538 07/04/20 0618 07/05/20 0239  NA 134* 137 138 134*  K 3.8 3.6 4.5 3.3*  CL 102 103 102 101  CO2 22 25 27 26   GLUCOSE 143* 126* 114* 123*  BUN 30* 23 24* 30*  CREATININE 1.06* 0.91 1.11* 1.36*  CALCIUM 9.5 9.5 9.8 9.6  MG  --  1.8  --   --    Liver Function Tests: Recent Labs  Lab 07/05/20 0239  AST 19  ALT 21  ALKPHOS 114  BILITOT 0.6  PROT 5.8*  ALBUMIN 2.8*   No results for input(s): LIPASE, AMYLASE in the last 168 hours. No results for input(s): AMMONIA in the last 168 hours. CBC: Recent Labs  Lab 07/02/20 1540  07/03/20 0538 07/04/20 0618 07/05/20 0239  WBC 7.1 7.2 8.6 6.3  NEUTROABS 4.8  --   --  4.0  HGB 13.0 12.4 12.3 10.9*  HCT 38.5 36.4 38.0 32.3*  MCV 95.5 94.8 97.4 94.7  PLT 358 319 379 325   Cardiac Enzymes: No results for input(s): CKTOTAL, CKMB, CKMBINDEX, TROPONINI in the last 168 hours. BNP: BNP (last 3 results) Recent Labs    07/02/20 1832  BNP 23.5    ProBNP (last 3 results) No results for input(s): PROBNP in the last 8760 hours.  CBG: No results for input(s): GLUCAP in the last 168 hours.     Signed:  Nita Sells MD   [facility] 07/05/2020, 9:47 AM

## 2020-07-05 NOTE — TOC Transition Note (Signed)
Transition of Care [facility]) - CM/SW Discharge Note   Patient Details  Name: Alison Ayala MRN: [id number] Date of Birth: Nov 02, 1945  Transition of Care [facility]) CM/SW Contact:  Sharin Mons, RN Phone Number: 07/05/2020, 10:23 AM   Clinical Narrative:    Patient will DC to: home Anticipated DC date: 07/05/2020 Family notified: yes, husband Transport by: car  Presented with SOB/CP. From home with husband. PTA independent with ADL's. No DME needs.  Per MD patient ready for DC today. RN, patient, patient's family, and facility notified of DC. Order noted for DME oxygen. Referral made with [facility] for DME oxygen. Portable oxygen will be delivered to bedside prior to d/c .  Pt without Rx meds concerns.  Pt with hospital f/u noted on AVS.  Pt's cousin Letta Median to provide transportation to home.  RNCM will sign off for now as intervention is no longer needed. Please consult [place] again if new needs arise.   Final next level of care: [place] Barriers to Discharge: No Barriers Identified   Patient Goals and CMS Choice     Choice offered to / list presented to : Patient  Discharge Placement                       Discharge Plan and Services                  DME Agency: [facility] Date DME Agency Contacted: 07/05/20 Time DME Agency Contacted: [phone] Representative spoke with at DME Agency: [place] ([place]) Interventions     Readmission Risk Interventions No flowsheet data found.

## 2020-07-05 NOTE — Progress Notes (Signed)
Progress Note  Patient Name: Alison Ayala Date of Encounter: 07/05/2020  [facility] Cardiologist: Mertie Moores, MD   Subjective   75 yo with CAD , Admitted with acute worsening of her dyspnea.  High res CT of the lungs shows mild interstitial lung disease.  She did improve with diuresis.  O2 sats dropped to 88 % with ambulation this am  Plan is for DC later today    Inpatient Medications    Scheduled Meds: . aspirin EC  81 mg Oral Daily  . diltiazem  60 mg Oral Q6H  . enoxaparin (LOVENOX) injection  40 mg Subcutaneous QHS  . FLUoxetine  40 mg Oral Daily  . furosemide  40 mg Oral Daily  . irbesartan  300 mg Oral Daily  . metoprolol succinate  50 mg Oral Daily  . pantoprazole  40 mg Oral BID  . rosuvastatin  20 mg Oral Daily  . ticagrelor  90 mg Oral BID  . vitamin B-12  100 mcg Oral Daily   Continuous Infusions:  PRN Meds: acetaminophen **OR** acetaminophen, nitroGLYCERIN   Vital Signs    Vitals:   07/04/20 1204 07/04/20 2043 07/05/20 0629 07/05/20 0745  BP: (!) 109/51 (!) 111/54 136/64 119/64  Pulse: 61 60 66 67  Resp: 20 18 20 17   Temp: 97.8 F (36.6 C) 98.5 F (36.9 C) 98 F (36.7 C) 98.5 F (36.9 C)  TempSrc: Oral Oral Oral Oral  SpO2: 98% 97% 99% 96%  Weight:   97 kg   Height:        Intake/Output Summary (Last 24 hours) at 07/05/2020 0856 Last data filed at 07/05/2020 0835 Gross per 24 hour  Intake 1040 ml  Output 1950 ml  Net -910 ml   Last 3 Weights 07/05/2020 07/04/2020 07/03/2020  Weight (lbs) 213 lb 13.5 oz 213 lb 14.4 oz 212 lb 11.9 oz  Weight (kg) 97 kg 97.024 kg 96.5 kg      Telemetry    Sinus rhythm  - Personally Reviewed  ECG      - Personally Reviewed  Physical Exam    GEN:  elderly female , NAD    Neck: No JVD Cardiac: RRR,  Soft systolic murmur   Respiratory: Clear to auscultation bilaterally. GI: Soft, nontender, non-distended  MS: No edema; No deformity. Neuro:  Nonfocal  Psych: Normal affect   Labs     High Sensitivity Troponin:   Recent Labs  Lab 07/02/20 1540 07/02/20 1832  TROPONINIHS 10 10      Chemistry Recent Labs  Lab 07/03/20 0538 07/04/20 0618 07/05/20 0239  NA 137 138 134*  K 3.6 4.5 3.3*  CL 103 102 101  CO2 25 27 26   GLUCOSE 126* 114* 123*  BUN 23 24* 30*  CREATININE 0.91 1.11* 1.36*  CALCIUM 9.5 9.8 9.6  PROT  --   --  5.8*  ALBUMIN  --   --  2.8*  AST  --   --  19  ALT  --   --  21  ALKPHOS  --   --  114  BILITOT  --   --  0.6  GFRNONAA >60 52* 41*  ANIONGAP 9 9 7      Hematology Recent Labs  Lab 07/03/20 0538 07/04/20 0618 07/05/20 0239  WBC 7.2 8.6 6.3  RBC 3.84* 3.90 3.41*  HGB 12.4 12.3 10.9*  HCT 36.4 38.0 32.3*  MCV 94.8 97.4 94.7  MCH 32.3 31.5 32.0  MCHC 34.1 32.4 33.7  RDW 14.2 14.4 14.2  PLT 319 379 325    BNP Recent Labs  Lab 07/02/20 1832  BNP 23.5     DDimer  Recent Labs  Lab 07/02/20 1832  DDIMER 0.86*     Radiology    CT Chest High Resolution  Result Date: 07/04/2020 CLINICAL DATA:  75 year old female with history of chest pain and shortness of breath. Evaluate for interstitial lung disease. EXAM: CT CHEST WITHOUT CONTRAST TECHNIQUE: Multidetector CT imaging of the chest was performed following the standard protocol without intravenous contrast. High resolution imaging of the lungs, as well as inspiratory and expiratory imaging, was performed. COMPARISON:  Chest CTA 07/02/2020. FINDINGS: Comment: Today's study is limited by considerable patient respiratory motion. Cardiovascular: Heart size is normal. There is no significant pericardial fluid, thickening or pericardial calcification. There is aortic atherosclerosis, as well as atherosclerosis of the great vessels of the mediastinum and the coronary arteries, including calcified atherosclerotic plaque in the left main, left anterior descending and right coronary arteries. Calcifications of the aortic valve. Mediastinum/Nodes: No pathologically enlarged mediastinal or  hilar lymph nodes. Please note that accurate exclusion of hilar adenopathy is limited on noncontrast CT scans. Small hiatal hernia. No axillary lymphadenopathy. Lungs/Pleura: On today's motion limited examination there does appear to be widespread but patchy areas of ground-glass attenuation. On inspiratory images which are less affected by motion, there is some very mild septal thickening and scattered areas of mild cylindrical bronchiectasis. Findings do appear to have a mild craniocaudal gradient. Inspiratory and expiratory imaging is otherwise unremarkable. No acute consolidative airspace disease. No pleural effusions. Upper Abdomen: Aortic atherosclerosis. 2.1 x 1.7 cm low-attenuation (8 Hounsfield unit) left adrenal nodule, similar to prior studies, compatible with a benign adenoma. Aortic atherosclerosis. Musculoskeletal: Median sternotomy wires. There are no aggressive appearing lytic or blastic lesions noted in the visualized portions of the skeleton. IMPRESSION: 1. Limited study demonstrating findings suggestive of mild interstitial lung disease, with a spectrum of findings categorized as probable usual interstitial pneumonia (UIP) per current ats guidelines. Repeat high-resolution chest CT is recommended in 12 months to assess for temporal changes in the appearance of the lung parenchyma. 2. Aortic atherosclerosis, in addition to left main and 2 vessel coronary artery disease. Assessment for potential risk factor modification, dietary therapy or pharmacologic therapy may be warranted, if clinically indicated. 3. Small hiatal hernia. 4. Small left adrenal adenoma again noted. Aortic Atherosclerosis (ICD10-I70.0). Electronically Signed   By: Vinnie Langton M.D.   On: 07/04/2020 10:04   ECHOCARDIOGRAM COMPLETE  Result Date: 07/03/2020    ECHOCARDIOGRAM REPORT   Patient Name:   Alison Ayala Date of Exam: 07/03/2020 Medical Rec #:  [id number]         Height:       64.0 in Accession #:    [id number]         Weight:       212.7 lb Date of Birth:  [birth year]         BSA:          2.008 m Patient Age:    38 years          BP:           127/64 mmHg Patient Gender: F                 HR:           60 bpm. Exam Location:  Inpatient Procedure: 2D Echo, Cardiac Doppler and Color Doppler Indications:  Dyspnea  History:        Patient has prior history of Echocardiogram examinations, most                 recent 07/25/2017. CAD, Mitral Valve Disease and Aortic Valve                 Disease; Risk Factors:Hypertension and Dyslipidemia. S/P DES to                 LAD 04/2020. GERD. DOE.  Sonographer:    Clayton Lefort RDCS (AE) Referring Phys: [place]  1. Left ventricular ejection fraction, by estimation, is 60 to 65%. The left ventricle has normal function. The left ventricle has no regional wall motion abnormalities. There is moderate left ventricular hypertrophy of the basal-septal segment. Left ventricular diastolic parameters are consistent with Grade I diastolic dysfunction (impaired relaxation).  2. Right ventricular systolic function is normal. The right ventricular size is normal. There is mildly elevated pulmonary artery systolic pressure. The estimated right ventricular systolic pressure is 42.3 mmHg.  3. The mitral valve is normal in structure. Mild mitral valve regurgitation. No evidence of mitral stenosis.  4. The aortic valve is normal in structure. There is mild calcification of the aortic valve. There is mild thickening of the aortic valve. Aortic valve regurgitation is mild to moderate. Mild to moderate aortic valve sclerosis/calcification is present, without any evidence of aortic stenosis. Aortic valve mean gradient measures 6.0 mmHg. Aortic valve Vmax measures 1.65 m/s.  5. The inferior vena cava is normal in size with greater than 50% respiratory variability, suggesting right atrial pressure of 3 mmHg. FINDINGS  Left Ventricle: Left ventricular ejection fraction, by estimation, is 60 to  65%. The left ventricle has normal function. The left ventricle has no regional wall motion abnormalities. The left ventricular internal cavity size was normal in size. There is  moderate left ventricular hypertrophy of the basal-septal segment. Left ventricular diastolic parameters are consistent with Grade I diastolic dysfunction (impaired relaxation). Right Ventricle: The right ventricular size is normal. No increase in right ventricular wall thickness. Right ventricular systolic function is normal. There is mildly elevated pulmonary artery systolic pressure. The tricuspid regurgitant velocity is 2.97  m/s, and with an assumed right atrial pressure of 3 mmHg, the estimated right ventricular systolic pressure is 53.6 mmHg. Left Atrium: Left atrial size was normal in size. Right Atrium: Right atrial size was normal in size. Pericardium: There is no evidence of pericardial effusion. Mitral Valve: The mitral valve is normal in structure. Mild mitral valve regurgitation. No evidence of mitral valve stenosis. MV peak gradient, 5.1 mmHg. The mean mitral valve gradient is 2.0 mmHg. Tricuspid Valve: The tricuspid valve is normal in structure. Tricuspid valve regurgitation is trivial. No evidence of tricuspid stenosis. Aortic Valve: The aortic valve is normal in structure. There is mild calcification of the aortic valve. There is mild thickening of the aortic valve. Aortic valve regurgitation is mild to moderate. Aortic regurgitation PHT measures 606 msec. Mild to moderate aortic valve sclerosis/calcification is present, without any evidence of aortic stenosis. Aortic valve mean gradient measures 6.0 mmHg. Aortic valve peak gradient measures 10.9 mmHg. Aortic valve area, by VTI measures 2.97 cm. Pulmonic Valve: The pulmonic valve was normal in structure. Pulmonic valve regurgitation is not visualized. No evidence of pulmonic stenosis. Aorta: The aortic root is normal in size and structure. Venous: The inferior vena cava  is normal in size with greater than 50% respiratory  variability, suggesting right atrial pressure of 3 mmHg. IAS/Shunts: No atrial level shunt detected by color flow Doppler.  LEFT VENTRICLE PLAX 2D LVIDd:         4.30 cm  Diastology LVIDs:         2.40 cm  LV e' medial:    7.07 cm/s LV PW:         1.00 cm  LV E/e' medial:  11.3 LV IVS:        1.60 cm  LV e' lateral:   7.62 cm/s LVOT diam:     2.00 cm  LV E/e' lateral: 10.5 LV SV:         101 LV SV Index:   51 LVOT Area:     3.14 cm  RIGHT VENTRICLE             IVC RV Basal diam:  3.30 cm     IVC diam: 1.20 cm RV S prime:     12.10 cm/s TAPSE (M-mode): 2.0 cm LEFT ATRIUM             Index       RIGHT ATRIUM           Index LA diam:        3.30 cm 1.64 cm/m  RA Area:     18.20 cm LA Vol (A2C):   65.8 ml 32.76 ml/m RA Volume:   54.00 ml  26.89 ml/m LA Vol (A4C):   46.9 ml 23.35 ml/m LA Biplane Vol: 60.7 ml 30.22 ml/m  AORTIC VALVE AV Area (Vmax):    2.80 cm AV Area (Vmean):   2.97 cm AV Area (VTI):     2.97 cm AV Vmax:           165.00 cm/s AV Vmean:          111.000 cm/s AV VTI:            0.342 m AV Peak Grad:      10.9 mmHg AV Mean Grad:      6.0 mmHg LVOT Vmax:         147.00 cm/s LVOT Vmean:        105.000 cm/s LVOT VTI:          0.323 m LVOT/AV VTI ratio: 0.94 AI PHT:            606 msec  AORTA Ao Root diam: 3.00 cm Ao Asc diam:  3.10 cm MITRAL VALVE               TRICUSPID VALVE MV Area (PHT): 3.03 cm    TR Peak grad:   35.3 mmHg MV Area VTI:   2.71 cm    TR Vmax:        297.00 cm/s MV Peak grad:  5.1 mmHg MV Mean grad:  2.0 mmHg    SHUNTS MV Vmax:       1.13 m/s    Systemic VTI:  0.32 m MV Vmean:      63.4 cm/s   Systemic Diam: 2.00 cm MV Decel Time: 250 msec MV E velocity: 79.70 cm/s MV A velocity: 97.30 cm/s MV E/A ratio:  0.82 Candee Furbish MD Electronically signed by Candee Furbish MD Signature Date/Time: 07/03/2020/6:44:26 PM    Final     Cardiac Studies     Patient Profile     75 y.o. female with dyspnea,  Assessment & Plan     1.    Acute diastolic chf:   BNP  is low Improved with diuresis   2. Interstitial lung disease:  High Resolution lung CT shows mild ILD. Will have her see Pulmonary   3.  MVP:  Stable   4.  Hyperlipidemia:   Continue statin   5.  Hypokalemia:  K is 3.3 Will give her Kdur 40 meq today and then 20 a day She will need a BMP in 1-2 weeks.       For questions or updates, please contact [place] Please consult [email or web address] for contact info under        Signed, Mertie Moores, MD  07/05/2020, 8:56 AM

## 2020-07-05 NOTE — Plan of Care (Signed)
  Problem: Education: Goal: Ability to demonstrate management of disease process will improve Outcome: Adequate for Discharge Goal: Ability to verbalize understanding of medication therapies will improve Outcome: Adequate for Discharge Goal: Individualized Educational Video(s) Outcome: Adequate for Discharge   Problem: Activity: Goal: Capacity to carry out activities will improve Outcome: Adequate for Discharge

## 2020-07-06 DIAGNOSIS — R0600 Dyspnea, unspecified: Secondary | ICD-10-CM | POA: Diagnosis not present

## 2020-07-06 DIAGNOSIS — E785 Hyperlipidemia, unspecified: Secondary | ICD-10-CM | POA: Diagnosis not present

## 2020-07-06 DIAGNOSIS — M810 Age-related osteoporosis without current pathological fracture: Secondary | ICD-10-CM | POA: Diagnosis not present

## 2020-07-06 DIAGNOSIS — I10 Essential (primary) hypertension: Secondary | ICD-10-CM | POA: Diagnosis not present

## 2020-07-06 DIAGNOSIS — K219 Gastro-esophageal reflux disease without esophagitis: Secondary | ICD-10-CM | POA: Diagnosis not present

## 2020-07-06 DIAGNOSIS — I509 Heart failure, unspecified: Secondary | ICD-10-CM | POA: Diagnosis not present

## 2020-07-06 DIAGNOSIS — F3341 Major depressive disorder, recurrent, in partial remission: Secondary | ICD-10-CM | POA: Diagnosis not present

## 2020-07-06 DIAGNOSIS — I2510 Atherosclerotic heart disease of native coronary artery without angina pectoris: Secondary | ICD-10-CM | POA: Diagnosis not present

## 2020-07-08 DIAGNOSIS — J849 Interstitial pulmonary disease, unspecified: Secondary | ICD-10-CM | POA: Diagnosis not present

## 2020-07-08 DIAGNOSIS — E876 Hypokalemia: Secondary | ICD-10-CM | POA: Diagnosis not present

## 2020-07-08 DIAGNOSIS — F3341 Major depressive disorder, recurrent, in partial remission: Secondary | ICD-10-CM | POA: Diagnosis not present

## 2020-07-08 DIAGNOSIS — R1010 Upper abdominal pain, unspecified: Secondary | ICD-10-CM | POA: Diagnosis not present

## 2020-07-08 DIAGNOSIS — I10 Essential (primary) hypertension: Secondary | ICD-10-CM | POA: Diagnosis not present

## 2020-07-08 DIAGNOSIS — J9601 Acute respiratory failure with hypoxia: Secondary | ICD-10-CM | POA: Diagnosis not present

## 2020-07-08 DIAGNOSIS — I2510 Atherosclerotic heart disease of native coronary artery without angina pectoris: Secondary | ICD-10-CM | POA: Diagnosis not present

## 2020-07-08 NOTE — Progress Notes (Signed)
Addendum: Reviewed and agree with assessment and management plan. Haylea Schlichting M, MD  

## 2020-07-13 ENCOUNTER — Other Ambulatory Visit: Payer: Self-pay

## 2020-07-13 ENCOUNTER — Encounter: Payer: Self-pay | Admitting: Primary Care

## 2020-07-13 ENCOUNTER — Ambulatory Visit (INDEPENDENT_AMBULATORY_CARE_PROVIDER_SITE_OTHER): Payer: Medicare HMO | Admitting: Primary Care

## 2020-07-13 VITALS — BP 122/68 | HR 88 | Temp 97.7°F | Ht 64.0 in | Wt 211.0 lb

## 2020-07-13 DIAGNOSIS — J849 Interstitial pulmonary disease, unspecified: Secondary | ICD-10-CM | POA: Diagnosis not present

## 2020-07-13 DIAGNOSIS — J439 Emphysema, unspecified: Secondary | ICD-10-CM | POA: Diagnosis not present

## 2020-07-13 DIAGNOSIS — J8489 Other specified interstitial pulmonary diseases: Secondary | ICD-10-CM

## 2020-07-13 NOTE — Patient Instructions (Addendum)
Nice seeing you today Alison Ayala, no clear evidence of worsening pulmonary fibrosis.  I think acute heart failure caused your oxygen needed. We should repeat pulmonary testing to monitor lung function.  Orders: - Ambulatory walk test on Room air, if able to maintain O2 >88% ok to discontinue oxygen  - PFTs (ordered) *I will call you with results  - HRCT in 1 year to monitor NSIP (mild pulmonary fibrosis)  Follow-up: - 6 months with Dr. Chase Caller

## 2020-07-13 NOTE — Progress Notes (Signed)
@Patient  ID: Alison Ayala, female    DOB: [birth year], 75 y.o.   MRN: [id number]  Chief Complaint  Patient presents with   Follow-up    Wants to stop supplemental oxygen    Referring provider: Harlan Stains, MD  HPI: 75 year old female, never smoked.  Past medical history significant for NSIP, congestive heart failure, coronary artery disease, unstable angina, hypertension, GERD.  Patient of Dr. Chase Caller last seen in office on 10/08/2018 by pulmonary nurse practitioner.  Admitted from 07/02/2020 - 07/05/2020 for acute hypoxic respiratory failure due to pulmonary fibrosis with component of heart failure.  Patient reported shortness of breath x3 weeks with associated voice hoarseness and chest tightness.  O2 saturations 85 to 88%.  5/27 showed no evidence of pulmonary embolism, changes consistent with mild pulmonary edema and vascular congestion. Mild emphysema. HCTZ was discontinued and patient was started on Lasix and oxygen.  She had cardiac cath in March 2022 which showed severe ostial/proximal LAD stenosis, treated with PTCA/DES x 1 with optimization with OCT imaging. She had no obstruction otherwise. She is current on brilanta.   Patient reports that she is breathing better, she coughing up some clear mucus yesterday. Before she was hospitalized she reports voice hoarseness and cough. CT chest results have improved as of July/2020.     Allergies  Allergen Reactions   Carvedilol Palpitations and Other (See Comments)    Abdominal bloating   Claritin [Loratadine] Other (See Comments)    Blurred vision and dry eyes   Lipitor [Atorvastatin]     fatigue   Lisinopril Other (See Comments)    "drunk feeling"    Immunization History  Administered Date(s) Administered   Fluad Quad(high Dose 65+) 10/08/2018   Influenza, High Dose Seasonal PF 10/07/2016, 11/06/2017   Influenza-Unspecified 01/13/2011, 10/03/2012, 10/07/2013, 10/14/2015, 10/24/2016, 11/06/2017, 01/05/2020    PFIZER(Purple Top)SARS-COV-2 Vaccination 02/26/2019, 03/17/2019   Zoster Recombinat (Shingrix) 11/06/2017   Zoster, Live 11/06/2017, 12/25/2018    Past Medical History:  Diagnosis Date   CAD (coronary artery disease)    a. s/p DES to LAD 04/2020.   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[place])    Diverticulosis    GERD (gastroesophageal reflux disease)    H/O hiatal hernia    H/O intraventricular hemorrhage 2004--  SPONTANEOUS   S/P SHUNT   Heart murmur    Hiatal hernia    Hip pain, right OCCASIONAL -- PINCHED NERVE   History of gastric ulcer 2001   Hyperlipidemia    Hypertension    Hypoglycemia    Internal hemorrhoids    Interstitial lung disease ([place])    Jackhammer esophagus    Mediastinal mass    Mitral valve prolapse    Normal echocardiogram 30 YRS AGO   NSIP (nonspecific interstitial pneumonia) ([place])    S/P ventriculoperitoneal shunt 2004--- PER PT SHUNT IS WORKING WELL --  NO ISSUES   SAH (subarachnoid hemorrhage) ([facility])    Squamous cell cancer of scalp and skin of neck    mole back of head   Tubular adenoma of colon     Tobacco History: Social History   Tobacco Use  Smoking Status Never  Smokeless Tobacco Never   Counseling given: Not Answered   Outpatient Medications Prior to Visit  Medication Sig Dispense Refill   acetaminophen (TYLENOL) 500 MG tablet Take 1-2 tablets (500-1,000 mg total) by mouth 2 (two) times daily as needed for moderate pain or headache. 30 tablet 0   aspirin EC 81  MG tablet Take 81 mg by mouth daily. Swallow whole.     carboxymethylcellulose (REFRESH PLUS) 0.5 % SOLN Place 1 drop into both eyes 2 (two) times daily as needed (dry eyes).     Cholecalciferol (VITAMIN D3) 50 MCG (2000 UT) capsule Take 2,000 Units by mouth daily.     diltiazem (CARDIZEM) 60 MG tablet TAKE 1 TABLET (60 MG TOTAL) BY MOUTH 4 (FOUR) TIMES DAILY. 120 tablet 1   esomeprazole (NEXIUM) 40 MG capsule TAKE 1 CAPSULE (40 MG TOTAL) BY MOUTH 2 (TWO)  TIMES DAILY BEFORE A MEAL. 60 capsule 2   FLUoxetine (PROZAC) 40 MG capsule Take 40 mg by mouth daily.     furosemide (LASIX) 40 MG tablet Take 1 tablet (40 mg total) by mouth daily. 30 tablet 1   ibuprofen (ADVIL,MOTRIN) 200 MG tablet Take 400-600 mg by mouth 2 (two) times daily as needed for headache or moderate pain.     metoprolol succinate (TOPROL-XL) 50 MG 24 hr tablet Take 50 mg by mouth daily. Take with or immediately following a meal.     nitroGLYCERIN (NITROSTAT) 0.4 MG SL tablet DISSOLVE 1 TABLET UNDER THE TONGUE ONCE FOR EPIGASTRIC PAIN/CHEST PAIN NOT RELIEVED BY PEPPERMINT ALTOIDS. IF THIS DOES NOT RESOLVE PAIN, CALL DR! (Patient taking differently: Place 0.4 mg under the tongue every 5 (five) minutes as needed for chest pain.) 25 tablet 1   potassium chloride SA (KLOR-CON) 20 MEQ tablet Take 2 tablets (40 mEq total) by mouth daily. 10 tablet 0   rosuvastatin (CRESTOR) 20 MG tablet Take 1 tablet (20 mg total) by mouth daily. 90 tablet 3   ticagrelor (BRILINTA) 90 MG TABS tablet Take 1 tablet (90 mg total) by mouth 2 (two) times daily. 180 tablet 3   valsartan (DIOVAN) 320 MG tablet Take 1 tablet (320 mg total) by mouth daily. 90 tablet 3   vitamin B-12 (CYANOCOBALAMIN) 100 MCG tablet Take 100 mcg by mouth daily.     No facility-administered medications prior to visit.    Review of Systems  Review of Systems  Constitutional: Negative.   HENT: Negative.    Respiratory:  Positive for cough. Negative for apnea, chest tightness, shortness of breath and wheezing.        Scant cough with clear mucus  Cardiovascular:  Negative for chest pain and leg swelling.    Physical Exam  BP 122/68 (BP Location: Right Arm, Cuff Size: Normal)   Pulse 88   Temp 97.7 F (36.5 C) (Temporal)   Ht 5\' 4"  (1.626 m)   Wt 211 lb (95.7 kg)   SpO2 96% Comment: RA  BMI 36.22 kg/m  Physical Exam Constitutional:      Appearance: Normal appearance.  HENT:     Head: Normocephalic and atraumatic.   Cardiovascular:     Rate and Rhythm: Normal rate and regular rhythm.     Comments: No edema Pulmonary:     Effort: Pulmonary effort is normal.     Breath sounds: No wheezing, rhonchi or rales.     Comments: CTA; O2 96% RA  Skin:    General: Skin is warm and dry.  Neurological:     General: No focal deficit present.     Mental Status: She is alert and oriented to person, place, and time. Mental status is at baseline.  Psychiatric:        Mood and Affect: Mood normal.        Behavior: Behavior normal.  Thought Content: Thought content normal.        Judgment: Judgment normal.     Lab Results:  CBC    Component Value Date/Time   WBC 6.3 07/05/2020 0239   RBC 3.41 (L) 07/05/2020 0239   HGB 10.9 (L) 07/05/2020 0239   HGB 14.3 04/05/2020 1355   HCT 32.3 (L) 07/05/2020 0239   HCT 43.2 04/05/2020 1355   PLT 325 07/05/2020 0239   PLT 257 04/05/2020 1355   MCV 94.7 07/05/2020 0239   MCV 94 04/05/2020 1355   MCH 32.0 07/05/2020 0239   MCHC 33.7 07/05/2020 0239   RDW 14.2 07/05/2020 0239   RDW 13.0 04/05/2020 1355   LYMPHSABS 0.9 07/05/2020 0239   MONOABS 1.0 07/05/2020 0239   EOSABS 0.2 07/05/2020 0239   BASOSABS 0.1 07/05/2020 0239    BMET    Component Value Date/Time   NA 134 (L) 07/05/2020 0239   NA 140 05/17/2020 1051   K 3.3 (L) 07/05/2020 0239   CL 101 07/05/2020 0239   CO2 26 07/05/2020 0239   GLUCOSE 123 (H) 07/05/2020 0239   BUN 30 (H) 07/05/2020 0239   BUN 22 05/17/2020 1051   CREATININE 1.36 (H) 07/05/2020 0239   CALCIUM 9.6 07/05/2020 0239   GFRNONAA 41 (L) 07/05/2020 0239   GFRAA 57 (L) 03/26/2020 1125    BNP    Component Value Date/Time   BNP 23.5 07/02/2020 1832    ProBNP No results found for: PROBNP  Imaging: DG Chest 2 View  Result Date: 07/02/2020 CLINICAL DATA:  Shortness of breath.  Symptoms for 3 weeks. EXAM: CHEST - 2 VIEW COMPARISON:  09/12/2017 FINDINGS: Post median sternotomy. Normal heart size and mediastinal contours.  Coronary stent. Minor left basilar scarring. No acute airspace disease, pleural effusion, or pneumothorax. No pulmonary edema. No acute osseous abnormalities are seen. IMPRESSION: No acute chest findings. Minor left basilar scarring. Electronically Signed   By: Keith Rake M.D.   On: 07/02/2020 16:14   CT Angio Chest PE W and/or Wo Contrast  Result Date: 07/02/2020 CLINICAL DATA:  Shortness of breath for several weeks EXAM: CT ANGIOGRAPHY CHEST WITH CONTRAST TECHNIQUE: Multidetector CT imaging of the chest was performed using the standard protocol during bolus administration of intravenous contrast. Multiplanar CT image reconstructions and MIPs were obtained to evaluate the vascular anatomy. CONTRAST:  89mL OMNIPAQUE IOHEXOL 350 MG/ML SOLN COMPARISON:  Chest x-ray from earlier in the same day. FINDINGS: Cardiovascular: Thoracic aorta and its branches demonstrate atherosclerotic calcifications without aneurysmal dilatation or dissection. No cardiac enlargement is noted. Coronary calcifications are noted. Pulmonary artery is well visualized with a normal branching pattern. No intraluminal filling defect is identified. Mediastinum/Nodes: Thoracic inlet is within normal limits. No hilar or mediastinal adenopathy is noted. The esophagus as visualized is within normal limits with the exception of a small sliding-type hiatal hernia. Lungs/Pleura: Lungs are well aerated bilaterally. No focal confluent infiltrate is seen. Some generalized increased hazy density is noted throughout both lungs consistent with mild parenchymal edema. No sizable effusion is seen. Mild emphysematous changes are noted. Upper Abdomen: 19 mm left adrenal lesion is noted and stable from a prior CT from July 2020 consistent with an adenoma. The remainder of the upper abdomen is within normal limits. Musculoskeletal: Degenerative changes of the thoracic spine are noted. No acute bony abnormality is seen. Old rib fractures are noted on the  left. Review of the MIP images confirms the above findings. IMPRESSION: No evidence of pulmonary emboli. Changes consistent  with mild pulmonary edema and vascular congestion. Small sliding-type hiatal hernia. Stable left adrenal lesion consistent with adenoma. Aortic Atherosclerosis (ICD10-I70.0) and Emphysema (ICD10-J43.9). Electronically Signed   By: Inez Catalina M.D.   On: 07/02/2020 20:31   CT Chest High Resolution  Result Date: 07/04/2020 CLINICAL DATA:  75 year old female with history of chest pain and shortness of breath. Evaluate for interstitial lung disease. EXAM: CT CHEST WITHOUT CONTRAST TECHNIQUE: Multidetector CT imaging of the chest was performed following the standard protocol without intravenous contrast. High resolution imaging of the lungs, as well as inspiratory and expiratory imaging, was performed. COMPARISON:  Chest CTA 07/02/2020. FINDINGS: Comment: Today's study is limited by considerable patient respiratory motion. Cardiovascular: Heart size is normal. There is no significant pericardial fluid, thickening or pericardial calcification. There is aortic atherosclerosis, as well as atherosclerosis of the great vessels of the mediastinum and the coronary arteries, including calcified atherosclerotic plaque in the left main, left anterior descending and right coronary arteries. Calcifications of the aortic valve. Mediastinum/Nodes: No pathologically enlarged mediastinal or hilar lymph nodes. Please note that accurate exclusion of hilar adenopathy is limited on noncontrast CT scans. Small hiatal hernia. No axillary lymphadenopathy. Lungs/Pleura: On today's motion limited examination there does appear to be widespread but patchy areas of ground-glass attenuation. On inspiratory images which are less affected by motion, there is some very mild septal thickening and scattered areas of mild cylindrical bronchiectasis. Findings do appear to have a mild craniocaudal gradient. Inspiratory and  expiratory imaging is otherwise unremarkable. No acute consolidative airspace disease. No pleural effusions. Upper Abdomen: Aortic atherosclerosis. 2.1 x 1.7 cm low-attenuation (8 Hounsfield unit) left adrenal nodule, similar to prior studies, compatible with a benign adenoma. Aortic atherosclerosis. Musculoskeletal: Median sternotomy wires. There are no aggressive appearing lytic or blastic lesions noted in the visualized portions of the skeleton. IMPRESSION: 1. Limited study demonstrating findings suggestive of mild interstitial lung disease, with a spectrum of findings categorized as probable usual interstitial pneumonia (UIP) per current ats guidelines. Repeat high-resolution chest CT is recommended in 12 months to assess for temporal changes in the appearance of the lung parenchyma. 2. Aortic atherosclerosis, in addition to left main and 2 vessel coronary artery disease. Assessment for potential risk factor modification, dietary therapy or pharmacologic therapy may be warranted, if clinically indicated. 3. Small hiatal hernia. 4. Small left adrenal adenoma again noted. Aortic Atherosclerosis (ICD10-I70.0). Electronically Signed   By: Vinnie Langton M.D.   On: 07/04/2020 10:04   ECHOCARDIOGRAM COMPLETE  Result Date: 07/03/2020    ECHOCARDIOGRAM REPORT   Patient Name:   PHILOMENE HAFF Noyola Date of Exam: 07/03/2020 Medical Rec #:  [id number]         Height:       64.0 in Accession #:    [id number]        Weight:       212.7 lb Date of Birth:  [birth year]         BSA:          2.008 m Patient Age:    76 years          BP:           127/64 mmHg Patient Gender: F                 HR:           60 bpm. Exam Location:  Inpatient Procedure: 2D Echo, Cardiac Doppler and Color Doppler Indications:    Dyspnea  History:  Patient has prior history of Echocardiogram examinations, most                 recent 07/25/2017. CAD, Mitral Valve Disease and Aortic Valve                 Disease; Risk Factors:Hypertension and  Dyslipidemia. S/P DES to                 LAD 04/2020. GERD. DOE.  Sonographer:    Clayton Lefort RDCS (AE) Referring Phys: [place]  1. Left ventricular ejection fraction, by estimation, is 60 to 65%. The left ventricle has normal function. The left ventricle has no regional wall motion abnormalities. There is moderate left ventricular hypertrophy of the basal-septal segment. Left ventricular diastolic parameters are consistent with Grade I diastolic dysfunction (impaired relaxation).  2. Right ventricular systolic function is normal. The right ventricular size is normal. There is mildly elevated pulmonary artery systolic pressure. The estimated right ventricular systolic pressure is 40.9 mmHg.  3. The mitral valve is normal in structure. Mild mitral valve regurgitation. No evidence of mitral stenosis.  4. The aortic valve is normal in structure. There is mild calcification of the aortic valve. There is mild thickening of the aortic valve. Aortic valve regurgitation is mild to moderate. Mild to moderate aortic valve sclerosis/calcification is present, without any evidence of aortic stenosis. Aortic valve mean gradient measures 6.0 mmHg. Aortic valve Vmax measures 1.65 m/s.  5. The inferior vena cava is normal in size with greater than 50% respiratory variability, suggesting right atrial pressure of 3 mmHg. FINDINGS  Left Ventricle: Left ventricular ejection fraction, by estimation, is 60 to 65%. The left ventricle has normal function. The left ventricle has no regional wall motion abnormalities. The left ventricular internal cavity size was normal in size. There is  moderate left ventricular hypertrophy of the basal-septal segment. Left ventricular diastolic parameters are consistent with Grade I diastolic dysfunction (impaired relaxation). Right Ventricle: The right ventricular size is normal. No increase in right ventricular wall thickness. Right ventricular systolic function is normal. There is  mildly elevated pulmonary artery systolic pressure. The tricuspid regurgitant velocity is 2.97  m/s, and with an assumed right atrial pressure of 3 mmHg, the estimated right ventricular systolic pressure is 81.1 mmHg. Left Atrium: Left atrial size was normal in size. Right Atrium: Right atrial size was normal in size. Pericardium: There is no evidence of pericardial effusion. Mitral Valve: The mitral valve is normal in structure. Mild mitral valve regurgitation. No evidence of mitral valve stenosis. MV peak gradient, 5.1 mmHg. The mean mitral valve gradient is 2.0 mmHg. Tricuspid Valve: The tricuspid valve is normal in structure. Tricuspid valve regurgitation is trivial. No evidence of tricuspid stenosis. Aortic Valve: The aortic valve is normal in structure. There is mild calcification of the aortic valve. There is mild thickening of the aortic valve. Aortic valve regurgitation is mild to moderate. Aortic regurgitation PHT measures 606 msec. Mild to moderate aortic valve sclerosis/calcification is present, without any evidence of aortic stenosis. Aortic valve mean gradient measures 6.0 mmHg. Aortic valve peak gradient measures 10.9 mmHg. Aortic valve area, by VTI measures 2.97 cm. Pulmonic Valve: The pulmonic valve was normal in structure. Pulmonic valve regurgitation is not visualized. No evidence of pulmonic stenosis. Aorta: The aortic root is normal in size and structure. Venous: The inferior vena cava is normal in size with greater than 50% respiratory variability, suggesting right atrial pressure of 3 mmHg. IAS/Shunts: No  atrial level shunt detected by color flow Doppler.  LEFT VENTRICLE PLAX 2D LVIDd:         4.30 cm  Diastology LVIDs:         2.40 cm  LV e' medial:    7.07 cm/s LV PW:         1.00 cm  LV E/e' medial:  11.3 LV IVS:        1.60 cm  LV e' lateral:   7.62 cm/s LVOT diam:     2.00 cm  LV E/e' lateral: 10.5 LV SV:         101 LV SV Index:   51 LVOT Area:     3.14 cm  RIGHT VENTRICLE              IVC RV Basal diam:  3.30 cm     IVC diam: 1.20 cm RV S prime:     12.10 cm/s TAPSE (M-mode): 2.0 cm LEFT ATRIUM             Index       RIGHT ATRIUM           Index LA diam:        3.30 cm 1.64 cm/m  RA Area:     18.20 cm LA Vol (A2C):   65.8 ml 32.76 ml/m RA Volume:   54.00 ml  26.89 ml/m LA Vol (A4C):   46.9 ml 23.35 ml/m LA Biplane Vol: 60.7 ml 30.22 ml/m  AORTIC VALVE AV Area (Vmax):    2.80 cm AV Area (Vmean):   2.97 cm AV Area (VTI):     2.97 cm AV Vmax:           165.00 cm/s AV Vmean:          111.000 cm/s AV VTI:            0.342 m AV Peak Grad:      10.9 mmHg AV Mean Grad:      6.0 mmHg LVOT Vmax:         147.00 cm/s LVOT Vmean:        105.000 cm/s LVOT VTI:          0.323 m LVOT/AV VTI ratio: 0.94 AI PHT:            606 msec  AORTA Ao Root diam: 3.00 cm Ao Asc diam:  3.10 cm MITRAL VALVE               TRICUSPID VALVE MV Area (PHT): 3.03 cm    TR Peak grad:   35.3 mmHg MV Area VTI:   2.71 cm    TR Vmax:        297.00 cm/s MV Peak grad:  5.1 mmHg MV Mean grad:  2.0 mmHg    SHUNTS MV Vmax:       1.13 m/s    Systemic VTI:  0.32 m MV Vmean:      63.4 cm/s   Systemic Diam: 2.00 cm MV Decel Time: 250 msec MV E velocity: 79.70 cm/s MV A velocity: 97.30 cm/s MV E/A ratio:  0.82 Candee Furbish MD Electronically signed by Candee Furbish MD Signature Date/Time: 07/03/2020/6:44:26 PM    Final       Assessment & Plan:   NSIP (nonspecific interstitial pneumonia) [facility]) - Admitted May 2022 for acute hypoxic respiratory failure felt to be due to pulmonary fibrosis and component of heart failure. Preceeding this she had some voice hoarseness and chest tightness likely  due to an URI/ ILD exacerbation. She is now feeling better and has no residual respiratory symptoms. CT imaging has improved since July 20220. HRCT 07/04/20 showed mild ILD with spectrum of findings categorized as probable UIP. Needs repeat imaging in 12 months to assess for temporal changes. We will repeat PFTs to monitor lung function.       Martyn Ehrich, NP 07/27/2020

## 2020-07-19 ENCOUNTER — Other Ambulatory Visit (HOSPITAL_COMMUNITY): Payer: Medicare HMO

## 2020-07-19 ENCOUNTER — Other Ambulatory Visit (HOSPITAL_COMMUNITY)
Admission: RE | Admit: 2020-07-19 | Discharge: 2020-07-19 | Disposition: A | Discharge status: Home / Self Care | Payer: Medicare HMO | Source: Ambulatory Visit | Attending: Primary Care | Admitting: Primary Care

## 2020-07-19 DIAGNOSIS — Z01812 Encounter for preprocedural laboratory examination: Secondary | ICD-10-CM | POA: Diagnosis not present

## 2020-07-19 DIAGNOSIS — Z20822 Contact with and (suspected) exposure to covid-19: Secondary | ICD-10-CM | POA: Insufficient documentation

## 2020-07-19 LAB — SARS CORONAVIRUS 2 (TAT 6-24 HRS): SARS Coronavirus 2: NEGATIVE

## 2020-07-22 ENCOUNTER — Ambulatory Visit (INDEPENDENT_AMBULATORY_CARE_PROVIDER_SITE_OTHER): Payer: Medicare HMO | Admitting: Internal Medicine

## 2020-07-22 ENCOUNTER — Other Ambulatory Visit: Payer: Self-pay

## 2020-07-22 DIAGNOSIS — J849 Interstitial pulmonary disease, unspecified: Secondary | ICD-10-CM

## 2020-07-22 LAB — PULMONARY FUNCTION TEST
DL/VA % pred: 90 %
DL/VA: 3.71 ml/min/mmHg/L
DLCO cor % pred: 83 %
DLCO cor: 15.94 ml/min/mmHg
DLCO unc % pred: 76 %
DLCO unc: 14.56 ml/min/mmHg
FEF 25-75 Post: 2.61 L/sec
FEF 25-75 Pre: 2.68 L/sec
FEF2575-%Change-Post: -2 %
FEF2575-%Pred-Post: 157 %
FEF2575-%Pred-Pre: 161 %
FEV1-%Change-Post: 1 %
FEV1-%Pred-Post: 107 %
FEV1-%Pred-Pre: 105 %
FEV1-Post: 2.26 L
FEV1-Pre: 2.23 L
FEV1FVC-%Change-Post: 1 %
FEV1FVC-%Pred-Pre: 114 %
FEV6-%Change-Post: 0 %
FEV6-%Pred-Post: 96 %
FEV6-%Pred-Pre: 96 %
FEV6-Post: 2.59 L
FEV6-Pre: 2.57 L
FEV6FVC-%Change-Post: 0 %
FEV6FVC-%Pred-Post: 105 %
FEV6FVC-%Pred-Pre: 105 %
FVC-%Change-Post: 0 %
FVC-%Pred-Post: 92 %
FVC-%Pred-Pre: 92 %
FVC-Post: 2.59 L
FVC-Pre: 2.59 L
Post FEV1/FVC ratio: 87 %
Post FEV6/FVC ratio: 100 %
Pre FEV1/FVC ratio: 86 %
Pre FEV6/FVC Ratio: 100 %
RV % pred: 71 %
RV: 1.65 L
TLC % pred: 90 %
TLC: 4.55 L

## 2020-07-22 NOTE — Progress Notes (Signed)
PFT done today. 

## 2020-07-27 ENCOUNTER — Ambulatory Visit (INDEPENDENT_AMBULATORY_CARE_PROVIDER_SITE_OTHER): Payer: Medicare HMO | Admitting: Primary Care

## 2020-07-27 ENCOUNTER — Other Ambulatory Visit: Payer: Self-pay

## 2020-07-27 ENCOUNTER — Encounter: Payer: Self-pay | Admitting: Cardiovascular Disease

## 2020-07-27 DIAGNOSIS — J8489 Other specified interstitial pulmonary diseases: Secondary | ICD-10-CM

## 2020-07-27 NOTE — Progress Notes (Addendum)
Virtual Visit via Telephone Note  I connected with Alison Ayala on 07/27/20 at  9:00 AM EDT by telephone and verified that I am speaking with the correct person using two identifiers.  Location: Patient: Home Provider: Office    I discussed the limitations, risks, security and privacy concerns of performing an evaluation and management service by telephone and the availability of in person appointments. I also discussed with the patient that there may be a patient responsible charge related to this service. The patient expressed understanding and agreed to proceed.   History of Present Illness: 75 year old female, never smoked.  Past medical history significant for NSIP, congestive heart failure, coronary artery disease, unstable angina, hypertension, GERD.  Patient of Dr. Chase Caller last seen in office on 10/08/2018 by pulmonary nurse practitioner.  Admitted from 07/02/2020 - 07/05/2020 for acute hypoxic respiratory failure due to pulmonary fibrosis with component of heart failure.  Patient reported shortness of breath x3 weeks with associated voice hoarseness and chest tightness.  O2 saturations 85 to 88%.  CTA imaging on 5/27 showed no evidence of pulmonary embolism, changes consistent with mild pulmonary edema and vascular congestion. Mild emphysema. HCTZ was discontinued and patient was started on Lasix and oxygen.  She had cardiac cath in March 2022 which showed severe ostial/proximal LAD stenosis, treated with PTCA/DES x 1 with optimization with OCT imaging. She had no obstruction otherwise. She is current on brilanta.    07/27/2020- Interim hx  Patient was contacted today to review PFT results. Her breath test showed normal spirometry and DLCO. No evidence of worsening pulmonary fibrosis. She has been checking her oxygen at home and her readings are consistently >90%. Very rarely she will see her O2 drop to 89%. She has an apt with cardiology in approximately 1 week.     Observations/Objective:  - Able to speak in full sentences; no overt shortness of breath, wheezing or cough  Pulmonary function testing: 07/22/20 - FVC 2.59 (92%), FVC 2.26 (107%), ratio 87, DLCOcor 15.94 (83%)  02/26/2018-FVC 2.55 (88% predicted), ratio 85, FEV1 2.16 (99% predicted), DLCO 18.35 (75% predicted)  Assessment and Plan:  ILD: - Early NSIP, July 2019 - on surgical lung biopsy - No findings to indicate worsening ILD; CT imaging and PFTs remain stable - Due for repeat HRCT in May 2023  Acute respiratory failure: - Seems to have resolved, she has been monitoring her O2 saturation at home and reports O2 consistently remains >90% RA   Follow Up Instructions:  - FU with Dr. Chase Caller in Dec 2022  I discussed the assessment and treatment plan with the patient. The patient was provided an opportunity to ask questions and all were answered. The patient agreed with the plan and demonstrated an understanding of the instructions.   The patient was advised to call back or seek an in-person evaluation if the symptoms worsen or if the condition fails to improve as anticipated.  I provided 25 minutes of non-face-to-face time during this encounter.   Martyn Ehrich, NP

## 2020-07-27 NOTE — Progress Notes (Addendum)
Cardiology Office Note:    Date:  07/28/2020   ID:  Alison Ayala, DOB 12/14/1945, MRN [id number]  PCP:  Harlan Stains, MD  Cardiologist:  Acie Fredrickson   Referring MD: Harlan Stains, MD   Chief Complaint  Patient presents with   Chest Pain     Problem list 1.  Hypertension 2.  Hyperlipidemia 3.  Mitral valve prolapse 4.  Mediastinal mass  07/23/17   Alison Ayala is a 75 y.o. female with a hx of  MVP and a family hx of CAD. 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  She is never had any serious cardiac issues.  She has had heart murmur and has been diagnosed with mitral valve prolapse.  Shes been exercising at the gym for the past several years.   No CP or dyspnea.  Gets tired more easily over the past several weeks.   Needs to have her mediastinal mass removed and a lung biopsy.   Her father has a hx of CAD    October 20, 2019:  Alison Ayala is seen today for follow-up visit.  She has a history of mitral valve prolapse.  I saw her several years ago as part of a preoperative evaluation prior to removal of a mediastinal mass.  Now presents with some episodes of CP  First episode occurred after eating a BBQ sandwich. Saw GI ( [facility])  Was eventually found to have esophageal spasm Episodes seem to be related to her eating .  Has difficulty walking   Has severe episode of esophageal spasm after eating a Frosty from [facility]   Takes diltiazem QID, seems to help quite a bit  Jan. 6, 2022: Alison Ayala is seen today for follow up of her MVP and atypical cp She needs to have a GI procedure and his here partly for pre-op clearance She has mvp .  Has extensive exertional induced chest pressure.  She had a negative ETT previously  She is on cardizem 60 Q6.   She thinks the immediate release cardizem works than cardizem CD.  Her symptoms are c/w unstable angina -   Has chest pressure with any amount of of  May be esophageal spasm  but her symptoms are exercise induced.   Feb. 28, 2022 Alison Ayala is seen today for work in visit to discuss her recent coronary CT angiogram. She was seen a month ago for episodes of CP . Unstable angina . Coronary CT angio showed a significant prox LAD  Stenosis.   Coronary calcium score is  She is here to discuss cath    June, 22, 2022  Alison Ayala is seen for follow upf of her CP She was recently admitted to the hospital for Cp and dyspnea, exacerbation of HFpEF  She was on HCTZ , is now on lasix and Kdur  BMP drawn this am   We dischssed diastolic CHF   Hx of MVP Hx of CAD    Past Medical History:  Diagnosis Date   CAD (coronary artery disease)    a. s/p DES to LAD 04/2020.   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[place])    Diverticulosis    GERD (gastroesophageal reflux disease)    H/O hiatal hernia    H/O intraventricular hemorrhage 2004--  SPONTANEOUS   S/P SHUNT   Heart murmur    Hiatal hernia    Hip pain, right OCCASIONAL -- PINCHED NERVE   History of  gastric ulcer 2001   Hyperlipidemia    Hypertension    Hypoglycemia    Internal hemorrhoids    Interstitial lung disease ([place])    Jackhammer esophagus    Mediastinal mass    Mitral valve prolapse    Normal echocardiogram 30 YRS AGO   NSIP (nonspecific interstitial pneumonia) (HCC)    S/P ventriculoperitoneal shunt 2004--- PER PT SHUNT IS WORKING WELL --  NO ISSUES   SAH (subarachnoid hemorrhage) (HCC)    Squamous cell cancer of scalp and skin of neck    mole back of head   Tubular adenoma of colon     Past Surgical History:  Procedure Laterality Date   BILATERAL FRONTAL INTERVENTRICULAR CATHETER VIA TWIST DRILL HOLE  AUG 2004   SPONTANEOUS SUBARACHNOID  HEMORRHAGE AND  INTRAVENTRICULAR HEMORRHAGE   CARPAL TUNNEL RELEASE  07/26/2011   Procedure: CARPAL TUNNEL RELEASE;  Surgeon: Magnus Sinning, MD;  Location: [place];  Service: Orthopedics;  Laterality: Right;    CATARACT EXTRACTION W/ INTRAOCULAR LENS  IMPLANT, BILATERAL  2012   BILATERAL   CORONARY STENT INTERVENTION N/A 04/08/2020   Procedure: CORONARY STENT INTERVENTION;  Surgeon: Burnell Blanks, MD;  Location: [place] [facility];  Service: Cardiovascular;  Laterality: N/A;   DUPUYTREN CONTRACTURE RELEASE  07/26/2011   Procedure: DUPUYTREN CONTRACTURE RELEASE;  Surgeon: Magnus Sinning, MD;  Location: [place];  Service: Orthopedics;  Laterality: Right;  EXCISION OF DUPYTRENS BAND THUMB- INDEX WEB SPACE   ESOPHAGEAL MANOMETRY N/A 01/27/2019   Procedure: ESOPHAGEAL MANOMETRY (EM);  Surgeon: Jerene Bears, MD;  Location: [facility];  Service: Gastroenterology;  Laterality: N/A;   EYE SURGERY Bilateral    scrapped corneas   INTRAVASCULAR IMAGING/OCT N/A 04/08/2020   Procedure: INTRAVASCULAR IMAGING/OCT;  Surgeon: Burnell Blanks, MD;  Location: [place] [facility];  Service: Cardiovascular;  Laterality: N/A;   LEFT HEART CATH AND CORONARY ANGIOGRAPHY N/A 04/08/2020   Procedure: LEFT HEART CATH AND CORONARY ANGIOGRAPHY;  Surgeon: Burnell Blanks, MD;  Location: [place] [facility];  Service: Cardiovascular;  Laterality: N/A;   LUNG BIOPSY Right 08/07/2017   Procedure: Open LUNG BIOPSY;  Surgeon: Ivin Poot, MD;  Location: [place];  Service: Thoracic;  Laterality: Right;   RESECTION OF MEDIASTINAL MASS N/A 08/07/2017   Procedure: RESECTION OF MEDIASTINAL MASS;  Surgeon: Ivin Poot, MD;  Location: [place];  Service: Thoracic;  Laterality: N/A;   RETINAL DETACHMENT REPAIR W/ SCLERAL BUCKLE LE  03-10-2011   LEFT EYE   STERNOTOMY N/A 08/07/2017   Procedure: STERNOTOMY;  Surgeon: Ivin Poot, MD;  Location: [place];  Service: Thoracic;  Laterality: N/A;   VENTRICULOPERITONEAL SHUNT  10-08-2002   RIGHT FRONTAL FOR COMMUNICATING HYDROCEPHALUS    Current Medications: No outpatient medications have been marked as taking for the 07/28/20 encounter (Office Visit) with  Pollyann Roa, Wonda Cheng, MD.     Allergies:   Carvedilol, Claritin [loratadine], Lipitor [atorvastatin], and Lisinopril   Social History   Socioeconomic History   Marital status: Married    Spouse name: Don   Number of children: 0   Years of education: Not on file   Highest education level: Not on file  Occupational History   Occupation: retired  Tobacco Use   Smoking status: Never   Smokeless tobacco: Never  Vaping Use   Vaping Use: Never used  Substance and Sexual Activity   Alcohol use: Yes    Alcohol/week: 2.0 standard drinks  Types: 2 Glasses of wine per week    Comment: OCCASIONAL   Drug use: No   Sexual activity: Not on file  Other Topics Concern   Not on file  Social History Narrative   Not on file   Social Determinants of Health   Financial Resource Strain: Not on file  Food Insecurity: Not on file  Transportation Needs: Not on file  Physical Activity: Not on file  Stress: Not on file  Social Connections: Not on file     Family History: The patient's family history includes Breast cancer in her maternal aunt; Colon cancer (age of onset: 56) in her mother; Colon polyps in her mother; Stomach cancer in her maternal aunt; Uterine cancer in her maternal aunt. There is no history of Rectal cancer or Esophageal cancer.  ROS:   Please see the history of present illness.     All other systems reviewed and are negative.  EKGs/Labs/Other Studies Reviewed:      EKG:    Recent Labs: 07/02/2020: B Natriuretic Peptide 23.5 07/03/2020: Magnesium 1.8; TSH 3.230 07/05/2020: ALT 21; BUN 30; Creatinine, Ser 1.36; Hemoglobin 10.9; Platelets 325; Potassium 3.3; Sodium 134  Recent Lipid Panel No results found for: CHOL, TRIG, HDL, CHOLHDL, VLDL, LDLCALC, LDLDIRECT  Physical Exam:    Physical Exam: Blood pressure 122/62, pulse 76, height 5\' 4"  (1.626 m), weight 212 lb 6.4 oz (96.3 kg).  GEN:  Well nourished, well developed in no acute distress HEENT: Normal NECK: No  JVD; No carotid bruits LYMPHATICS: No lymphadenopathy CARDIAC: RRR , no murmurs, rubs, gallops RESPIRATORY:  Clear to auscultation without rales, wheezing or rhonchi  ABDOMEN: Soft, non-tender, non-distended MUSCULOSKELETAL:  No edema; No deformity  SKIN: Warm and dry NEUROLOGIC:  Alert and oriented x 3    ECG :      ASSESSMENT:    1. Coronary artery disease involving native coronary artery of native heart without angina pectoris   2. Acute on chronic diastolic congestive heart failure ([place])   3. Essential hypertension     PLAN:       1.  Unstable angina / CAD:    no CP  She did have some indigestion like CP Previous stent to LAD  in March , 2022; LCX looked good  RCA was small and non dominant     2.  Acute on chronic diastolic CHF Better with lasix Labs drawn today   3.  HTN:   BP is stable     .   Medication Adjustments/Labs and Tests Ordered: Current medicines are reviewed at length with the patient today.  Concerns regarding medicines are outlined above.  No orders of the defined types were placed in this encounter.  No orders of the defined types were placed in this encounter.    Patient Instructions  Medication Instructions:  Your physician recommends that you continue on your current medications as directed. Please refer to the Current Medication list given to you today.  *If you need a refill on your cardiac medications before your next appointment, please call your pharmacy*   Lab Work: none If you have labs (blood work) drawn today and your tests are completely normal, you will receive your results only by: [place] (if you have MyChart) OR A paper copy in the mail If you have any lab test that is abnormal or we need to change your treatment, we will call you to review the results.   Testing/Procedures: none   Follow-Up: At [facility],  you and your health needs are our priority.  As part of our continuing mission to provide  you with exceptional heart care, we have created designated Provider Care Teams.  These Care Teams include your primary Cardiologist (physician) and Advanced Practice Providers (APPs -  Physician Assistants and Nurse Practitioners) who all work together to provide you with the care you need, when you need it.  We recommend signing up for the patient portal called "MyChart".  Sign up information is provided on this After Visit Summary.  MyChart is used to connect with patients for Virtual Visits (Telemedicine).  Patients are able to view lab/test results, encounter notes, upcoming appointments, etc.  Non-urgent messages can be sent to your provider as well.   To learn more about what you can do with MyChart, go to [email or web address].    Your next appointment:   6 month(s)  The format for your next appointment:   In Person  Provider:   Mertie Moores, MD   Signed, Mertie Moores, MD  07/28/2020 1:47 PM    [place]

## 2020-07-27 NOTE — Patient Instructions (Signed)
Pulmonary function testing remains stable Follow-up with Dr. Chase Caller in December 2022 Due for repeat CT chest in May 2023

## 2020-07-27 NOTE — Assessment & Plan Note (Addendum)
-   Admitted May 2022 for acute hypoxic respiratory failure felt to be due to pulmonary fibrosis and component of heart failure. Preceeding this she had some voice hoarseness and chest tightness likely due to an URI/ ILD exacerbation. She is now feeling better and has no residual respiratory symptoms. CT imaging has improved since July 20220. HRCT 07/04/20 showed mild ILD with spectrum of findings categorized as probable UIP. Needs repeat imaging in 12 months to assess for temporal changes. We will repeat PFTs to monitor lung function.

## 2020-07-28 ENCOUNTER — Other Ambulatory Visit: Payer: Self-pay

## 2020-07-28 ENCOUNTER — Other Ambulatory Visit: Payer: Medicare HMO | Admitting: *Deleted

## 2020-07-28 ENCOUNTER — Ambulatory Visit: Payer: Medicare HMO | Admitting: Cardiovascular Disease

## 2020-07-28 VITALS — BP 122/62 | HR 76 | Ht 64.0 in | Wt 212.4 lb

## 2020-07-28 DIAGNOSIS — I10 Essential (primary) hypertension: Secondary | ICD-10-CM | POA: Diagnosis not present

## 2020-07-28 DIAGNOSIS — I2510 Atherosclerotic heart disease of native coronary artery without angina pectoris: Secondary | ICD-10-CM

## 2020-07-28 DIAGNOSIS — I200 Unstable angina: Secondary | ICD-10-CM

## 2020-07-28 DIAGNOSIS — I5033 Acute on chronic diastolic (congestive) heart failure: Secondary | ICD-10-CM | POA: Diagnosis not present

## 2020-07-28 LAB — HEPATIC FUNCTION PANEL
ALT: 9 IU/L (ref 0–32)
AST: 15 IU/L (ref 0–40)
Albumin: 4.6 g/dL (ref 3.7–4.7)
Alkaline Phosphatase: 86 IU/L (ref 44–121)
Bilirubin Total: 0.4 mg/dL (ref 0.0–1.2)
Bilirubin, Direct: 0.15 mg/dL (ref 0.00–0.40)
Total Protein: 6.5 g/dL (ref 6.0–8.5)

## 2020-07-28 LAB — LIPID PANEL
Chol/HDL Ratio: 3.3 ratio (ref 0.0–4.4)
Cholesterol, Total: 159 mg/dL (ref 100–199)
HDL: 48 mg/dL (ref >39)
LDL Chol Calc (NIH): 92 mg/dL (ref 0–99)
Triglycerides: 103 mg/dL (ref 0–149)
VLDL Cholesterol Cal: 19 mg/dL (ref 5–40)

## 2020-07-28 NOTE — Patient Instructions (Signed)
Medication Instructions:  Your physician recommends that you continue on your current medications as directed. Please refer to the Current Medication list given to you today.  *If you need a refill on your cardiac medications before your next appointment, please call your pharmacy*   Lab Work: none If you have labs (blood work) drawn today and your tests are completely normal, you will receive your results only by: [place] (if you have MyChart) OR A paper copy in the mail If you have any lab test that is abnormal or we need to change your treatment, we will call you to review the results.   Testing/Procedures: none   Follow-Up: At [facility], you and your health needs are our priority.  As part of our continuing mission to provide you with exceptional heart care, we have created designated Provider Care Teams.  These Care Teams include your primary Cardiologist (physician) and Advanced Practice Providers (APPs -  Physician Assistants and Nurse Practitioners) who all work together to provide you with the care you need, when you need it.  We recommend signing up for the patient portal called "MyChart".  Sign up information is provided on this After Visit Summary.  MyChart is used to connect with patients for Virtual Visits (Telemedicine).  Patients are able to view lab/test results, encounter notes, upcoming appointments, etc.  Non-urgent messages can be sent to your provider as well.   To learn more about what you can do with MyChart, go to [email or web address].    Your next appointment:   6 month(s)  The format for your next appointment:   In Person  Provider:   Mertie Moores, MD

## 2020-08-03 ENCOUNTER — Other Ambulatory Visit: Payer: Medicare HMO

## 2020-08-05 DIAGNOSIS — I509 Heart failure, unspecified: Secondary | ICD-10-CM | POA: Diagnosis not present

## 2020-08-05 DIAGNOSIS — R0600 Dyspnea, unspecified: Secondary | ICD-10-CM | POA: Diagnosis not present

## 2020-08-15 DIAGNOSIS — R0683 Snoring: Secondary | ICD-10-CM | POA: Diagnosis not present

## 2020-08-15 DIAGNOSIS — G4730 Sleep apnea, unspecified: Secondary | ICD-10-CM | POA: Diagnosis not present

## 2020-08-18 ENCOUNTER — Encounter: Payer: Self-pay | Admitting: Gastroenterology

## 2020-08-18 DIAGNOSIS — R140 Abdominal distension (gaseous): Secondary | ICD-10-CM | POA: Diagnosis not present

## 2020-08-20 ENCOUNTER — Other Ambulatory Visit: Payer: Self-pay | Admitting: Internal Medicine

## 2020-08-23 ENCOUNTER — Telehealth: Payer: Self-pay | Admitting: Primary Care

## 2020-08-23 NOTE — Telephone Encounter (Signed)
Patient had an ONO on 08/15/20, she spent 32 seconds with SpO2 <88% RA. Basal O2 93%. She does not need to use oxygen at night, we can discontinue O2.

## 2020-08-25 NOTE — Telephone Encounter (Signed)
LMTCB re: ONO results.

## 2020-08-26 NOTE — Telephone Encounter (Signed)
Called and spoke with patient. She verbalized understanding. She stated that the O2 company had already picked up her O2 so she does not need an order placed.   Nothing further needed at time of call.

## 2020-09-05 DIAGNOSIS — R0600 Dyspnea, unspecified: Secondary | ICD-10-CM | POA: Diagnosis not present

## 2020-09-05 DIAGNOSIS — I509 Heart failure, unspecified: Secondary | ICD-10-CM | POA: Diagnosis not present

## 2020-09-07 ENCOUNTER — Other Ambulatory Visit: Payer: Self-pay

## 2020-09-07 DIAGNOSIS — K6389 Other specified diseases of intestine: Secondary | ICD-10-CM

## 2020-09-07 MED ORDER — RIFAXIMIN 550 MG PO TABS: 550.0000 mg | ORAL_TABLET | Freq: Three times a day (TID) | ORAL | 0 refills | Status: AC

## 2020-09-08 ENCOUNTER — Telehealth: Payer: Self-pay

## 2020-09-08 NOTE — Telephone Encounter (Signed)
APPROVED  Medication: [place]: [employer] PA response: Nurse, adult. Notes: Alison Ayala (KeyInda Coke) Rx #: [id number] Xifaxan '550MG'$  tablets   Form [employer] Electronic PA Form Created 22 hours ago Sent to Plan 22 minutes ago Plan Response 21 minutes ago Submit Clinical Questions 18 minutes ago Determination Favorable 9 minutes ago Message from Plan PA Case: [id number], Status: Approved, Coverage Starts on: 09/08/2020 12:00:00 AM, Coverage Ends on: 12/07/2020 12:00:00 AM. Questions? Contact [phone].  Alison Ayala (Key: [id number])  This request has been approved.  Please note any additional information provided by [facility] at the bottom of your screen.

## 2020-09-08 NOTE — Telephone Encounter (Signed)
PRIOR AUTHORIZATION  PA initiation date: 09/08/20  Medication: Kim completed electronically through [employer] My Meds: Yes  Will await insurance response re: approval/denial.  Pamala Hurry s [place] (Key: [id number])  Your information has been submitted to [facility]. [employer] will review the request and will issue a decision, typically within 3-7 days from your submission. You can check the updated outcome later by reopening this request.  If [employer] has not responded in 3-7 days or if you have any questions about your ePA request, please contact [employer] at [phone]. If you think there may be a problem with your PA request, use our live chat feature at the bottom right.  For [place] requests, please call [phone].  Pamala Hurry s [place] (KeyInda Coke) Rx #: [id number] Xifaxan '550MG'$  tablets   Form [employer] Electronic PA Form Created 22 hours ago Sent to Plan 6 minutes ago Plan Response 5 minutes ago Submit Clinical Questions 2 minutes ago Determination Wait for Determination Please wait for [facility] NCPDP 2017 to return a determination.

## 2020-09-21 DIAGNOSIS — K219 Gastro-esophageal reflux disease without esophagitis: Secondary | ICD-10-CM | POA: Diagnosis not present

## 2020-09-21 DIAGNOSIS — I10 Essential (primary) hypertension: Secondary | ICD-10-CM | POA: Diagnosis not present

## 2020-09-21 DIAGNOSIS — F3341 Major depressive disorder, recurrent, in partial remission: Secondary | ICD-10-CM | POA: Diagnosis not present

## 2020-09-21 DIAGNOSIS — E785 Hyperlipidemia, unspecified: Secondary | ICD-10-CM | POA: Diagnosis not present

## 2020-09-21 DIAGNOSIS — M810 Age-related osteoporosis without current pathological fracture: Secondary | ICD-10-CM | POA: Diagnosis not present

## 2020-09-21 DIAGNOSIS — I2510 Atherosclerotic heart disease of native coronary artery without angina pectoris: Secondary | ICD-10-CM | POA: Diagnosis not present

## 2020-10-05 ENCOUNTER — Telehealth: Payer: Self-pay | Admitting: Gastroenterology

## 2020-10-05 NOTE — Telephone Encounter (Signed)
Left message on machine to call back  

## 2020-10-05 NOTE — Telephone Encounter (Signed)
Patient called states she is still having a lot of abdominal pain and is seeking further help and advise.

## 2020-10-06 DIAGNOSIS — I509 Heart failure, unspecified: Secondary | ICD-10-CM | POA: Diagnosis not present

## 2020-10-06 DIAGNOSIS — R0600 Dyspnea, unspecified: Secondary | ICD-10-CM | POA: Diagnosis not present

## 2020-10-06 NOTE — Telephone Encounter (Signed)
Line busy

## 2020-10-07 NOTE — Telephone Encounter (Signed)
The pt has continued abd pain last seen by Alonza Bogus PA on 06/29/20. Breath test positive for SIBO.  She was treated with Xifaxan.   She takes nexium 40 mg daily.  She has had no releif in symptoms.   She has been scheduled on 11/2 at 150 pm to see Dr Hilarie Fredrickson per pt request. We will contact her if an appt for 9/6 becomes available.

## 2020-10-14 DIAGNOSIS — I2510 Atherosclerotic heart disease of native coronary artery without angina pectoris: Secondary | ICD-10-CM | POA: Diagnosis not present

## 2020-10-14 DIAGNOSIS — F3341 Major depressive disorder, recurrent, in partial remission: Secondary | ICD-10-CM | POA: Diagnosis not present

## 2020-10-14 DIAGNOSIS — K219 Gastro-esophageal reflux disease without esophagitis: Secondary | ICD-10-CM | POA: Diagnosis not present

## 2020-10-14 DIAGNOSIS — M810 Age-related osteoporosis without current pathological fracture: Secondary | ICD-10-CM | POA: Diagnosis not present

## 2020-10-14 DIAGNOSIS — I10 Essential (primary) hypertension: Secondary | ICD-10-CM | POA: Diagnosis not present

## 2020-10-14 DIAGNOSIS — E785 Hyperlipidemia, unspecified: Secondary | ICD-10-CM | POA: Diagnosis not present

## 2020-10-15 DIAGNOSIS — R7303 Prediabetes: Secondary | ICD-10-CM | POA: Diagnosis not present

## 2020-10-15 DIAGNOSIS — E785 Hyperlipidemia, unspecified: Secondary | ICD-10-CM | POA: Diagnosis not present

## 2020-10-15 DIAGNOSIS — F3341 Major depressive disorder, recurrent, in partial remission: Secondary | ICD-10-CM | POA: Diagnosis not present

## 2020-10-15 DIAGNOSIS — I10 Essential (primary) hypertension: Secondary | ICD-10-CM | POA: Diagnosis not present

## 2020-10-15 DIAGNOSIS — E876 Hypokalemia: Secondary | ICD-10-CM | POA: Diagnosis not present

## 2020-10-15 DIAGNOSIS — E538 Deficiency of other specified B group vitamins: Secondary | ICD-10-CM | POA: Diagnosis not present

## 2020-10-15 DIAGNOSIS — E559 Vitamin D deficiency, unspecified: Secondary | ICD-10-CM | POA: Diagnosis not present

## 2020-10-15 DIAGNOSIS — J849 Interstitial pulmonary disease, unspecified: Secondary | ICD-10-CM | POA: Diagnosis not present

## 2020-10-15 DIAGNOSIS — R1084 Generalized abdominal pain: Secondary | ICD-10-CM | POA: Diagnosis not present

## 2020-10-22 ENCOUNTER — Telehealth: Payer: Self-pay | Admitting: Cardiovascular Disease

## 2020-10-22 NOTE — Telephone Encounter (Signed)
   Pt c/o medication issue:  1. Name of Medication:   ticagrelor (BRILINTA) 90 MG TABS tablet    2. How are you currently taking this medication (dosage and times per day)? Take 1 tablet (90 mg total) by mouth 2 (two) times daily.  3. Are you having a reaction (difficulty breathing--STAT)?   4. What is your medication issue? Pt would like to talk to Dr. Elmarie Shiley nurse about brilinta's side effects

## 2020-10-22 NOTE — Telephone Encounter (Signed)
Spoke with the patient who states that she has some concerns about the side effects of Brilinta. She reports having some shortness of breath with exertion. She states that SOB started after her stent was placed. She reports that she also has frequent headaches. States that her blood pressures have been good although she does not have any readings to provide me. Patient also reports nausea and complains of her hands shaking. Patient also wanted to let Dr. Acie Fredrickson know that she had a brain hemorrhage in 2004. Patient advised to continue current medications and that I would make Dr. Acie Fredrickson aware of her concerns.

## 2020-10-25 MED ORDER — CLOPIDOGREL BISULFATE 75 MG PO TABS: 75.0000 mg | ORAL_TABLET | Freq: Every day | ORAL | 3 refills | Status: DC

## 2020-10-25 NOTE — Addendum Note (Signed)
Addended by: Nuala Alpha on: 10/25/2020 08:36 AM   Modules accepted: Orders

## 2020-10-25 NOTE — Telephone Encounter (Signed)
Ammie, Menzel - 10/22/2020  3:04 PM Nahser, Wonda Cheng, MD  Sent: Sat October 23, 2020  2:30 PM  To: Antonieta Iba, RN; P [facility] Triage          Message  Pt had a stent placed in march 2022.  Brilinta can be associated with dyspnea due to bronchospasm.   Usually this can be relieved with a cup of coffee.   If this does not help, I'm fine with her stopping the Brilinta and starting Plavix 75 mg a day until March, 2023.    At that time, I would recommend that she stop her Aspirin and continue Plavix 75 mg a day     PN

## 2020-10-25 NOTE — Telephone Encounter (Signed)
Pt aware of recommendations per Dr. Acie Fredrickson. Pt states she would like to stop taking Brilinta and start Plavix 75 mg po daily, ASA 81 mg po daily, and drop the ASA in March 2023. Confirmed the pharmacy of choice with the pt.  Pt states she hasn't taken her first dose of Brilinta this morning, so she will go now and pick her Plavix up, and start taking it today. Pt verbalized understanding and agrees with this plan.

## 2020-12-08 ENCOUNTER — Encounter: Payer: Self-pay | Admitting: Internal Medicine

## 2020-12-08 ENCOUNTER — Ambulatory Visit: Payer: Medicare HMO | Admitting: Internal Medicine

## 2020-12-08 VITALS — BP 150/60 | HR 72 | Ht 64.0 in | Wt 210.0 lb

## 2020-12-08 DIAGNOSIS — Z86010 Personal history of colonic polyps: Secondary | ICD-10-CM

## 2020-12-08 DIAGNOSIS — K6389 Other specified diseases of intestine: Secondary | ICD-10-CM | POA: Diagnosis not present

## 2020-12-08 DIAGNOSIS — K224 Dyskinesia of esophagus: Secondary | ICD-10-CM | POA: Diagnosis not present

## 2020-12-08 DIAGNOSIS — K5900 Constipation, unspecified: Secondary | ICD-10-CM

## 2020-12-08 NOTE — Progress Notes (Signed)
   Subjective:    Patient ID: Alison Ayala, female    DOB: 1945-12-28, 75 y.o.   MRN: [id number]  HPI Alison Ayala is a 75 year old female with a history of GERD, jackhammer esophagus, SIBO, previous constipation who is here for follow-up.  She is here alone today.  She also has a history of CAD with placement of a drug-eluting stent in March 2022 now on Brilinta, hypertension, hyperlipidemia.  She was seen by Alonza Bogus, PA-C earlier this year and diagnosed with SIBO by breath test.  She was treated with rifaximin 550 mg 3 times daily x14 days.  Since this time she has had much less abdominal pain.  Her looser stools resolved and in fact she became quite constipated.  She noticed on a recent vacation to [place] going 5 days without bowel movement.  She was struck because she had no abdominal pain despite no bowel movement.  She did take a laxative when she returned home and her bowel movements are coming back to normal though still have not returned completely to normal since the rifaximin.  No blood in stool or melena.  Less bloating and belching.  She is not having heartburn.  She has been off of Nexium and diltiazem without recurrent issues with esophageal pain, indigestion or trouble swallowing.   Review of Systems As per HPI, otherwise negative  Current Medications, Allergies, Past Medical History, Past Surgical History, Family History and Social History were reviewed in [employer] record.      Objective:   Physical Exam BP (!) 150/60   Pulse 72   Ht 5\' 4"  (1.626 m)   Wt 210 lb (95.3 kg)   BMI 36.05 kg/m  Gen: awake, alert, NAD HEENT: anicteric CV: RRR, no mrg Pulm: CTA b/l Abd: soft, NT/ND, +BS throughout Ext: no c/c/e Neuro: nonfocal     Assessment & Plan:  75 year old female with a history of GERD, jackhammer esophagus, SIBO, previous constipation who is here for follow-up.   SIBO --treated successfully with rifaximin after  positive breath test.  Abdominal pain and bloating has improved.  Bowel habits change likely due to changing intestinal microbiota associated with rifaximin.  She was having less frequent bowel movements but this is improving --Symptoms improved, and retreat in the future if needed  2.  GERD/history of jackhammer esophagus --resolved of late and I wonder if some of her esophageal spasm and chest discomfort was not actually anginal in nature given improvement after PCI in March 2022 --She will remain off Nexium and diltiazem, can consider restarting if symptoms warrant in the future  3.  Mild constipation --constipation is not new for her and in the fact we actually treated with Linzess.  I will have her trial Metamucil 2 teaspoons once to twice daily  4.  History of colon polyps --surveillance colonoscopy recommended at this time but we will delay this until at least March 2023 given the need for uninterrupted Brilinta therapy after PCI with drug-eluting stent in March 2022 --Office visit in March 2023 to discuss surveillance colonoscopy and also determine if Brilinta can be safely held preprocedure  30 minutes total spent today including patient facing time, coordination of care, reviewing medical history/procedures/pertinent radiology studies, and documentation of the encounter.

## 2020-12-08 NOTE — Patient Instructions (Addendum)
Please follow up with Dr Hilarie Fredrickson in the office in March 2023. At that time, we will discuss colonoscopy with Brilinta.  Please purchase the following medications over the counter and take as directed: Metamucil 2 teaspoons daily only if needed.  If you are age 75 or older, your body mass index should be between 23-30. Your Body mass index is 36.05 kg/m. If this is out of the aforementioned range listed, please consider follow up with your Primary Care Provider.  If you are age 64 or younger, your body mass index should be between 19-25. Your Body mass index is 36.05 kg/m. If this is out of the aformentioned range listed, please consider follow up with your Primary Care Provider.  ________________________________________________________  The [place] [facility] providers would like to encourage you to use [facility] to communicate with providers for non-urgent requests or questions.  Due to long hold times on the telephone, sending your provider a message by [facility] may be a faster and more efficient way to get a response.  Please allow 48 business hours for a response.  Please remember that this is for non-urgent requests.  _______________________________________________________  Due to recent changes in healthcare laws, you may see the results of your imaging and laboratory studies on MyChart before your provider has had a chance to review them.  We understand that in some cases there may be results that are confusing or concerning to you. Not all laboratory results come back in the same time frame and the provider may be waiting for multiple results in order to interpret others.  Please give [place] 48 hours in order for your provider to thoroughly review all the results before contacting the office for clarification of your results.

## 2020-12-16 DIAGNOSIS — K219 Gastro-esophageal reflux disease without esophagitis: Secondary | ICD-10-CM | POA: Diagnosis not present

## 2020-12-16 DIAGNOSIS — M810 Age-related osteoporosis without current pathological fracture: Secondary | ICD-10-CM | POA: Diagnosis not present

## 2020-12-16 DIAGNOSIS — I2510 Atherosclerotic heart disease of native coronary artery without angina pectoris: Secondary | ICD-10-CM | POA: Diagnosis not present

## 2020-12-16 DIAGNOSIS — F3341 Major depressive disorder, recurrent, in partial remission: Secondary | ICD-10-CM | POA: Diagnosis not present

## 2020-12-16 DIAGNOSIS — E785 Hyperlipidemia, unspecified: Secondary | ICD-10-CM | POA: Diagnosis not present

## 2020-12-16 DIAGNOSIS — I10 Essential (primary) hypertension: Secondary | ICD-10-CM | POA: Diagnosis not present

## 2020-12-16 DIAGNOSIS — I5032 Chronic diastolic (congestive) heart failure: Secondary | ICD-10-CM | POA: Diagnosis not present

## 2021-01-02 ENCOUNTER — Other Ambulatory Visit: Payer: Self-pay | Admitting: Internal Medicine

## 2021-01-02 ENCOUNTER — Other Ambulatory Visit: Payer: Self-pay | Admitting: Gastroenterology

## 2021-01-03 NOTE — Telephone Encounter (Signed)
Ok to refill, but please forward to her cardiologist -- I do not want her to be without this med should she need it

## 2021-01-17 DIAGNOSIS — J849 Interstitial pulmonary disease, unspecified: Secondary | ICD-10-CM | POA: Diagnosis not present

## 2021-01-17 DIAGNOSIS — H6121 Impacted cerumen, right ear: Secondary | ICD-10-CM | POA: Diagnosis not present

## 2021-01-17 DIAGNOSIS — E785 Hyperlipidemia, unspecified: Secondary | ICD-10-CM | POA: Diagnosis not present

## 2021-01-17 DIAGNOSIS — I2510 Atherosclerotic heart disease of native coronary artery without angina pectoris: Secondary | ICD-10-CM | POA: Diagnosis not present

## 2021-01-17 DIAGNOSIS — I10 Essential (primary) hypertension: Secondary | ICD-10-CM | POA: Diagnosis not present

## 2021-01-17 DIAGNOSIS — R7303 Prediabetes: Secondary | ICD-10-CM | POA: Diagnosis not present

## 2021-01-17 DIAGNOSIS — K219 Gastro-esophageal reflux disease without esophagitis: Secondary | ICD-10-CM | POA: Diagnosis not present

## 2021-01-17 DIAGNOSIS — Z0000 Encounter for general adult medical examination without abnormal findings: Secondary | ICD-10-CM | POA: Diagnosis not present

## 2021-01-17 DIAGNOSIS — M810 Age-related osteoporosis without current pathological fracture: Secondary | ICD-10-CM | POA: Diagnosis not present

## 2021-03-03 DIAGNOSIS — Z1231 Encounter for screening mammogram for malignant neoplasm of breast: Secondary | ICD-10-CM | POA: Diagnosis not present

## 2021-04-01 DIAGNOSIS — E785 Hyperlipidemia, unspecified: Secondary | ICD-10-CM | POA: Diagnosis not present

## 2021-04-01 DIAGNOSIS — I10 Essential (primary) hypertension: Secondary | ICD-10-CM | POA: Diagnosis not present

## 2021-04-01 DIAGNOSIS — I5032 Chronic diastolic (congestive) heart failure: Secondary | ICD-10-CM | POA: Diagnosis not present

## 2021-04-21 ENCOUNTER — Other Ambulatory Visit: Payer: Self-pay | Admitting: Physician Assistant

## 2021-04-21 ENCOUNTER — Ambulatory Visit: Payer: Medicare HMO | Admitting: Internal Medicine

## 2021-04-25 DIAGNOSIS — L821 Other seborrheic keratosis: Secondary | ICD-10-CM | POA: Diagnosis not present

## 2021-04-25 DIAGNOSIS — D224 Melanocytic nevi of scalp and neck: Secondary | ICD-10-CM | POA: Diagnosis not present

## 2021-04-25 DIAGNOSIS — Z85828 Personal history of other malignant neoplasm of skin: Secondary | ICD-10-CM | POA: Diagnosis not present

## 2021-04-25 DIAGNOSIS — D485 Neoplasm of uncertain behavior of skin: Secondary | ICD-10-CM | POA: Diagnosis not present

## 2021-04-25 DIAGNOSIS — L814 Other melanin hyperpigmentation: Secondary | ICD-10-CM | POA: Diagnosis not present

## 2021-04-25 DIAGNOSIS — D1801 Hemangioma of skin and subcutaneous tissue: Secondary | ICD-10-CM | POA: Diagnosis not present

## 2021-04-25 DIAGNOSIS — L820 Inflamed seborrheic keratosis: Secondary | ICD-10-CM | POA: Diagnosis not present

## 2021-05-03 ENCOUNTER — Ambulatory Visit: Payer: Medicare HMO | Admitting: Internal Medicine

## 2021-05-03 ENCOUNTER — Telehealth: Payer: Self-pay | Admitting: *Deleted

## 2021-05-03 ENCOUNTER — Encounter: Payer: Self-pay | Admitting: Internal Medicine

## 2021-05-03 ENCOUNTER — Other Ambulatory Visit: Payer: Self-pay | Admitting: Internal Medicine

## 2021-05-03 VITALS — BP 130/80 | HR 61 | Ht 63.5 in | Wt 222.0 lb

## 2021-05-03 DIAGNOSIS — K5900 Constipation, unspecified: Secondary | ICD-10-CM

## 2021-05-03 DIAGNOSIS — Z7902 Long term (current) use of antithrombotics/antiplatelets: Secondary | ICD-10-CM | POA: Diagnosis not present

## 2021-05-03 DIAGNOSIS — R1013 Epigastric pain: Secondary | ICD-10-CM

## 2021-05-03 DIAGNOSIS — Z86010 Personal history of colonic polyps: Secondary | ICD-10-CM

## 2021-05-03 MED ORDER — PLENVU 140 G PO SOLR: 1.0000 | ORAL | 0 refills | Status: DC

## 2021-05-03 NOTE — Telephone Encounter (Signed)
Request for surgical clearance:     Endoscopy Procedure ? ?What type of surgery is being performed?     ENDOSCOPY/COLONOSCOPY ? ?When is this surgery scheduled?     06/10/21 ? ?What type of clearance is required ?   Pharmacy ? ?Are there any medications that need to be held prior to surgery and how long? PLAVIX, 5 DAYS ? ?Practice name and name of physician performing surgery?      [facility] ? ?What is your office phone and fax number?      Phone- [phone]  Fax- [phone] ? ?Anesthesia type (None, local, MAC, general) ?       MAC ? ?

## 2021-05-03 NOTE — Telephone Encounter (Signed)
Covering preop today. Will route to Dr. Acie Fredrickson for input on holding Plavix for endoscopy/colonoscopy in 06/2021. Patient had PCI to LAD 04/08/2020 and per phone notes 10/2020 will also be dropping the aspirin this month. Dr. Acie Fredrickson - please advise on whether Plavix can be held for this procedure, will be monotherapy at that time. Please route response to [facility] PREOP (the pre-op pool). Thank you. ? ?

## 2021-05-03 NOTE — Patient Instructions (Addendum)
You have been scheduled for an endoscopy and colonoscopy. Please follow the written instructions given to you at your visit today. ?Please pick up your prep supplies at the pharmacy within the next 1-3 days. ?If you use inhalers (even only as needed), please bring them with you on the day of your procedure. ? ?You will be contacted by our office prior to your procedure for directions on holding your Plavix.  If you do not hear from our office 1 week prior to your scheduled procedure, please call [phone] to discuss.  ? ?If you are age 4 or older, your body mass index should be between 23-30. Your Body mass index is 38.71 kg/m?Marland Kitchen If this is out of the aforementioned range listed, please consider follow up with your Primary Care Provider. ? ?If you are age 59 or younger, your body mass index should be between 19-25. Your Body mass index is 38.71 kg/m?Marland Kitchen If this is out of the aformentioned range listed, please consider follow up with your Primary Care Provider.  ? ?________________________________________________________ ? ?The [facility] providers would like to encourage you to use [facility] to communicate with providers for non-urgent requests or questions.  Due to long hold times on the telephone, sending your provider a message by [facility] may be a faster and more efficient way to get a response.  Please allow 48 business hours for a response.  Please remember that this is for non-urgent requests.  ?_______________________________________________________ ? ?Due to recent changes in healthcare laws, you may see the results of your imaging and laboratory studies on MyChart before your provider has had a chance to review them.  We understand that in some cases there may be results that are confusing or concerning to you. Not all laboratory results come back in the same time frame and the provider may be waiting for multiple results in order to interpret others.  Please give [place] 48 hours in order for your provider to  thoroughly review all the results before contacting the office for clarification of your results.  ? ?

## 2021-05-04 ENCOUNTER — Encounter: Payer: Self-pay | Admitting: Internal Medicine

## 2021-05-04 NOTE — Progress Notes (Signed)
? ?Subjective:  ? ? Patient ID: THU BAGGETT, female    DOB: 03-17-1945, 76 y.o.   MRN: [id number] ? ?HPI ?Alison Ayala is a 76 year old female with a history of GERD, jackhammer esophagus, SIBO, gastritis, previous constipation who is here for follow-up.  She was last seen in November 2022.  She also has a history of CAD with placement of DES in March 2022 on Plavix, hypertension and hyperlipidemia.  She is here alone today. ? ?She reports that on the whole she has been doing well though she has a chronic near constant epigastric abdominal pain.  She reports this hurts 95% of the time.  It does not seem to really change with eating.  It is a pressure type sensation like a "fist" in her upper abdomen.  This is not burning in nature.  Again is there most of the time.  She does still feel bloating.  On review of records I had noted that after rifaximin this symptom had improved.  She states that the bloating improved but she feels the epigastric pain really has persisted.  She is not having heartburn, dysphagia or odynophagia.  She is not using PPI or acid suppression. ? ?From a bowel movement standpoint occasional constipation is relieved very well by cascara.  No blood in stool or melena. ? ?She remains on Plavix under the direction of Dr. Acie Fredrickson. ? ? ?Review of Systems ?As per HPI, otherwise negative ? ?Current Medications, Allergies, Past Medical History, Past Surgical History, Family History and Social History were reviewed in [employer] record. ?   ?Objective:  ? Physical Exam ?BP 130/80   Pulse 61   Ht 5' 3.5" (1.613 m)   Wt 222 lb (100.7 kg)   BMI 38.71 kg/m?  ?Gen: awake, alert, NAD ?HEENT: anicteric,  ?CV: RRR, no mrg ?Pulm: CTA b/l ?Abd: soft, mild epigastric tenderness without rebound or guarding, nondistended, obese, +BS throughout ?Ext: no c/c/e ?Neuro: nonfocal ? ?   ?Assessment & Plan:  ? 76 year old female with a history of GERD, jackhammer esophagus, SIBO,  gastritis, previous constipation who is here for follow-up.  She was last seen in November 2022.  ? ?Epigastric abdominal pain --chronic.  I had documented that it improved with SIBO therapy but she feels possibly otherwise.  She did have gastritis on previous EGD.  I recommended we repeat upper endoscopy ?--Repeat EGD; we reviewed the risk, benefits and alternatives and she is agreeable and wishes to proceed.  Plavix will need to be held as below. ? ?2.  GERD/jackhammer esophagus --no real symptoms, query whether prior CAD was contributing to most of her esophageal symptoms.  For her epigastric pain I would not expect this to be GERD related but EGD should help sort this out and exclude esophagitis ?--She will remain off PPI for now pending EGD ? ?3.  Mild constipation --well-controlled with over-the-counter herbal ? ?4.  History of colon polyps --surveillance colonoscopy is due and we delayed this slightly given her PCI and the need to not interrupt antiplatelet therapy.  Her stent is now over 6 year old we will contact cardiology to see if reasonable to hold Plavix x5 days ?--Colonoscopy in the [place]; Plavix interruption as below ? ?5.  Chronic antiplatelet -- Hold Plavix 5 days before procedure - will instruct when and how to resume after procedure. Risks and benefits of procedure including bleeding, perforation, infection, missed lesions, medication reactions and possible hospitalization or surgery if complications occur explained. Additional rare but  real risk of cardiovascular event such as heart attack or ischemia/infarct of other organs off Plavix explained and need to seek urgent help if this occurs. Will communicate by phone or EMR with patient's prescribing provider that to confirm holding Plavix is reasonable in this case.  ? ?6.  SIBO --favorable response to rifaximin.  Bloating is minimal at this point.  We are proceeding as in #1 above.  Could reserve retreatment if EGD unrevealing. ? ?30 minutes total  spent today including patient facing time, coordination of care, reviewing medical history/procedures/pertinent radiology studies, and documentation of the encounter. ? ? ?

## 2021-05-05 ENCOUNTER — Telehealth: Payer: Self-pay | Admitting: *Deleted

## 2021-05-05 NOTE — Telephone Encounter (Signed)
Her Plavix may be held 5 days prior to her procedure.  Please resume as soon as hemostasis is achieved. ? ?Preoperative team, please contact this patient and set up a phone call appointment for further cardiac evaluation.  Thank you for your help. ? ?Jossie Ng. Drexel Ivey NP-C ? ?  ?05/05/2021, 8:25 AM ?[place] ?[address] ?Office [phone] Fax [phone] ? ?

## 2021-05-05 NOTE — Telephone Encounter (Signed)
Pt agreeable to plan of care to tele pre op appt 05/06/21 @ 10:40. Med rec and consent are done. Pt does have some questions about a few of her medications.  ?She states she was told a while back that she could stop taking  ?  ?ASA in March 2023 as it will be 1 yr.  ?  ?On her K+ directions we have are to take 2 tabs = 40 meq daily, but pt states her bottle says take 1 tab 20 meq daily and that is what she has been doing ?  ?Pt wants to now if it is ok for her to take Ibuprofen, advil etc. It is on her med list but she states she takes tylenol more so. Just wants to know if she can take NSAID ?  ?I assured her these are questions she can d/w the pre op provider tomorrow. ?

## 2021-05-05 NOTE — Telephone Encounter (Signed)
Pt agreeable to plan of care to tele pre op appt 05/06/21 @ 10:40. Med rec and consent are done. Pt does have some questions about a few of her medications.  ?She states she was told a while back that she could stop taking  ? ?ASA in March 2023 as it will be 1 yr.  ? ?On her K+ directions we have are to take 2 tabs = 40 meq daily, but pt states her bottle says take 1 tab 20 meq daily and that is what she has been doing ? ?Pt wants to now if it is ok for her to take Ibuprofen, advil etc. It is on her med list but she states she takes tylenol more so. Just wants to know if she can take NSAID ? ?I assured her these are questions she can d/w the pre op provider tomorrow. ? ?  ?Patient Consent for Virtual Visit  ? ? ?   ? ?Alison Ayala has provided verbal consent on 05/05/2021 for a virtual visit (video or telephone). ? ? ?CONSENT FOR VIRTUAL VISIT FOR:  Alison Ayala  ?By participating in this virtual visit I agree to the following: ? ?I hereby voluntarily request, consent and authorize [place] and its employed or contracted physicians, physician assistants, nurse practitioners or other licensed health care professionals (the Practitioner), to provide me with telemedicine health care services (the ?Services") as deemed necessary by the treating Practitioner. I acknowledge and consent to receive the Services by the Practitioner via telemedicine. I understand that the telemedicine visit will involve communicating with the Practitioner through live audiovisual communication technology and the disclosure of certain medical information by electronic transmission. I acknowledge that I have been given the opportunity to request an in-person assessment or other available alternative prior to the telemedicine visit and am voluntarily participating in the telemedicine visit. ? ?I understand that I have the right to withhold or withdraw my consent to the use of telemedicine in the course of my care at any time,  without affecting my right to future care or treatment, and that the Practitioner or I may terminate the telemedicine visit at any time. I understand that I have the right to inspect all information obtained and/or recorded in the course of the telemedicine visit and may receive copies of available information for a reasonable fee.  I understand that some of the potential risks of receiving the Services via telemedicine include:  ?Delay or interruption in medical evaluation due to technological equipment failure or disruption; ?Information transmitted may not be sufficient (e.g. poor resolution of images) to allow for appropriate medical decision making by the Practitioner; and/or  ?In rare instances, security protocols could fail, causing a breach of personal health information. ? ?Furthermore, I acknowledge that it is my responsibility to provide information about my medical history, conditions and care that is complete and accurate to the best of my ability. I acknowledge that Practitioner's advice, recommendations, and/or decision may be based on factors not within their control, such as incomplete or inaccurate data provided by me or distortions of diagnostic images or specimens that may result from electronic transmissions. I understand that the practice of medicine is not an exact science and that Practitioner makes no warranties or guarantees regarding treatment outcomes. I acknowledge that a copy of this consent can be made available to me via my patient portal ([place]), or I can request a printed copy by calling the office of [facility].   ? ?I  understand that my insurance will be billed for this visit.  ? ?I have read or had this consent read to me. ?I understand the contents of this consent, which adequately explains the benefits and risks of the Services being provided via telemedicine.  ?I have been provided ample opportunity to ask questions regarding this consent and the Services and  have had my questions answered to my satisfaction. ?I give my informed consent for the services to be provided through the use of telemedicine in my medical care ? ? ? ?

## 2021-05-05 NOTE — Telephone Encounter (Signed)
Left message for the pt to call back and ask with the pre op team to set up a tele appt.  ?

## 2021-05-05 NOTE — Telephone Encounter (Signed)
Pt is returning call.  

## 2021-05-06 ENCOUNTER — Ambulatory Visit (INDEPENDENT_AMBULATORY_CARE_PROVIDER_SITE_OTHER): Payer: Medicare HMO | Admitting: General Practice

## 2021-05-06 DIAGNOSIS — Z01810 Encounter for preprocedural cardiovascular examination: Secondary | ICD-10-CM

## 2021-05-06 NOTE — Progress Notes (Signed)
? ?Virtual Visit via Telephone Note  ? ?This visit type was conducted due to national recommendations for restrictions regarding the COVID-19 Pandemic (e.g. social distancing) in an effort to limit this patient's exposure and mitigate transmission in our community.  Due to her co-morbid illnesses, this patient is at least at moderate risk for complications without adequate follow up.  This format is felt to be most appropriate for this patient at this time.  The patient did not have access to video technology/had technical difficulties with video requiring transitioning to audio format only (telephone).  All issues noted in this document were discussed and addressed.  No physical exam could be performed with this format.  Please refer to the patient's chart for her  consent to telehealth for [facility]. ? ?Evaluation Performed:  Preoperative cardiovascular risk assessment ?_____________  ? ?Date:  05/06/2021  ? ?Patient ID:  Alison Ayala, Alison Ayala 01-04-1946, MRN [id number] ?Patient Location:  ?Home ?Provider location:   ?Office ? ?Primary Care Provider:  Harlan Stains, MD ?Primary Cardiologist:  Mertie Moores, MD ? ?Chief Complaint  ?  ?76 y.o. y/o female with a h/o CAD, acute on chronic diastolic CHF, HTN, who is pending colonoscopy/EGD, and presents today for telephonic preoperative cardiovascular risk assessment. ? ?Past Medical History  ?  ?Past Medical History:  ?Diagnosis Date  ? CAD (coronary artery disease)   ? a. s/p DES to LAD 04/2020.  ? Carpal tunnel syndrome of right wrist   ? 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 Diverticulosis   ? GERD (gastroesophageal reflux disease)   ? H/O hiatal hernia   ? H/O intraventricular hemorrhage 2004--  SPONTANEOUS  ? S/P SHUNT  ? Heart murmur   ? Hiatal hernia   ? Hip pain, right OCCASIONAL -- PINCHED NERVE  ? History of gastric ulcer 2001  ? Hyperlipidemia   ? Hypertension   ? Hypoglycemia   ? Internal hemorrhoids   ? Interstitial lung disease ([place])   ? Jackhammer esophagus    ? Mediastinal mass   ? Mitral valve prolapse   ? Normal echocardiogram 30 YRS AGO  ? NSIP (nonspecific interstitial pneumonia) ([place])   ? S/P ventriculoperitoneal shunt 2004--- PER PT SHUNT IS WORKING WELL --  NO ISSUES  ? SAH (subarachnoid hemorrhage) ([place])   ? Squamous cell cancer of scalp and skin of neck   ? mole back of head  ? Tubular adenoma of colon   ? ?Past Surgical History:  ?Procedure Laterality Date  ? BILATERAL FRONTAL INTERVENTRICULAR CATHETER VIA TWIST DRILL HOLE  AUG 2004  ? SPONTANEOUS SUBARACHNOID  HEMORRHAGE AND  INTRAVENTRICULAR HEMORRHAGE  ? CARPAL TUNNEL RELEASE  07/26/2011  ? Procedure: CARPAL TUNNEL RELEASE;  Surgeon: Magnus Sinning, MD;  Location: [facility];  Service: Orthopedics;  Laterality: Right;  ? CATARACT EXTRACTION W/ INTRAOCULAR LENS  IMPLANT, BILATERAL  2012  ? BILATERAL  ? CORONARY STENT INTERVENTION N/A 04/08/2020  ? Procedure: CORONARY STENT INTERVENTION;  Surgeon: Burnell Blanks, MD;  Location: [facility];  Service: Cardiovascular;  Laterality: N/A;  ? DUPUYTREN CONTRACTURE RELEASE  07/26/2011  ? Procedure: DUPUYTREN CONTRACTURE RELEASE;  Surgeon: Magnus Sinning, MD;  Location: [facility];  Service: Orthopedics;  Laterality: Right;  EXCISION OF DUPYTRENS BAND THUMB- INDEX WEB SPACE  ? ESOPHAGEAL MANOMETRY N/A 01/27/2019  ? Procedure: ESOPHAGEAL MANOMETRY (EM);  Surgeon: Jerene Bears, MD;  Location: [facility];  Service: Gastroenterology;  Laterality: N/A;  ? EYE SURGERY Bilateral   ?  scrapped corneas  ? INTRAVASCULAR IMAGING/OCT N/A 04/08/2020  ? Procedure: INTRAVASCULAR IMAGING/OCT;  Surgeon: Burnell Blanks, MD;  Location: [facility];  Service: Cardiovascular;  Laterality: N/A;  ? LEFT HEART CATH AND CORONARY ANGIOGRAPHY N/A 04/08/2020  ? Procedure: LEFT HEART CATH AND CORONARY ANGIOGRAPHY;  Surgeon: Burnell Blanks, MD;  Location: [facility];  Service: Cardiovascular;  Laterality: N/A;  ?  LUNG BIOPSY Right 08/07/2017  ? Procedure: Open LUNG BIOPSY;  Surgeon: Ivin Poot, MD;  Location: [facility];  Service: Thoracic;  Laterality: Right;  ? RESECTION OF MEDIASTINAL MASS N/A 08/07/2017  ? Procedure: RESECTION OF MEDIASTINAL MASS;  Surgeon: Ivin Poot, MD;  Location: [facility];  Service: Thoracic;  Laterality: N/A;  ? RETINAL DETACHMENT REPAIR W/ SCLERAL BUCKLE LE  03-10-2011  ? LEFT EYE  ? STERNOTOMY N/A 08/07/2017  ? Procedure: STERNOTOMY;  Surgeon: Ivin Poot, MD;  Location: [facility];  Service: Thoracic;  Laterality: N/A;  ? VENTRICULOPERITONEAL SHUNT  10-08-2002  ? RIGHT FRONTAL FOR COMMUNICATING HYDROCEPHALUS  ? ? ?Allergies ? ?Allergies  ?Allergen Reactions  ? Brilinta [Ticagrelor] Shortness Of Breath  ? Carvedilol Palpitations and Other (See Comments)  ?  Abdominal bloating  ? Claritin [Loratadine] Other (See Comments)  ?  Blurred vision and dry eyes  ? Lipitor [Atorvastatin]   ?  fatigue  ? Lisinopril Other (See Comments)  ?  "drunk feeling"  ? ? ?History of Present Illness  ?  ?Alison Ayala is a 76 y.o. female who presents via audio/video conferencing for a telehealth visit today.  Pt was last seen in cardiology clinic on 07/28/2020 by Dr. Acie Fredrickson.  At that time Alison Ayala was doing well .  The patient is now pending colonoscopy/EGD.  Since her last visit, she  remains stable from a cardiac standpoint ? ?Today she denies chest pain, shortness of breath, lower extremity edema, fatigue, palpitations, melena, hematuria, hemoptysis, diaphoresis, weakness, presyncope, syncope,  and PND. ? ?Home Medications  ?  ?Prior to Admission medications   ?Medication Sig Start Date End Date Taking? Authorizing Provider  ?acetaminophen (TYLENOL) 500 MG tablet Take 1-2 tablets (500-1,000 mg total) by mouth 2 (two) times daily as needed for moderate pain or headache. 08/10/17   Nani Skillern, PA-C  ?aspirin EC 81 MG tablet Take 81 mg by mouth daily. Swallow whole. ?Patient not taking: Reported on  05/05/2021    [provider]  ?carboxymethylcellulose (REFRESH PLUS) 0.5 % SOLN Place 1 drop into both eyes 2 (two) times daily as needed (dry eyes).    [provider]  ?clopidogrel (PLAVIX) 75 MG tablet Take 1 tablet (75 mg total) by mouth daily. 10/25/20   Nahser, Wonda Cheng, MD  ?furosemide (LASIX) 40 MG tablet Take 1 tablet (40 mg total) by mouth daily. 07/05/20   Nita Sells, MD  ?ibuprofen (ADVIL,MOTRIN) 200 MG tablet Take 400-600 mg by mouth 2 (two) times daily as needed for headache or moderate pain.    [provider]  ?metoprolol succinate (TOPROL-XL) 50 MG 24 hr tablet Take 50 mg by mouth daily. Take with or immediately following a meal.    [provider]  ?nitroGLYCERIN (NITROSTAT) 0.4 MG SL tablet DISSOLVE 1 TABLET UNDER THE TONGUE ONCE FOR EPIGASTRIC PAIN/CHEST PAIN NOT RELIEVED BY PEPPERMINT ALTOIDS. IF THIS DOES NOT RESOLVE PAIN, CALL DR! 01/03/21   Jerene Bears, MD  ?ondansetron (ZOFRAN-ODT) 4 MG disintegrating tablet TAKE 1 TABLET BY MOUTH EVERY 8 HOURS  AS NEEDED FOR NAUSEA AND VOMITING 01/03/21   Zehr, Laban Emperor, PA-C  ?PEG-KCl-NaCl-NaSulf-Na Asc-C (PLENVU) 140 g SOLR Take 1 kit by mouth as directed. Use coupon: BIN: 623762 PNC: CNRX Group: [id number] ID: [id number] ?Patient not taking: Reported on 05/05/2021 05/03/21   Jerene Bears, MD  ?potassium chloride SA (KLOR-CON) 20 MEQ tablet Take 2 tablets (40 mEq total) by mouth daily. ?Patient taking differently: Take 20 mEq by mouth daily. PER PT SHE STATES SHE IS TAKING 1 TAB DAILY 07/05/20   Nita Sells, MD  ?rosuvastatin (CRESTOR) 20 MG tablet TAKE 1 TABLET BY MOUTH EVERY DAY 04/21/21   Nahser, Wonda Cheng, MD  ?valsartan (DIOVAN) 320 MG tablet Take 1 tablet (320 mg total) by mouth daily. 05/03/20   Richardson Dopp T, PA-C  ? ? ?Physical Exam  ?  ?Vital Signs:  Alison Ayala does not have vital signs available for review today. ? ?Given telephonic nature of communication, physical exam is  limited. ?AAOx3. NAD. Normal affect.  Speech and respirations are unlabored. ? ?Accessory Clinical Findings  ?  ?None ? ?Assessment & Plan  ?  ?1.  Preoperative Cardiovascular Risk Assessment: ? ?  ? ?Primary Cardio

## 2021-05-10 NOTE — Telephone Encounter (Signed)
I have spoken to patient to advise that per cardiology, she may hold plavix 5 days prior to her procedure. She verbalizes understanding of this information. ?

## 2021-05-10 NOTE — Telephone Encounter (Signed)
I have left a message for patient to call back. 

## 2021-06-03 ENCOUNTER — Encounter: Payer: Self-pay | Admitting: Internal Medicine

## 2021-06-06 ENCOUNTER — Telehealth: Payer: Self-pay | Admitting: *Deleted

## 2021-06-06 NOTE — Telephone Encounter (Signed)
Dr. Hilarie Fredrickson, ? ?This pt is scheduled with you on 5/5.  She is a documented difficult intubation and her procedure will need to be done at the hospital.   ? ?Thanks, ? ?Osvaldo Angst ?

## 2021-06-08 ENCOUNTER — Other Ambulatory Visit: Payer: Self-pay

## 2021-06-08 DIAGNOSIS — R1013 Epigastric pain: Secondary | ICD-10-CM

## 2021-06-08 DIAGNOSIS — K5900 Constipation, unspecified: Secondary | ICD-10-CM

## 2021-06-08 NOTE — Telephone Encounter (Signed)
Left message for pt to call back regarding her procedure needing to be changed. Tried to call cell number and it rings and goes to a fast busy signal. ?

## 2021-06-08 NOTE — Telephone Encounter (Signed)
Please see documentation from anesthesia ?Pt's procedure will need to be moved from [place] to [place] ?

## 2021-06-08 NOTE — Telephone Encounter (Signed)
ECL in the [facility] cancelled. Pt rescheduled at [facility] 06/16/21 at 9:45am. Pt to arrive at [facility] at 8:15am. She is aware of new prep instruction times. Pt aware of appt. ?

## 2021-06-09 ENCOUNTER — Encounter (HOSPITAL_COMMUNITY): Payer: Self-pay | Admitting: Internal Medicine

## 2021-06-10 ENCOUNTER — Encounter: Payer: Medicare HMO | Admitting: Internal Medicine

## 2021-06-15 ENCOUNTER — Ambulatory Visit (INDEPENDENT_AMBULATORY_CARE_PROVIDER_SITE_OTHER)
Admission: RE | Admit: 2021-06-15 | Discharge: 2021-06-15 | Disposition: A | Discharge status: Home / Self Care | Payer: Medicare HMO | Source: Ambulatory Visit | Attending: Primary Care | Admitting: Primary Care

## 2021-06-15 DIAGNOSIS — J849 Interstitial pulmonary disease, unspecified: Secondary | ICD-10-CM

## 2021-06-15 DIAGNOSIS — I2510 Atherosclerotic heart disease of native coronary artery without angina pectoris: Secondary | ICD-10-CM | POA: Diagnosis not present

## 2021-06-15 DIAGNOSIS — J9811 Atelectasis: Secondary | ICD-10-CM | POA: Diagnosis not present

## 2021-06-15 DIAGNOSIS — J479 Bronchiectasis, uncomplicated: Secondary | ICD-10-CM | POA: Diagnosis not present

## 2021-06-15 NOTE — Anesthesia Preprocedure Evaluation (Addendum)
Anesthesia Evaluation  ?Patient identified by MRN, date of birth, ID band ?Patient awake ? ? ? ?Reviewed: ?Allergy & Precautions, NPO status , Patient's Chart, lab work & pertinent test results ? ?Airway ?Mallampati: II ? ?TM Distance: >3 FB ?Neck ROM: Full ? ? ? Dental ?no notable dental hx. ? ?  ?Pulmonary ? ?H/o mediastinal mass s/p open sternotomy ?  ?Pulmonary exam normal ?breath sounds clear to auscultation ? ? ? ? ? ? Cardiovascular ?hypertension, Pt. on medications and Pt. on home beta blockers ?+ angina + CAD, + Cardiac Stents (DES 3/2022on plavix. last dose 06/11/21) and +CHF  ?Normal cardiovascular exam+ Valvular Problems/Murmurs AI and MVP  ?Rhythm:Regular Rate:Normal ? ?1. Left ventricular ejection fraction, by estimation, is 60 to 65%. The left ventricle has normal ?function. The left ventricle has no regional wall motion abnormalities. There is moderate left ?ventricular hypertrophy of the basal-septal segment. Left ventricular diastolic parameters are ?consistent with Grade I diastolic dysfunction (impaired relaxation). ?2. Right ventricular systolic function is normal. The right ventricular size is normal. There is ?mildly elevated pulmonary artery systolic pressure. The estimated right ventricular systolic ?pressure is 73.4 mmHg. ?3. The mitral valve is normal in structure. Mild mitral valve regurgitation. No evidence of mitral ?stenosis. ?4. The aortic valve is normal in structure. There is mild calcification of the aortic valve. There is ?mild thickening of the aortic valve. Aortic valve regurgitation is mild to moderate. Mild to ?moderate aortic valve sclerosis/calcification is present, without any evidence of aortic ?stenosis. Aortic valve mean gradient measures 6.0 mmHg. Aortic valve Vmax measures 1.65 ?m/s. ?5. The inferior vena cava is normal in size with greater than 50% respiratory variability, ?suggesting right atrial pressure of 3 mmHg. ?  ?Neuro/Psych ?VP  shunt ? Neuromuscular disease (carpal tunnel)   ? GI/Hepatic ?Neg liver ROS, hiatal hernia, GERD  ,  ?Endo/Other  ?negative endocrine ROS ? Renal/GU ?negative Renal ROS  ? ?  ?Musculoskeletal ?negative musculoskeletal ROS ?(+)  ? Abdominal ?  ?Peds ? 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 Reproductive/Obstetrics ?negative OB ROS ? ?  ? ? ? ? ? ? ? ? ? ? ? ? ? ?  ?  ? ? ? ? ? ? ?Anesthesia Physical ?Anesthesia Plan ? ?ASA: 3 ? ?Anesthesia Plan: MAC  ? ?Post-op Pain Management:   ? ?Induction: Intravenous ? ?PONV Risk Score and Plan: 2 and Propofol infusion, TIVA and Treatment may vary due to age or medical condition ? ?Airway Management Planned: Natural Airway, Nasal Cannula and Simple Face Mask ? ?Additional Equipment:  ? ?Intra-op Plan:  ? ?Post-operative Plan:  ? ?Informed Consent: I have reviewed the patients History and Physical, chart, labs and discussed the procedure including the risks, benefits and alternatives for the proposed anesthesia with the patient or authorized representative who has indicated his/her understanding and acceptance.  ? ? ? ?Dental advisory given ? ?Plan Discussed with: CRNA, Anesthesiologist and Surgeon ? ?Anesthesia Plan Comments:   ? ? ? ? ? ?Anesthesia Quick Evaluation ? ?

## 2021-06-16 ENCOUNTER — Ambulatory Visit (HOSPITAL_BASED_OUTPATIENT_CLINIC_OR_DEPARTMENT_OTHER): Payer: Medicare HMO | Admitting: Anesthesiology

## 2021-06-16 ENCOUNTER — Encounter (HOSPITAL_COMMUNITY)
Admission: RE | Disposition: A | Discharge status: Home / Self Care | Payer: Self-pay | Source: Ambulatory Visit | Attending: Internal Medicine

## 2021-06-16 ENCOUNTER — Encounter (HOSPITAL_COMMUNITY): Payer: Self-pay | Admitting: Internal Medicine

## 2021-06-16 ENCOUNTER — Ambulatory Visit (HOSPITAL_COMMUNITY): Payer: Medicare HMO | Admitting: Anesthesiology

## 2021-06-16 ENCOUNTER — Other Ambulatory Visit: Payer: Self-pay

## 2021-06-16 ENCOUNTER — Ambulatory Visit (HOSPITAL_COMMUNITY)
Admission: RE | Admit: 2021-06-16 | Discharge: 2021-06-16 | Disposition: A | Discharge status: Home / Self Care | Payer: Medicare HMO | Source: Ambulatory Visit | Attending: Internal Medicine | Admitting: Internal Medicine

## 2021-06-16 DIAGNOSIS — K5790 Diverticulosis of intestine, part unspecified, without perforation or abscess without bleeding: Secondary | ICD-10-CM | POA: Diagnosis not present

## 2021-06-16 DIAGNOSIS — K219 Gastro-esophageal reflux disease without esophagitis: Secondary | ICD-10-CM | POA: Diagnosis not present

## 2021-06-16 DIAGNOSIS — Z860100 Personal history of colon polyps, unspecified: Secondary | ICD-10-CM

## 2021-06-16 DIAGNOSIS — D47Z2 Castleman disease: Secondary | ICD-10-CM | POA: Insufficient documentation

## 2021-06-16 DIAGNOSIS — K5730 Diverticulosis of large intestine without perforation or abscess without bleeding: Secondary | ICD-10-CM | POA: Diagnosis not present

## 2021-06-16 DIAGNOSIS — I25119 Atherosclerotic heart disease of native coronary artery with unspecified angina pectoris: Secondary | ICD-10-CM | POA: Diagnosis not present

## 2021-06-16 DIAGNOSIS — Z7901 Long term (current) use of anticoagulants: Secondary | ICD-10-CM | POA: Diagnosis not present

## 2021-06-16 DIAGNOSIS — R1013 Epigastric pain: Secondary | ICD-10-CM

## 2021-06-16 DIAGNOSIS — I110 Hypertensive heart disease with heart failure: Secondary | ICD-10-CM | POA: Diagnosis not present

## 2021-06-16 DIAGNOSIS — Z800 Family history of malignant neoplasm of digestive organs: Secondary | ICD-10-CM | POA: Diagnosis not present

## 2021-06-16 DIAGNOSIS — K2950 Unspecified chronic gastritis without bleeding: Secondary | ICD-10-CM | POA: Insufficient documentation

## 2021-06-16 DIAGNOSIS — I509 Heart failure, unspecified: Secondary | ICD-10-CM | POA: Insufficient documentation

## 2021-06-16 DIAGNOSIS — K449 Diaphragmatic hernia without obstruction or gangrene: Secondary | ICD-10-CM | POA: Diagnosis not present

## 2021-06-16 DIAGNOSIS — Z1211 Encounter for screening for malignant neoplasm of colon: Secondary | ICD-10-CM | POA: Diagnosis not present

## 2021-06-16 DIAGNOSIS — K222 Esophageal obstruction: Secondary | ICD-10-CM

## 2021-06-16 DIAGNOSIS — K5900 Constipation, unspecified: Secondary | ICD-10-CM

## 2021-06-16 DIAGNOSIS — I2510 Atherosclerotic heart disease of native coronary artery without angina pectoris: Secondary | ICD-10-CM | POA: Diagnosis not present

## 2021-06-16 HISTORY — PX: COLONOSCOPY WITH PROPOFOL: SHX5780

## 2021-06-16 HISTORY — PX: BIOPSY: SHX5522

## 2021-06-16 HISTORY — PX: ESOPHAGOGASTRODUODENOSCOPY: SHX5428

## 2021-06-16 SURGERY — COLONOSCOPY WITH PROPOFOL
Anesthesia: Monitor Anesthesia Care

## 2021-06-16 MED ORDER — LACTATED RINGERS IV SOLN: INTRAVENOUS | Status: DC

## 2021-06-16 MED ORDER — LIDOCAINE 2% (20 MG/ML) 5 ML SYRINGE
INTRAMUSCULAR | Status: DC | PRN
  Administered 2021-06-16: 60 mg via INTRAVENOUS

## 2021-06-16 MED ORDER — SODIUM CHLORIDE 0.9 % IV SOLN: INTRAVENOUS | Status: DC

## 2021-06-16 MED ORDER — PROPOFOL 500 MG/50ML IV EMUL
INTRAVENOUS | Status: DC | PRN
  Administered 2021-06-16: 150 ug/kg/min via INTRAVENOUS

## 2021-06-16 MED ORDER — PROPOFOL 10 MG/ML IV BOLUS
INTRAVENOUS | Status: DC | PRN
  Administered 2021-06-16: 20 mg via INTRAVENOUS
  Administered 2021-06-16: 70 mg via INTRAVENOUS

## 2021-06-16 SURGICAL SUPPLY — 22 items

## 2021-06-16 NOTE — Op Note (Addendum)
[facility] ?Patient Name: Alison Ayala ?Procedure Date: 06/16/2021 ?MRN: [id number] ?Attending MD: Jerene Bears , MD ?Date of Birth: [birth year] ?CSN: [id number] ?Age: 76 ?Admit Type: Outpatient ?Procedure:                Colonoscopy ?Indications:              High risk colon cancer surveillance: Personal  ?                          history of non-advanced adenoma, Last colonoscopy:  ?                          October 2017, family history of colon cancer ?Providers:                Lajuan Lines. Zykee Avakian, MD, Allayne Gitelman, RN, Frazier Richards,  ?                          Technician ?Referring MD:             Harlan Stains ?Medicines:                Monitored Anesthesia Care ?Complications:            No immediate complications. ?Estimated Blood Loss:     Estimated blood loss: none. ?Procedure:                Pre-Anesthesia Assessment: ?                          - Prior to the procedure, a History and Physical  ?                          was performed, and patient medications and  ?                          allergies were reviewed. The patient's tolerance of  ?                          previous anesthesia was also reviewed. The risks  ?                          and benefits of the procedure and the sedation  ?                          options and risks were discussed with the patient.  ?                          All questions were answered, and informed consent  ?                          was obtained. Prior Anticoagulants: The patient has  ?                          taken Plavix (clopidogrel), last dose was 5 days  ?  prior to procedure. ASA Grade Assessment: III - A  ?                          patient with severe systemic disease. After  ?                          reviewing the risks and benefits, the patient was  ?                          deemed in satisfactory condition to undergo the  ?                          procedure. ?                          After obtaining informed consent,  the colonoscope  ?                          was passed under direct vision. Throughout the  ?                          procedure, the patient's blood pressure, pulse, and  ?                          oxygen saturations were monitored continuously. The  ?                          CF-HQ190L ([id number]) Olympus colonoscope was  ?                          introduced through the anus and advanced to the  ?                          cecum, identified by appendiceal orifice and  ?                          ileocecal valve. The colonoscopy was performed  ?                          without difficulty. The patient tolerated the  ?                          procedure well. The quality of the bowel  ?                          preparation was good. The ileocecal valve,  ?                          appendiceal orifice, and rectum were photographed. ?Scope In: 9:46:16 AM ?Scope Out: 10:02:51 AM ?Scope Withdrawal Time: 0 hours 10 minutes 10 seconds  ?Total Procedure Duration: 0 hours 16 minutes 35 seconds  ?Findings: ?     The digital rectal exam was normal. ?     Multiple small-mouthed diverticula were found in the sigmoid colon and  ?  descending colon. ?     The exam was otherwise without abnormality on direct and retroflexion  ?     views. ?Impression:               - Diverticulosis in the sigmoid colon and in the  ?                          descending colon. ?                          - The examination was otherwise normal on direct  ?                          and retroflexion views. ?                          - No specimens collected. ?Moderate Sedation: ?     N/A ?Recommendation:           - Patient has a contact number available for  ?                          emergencies. The signs and symptoms of potential  ?                          delayed complications were discussed with the  ?                          patient. Return to normal activities tomorrow.  ?                          Written discharge instructions were provided  to the  ?                          patient. ?                          - Resume previous diet. ?                          - Continue present medications. ?                          - Proceed with CT scan abd/pelvis with IV contrast  ?                          to evaluate ongoing upper and lower abdominal pain.  ?                          If negative then consider repeat SIBO therapy and  ?                          if unhelpful possible TCA. ?                          - Resume Plavix (clopidogrel) at prior dose today.  ?  Refer to managing physician for further adjustment  ?                          of therapy. ?                          - No repeat colonoscopy due to age and the absence  ?                          of colonic polyps. ?Procedure Code(s):        --- Professional --- ?                          G0105, Colorectal cancer screening; colonoscopy on  ?                          individual at high risk ?Diagnosis Code(s):        --- Professional --- ?                          Z86.010, Personal history of colonic polyps ?                          K57.30, Diverticulosis of large intestine without  ?                          perforation or abscess without bleeding ?CPT copyright 2019 American [employer] Association. All rights reserved. ?The codes documented in this report are preliminary and upon coder review may  ?be revised to meet current compliance requirements. ?Jerene Bears, MD ?06/16/2021 10:24:43 AM ?This report has been signed electronically. ?Number of Addenda: 0 ?

## 2021-06-16 NOTE — Transfer of Care (Signed)
Immediate Anesthesia Transfer of Care Note ? ?Patient: Alison Ayala ? ?Procedure(s) Performed: COLONOSCOPY WITH PROPOFOL ?ESOPHAGOGASTRODUODENOSCOPY (EGD) ?BIOPSY ? ?Patient Location: PACU ? ?Anesthesia Type:MAC ? ?Level of Consciousness: awake, alert , oriented and patient cooperative ? ?Airway & Oxygen Therapy: Patient Spontanous Breathing ? ?Post-op Assessment: Report given to RN and Post -op Vital signs reviewed and stable ? ?Post vital signs: Reviewed and stable ? ?Last Vitals:  ?Vitals Value Taken Time  ?BP    ?Temp    ?Pulse 70 06/16/21 1008  ?Resp 19 06/16/21 1008  ?SpO2 99 % 06/16/21 1008  ?Vitals shown include unvalidated device data. ? ?Last Pain:  ?Vitals:  ? 06/16/21 0853  ?TempSrc: Oral  ?PainSc: 0-No pain  ?   ? ?  ? ?Complications: No notable events documented. ?

## 2021-06-16 NOTE — Op Note (Signed)
[facility] ?Patient Name: Alison Ayala ?Procedure Date: 06/16/2021 ?MRN: [id number] ?Attending MD: Jerene Bears , MD ?Date of Birth: December 26, 1945 ?CSN: [id number] ?Age: 76 ?Admit Type: Outpatient ?Procedure:                Upper GI endoscopy ?Indications:              Lower abdominal pain, Upper abdominal pain,  ?                          previous dx of GERD, HH and Jackhammer esophagus;  ?                          pain rather constant and daily ?Providers:                Lajuan Lines. Jasara Corrigan, MD, Allayne Gitelman, RN, Frazier Richards,  ?                          Technician ?Referring MD:             Harlan Stains ?Medicines:                Propofol per Anesthesia ?Complications:            No immediate complications. ?Estimated Blood Loss:     Estimated blood loss was minimal. ?Procedure:                Pre-Anesthesia Assessment: ?                          - Prior to the procedure, a History and Physical  ?                          was performed, and patient medications and  ?                          allergies were reviewed. The patient's tolerance of  ?                          previous anesthesia was also reviewed. The risks  ?                          and benefits of the procedure and the sedation  ?                          options and risks were discussed with the patient.  ?                          All questions were answered, and informed consent  ?                          was obtained. Prior Anticoagulants: The patient has  ?                          taken Plavix (clopidogrel), last dose was 5 days  ?  prior to procedure. ASA Grade Assessment: III - A  ?                          patient with severe systemic disease. After  ?                          reviewing the risks and benefits, the patient was  ?                          deemed in satisfactory condition to undergo the  ?                          procedure. ?                          After obtaining informed consent, the  endoscope was  ?                          passed under direct vision. Throughout the  ?                          procedure, the patient's blood pressure, pulse, and  ?                          oxygen saturations were monitored continuously. The  ?                          [id number]-[id number] ([id number]) Olympus endoscope was introduced  ?                          through the mouth, and advanced to the second part  ?                          of duodenum. The upper GI endoscopy was  ?                          accomplished without difficulty. The patient  ?                          tolerated the procedure well. ?Scope In: ?Scope Out: ?Findings: ?     A widely patent Schatzki ring was found at the gastroesophageal junction. ?     A 3 cm hiatal hernia was present. ?     The gastroesophageal flap valve was visualized endoscopically and  ?     classified as Hill Grade IV (no fold, wide open lumen, hiatal hernia  ?     present). ?     The entire examined stomach was normal. Biopsies were taken with a cold  ?     forceps for histology and Helicobacter pylori testing. ?     The examined duodenum was normal. Biopsies were taken with a cold  ?     forceps for histology. ?Impression:               - Widely patent Schatzki ring at GEJ. ?                          -  3 cm hiatal hernia. ?                          - Normal stomach. Biopsied. ?                          - Normal examined duodenum. Biopsied. ?Moderate Sedation: ?     N/A ?Recommendation:           - Patient has a contact number available for  ?                          emergencies. The signs and symptoms of potential  ?                          delayed complications were discussed with the  ?                          patient. Return to normal activities tomorrow.  ?                          Written discharge instructions were provided to the  ?                          patient. ?                          - Resume previous diet. ?                          - Continue present  medications. ?                          - Await pathology results. ?                          - No immediate cause of upper and lower abdominal  ?                          pain. ?                          - See the other procedure note for documentation of  ?                          additional recommendations. ?Procedure Code(s):        --- Professional --- ?                          [phone], Esophagogastroduodenoscopy, flexible,  ?                          transoral; with biopsy, single or multiple ?Diagnosis Code(s):        --- Professional --- ?                          K22.2, Esophageal obstruction ?  K44.9, Diaphragmatic hernia without obstruction or  ?                          gangrene ?                          R10.30, Lower abdominal pain, unspecified ?                          R10.10, Upper abdominal pain, unspecified ?CPT copyright 2019 American Medical Association. All rights reserved. ?The codes documented in this report are preliminary and upon coder review may  ?be revised to meet current compliance requirements. ?Jerene Bears, MD ?06/16/2021 10:11:19 AM ?This report has been signed electronically. ?Number of Addenda: 0 ?

## 2021-06-16 NOTE — Anesthesia Postprocedure Evaluation (Signed)
Anesthesia Post Note ? ?Patient: Alison Ayala ? ?Procedure(s) Performed: COLONOSCOPY WITH PROPOFOL ?ESOPHAGOGASTRODUODENOSCOPY (EGD) ?BIOPSY ? ?  ? ?Patient location during evaluation: Endoscopy ?Anesthesia Type: MAC ?Level of consciousness: awake and alert ?Pain management: pain level controlled ?Vital Signs Assessment: post-procedure vital signs reviewed and stable ?Respiratory status: spontaneous breathing, nonlabored ventilation and respiratory function stable ?Cardiovascular status: blood pressure returned to baseline and stable ?Postop Assessment: no apparent nausea or vomiting ?Anesthetic complications: no ? ? ?No notable events documented. ? ?Last Vitals:  ?Vitals:  ? 06/16/21 1025 06/16/21 1030  ?BP:  (!) 148/68  ?Pulse: 62 60  ?Resp: 19 20  ?Temp:    ?SpO2: 97% 99%  ?  ?Last Pain:  ?Vitals:  ? 06/16/21 1009  ?TempSrc: Temporal  ?PainSc: 0-No pain  ? ? ?  ?  ?  ?  ?  ?  ? ?Merlinda Frederick ? ? ? ? ?

## 2021-06-16 NOTE — H&P (Signed)
?  ? ?GASTROENTEROLOGY PROCEDURE H&P NOTE  ? ?Primary Care Physician: ?Harlan Stains, MD ? ? ? ?Reason for Procedure:  Epigastric pain, history of GERD and jackhammer esophagus, history of colon polyps ? ?Plan:    EGD and colonoscopy ? ?Patient is appropriate for endoscopic procedure(s) in the outpatient hospital setting. ? ?The nature of the procedure, as well as the risks, benefits, and alternatives were carefully and thoroughly reviewed with the patient. Ample time for discussion and questions allowed. The patient understood, was satisfied, and agreed to proceed.  ? ? ? ?HPI: ?Alison Ayala is a 76 y.o. female who presents for EGD and colonoscopy.  Medical history as below.  Tolerated the prep.  No recent chest pain or shortness of breath.  No abdominal pain today. ? ?Plavix has been on hold x5 days as requested. ? ?Past Medical History:  ?Diagnosis Date  ? CAD (coronary artery disease)   ? a. s/p DES to LAD 04/2020.  ? Carpal tunnel syndrome of right wrist   ? 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 Diverticulosis   ? GERD (gastroesophageal reflux disease)   ? H/O hiatal hernia   ? H/O intraventricular hemorrhage 2004--  SPONTANEOUS  ? S/P SHUNT  ? Heart murmur   ? Hiatal hernia   ? Hip pain, right OCCASIONAL -- PINCHED NERVE  ? History of gastric ulcer 2001  ? Hyperlipidemia   ? Hypertension   ? Hypoglycemia   ? Internal hemorrhoids   ? Interstitial lung disease ([place])   ? Jackhammer esophagus   ? Mediastinal mass   ? Mitral valve prolapse   ? Normal echocardiogram 30 YRS AGO  ? NSIP (nonspecific interstitial pneumonia) ([place])   ? S/P ventriculoperitoneal shunt 2004--- PER PT SHUNT IS WORKING WELL --  NO ISSUES  ? SAH (subarachnoid hemorrhage) ([place])   ? Squamous cell cancer of scalp and skin of neck   ? mole back of head  ? Tubular adenoma of colon   ? ? ?Past Surgical History:  ?Procedure Laterality Date  ? BILATERAL FRONTAL INTERVENTRICULAR CATHETER VIA TWIST DRILL HOLE  AUG 2004  ? SPONTANEOUS SUBARACHNOID   HEMORRHAGE AND  INTRAVENTRICULAR HEMORRHAGE  ? CARPAL TUNNEL RELEASE  07/26/2011  ? Procedure: CARPAL TUNNEL RELEASE;  Surgeon: Magnus Sinning, MD;  Location: [facility];  Service: Orthopedics;  Laterality: Right;  ? CATARACT EXTRACTION W/ INTRAOCULAR LENS  IMPLANT, BILATERAL  2012  ? BILATERAL  ? CORONARY STENT INTERVENTION N/A 04/08/2020  ? Procedure: CORONARY STENT INTERVENTION;  Surgeon: Burnell Blanks, MD;  Location: [facility];  Service: Cardiovascular;  Laterality: N/A;  ? DUPUYTREN CONTRACTURE RELEASE  07/26/2011  ? Procedure: DUPUYTREN CONTRACTURE RELEASE;  Surgeon: Magnus Sinning, MD;  Location: [place];  Service: Orthopedics;  Laterality: Right;  EXCISION OF DUPYTRENS BAND THUMB- INDEX WEB SPACE  ? ESOPHAGEAL MANOMETRY N/A 01/27/2019  ? Procedure: ESOPHAGEAL MANOMETRY (EM);  Surgeon: Jerene Bears, MD;  Location: [facility];  Service: Gastroenterology;  Laterality: N/A;  ? EYE SURGERY Bilateral   ? scrapped corneas  ? INTRAVASCULAR IMAGING/OCT N/A 04/08/2020  ? Procedure: INTRAVASCULAR IMAGING/OCT;  Surgeon: Burnell Blanks, MD;  Location: [facility];  Service: Cardiovascular;  Laterality: N/A;  ? LEFT HEART CATH AND CORONARY ANGIOGRAPHY N/A 04/08/2020  ? Procedure: LEFT HEART CATH AND CORONARY ANGIOGRAPHY;  Surgeon: Burnell Blanks, MD;  Location: [facility];  Service: Cardiovascular;  Laterality: N/A;  ? LUNG BIOPSY Right 08/07/2017  ?  Procedure: Open LUNG BIOPSY;  Surgeon: Ivin Poot, MD;  Location: [place];  Service: Thoracic;  Laterality: Right;  ? RESECTION OF MEDIASTINAL MASS N/A 08/07/2017  ? Procedure: RESECTION OF MEDIASTINAL MASS;  Surgeon: Ivin Poot, MD;  Location: [place];  Service: Thoracic;  Laterality: N/A;  ? RETINAL DETACHMENT REPAIR W/ SCLERAL BUCKLE LE  03-10-2011  ? LEFT EYE  ? STERNOTOMY N/A 08/07/2017  ? Procedure: STERNOTOMY;  Surgeon: Ivin Poot, MD;  Location: [place];  Service: Thoracic;   Laterality: N/A;  ? VENTRICULOPERITONEAL SHUNT  10-08-2002  ? RIGHT FRONTAL FOR COMMUNICATING HYDROCEPHALUS  ? ? ?Prior to Admission medications   ?Medication Sig Start Date End Date Taking? Authorizing Provider  ?acetaminophen (TYLENOL) 500 MG tablet Take 1-2 tablets (500-1,000 mg total) by mouth 2 (two) times daily as needed for moderate pain or headache. 08/10/17  Yes Lars Pinks M, PA-C  ?carboxymethylcellulose (REFRESH PLUS) 0.5 % SOLN Place 1 drop into both eyes 2 (two) times daily as needed (dry eyes).   Yes [provider]  ?cholecalciferol (VITAMIN D3) 25 MCG (1000 UNIT) tablet Take 1,000 Units by mouth daily.   Yes [provider]  ?clopidogrel (PLAVIX) 75 MG tablet Take 1 tablet (75 mg total) by mouth daily. 10/25/20  Yes Nahser, Wonda Cheng, MD  ?furosemide (LASIX) 40 MG tablet Take 1 tablet (40 mg total) by mouth daily. 07/05/20  Yes Nita Sells, MD  ?metoprolol succinate (TOPROL-XL) 50 MG 24 hr tablet Take 50 mg by mouth daily. Take with or immediately following a meal.   Yes [provider]  ?nitroGLYCERIN (NITROSTAT) 0.4 MG SL tablet DISSOLVE 1 TABLET UNDER THE TONGUE ONCE FOR EPIGASTRIC PAIN/CHEST PAIN NOT RELIEVED BY PEPPERMINT ALTOIDS. IF THIS DOES NOT RESOLVE PAIN, CALL DR! 01/03/21  Yes Meztli Llanas, Lajuan Lines, MD  ?ondansetron (ZOFRAN-ODT) 4 MG disintegrating tablet TAKE 1 TABLET BY MOUTH EVERY 8 HOURS AS NEEDED FOR NAUSEA AND VOMITING 01/03/21  Yes Zehr, Laban Emperor, PA-C  ?PEG-KCl-NaCl-NaSulf-Na Asc-C (PLENVU) 140 g SOLR Take 1 kit by mouth as directed. Use coupon: BIN: [id number] PNC: CNRX Group: [id number] ID: [id number] 05/03/21  Yes Keil Pickering, Lajuan Lines, MD  ?potassium chloride SA (KLOR-CON) 20 MEQ tablet Take 2 tablets (40 mEq total) by mouth daily. ?Patient taking differently: Take 20 mEq by mouth daily. PER PT SHE STATES SHE IS TAKING 1 TAB DAILY 07/05/20  Yes Nita Sells, MD  ?rosuvastatin (CRESTOR) 20 MG tablet TAKE 1 TABLET BY MOUTH EVERY DAY 04/21/21  Yes  Nahser, Wonda Cheng, MD  ?valsartan (DIOVAN) 320 MG tablet Take 1 tablet (320 mg total) by mouth daily. 05/03/20  Yes Richardson Dopp T, PA-C  ? ? ?Current Facility-Administered Medications  ?Medication Dose Route Frequency Provider Last Rate Last Admin  ? 0.9 %  sodium chloride infusion   Intravenous Continuous Alyssamae Klinck, Lajuan Lines, MD      ? lactated ringers infusion   Intravenous Continuous Calandria Mullings, Lajuan Lines, MD 10 mL/hr at 06/16/21 0903 New Bag at 06/16/21 0903  ? ? ?Allergies as of 06/08/2021 - Review Complete 05/06/2021  ?Allergen Reaction Noted  ? Brilinta [ticagrelor] Shortness Of Breath 10/25/2020  ? Carvedilol Palpitations and Other (See Comments) 07/03/2017  ? Claritin [loratadine] Other (See Comments) 07/03/2017  ? Lipitor [atorvastatin]  07/03/2017  ? Lisinopril Other (See Comments) 07/03/2017  ? ? ?Family History  ?Problem Relation Age of Onset  ? Colon cancer Mother 2  ? Colon polyps Mother   ? Stomach cancer Maternal Aunt   ?  Breast cancer Maternal Aunt   ? Uterine cancer Maternal Aunt   ? Rectal cancer Neg Hx   ? Esophageal cancer Neg Hx   ? ? ?Social History  ? ?Socioeconomic History  ? Marital status: Married  ?  Spouse name: Timmothy Sours  ? Number of children: 0  ? Years of education: Not on file  ? Highest education level: Not on file  ?Occupational History  ? Occupation: retired  ?Tobacco Use  ? Smoking status: Never  ? Smokeless tobacco: Never  ?Vaping Use  ? Vaping Use: Never used  ?Substance and Sexual Activity  ? Alcohol use: Yes  ?  Alcohol/week: 2.0 standard drinks  ?  Types: 2 Glasses of wine per week  ?  Comment: OCCASIONAL  ? Drug use: No  ? Sexual activity: Not on file  ?Other Topics Concern  ? Not on file  ?Social History Narrative  ? Not on file  ? ?Social Determinants of Health  ? ?Financial Resource Strain: Not on file  ?Food Insecurity: Not on file  ?Transportation Needs: Not on file  ?Physical Activity: Not on file  ?Stress: Not on file  ?Social Connections: Not on file  ?Intimate Partner Violence: Not  on file  ? ? ?Physical Exam: ?Vital signs in last 24 hours: ?_0  (!) 188/65   Pulse 87   Temp 97.6 ?F (36.4 ?C) (Oral)   Resp 20   Ht 5' 3.5" (1.613 m)   Wt 102.1 kg   SpO2 100%   BMI 39.23 kg/m?  ?GEN: NAD ?EYE: S

## 2021-06-20 LAB — SURGICAL PATHOLOGY

## 2021-06-23 ENCOUNTER — Other Ambulatory Visit: Payer: Self-pay

## 2021-06-23 DIAGNOSIS — G8929 Other chronic pain: Secondary | ICD-10-CM

## 2021-06-23 DIAGNOSIS — R1013 Epigastric pain: Secondary | ICD-10-CM

## 2021-06-23 NOTE — Telephone Encounter (Signed)
See result note.  

## 2021-06-23 NOTE — Telephone Encounter (Signed)
Patient is returning your call regarding results °

## 2021-06-27 DIAGNOSIS — H04123 Dry eye syndrome of bilateral lacrimal glands: Secondary | ICD-10-CM | POA: Diagnosis not present

## 2021-07-01 ENCOUNTER — Ambulatory Visit (HOSPITAL_COMMUNITY)
Admission: RE | Admit: 2021-07-01 | Discharge: 2021-07-01 | Disposition: A | Discharge status: Home / Self Care | Payer: Medicare HMO | Source: Ambulatory Visit | Attending: Internal Medicine | Admitting: Internal Medicine

## 2021-07-01 DIAGNOSIS — R1013 Epigastric pain: Secondary | ICD-10-CM | POA: Insufficient documentation

## 2021-07-01 DIAGNOSIS — G8929 Other chronic pain: Secondary | ICD-10-CM | POA: Insufficient documentation

## 2021-07-01 DIAGNOSIS — R1031 Right lower quadrant pain: Secondary | ICD-10-CM | POA: Diagnosis not present

## 2021-07-01 DIAGNOSIS — K769 Liver disease, unspecified: Secondary | ICD-10-CM | POA: Diagnosis not present

## 2021-07-01 DIAGNOSIS — N289 Disorder of kidney and ureter, unspecified: Secondary | ICD-10-CM | POA: Diagnosis not present

## 2021-07-01 DIAGNOSIS — D3502 Benign neoplasm of left adrenal gland: Secondary | ICD-10-CM | POA: Diagnosis not present

## 2021-07-01 LAB — POCT I-STAT CREATININE: Creatinine, Ser: 1.2 mg/dL — ABNORMAL HIGH (ref 0.44–1.00)

## 2021-07-01 MED ORDER — SODIUM CHLORIDE (PF) 0.9 % IJ SOLN
INTRAMUSCULAR | Status: AC
  Filled 2021-07-01: qty 50

## 2021-07-01 MED ORDER — IOHEXOL 300 MG/ML  SOLN
100.0000 mL | Freq: Once | INTRAMUSCULAR | Status: AC | PRN
  Administered 2021-07-01: 80 mL via INTRAVENOUS

## 2021-07-05 DIAGNOSIS — R69 Illness, unspecified: Secondary | ICD-10-CM | POA: Diagnosis not present

## 2021-07-07 ENCOUNTER — Telehealth: Payer: Self-pay | Admitting: Internal Medicine

## 2021-07-07 ENCOUNTER — Other Ambulatory Visit: Payer: Self-pay | Admitting: Gastroenterology

## 2021-07-07 NOTE — Telephone Encounter (Signed)
Good Morning Dr. Hilarie Fredrickson,   Patient called stating that she had missed call from you wanting to discuss the results from 5/26. Patient stated she was sorry for missing your call, and if by chance you could call her back anytime next week she would be available.

## 2021-07-12 NOTE — Telephone Encounter (Signed)
I connected with the patient by phone, see result note attached to a recent CT scan of the abdomen and pelvis

## 2021-07-12 NOTE — Progress Notes (Signed)
CT chest from May looked fine. Previous areas of inflammation/infection seen in CT imaging in May 2022 have since resolved.  very mild NSIP pattern fibrosis. No evidence of progression. She should have an annual follow-up with her regular pulmonary MD. Please schedule if she does not   Incidental non-urgent findings moderate hiatal hernia, follow up with GI. Coronary artery calcifications, follow up with cardiology

## 2021-07-13 ENCOUNTER — Telehealth: Payer: Self-pay | Admitting: Internal Medicine

## 2021-07-13 ENCOUNTER — Other Ambulatory Visit: Payer: Self-pay

## 2021-07-13 MED ORDER — AMITRIPTYLINE HCL 25 MG PO TABS: 25.0000 mg | ORAL_TABLET | Freq: Every day | ORAL | 3 refills | Status: DC

## 2021-07-13 NOTE — Telephone Encounter (Signed)
Patient spoke with Dr. Hilarie Fredrickson yesterday who suggested she try low-dose amitriptyline.  She went to her pharmacy to pick it up today, but they are saying they do not have a prescription for it.  Please advise.  Thank you.

## 2021-07-13 NOTE — Telephone Encounter (Signed)
I called [facility] and they are working on her Rx now. I informed Patty and she said thank you.

## 2021-07-15 ENCOUNTER — Other Ambulatory Visit: Payer: Self-pay | Admitting: Cardiovascular Disease

## 2021-07-18 DIAGNOSIS — I700 Atherosclerosis of aorta: Secondary | ICD-10-CM | POA: Diagnosis not present

## 2021-07-18 DIAGNOSIS — J849 Interstitial pulmonary disease, unspecified: Secondary | ICD-10-CM | POA: Diagnosis not present

## 2021-07-18 DIAGNOSIS — I2510 Atherosclerotic heart disease of native coronary artery without angina pectoris: Secondary | ICD-10-CM | POA: Diagnosis not present

## 2021-07-18 DIAGNOSIS — I10 Essential (primary) hypertension: Secondary | ICD-10-CM | POA: Diagnosis not present

## 2021-07-18 DIAGNOSIS — I5032 Chronic diastolic (congestive) heart failure: Secondary | ICD-10-CM | POA: Diagnosis not present

## 2021-07-18 DIAGNOSIS — E785 Hyperlipidemia, unspecified: Secondary | ICD-10-CM | POA: Diagnosis not present

## 2021-07-18 DIAGNOSIS — R7303 Prediabetes: Secondary | ICD-10-CM | POA: Diagnosis not present

## 2021-07-18 DIAGNOSIS — D47Z2 Castleman disease: Secondary | ICD-10-CM | POA: Diagnosis not present

## 2021-07-18 DIAGNOSIS — F3341 Major depressive disorder, recurrent, in partial remission: Secondary | ICD-10-CM | POA: Diagnosis not present

## 2021-07-28 ENCOUNTER — Telehealth: Payer: Self-pay | Admitting: Cardiovascular Disease

## 2021-07-28 NOTE — Telephone Encounter (Signed)
Spoke with patient who reports she has been having SOB with exertion for the past 6-8 months. She states she also has epigastric pain that has moved up into the chest in the last week and a half.  Patient reports she has palpitations with exertion. She states symptoms are alleviated with rest. As a result, patient states she has limited her activity for the last 6 months. She voiced concern that she may need another stent placed.  Patient reports she had a colonscopy and endoscopy performed on 06/16/21 and states these did not show any concerns. Patient continues to have stomach pain she also describes as pressure, "in the upper stomach, under my boobs."  Patient has an appointment with Dr. Acie Fredrickson on 08/01/21.

## 2021-07-28 NOTE — Telephone Encounter (Signed)
STAT if patient feels like he/she is going to faint   Are you dizzy now? no  Do you feel faint or have you passed out? no  Do you have any other symptoms? Fatigue, sweating, SOB, palpitations, stomach pain   Have you checked your HR and BP (record if available)? 130/60 at her PCP last week   Pt c/o Shortness Of Breath: STAT if SOB developed within the last 24 hours or pt is noticeably SOB on the phone  1. Are you currently SOB (can you hear that pt is SOB on the phone)?   2. How long have you been experiencing SOB? Since the beginning of the year   3. Are you SOB when sitting or when up moving around? Up and moving around   4. Are you currently experiencing any other symptoms? See above   Patient c/o Palpitations:  High priority if patient c/o lightheadedness, shortness of breath, or chest pain  How long have you had palpitations/irregular HR/ Afib? Since the beginning of the year    Are you having the symptoms now? No. Patient feels fine  Are you currently experiencing lightheadedness, SOB or CP? no  Do you have a history of afib (atrial fibrillation) or irregular heart rhythm? No history of Afib.   Have you checked your BP or HR? (document readings if available):   Are you experiencing any other symptoms? See above   Patient is scheduled to see Dr. Acie Fredrickson 08/01/21

## 2021-07-28 NOTE — Telephone Encounter (Signed)
Called and spoke to patient to see if she has used any NTG with this chest pressure that she's experiencing. She states that she did take one dose yesterday and the day before and attests that it helped "a little bit." Patient denies recent dietary changes. Denies the pain/pressure sensation is felt anywhere else other than her upper stomach/below breast. No radiation to shoulder blades, jaw, or arm. Condones episodes of sweating that are intermittent-she attributed it to her blood sugar and can not verify if it's been in association with the chest pressure. She has intermittent nausea, none that she feels is progressing. Reiterated to patient the need to go to ED if she feels the episodes are worsening or not being relieved by nitroglycerin. Patient understands.

## 2021-07-31 ENCOUNTER — Encounter: Payer: Self-pay | Admitting: Cardiovascular Disease

## 2021-08-01 ENCOUNTER — Encounter: Payer: Self-pay | Admitting: Cardiovascular Disease

## 2021-08-01 ENCOUNTER — Ambulatory Visit: Payer: Medicare HMO | Admitting: Cardiovascular Disease

## 2021-08-01 VITALS — BP 134/70 | HR 101 | Ht 63.5 in | Wt 222.0 lb

## 2021-08-01 DIAGNOSIS — I5032 Chronic diastolic (congestive) heart failure: Secondary | ICD-10-CM | POA: Diagnosis not present

## 2021-08-01 DIAGNOSIS — I479 Paroxysmal tachycardia, unspecified: Secondary | ICD-10-CM | POA: Diagnosis not present

## 2021-08-01 DIAGNOSIS — I5033 Acute on chronic diastolic (congestive) heart failure: Secondary | ICD-10-CM | POA: Diagnosis not present

## 2021-08-01 DIAGNOSIS — I10 Essential (primary) hypertension: Secondary | ICD-10-CM | POA: Diagnosis not present

## 2021-08-01 DIAGNOSIS — I2510 Atherosclerotic heart disease of native coronary artery without angina pectoris: Secondary | ICD-10-CM | POA: Diagnosis not present

## 2021-08-01 DIAGNOSIS — E785 Hyperlipidemia, unspecified: Secondary | ICD-10-CM | POA: Diagnosis not present

## 2021-08-01 DIAGNOSIS — I951 Orthostatic hypotension: Secondary | ICD-10-CM

## 2021-08-01 DIAGNOSIS — K219 Gastro-esophageal reflux disease without esophagitis: Secondary | ICD-10-CM | POA: Diagnosis not present

## 2021-08-01 MED ORDER — FUROSEMIDE 40 MG PO TABS: ORAL_TABLET | ORAL | 3 refills | Status: DC

## 2021-08-01 MED ORDER — POTASSIUM CHLORIDE CRYS ER 20 MEQ PO TBCR: EXTENDED_RELEASE_TABLET | ORAL | 3 refills | Status: DC

## 2021-08-01 MED ORDER — METOPROLOL SUCCINATE ER 100 MG PO TB24: 100.0000 mg | ORAL_TABLET | Freq: Every day | ORAL | 3 refills | Status: AC

## 2021-08-06 ENCOUNTER — Other Ambulatory Visit: Payer: Self-pay | Admitting: Internal Medicine

## 2021-08-25 ENCOUNTER — Ambulatory Visit: Payer: Medicare HMO | Admitting: Internal Medicine

## 2021-08-25 ENCOUNTER — Encounter: Payer: Self-pay | Admitting: Internal Medicine

## 2021-08-25 VITALS — BP 146/64 | HR 95 | Temp 98.4°F | Ht 63.0 in | Wt 228.0 lb

## 2021-08-25 DIAGNOSIS — J849 Interstitial pulmonary disease, unspecified: Secondary | ICD-10-CM | POA: Diagnosis not present

## 2021-08-25 DIAGNOSIS — J8489 Other specified interstitial pulmonary diseases: Secondary | ICD-10-CM

## 2021-08-25 NOTE — Patient Instructions (Addendum)
ICD-10-CM   1. NSIP (nonspecific interstitial pneumonia) ([place]) J84.89   2. ILD (interstitial lung disease) ([place]) J84.9   3. 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 COntinued stability expected thought there is some low risk for progression  Plan - continue to monitor off prednisone  -  6 months do spirometry and dlco  - hold off breathing test at followup - can decide clinically - if lungs ever get worse, a drug called Ofev +/- clinical trials is an option - lose weight - talk to PCP Harlan Stains, MD if medicines are an options - flu shot and RSV Vaccine in the fall  Followup 6 months but after  PFT; symptoms score and walk test at followup

## 2021-08-25 NOTE — Progress Notes (Signed)
IOV 07/03/2017  Chief Complaint  Patient presents with   Pulm Consult    Referred by Dr. Harlan Stains for possible ILD. Per patient, she does have some occasional SOB and chest tightness.    Alison Ayala 76 y.o. female Alison  Ayala [id number] -referred for evaluation of potential interstitial lung disease.  According to the patient on routine exam it was felt that the sternal head of the clavicles was a little bit prominent so therefore a chest x-ray was done by the primary care physician which was not revealing.  Subsequently patient underwent CT scan of the chest documented below that I personally visualized and agree with the findings.  The 2 significant findings are anterior mediastinal mass with partial calcification and also possible interstitial lung disease and therefore she has been referred here.  She is denying any acute complaints . SHe has appt with Dr Nils Pyle for anterior mediastinal mass  We performed the pulmonary integrated interstitial lung disease questionnaire and for the symptoms she denied any cough but she did admit to shortness of breath that started insidiously approximately 2 years ago and for the last 6 months is gradually gotten worse.  It is not episodic.  It is present on exertion and relieved by rest.  Today when we walked her she did drop her pulse ox greater than 3 points but did not go below 88% her heart rate did respond but it did not go over 90/min.  On a level 5 scale she rates level 3 dyspnea for picking up and straightening, sweeping and vacuuming walking up a hill and walking upstairs.  She rates a level 2 dyspnea for walking on a level ground at her own pace and walking with appears, making the bed, shopping and doing laundry.  She gives a level 1 dyspnea for standing up and dressing but otherwise all other activities a level 0.  Review of systems: She has chronic fatigue and arthralgia.  She also has dysphagia for the last  few to several years [4-5 years].  She has had dry eyes for the last few decades and dry mouth for the last 1 year.  She admits to acid reflux disease for the last 40 years and on and off cold sores in her mouth.  There is no recurrent fever or weight loss or Raynaud  Past medical history: Positive for acid reflux for several decades but negative for asthma, COPD, heart failure, rheumatoid arthritis, other collagen vascular disease, sleep apnea, immune system disorder, pulmonary hypertension, thyroid disease, stroke, seizures, hepatitis, tuberculosis, kidney disease, pneumonia or blood clots, heart disease pleurisy  Family history of lung disease: Denies any pulmonary fibrosis or any other lung disease  Exposure history: Denies any smoking, drugs of abuse.  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  In particular she denies any occupation that might predispose her to hypersensitivity pneumonitis or inorganic dust related interstitial lung disease.  Pulmonary toxicity history: Extensive drug list causing pulmonary toxicity reviewed.  And negative for all of that     CT chest 06/19/17 = personally visualized the CT chest and agree with the findings of large anterior mediastinal mass and possible ILD  - IMPRESSION: 1. No focal soft tissue mass or other abnormality to account for the perceived  palpable abnormality in the suprasternal region. 2. However, there is a large anterior mediastinal mass which demonstrates heterogeneous enhancement and calcification, with several smaller adjacent lesions. These findings are concerning for potential neoplasm, with primary differential considerations including germ cell neoplasm or lesion of thymic origin. On PET scan 07/16/17 - 4.6 cm anterior mediastinal mass with dense central calcifications is  hypermetabolic. SUV max is 6.55. Findings highly suspicious for a thymic neoplasmThoracic surgical consultation is strongly recommended. 3. 1.7 x 2.0 cm left adrenal nodule is indeterminate. Further characterization with nonemergent adrenal protocol CT scan is recommended in the near future to exclude the possibility of a metastatic lesion. 4. Unusual appearance of the lung parenchyma which is concerning for potential interstitial lung disease. Outpatient referral to pulmonology for further evaluation is suggested, as well as a follow-up high-resolution chest CT in 6-12 months to assess for temporal changes in the appearance of the lung parenchyma. 5. Multiple healing left-sided rib fractures, as above. These are all nondisplaced or minimally displaced. No associated pneumothorax noted at this time.     Electronically Signed   By: Vinnie Langton M.D.   On: 06/20/2017 09:20   Results for Alison, Ayala (MRN [id number]) as of 07/03/2017 13:34  Ref. Range 07/26/2011 08:59  Hemoglobin Latest Ref Range: 12.0 - 15.0 g/dL 20.7 (H)    OV 08/23/2017  Chief Complaint  Patient presents with   Follow-up    Pt was hospitalized 7/2-7/7 due to having mediastinal mass resection and a lung biopsy was also performed. HRCT performed 6/3, PFT performed 6/5, and PET scan performed 6/10.  Pt states overall she has been doing good but is anxious to know about results from biopsy.    Follow-up mediastinal massAnteriorand PET hot Follow-up interstitial lung disease  She has now been discharged from post biopsy. She is doing well. She feels baseline. Shortness of breath with exertion is the same and it is mild. Walking desaturation test is documented below is unchanged. A biopsy date was 08/07/2017. The anterior mediastinal mass shows Castleman's disease. Based on PET scan I suspect this is unicentric. She has follow-up with thoracic service is pending. She also had open lung biopsy for early  interstitial lung disease. The formal report is still pending but I discussed with pathologist Dr. Vic Ripper who feels that this is very early and mild interstitial lung disease without any specific pattern but suspects this is early NSIP. There is no granuloma.     OV 11/26/2017  Subjective:  Patient ID: Alison Ayala, female , DOB: Mar 18, 1945 , age 22 y.o. , MRN: [id number] , ADDRESS: [address]. [place] [address]   11/26/2017 -   Chief Complaint  Patient presents with   Follow-up    Doing well feeling the same as last visit.    Follow-up mediastinal massAnteriorand PET hot = Castleman's disease, you are eccentric based on PET scan, July 2019 Follow-up interstitial lung disease -likely NSIP, July 2019   HPI Alison Ayala 76 y.o. -at this point in time she returns for follow-up for interstitial lung disease.  Since her last visit in July 2019 we had a multidisciplinary case conference in August 2019.  Despite the air trapping seen on CT scan of the chest her lung biopsy has been read out as an SIP.  There was a tendency for some multinucleated giant cells but no clear-cut granuloma.  These are few and far between.  I did a history retake for hypersensitivity pneumonitis.  She vehemently  denies any mold or mildew exposure.  She does not use any wind instruments.  She does not do any farming or guarding.  No exposure to rotten wood on mulch.  No pet hamsters or gerbils or birds.  No using feather pillows.  No misty fountain in the house.  No nebulizer use no CPAP use.  No humidifier use.  The house is only 76 years old.  Even in her prior jobs there is no mold exposure.  She is currently exercising.  Her walking desaturation test seems to have improved.    OV 02/26/2018  Subjective:  Patient ID: Alison Ayala, female , DOB: Feb 23, 1945 , age 76 y.o. , MRN: [id number] , ADDRESS: [place]. [place] [address]   Follow-up mediastinal mass Anteriorand  PET hot = Castleman's disease,  based on PET scan, July 2019 - biopsy provine Follow-up interstitial lung disease -likely early/mind NSIP, July 2019 - on surgical lung biopsy  02/26/2018 -   Chief Complaint  Patient presents with   Follow-up    Pt states she has been doing well since last visit. States she still becomes SOB with exertion.     HPI Alison Ayala 76 y.o. -seen October 2019 for the above issues.  She is not taking any prednisone.  She does not want to do it because of side effects.  In the interim she is continued to do well.  She has mild class II dyspnea on exertion relieved by rest.  This is stable.  No new interim issues you are up-to-date with a flu shot.  She had pulmonary function test that shows improvement in FVC with slight decline in DLCO/stable DLCO.  But overall she feels good.  Walking desaturation test shows no change.      Results for DIA, DONATE (MRN [id number]) as of 02/26/2018 15:32  Ref. Range 07/11/2017 09:49 02/26/2018 13:48  FVC-Pre Latest Units: L 2.14 2.55  FVC-%Pred-Pre Latest Units: % 73 88   Results for LAMA, NARAYANAN (MRN [id number]) as of 02/26/2018 15:32  Ref. Range 07/11/2017 09:49 02/26/2018 13:48  DLCO unc Latest Units: ml/min/mmHg 19.55 18.35  DLCO unc % pred Latest Units: % 80 75    07/27/2020- Interim hx  Patient was contacted today to review PFT results. Her breath test showed normal spirometry and DLCO. No evidence of worsening pulmonary fibrosis. She has been checking her oxygen at home and her readings are consistently >90%. Very rarely she will see her O2 drop to 89%. She has an apt with cardiology in approximately 1 week.    Observations/Objective:  - Able to speak in full sentences; no overt shortness of breath, wheezing or cough  Pulmonary function testing: 07/22/20 - FVC 2.59 (92%), FVC 2.26 (107%), ratio 87, DLCOcor 15.94 (83%)  02/26/2018-FVC 2.55 (88% predicted), ratio 85, FEV1 2.16 (99% predicted), DLCO 18.35 (75%  predicted)  Assessment and Plan:  ILD: - Early NSIP, July 2019 - on surgical lung biopsy - No findings to indicate worsening ILD; CT imaging and PFTs remain stable - Due for repeat HRCT in May 2023  Acute respiratory failure: - Seems to have resolved, she has been monitoring her O2 saturation at home and reports O2 consistently remains >90% RA   Follow Up Instructions:  - FU with Dr. Chase Caller in Dec 2022  OV 08/25/2021  Subjective:  Patient ID: Alison Ayala, female , DOB: 11/22/1945 , age 59 y.o. , MRN: [id number] , ADDRESS: [place] [address]  PCP Harlan Stains, MD Patient Care Team: Harlan Stains, MD as PCP - General (Family Medicine) Nahser, Wonda Cheng, MD as PCP - Cardiology (Cardiology)  This Provider for this visit: Treatment Team:  Attending Provider: Brand Males, MD  Follow-up mediastinal mass Anteriorand PET hot = Castleman's disease,  based on PET scan, July 2019 - biopsy provine Follow-up interstitial lung disease -likely early/mind NSIP, July 2019 - on surgical lung biopsy  08/25/2021 -   Chief Complaint  Patient presents with   Follow-up    Follow up. Patient has no complaints.      HPI Alison Ayala 76 y.o. -personally not seen in 3 years but in the interim has followed with nurse practitioner during the [place] pandemic.  She saw a nurse practitioner last year.  She continues to be stable with very minimal symptom burden.  She says when she climbs 15 steps to her attic she will get a little bit winded and she will breathe a little bit harder but nothing out of the ordinary.  1 year ago she did get cardiac stents.  There is currently no chest pain.  Rates dyspnea is mild no cough no wheezing.  She had high-resolution CT chest in May 2023.  The level of NSIP pulmonary fibrosis very small mild and stable.  She has been reassured about this.  There are no new issues.     Simple office walk 185 feet x  3 laps goal with  forehead probe 07/03/2017  08/23/2017  11/26/2017  02/26/2018  08/25/2021   O2 used Room air Room air Room air rooom air ra  Number laps completed '3 3 3 3 3  '$ Comments about pace  normal  Good pace   Resting Pulse Ox/HR 96% and 64/min 97% and HR 87/min  96% and 73/min 100% and 66/min 96% and HR 103  Final Pulse Ox/HR 92% and 88/min 92% and HR 100/min  96% and 91/min 95% and 95/min 95% and HR 126  Desaturated </= 88% no0 no  no no non  Desaturated <= 3% points yes yes  no no no  Got Tachycardic >/= 90/min no yes  yes yes no  Symptoms at end of test Dyspnea reproduced Mild fatigue only  No complaints Mild dyspnea  Miscellaneous comments Normal pace slow stable  improved        PFT     Latest Ref Rng & Units 07/22/2020    2:36 PM 10/08/2018    1:52 PM 02/26/2018    1:48 PM 07/11/2017    9:49 AM  PFT Results  FVC-Pre L 2.59  2.74  2.55  2.14   FVC-Predicted Pre % 92  93  88  73   FVC-Post L 2.59      FVC-Predicted Post % 92      Pre FEV1/FVC % % 86  87  85  85   Post FEV1/FCV % % 87      FEV1-Pre L 2.23  2.39  2.16  1.83   FEV1-Predicted Pre % 105  107  99  83   FEV1-Post L 2.26      DLCO uncorrected ml/min/mmHg 14.56  17.78  18.35  19.55   DLCO UNC% % 76  90  75  80   DLCO corrected ml/min/mmHg 15.94      DLCO COR %Predicted % 83      DLVA Predicted % 90  111  97  108   TLC L 4.55      TLC %  Predicted % 90      RV % Predicted % 71       HRCT May 2023 -personally visualized  Narrative & Impression  CLINICAL DATA:  Interstitial lung disease   EXAM: CT CHEST WITHOUT CONTRAST   TECHNIQUE: Multidetector CT imaging of the chest was performed following the standard protocol without intravenous contrast. High resolution imaging of the lungs, as well as inspiratory and expiratory imaging, was performed.   RADIATION DOSE REDUCTION: This exam was performed according to the departmental dose-optimization program which includes automated exposure control, adjustment of the mA  and/or kV according to patient size and/or use of iterative reconstruction technique.   COMPARISON:  CT chest dated Jul 04, 2020; chest CT dated September 02, 2018   FINDINGS: Cardiovascular: Normal heart size. No pericardial effusion. Normal caliber thoracic aorta with atherosclerotic disease. Coronary artery calcifications of the LAD and LAD stent. Status post CABG.   Mediastinum/Nodes: Moderate hiatal hernia. Thyroid is unremarkable. No pathologically enlarged lymph nodes seen in the chest.   Lungs/Pleura: Central airways are patent. Mild basilar air trapping. Mild bilateral bronchiectasis, most pronounced in the lower lungs. Prior right lower lobe wedge resection. Left basilar atelectasis. No consolidation, pleural effusion or pneumothorax.   Upper Abdomen: Stable low-attenuation nodule of the left adrenal gland, likely adenoma. No acute abnormality.   Musculoskeletal: Prior median sternotomy with intact sternal wires. No aggressive appearing osseous lesions. VP shunt coursing through the soft tissues of the right hemithorax.   IMPRESSION: 1. Patchy areas of ground-glass attenuation which seen on Jul 04, 2020 chest CT have resolved, and may have been due to pulmonary edema or acute exacerbation of ILD. On today's exam there is very minimal subpleural reticulation with no evidence of progression when compared with prior September 02, 2018 exam. Findings remain in keeping with very mild NSIP pattern fibrosis as demonstrated by wedge biopsy histology. 2. Aortic Atherosclerosis (ICD10-I70.0).     Electronically Signed   By: Yetta Glassman M.D.   On: 06/16/2021 20:29      has a past medical history of CAD (coronary artery disease), Carpal tunnel syndrome of right wrist, Castleman disease ([place]), Diverticulosis, GERD (gastroesophageal reflux disease), H/O hiatal hernia, H/O intraventricular hemorrhage (2004--  SPONTANEOUS), Heart murmur, Hiatal hernia, Hip pain, right (OCCASIONAL --  PINCHED NERVE), History of gastric ulcer (2001), Hyperlipidemia, Hypertension, Hypoglycemia, Internal hemorrhoids, Interstitial lung disease ([place]), [place] esophagus, Mediastinal mass, Mitral valve prolapse, Normal echocardiogram (30 YRS AGO), NSIP (nonspecific interstitial pneumonia) ([place]), S/P ventriculoperitoneal shunt (2004--- PER PT SHUNT IS WORKING WELL --  NO ISSUES), SAH (subarachnoid hemorrhage) ([facility]), Squamous cell cancer of scalp and skin of neck, and Tubular adenoma of colon.   reports that she has never smoked. She has never used smokeless tobacco.  Past Surgical History:  Procedure Laterality Date   BILATERAL FRONTAL INTERVENTRICULAR CATHETER VIA TWIST DRILL HOLE  AUG 2004   SPONTANEOUS SUBARACHNOID  HEMORRHAGE AND  INTRAVENTRICULAR HEMORRHAGE   BIOPSY  06/16/2021   Procedure: BIOPSY;  Surgeon: Jerene Bears, MD;  Location: [facility];  Service: Gastroenterology;;   CARPAL TUNNEL RELEASE  07/26/2011   Procedure: CARPAL TUNNEL RELEASE;  Surgeon: Magnus Sinning, MD;  Location: [facility];  Service: Orthopedics;  Laterality: Right;   CATARACT EXTRACTION W/ INTRAOCULAR LENS  IMPLANT, BILATERAL  2012   BILATERAL   COLONOSCOPY WITH PROPOFOL N/A 06/16/2021   Procedure: COLONOSCOPY WITH PROPOFOL;  Surgeon: Jerene Bears, MD;  Location: [facility];  Service: Gastroenterology;  Laterality: N/A;  CORONARY STENT INTERVENTION N/A 04/08/2020   Procedure: CORONARY STENT INTERVENTION;  Surgeon: Burnell Blanks, MD;  Location: [facility];  Service: Cardiovascular;  Laterality: N/A;   DUPUYTREN CONTRACTURE RELEASE  07/26/2011   Procedure: DUPUYTREN CONTRACTURE RELEASE;  Surgeon: Magnus Sinning, MD;  Location: [facility];  Service: Orthopedics;  Laterality: Right;  EXCISION OF DUPYTRENS BAND THUMB- INDEX WEB SPACE   ESOPHAGEAL MANOMETRY N/A 01/27/2019   Procedure: ESOPHAGEAL MANOMETRY (EM);  Surgeon: Jerene Bears, MD;  Location: [facility];   Service: Gastroenterology;  Laterality: N/A;   ESOPHAGOGASTRODUODENOSCOPY N/A 06/16/2021   Procedure: ESOPHAGOGASTRODUODENOSCOPY (EGD);  Surgeon: Jerene Bears, MD;  Location: [facility];  Service: Gastroenterology;  Laterality: N/A;   EYE SURGERY Bilateral    scrapped corneas   INTRAVASCULAR IMAGING/OCT N/A 04/08/2020   Procedure: INTRAVASCULAR IMAGING/OCT;  Surgeon: Burnell Blanks, MD;  Location: [facility];  Service: Cardiovascular;  Laterality: N/A;   LEFT HEART CATH AND CORONARY ANGIOGRAPHY N/A 04/08/2020   Procedure: LEFT HEART CATH AND CORONARY ANGIOGRAPHY;  Surgeon: Burnell Blanks, MD;  Location: [facility];  Service: Cardiovascular;  Laterality: N/A;   LUNG BIOPSY Right 08/07/2017   Procedure: Open LUNG BIOPSY;  Surgeon: Ivin Poot, MD;  Location: [facility];  Service: Thoracic;  Laterality: Right;   RESECTION OF MEDIASTINAL MASS N/A 08/07/2017   Procedure: RESECTION OF MEDIASTINAL MASS;  Surgeon: Ivin Poot, MD;  Location: [facility];  Service: Thoracic;  Laterality: N/A;   RETINAL DETACHMENT REPAIR W/ SCLERAL BUCKLE LE  03-10-2011   LEFT EYE   STERNOTOMY N/A 08/07/2017   Procedure: STERNOTOMY;  Surgeon: Prescott Gum, Collier Salina, MD;  Location: [facility];  Service: Thoracic;  Laterality: N/A;   VENTRICULOPERITONEAL SHUNT  10-08-2002   RIGHT FRONTAL FOR COMMUNICATING HYDROCEPHALUS    Allergies  Allergen Reactions   Brilinta [Ticagrelor] Shortness Of Breath   Coreg [Carvedilol] Palpitations and Other (See Comments)    Abdominal bloating   Claritin [Loratadine] Other (See Comments)    Blurred vision and dry eyes   Lipitor [Atorvastatin]     fatigue   Lisinopril Other (See Comments)    "drunk feeling"    Immunization History  Administered Date(s) Administered   Fluad Quad(high Dose 65+) 10/08/2018   Influenza Split 10/16/2018   Influenza, High Dose Seasonal PF 10/07/2016, 11/06/2017, 10/15/2020   Influenza-Unspecified 01/13/2011, 12/11/2011, 10/03/2012,  10/07/2013, 10/13/2014, 10/14/2015, 10/24/2016, 11/06/2017, 01/05/2020   PFIZER(Purple Top)SARS-COV-2 Vaccination 02/26/2019, 03/17/2019, 12/19/2019   Pneumococcal Conjugate-13 04/04/2013   Pneumococcal Polysaccharide-23 07/18/2010   Td 10/14/2015   Tdap 05/31/2005   Zoster Recombinat (Shingrix) 11/06/2017, 12/25/2018   Zoster, Live 01/21/2008, 11/06/2017, 12/25/2018    Family History  Problem Relation Age of Onset   Colon cancer Mother 65   Colon polyps Mother    Stomach cancer Maternal Aunt    Breast cancer Maternal Aunt    Uterine cancer Maternal Aunt    Rectal cancer Neg Hx    Esophageal cancer Neg Hx      Current Outpatient Medications:    acetaminophen (TYLENOL) 500 MG tablet, Take 1-2 tablets (500-1,000 mg total) by mouth 2 (two) times daily as needed for moderate pain or headache., Disp: 30 tablet, Rfl: 0   amitriptyline (ELAVIL) 25 MG tablet, TAKE 1 TABLET BY MOUTH EVERYDAY AT BEDTIME, Disp: 90 tablet, Rfl: 1   carboxymethylcellulose (REFRESH PLUS) 0.5 % SOLN, Place 1 drop into both eyes 2 (two) times daily as needed (dry eyes)., Disp: ,  Rfl:    cholecalciferol (VITAMIN D3) 25 MCG (1000 UNIT) tablet, Take 1,000 Units by mouth daily., Disp: , Rfl:    clopidogrel (PLAVIX) 75 MG tablet, Take 1 tablet (75 mg total) by mouth daily., Disp: 90 tablet, Rfl: 3   furosemide (LASIX) 40 MG tablet, Take 1 tablet by mouth every Monday, Wednesday, and Friday, Disp: 36 tablet, Rfl: 3   metoprolol succinate (TOPROL-XL) 100 MG 24 hr tablet, Take 1 tablet (100 mg total) by mouth daily. Take with or immediately following a meal., Disp: 90 tablet, Rfl: 3   nitroGLYCERIN (NITROSTAT) 0.4 MG SL tablet, DISSOLVE 1 TABLET UNDER THE TONGUE ONCE FOR EPIGASTRIC PAIN/CHEST PAIN NOT RELIEVED BY PEPPERMINT ALTOIDS. IF THIS DOES NOT RESOLVE PAIN, CALL DR!, Disp: 25 tablet, Rfl: 0   ondansetron (ZOFRAN-ODT) 4 MG disintegrating tablet, TAKE 1 TABLET BY MOUTH EVERY 8 HOURS AS NEEDED FOR NAUSEA AND VOMITING,  Disp: 30 tablet, Rfl: 0   potassium chloride SA (KLOR-CON M20) 20 MEQ tablet, Take 1 tablet by mouth on same days as Furosemide (Monday, Wednesday, and Friday), Disp: 36 tablet, Rfl: 3   rosuvastatin (CRESTOR) 20 MG tablet, TAKE 1 TABLET BY MOUTH EVERY DAY, Disp: 90 tablet, Rfl: 3   valsartan (DIOVAN) 320 MG tablet, Take 1 tablet (320 mg total) by mouth daily., Disp: 90 tablet, Rfl: 3      Objective:   Vitals:   08/25/21 1349  BP: (!) 146/64  Pulse: 95  Temp: 98.4 F (36.9 C)  TempSrc: Oral  SpO2: 96%  Weight: 228 lb (103.4 kg)  Height: '5\' 3"'$  (1.6 m)    Estimated body mass index is 40.39 kg/m as calculated from the following:   Height as of this encounter: '5\' 3"'$  (1.6 m).   Weight as of this encounter: 228 lb (103.4 kg).  '@WEIGHTCHANGE'$ @  [employer]   08/25/21 1349  Weight: 228 lb (103.4 kg)     Physical Exam    General: No distress. obese Neuro: Alert and Oriented x 3. GCS 15. Speech normal Psych: Pleasant Resp:  Barrel Chest - no.  Wheeze - no, Crackles - no, No overt respiratory distress CVS: Normal heart sounds. Murmurs - YES baseline Ext: Stigmata of Connective Tissue Disease - no HEENT: Normal upper airway. PEERL +. No post nasal drip        Assessment:       ICD-10-CM   1. NSIP (nonspecific interstitial pneumonia) ([employer])  J84.89     2. ILD (interstitial lung disease) ([place])  J84.9          Plan:     Patient Instructions     ICD-10-CM   1. NSIP (nonspecific interstitial pneumonia) ([place]) J84.89   2. ILD (interstitial lung disease) ([place]) J84.9   3. 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 COntinued stability expected thought there is some low risk for progression  Plan - continue to monitor off prednisone  -  6 months do spirometry and dlco  - hold off breathing test at followup - can decide clinically - if lungs ever get worse, a drug called Ofev +/- clinical trials is an option - lose weight - talk to PCP Harlan Stains, MD if medicines are an options - flu shot and RSV Vaccine in the fall  Followup 6 months but after  PFT; symptoms score and walk test at followup     SIGNATURE    Dr. Brand Males, M.D., F.C.C.P,  Pulmonary and Critical Care Medicine Staff  Physician, [place] Director - Interstitial Lung Disease  Program  Pulmonary [place] at [place], [place], [address]  Pager: [phone], If no answer or between  15:00h - 7:00h: call [phone] Telephone: [phone]  2:35 PM 08/25/2021

## 2021-09-13 DIAGNOSIS — I2510 Atherosclerotic heart disease of native coronary artery without angina pectoris: Secondary | ICD-10-CM | POA: Diagnosis not present

## 2021-09-13 DIAGNOSIS — K219 Gastro-esophageal reflux disease without esophagitis: Secondary | ICD-10-CM | POA: Diagnosis not present

## 2021-09-13 DIAGNOSIS — I5032 Chronic diastolic (congestive) heart failure: Secondary | ICD-10-CM | POA: Diagnosis not present

## 2021-09-13 DIAGNOSIS — E785 Hyperlipidemia, unspecified: Secondary | ICD-10-CM | POA: Diagnosis not present

## 2021-09-13 DIAGNOSIS — I10 Essential (primary) hypertension: Secondary | ICD-10-CM | POA: Diagnosis not present

## 2021-09-14 ENCOUNTER — Telehealth: Payer: Self-pay | Admitting: Cardiovascular Disease

## 2021-09-14 NOTE — Telephone Encounter (Signed)
Pt c/o medication issue:  1. Name of Medication:  furosemide (LASIX) 40 MG tablet Hydrochlorothiazide   2. How are you currently taking this medication (dosage and times per day)? Lasix 1 tablet on Monday Wednesday and Friday, hydrochlorothiazide 1 tablet on the other days   3. Are you having a reaction (difficulty breathing--STAT)? no  4. What is your medication issue? [facility] with [facility] states the patient is requesting a refill on the hydrochlorothiazide and they need to know if she still needs to be on the medication. Phone: [phone]

## 2021-09-14 NOTE — Telephone Encounter (Signed)
Per last OV note on 08/01/21:  2.  Acute on chronic diastolic CHF She still eats processed meats at least 2-3 times a week.  I encouraged her to cut out all the processed meats and foods that contain excessive salt. She is having some orthostatic hypotension.  We will reduce the Lasix to 40 mg on Mondays, Wednesdays, Fridays and also reduce the potassium chloride to 20 mEq on Mondays, Wednesdays, Fridays.  Called and spoke with patient who states she has been taking HCTZ she had leftover because she felt like cutting her diuretic to three times weekly would cause her to retain too much fluid, so she "was filling in some with that." Advised her to try the 3x/wk dose and monitor daily weights, then call if she sees her weight trending up or notices swelling in her extremities that's worsening. If that occurs, we can look at adding back in HCTZ or increasing Lasix (pending her BP).

## 2021-09-21 ENCOUNTER — Telehealth: Payer: Self-pay | Admitting: Cardiovascular Disease

## 2021-09-21 NOTE — Telephone Encounter (Signed)
Pt c/o medication issue:  1. Name of Medication: Hydrochlorothiazide   2. How are you currently taking this medication (dosage and times per day)?   3. Are you having a reaction (difficulty breathing--STAT)?   4. What is your medication issue? Katie from [facility] called wanting to know if patient should continue taking this medication on days she is not taking furosemide, or should she discoutine taking the hydrochlorothiazide.

## 2021-09-21 NOTE — Telephone Encounter (Signed)
Pt called [facility] Triage back, per voicemail message I left her in prior note.    Pt stated she was taking Lasix M, W, F, per Dr. Elmarie Shiley orders; Pt stated she had some very mild edema on the off days, so Pt admits to taking HCTZ 50 Mg PRN on the days not taking Lasix.    Pt now states she is no longer taking HCTZ 50 Mg on the off days, but now drinking blue berry tea and eating strawberry's to treat her mild edema.    Pt asked where her mild edema is located? Pt stated she has edema in her fingers and ankles.  However Pt states her weight has NOT varied over 3 lbs.   Since the POC was modified by the patient, and Pt has concerns about her edema.Marland KitchenMarland KitchenMarland KitchenI will forward this message to Dr. Acie Fredrickson and team.    Still waiting on follow up call back from [facility] from [facility].

## 2021-09-21 NOTE — Telephone Encounter (Signed)
Katie from [facility] called back, left voicemail message stating HCTZ was not part of patients care plan per Dr. Acie Fredrickson on 08/01/21.  Attempted x 2, called patient to find out how she is doing at home with her Lasix orders per Dr. Acie Fredrickson, and if she is still taking HCTZ leftovers per Damaris Hippo RN note on 09/14/21?  HCTZ was not part of Pt POC on 08/01/21, as stated above to [employer].  Unknown if Pt was seen today with [facility], seeking details with [facility] if a POC change occurred, so an updated note could be entered / forwarded to Dr. Acie Fredrickson and team.  F/u required.

## 2021-09-22 NOTE — Telephone Encounter (Signed)
Returned call to patient who states the edema is in her fingers and ankles. States the days she takes the Lasix, she is fine, but on off days, she is using blueberry/strawberry tea which she states "works like fluid pills on me." Says that the swelling in her ankles is noticeable, but manageable. Her hands are more concerning for her. States she cannot get her rings off her fingers, the swelling in her fingers does go down some on the days she uses lasix, but overall, stays swollen enough to where she cannot remove her rings. States she's not overly concerned, but wants to make Dr Acie Fredrickson aware since she was using HCTZ and now is not. Educated patient on her diastolic CHF and why she is having the fluid as she states "I just wish I know where the fluid is coming from so I could fix it." States since her last OV on 6/26, she truly has cut out all processed meats as nahser advised. She has had less swelling overall, and the episodes of orthostatic hypotension have much improved-both are still present. I explained how lasix pulls fluid from extracellular space into vasculature to be excreted by kidneys and that this is a constant balancing act; by increasing diuresis, we may worsen the ortho hypotension. Pt questions taking QOD instead of 3x/wk, but then states she can try to manage on her own via use of more tea "and things." Will route to MD for final recommendation.

## 2021-09-26 NOTE — Telephone Encounter (Signed)
Nahser, Alison Cheng, MD  Donnalee Curry K Caller: Unspecified (5 days ago,  8:54 AM) I agree with the note by Donnalee Curry, RN.  Taking more lasix will help with the edema but will worsen her orthostatic hypotension.  She may adjust the lasix ( more days a week or fewer days a week) depending on how she is feeling.   PN    Called and spoke with patient in detail about use of Furosemide and CHF details. All questions answered at this time. She will let us know if she has further issues with her fluid status and will return to next scheduled OV with Christen Bame on 01/26/22.

## 2021-10-05 ENCOUNTER — Encounter (HOSPITAL_COMMUNITY): Payer: Self-pay

## 2021-10-05 ENCOUNTER — Emergency Department (HOSPITAL_COMMUNITY): Payer: Medicare HMO

## 2021-10-05 ENCOUNTER — Observation Stay (HOSPITAL_COMMUNITY)
Admission: EM | Admit: 2021-10-05 | Discharge: 2021-10-05 | Disposition: A | Discharge status: Home / Self Care | Payer: Medicare HMO | Attending: Internal Medicine | Admitting: Internal Medicine

## 2021-10-05 ENCOUNTER — Other Ambulatory Visit: Payer: Self-pay

## 2021-10-05 ENCOUNTER — Encounter (HOSPITAL_COMMUNITY)
Admission: EM | Disposition: A | Discharge status: Home / Self Care | Payer: Self-pay | Source: Home / Self Care | Attending: Emergency Medicine

## 2021-10-05 DIAGNOSIS — I25110 Atherosclerotic heart disease of native coronary artery with unstable angina pectoris: Secondary | ICD-10-CM | POA: Diagnosis not present

## 2021-10-05 DIAGNOSIS — I5032 Chronic diastolic (congestive) heart failure: Secondary | ICD-10-CM | POA: Insufficient documentation

## 2021-10-05 DIAGNOSIS — R0789 Other chest pain: Secondary | ICD-10-CM | POA: Diagnosis not present

## 2021-10-05 DIAGNOSIS — I200 Unstable angina: Secondary | ICD-10-CM | POA: Diagnosis not present

## 2021-10-05 DIAGNOSIS — I110 Hypertensive heart disease with heart failure: Secondary | ICD-10-CM | POA: Diagnosis not present

## 2021-10-05 DIAGNOSIS — E785 Hyperlipidemia, unspecified: Secondary | ICD-10-CM | POA: Insufficient documentation

## 2021-10-05 DIAGNOSIS — R079 Chest pain, unspecified: Secondary | ICD-10-CM | POA: Diagnosis not present

## 2021-10-05 HISTORY — PX: LEFT HEART CATH AND CORONARY ANGIOGRAPHY: CATH118249

## 2021-10-05 LAB — BASIC METABOLIC PANEL
Anion gap: 10 (ref 5–15)
BUN: 14 mg/dL (ref 8–23)
CO2: 23 mmol/L (ref 22–32)
Calcium: 10 mg/dL (ref 8.9–10.3)
Chloride: 108 mmol/L (ref 98–111)
Creatinine, Ser: 0.84 mg/dL (ref 0.44–1.00)
GFR, Estimated: >60 mL/min (ref >60)
Glucose, Bld: 134 mg/dL — ABNORMAL HIGH (ref 70–99)
Potassium: 3.8 mmol/L (ref 3.5–5.1)
Sodium: 141 mmol/L (ref 135–145)

## 2021-10-05 LAB — CBC
HCT: 39.3 % (ref 36.0–46.0)
Hemoglobin: 13.1 g/dL (ref 12.0–15.0)
MCH: 32.1 pg (ref 26.0–34.0)
MCHC: 33.3 g/dL (ref 30.0–36.0)
MCV: 96.3 fL (ref 80.0–100.0)
Platelets: 230 10*3/uL (ref 150–400)
RBC: 4.08 MIL/uL (ref 3.87–5.11)
RDW: 13.3 % (ref 11.5–15.5)
WBC: 4.6 10*3/uL (ref 4.0–10.5)
nRBC: 0 % (ref 0.0–0.2)

## 2021-10-05 LAB — TROPONIN I (HIGH SENSITIVITY)
Troponin I (High Sensitivity): 5 ng/L (ref <18)
Troponin I (High Sensitivity): 6 ng/L (ref <18)

## 2021-10-05 SURGERY — LEFT HEART CATH AND CORONARY ANGIOGRAPHY
Anesthesia: LOCAL

## 2021-10-05 MED ORDER — ROSUVASTATIN CALCIUM 20 MG PO TABS
20.0000 mg | ORAL_TABLET | Freq: Every day | ORAL | Status: DC
  Administered 2021-10-05: 20 mg via ORAL
  Filled 2021-10-05: qty 1

## 2021-10-05 MED ORDER — IRBESARTAN 300 MG PO TABS: 300.0000 mg | ORAL_TABLET | Freq: Every day | ORAL | Status: DC

## 2021-10-05 MED ORDER — VERAPAMIL HCL 2.5 MG/ML IV SOLN
INTRAVENOUS | Status: DC | PRN
  Administered 2021-10-05: 10 mL via INTRA_ARTERIAL

## 2021-10-05 MED ORDER — ASPIRIN 81 MG PO CHEW
324.0000 mg | CHEWABLE_TABLET | Freq: Once | ORAL | Status: AC
  Administered 2021-10-05: 324 mg via ORAL
  Filled 2021-10-05: qty 4

## 2021-10-05 MED ORDER — MIDAZOLAM HCL 2 MG/2ML IJ SOLN
INTRAMUSCULAR | Status: AC
  Filled 2021-10-05: qty 2

## 2021-10-05 MED ORDER — HEPARIN SODIUM (PORCINE) 1000 UNIT/ML IJ SOLN
INTRAMUSCULAR | Status: AC
  Filled 2021-10-05: qty 10

## 2021-10-05 MED ORDER — ACETAMINOPHEN 325 MG PO TABS: 650.0000 mg | ORAL_TABLET | ORAL | Status: DC | PRN

## 2021-10-05 MED ORDER — SODIUM CHLORIDE 0.9 % IV SOLN: INTRAVENOUS | Status: DC

## 2021-10-05 MED ORDER — IOHEXOL 350 MG/ML SOLN
INTRAVENOUS | Status: DC | PRN
  Administered 2021-10-05: 40 mL

## 2021-10-05 MED ORDER — HEPARIN (PORCINE) IN NACL 1000-0.9 UT/500ML-% IV SOLN
INTRAVENOUS | Status: AC
  Filled 2021-10-05: qty 1000

## 2021-10-05 MED ORDER — ASPIRIN 81 MG PO TBEC: 81.0000 mg | DELAYED_RELEASE_TABLET | Freq: Every day | ORAL | Status: DC

## 2021-10-05 MED ORDER — METOPROLOL TARTRATE 25 MG PO TABS: 100.0000 mg | ORAL_TABLET | Freq: Every day | ORAL | Status: DC

## 2021-10-05 MED ORDER — LIDOCAINE HCL (PF) 1 % IJ SOLN
INTRAMUSCULAR | Status: AC
  Filled 2021-10-05: qty 30

## 2021-10-05 MED ORDER — FUROSEMIDE 20 MG PO TABS
40.0000 mg | ORAL_TABLET | ORAL | Status: DC
  Administered 2021-10-05: 40 mg via ORAL
  Filled 2021-10-05: qty 2

## 2021-10-05 MED ORDER — ASPIRIN 81 MG PO CHEW: 81.0000 mg | CHEWABLE_TABLET | ORAL | Status: DC

## 2021-10-05 MED ORDER — SODIUM CHLORIDE 0.9 % IV SOLN
INTRAVENOUS | Status: DC
Start: 2021-10-05 — End: 2021-10-05

## 2021-10-05 MED ORDER — SODIUM CHLORIDE 0.9 % IV SOLN: 250.0000 mL | INTRAVENOUS | Status: DC | PRN

## 2021-10-05 MED ORDER — HEPARIN (PORCINE) 25000 UT/250ML-% IV SOLN
900.0000 [IU]/h | INTRAVENOUS | Status: DC
  Administered 2021-10-05: 900 [IU]/h via INTRAVENOUS
  Filled 2021-10-05: qty 250

## 2021-10-05 MED ORDER — SODIUM CHLORIDE 0.9% FLUSH: 3.0000 mL | INTRAVENOUS | Status: DC | PRN

## 2021-10-05 MED ORDER — FENTANYL CITRATE (PF) 100 MCG/2ML IJ SOLN
INTRAMUSCULAR | Status: AC
  Filled 2021-10-05: qty 2

## 2021-10-05 MED ORDER — LIDOCAINE HCL (PF) 1 % IJ SOLN
INTRAMUSCULAR | Status: DC | PRN
  Administered 2021-10-05: 2 mL

## 2021-10-05 MED ORDER — VERAPAMIL HCL 2.5 MG/ML IV SOLN
INTRAVENOUS | Status: AC
  Filled 2021-10-05: qty 2

## 2021-10-05 MED ORDER — HEPARIN (PORCINE) IN NACL 1000-0.9 UT/500ML-% IV SOLN
INTRAVENOUS | Status: DC | PRN
  Administered 2021-10-05 (×2): 500 mL

## 2021-10-05 MED ORDER — FENTANYL CITRATE (PF) 100 MCG/2ML IJ SOLN
INTRAMUSCULAR | Status: DC | PRN
  Administered 2021-10-05: 25 ug via INTRAVENOUS

## 2021-10-05 MED ORDER — NITROGLYCERIN 0.4 MG SL SUBL: 0.4000 mg | SUBLINGUAL_TABLET | SUBLINGUAL | Status: DC | PRN

## 2021-10-05 MED ORDER — MIDAZOLAM HCL 2 MG/2ML IJ SOLN
INTRAMUSCULAR | Status: DC | PRN
  Administered 2021-10-05: 1 mg via INTRAVENOUS

## 2021-10-05 MED ORDER — ONDANSETRON HCL 4 MG/2ML IJ SOLN: 4.0000 mg | Freq: Four times a day (QID) | INTRAMUSCULAR | Status: DC | PRN

## 2021-10-05 MED ORDER — SODIUM CHLORIDE 0.9% FLUSH: 3.0000 mL | Freq: Two times a day (BID) | INTRAVENOUS | Status: DC

## 2021-10-05 MED ORDER — HEPARIN BOLUS VIA INFUSION
4000.0000 [IU] | Freq: Once | INTRAVENOUS | Status: AC
  Administered 2021-10-05: 4000 [IU] via INTRAVENOUS
  Filled 2021-10-05: qty 4000

## 2021-10-05 MED ORDER — HEPARIN SODIUM (PORCINE) 1000 UNIT/ML IJ SOLN
INTRAMUSCULAR | Status: DC | PRN
  Administered 2021-10-05: 5000 [IU] via INTRAVENOUS

## 2021-10-05 MED ORDER — CLOPIDOGREL BISULFATE 75 MG PO TABS: 75.0000 mg | ORAL_TABLET | Freq: Every day | ORAL | Status: DC

## 2021-10-05 SURGICAL SUPPLY — 10 items
BAND CMPR LRG ZPHR (HEMOSTASIS) ×1
BAND ZEPHYR COMPRESS 30 LONG (HEMOSTASIS) IMPLANT
CATH OPTITORQUE TIG 4.0 5F (CATHETERS) IMPLANT
GLIDESHEATH SLEND SS 6F .021 (SHEATH) IMPLANT
GUIDEWIRE INQWIRE 1.5J.035X260 (WIRE) IMPLANT
INQWIRE 1.5J .035X260CM (WIRE) ×1
KIT HEART LEFT (KITS) ×2 IMPLANT
PACK CARDIAC CATHETERIZATION (CUSTOM PROCEDURE TRAY) ×2 IMPLANT
TRANSDUCER W/STOPCOCK (MISCELLANEOUS) ×2 IMPLANT
TUBING CIL FLEX 10 FLL-RA (TUBING) ×2 IMPLANT

## 2021-10-05 NOTE — Progress Notes (Signed)
TR BAND REMOVAL  LOCATION:    right radial  DEFLATED PER PROTOCOL:    Yes.    TIME BAND OFF / DRESSING APPLIED: 10/05/21   SITE UPON ARRIVAL:    Level 0  SITE AFTER BAND REMOVAL:    Level 0  CIRCULATION SENSATION AND MOVEMENT:    Within Normal Limits   Yes.    COMMENTS:

## 2021-10-05 NOTE — Progress Notes (Signed)
Informed Ria Comment, NP that ECHO did not come to see patient prior to D/C. Ria Comment stated that it was ok and she would schedule her for outpatient ECHO. Patient informed of this information and verbalized understanding.

## 2021-10-05 NOTE — Interval H&P Note (Signed)
History and Physical Interval Note:  10/05/2021 1:23 PM  Alison Ayala  has presented today for surgery, with the diagnosis of unstable angina.  The various methods of treatment have been discussed with the patient and family. After consideration of risks, benefits and other options for treatment, the patient has consented to  Procedure(s): LEFT HEART CATH AND CORONARY ANGIOGRAPHY (N/A) as a surgical intervention.  The patient's history has been reviewed, patient examined, no change in status, stable for surgery.  I have reviewed the patient's chart and labs.  Questions were answered to the patient's satisfaction.     [name]

## 2021-10-05 NOTE — Progress Notes (Signed)
ANTICOAGULATION CONSULT NOTE - Initial Consult  Pharmacy Consult for heparin  Indication: chest pain/ACS  Allergies  Allergen Reactions   Brilinta [Ticagrelor] Shortness Of Breath   Coreg [Carvedilol] Palpitations and Other (See Comments)    Abdominal bloating   Claritin [Loratadine] Other (See Comments)    Blurred vision and dry eyes   Lipitor [Atorvastatin]     fatigue   Lisinopril Other (See Comments)    "drunk feeling"    Patient Measurements: Height: 5' 3.5" (161.3 cm) Weight: 100.7 kg (222 lb) IBW/kg (Calculated) : 53.55 Heparin Dosing Weight: 77.1kg   Vital Signs: Temp: 98.8 F (37.1 C) (08/30 0725) Temp Source: Oral (08/30 0725) BP: 156/66 (08/30 0725) Pulse Rate: 70 (08/30 0725)  Labs: Recent Labs    10/05/21 0735  HGB 13.1  HCT 39.3  PLT 230  CREATININE 0.84  TROPONINIHS 5    Estimated Creatinine Clearance: 65.1 mL/min (by C-G formula based on SCr of 0.84 mg/dL).   Medical History: Past Medical History:  Diagnosis Date   CAD (coronary artery disease)    a. s/p DES to LAD 04/2020.   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[place])    Diverticulosis    GERD (gastroesophageal reflux disease)    H/O hiatal hernia    H/O intraventricular hemorrhage 2004--  SPONTANEOUS   S/P SHUNT   Heart murmur    Hiatal hernia    Hip pain, right OCCASIONAL -- PINCHED NERVE   History of gastric ulcer 2001   Hyperlipidemia    Hypertension    Hypoglycemia    Internal hemorrhoids    Interstitial lung disease ([place])    Jackhammer esophagus    Mediastinal mass    Mitral valve prolapse    Normal echocardiogram 30 YRS AGO   NSIP (nonspecific interstitial pneumonia) ([facility])    S/P ventriculoperitoneal shunt 2004--- PER PT SHUNT IS WORKING WELL --  NO ISSUES   SAH (subarachnoid hemorrhage) ([facility])    Squamous cell cancer of scalp and skin of neck    mole back of head   Tubular adenoma of colon     Medications:  (Not in a hospital admission)    Assessment: Patient presents with chest pain and tightness which has been recurrent over the past 6 months. She states that she does get some relief with nitroglycerin. Patient had a stent placed approximately a year and a half ago. Current troponin is negative.   Current renal function is stable and CBC WNL. Home medications reviewed and patient is on no anticoagulants, appears patient does take plavix.   Goal of Therapy:  Heparin level 0.3-0.7 units/ml Monitor platelets by anticoagulation protocol: Yes   Plan:  Give 4000 units bolus x 1 Start heparin infusion at 900 units/hr Check anti-Xa level in 6 hours and daily while on heparin Continue to monitor H&H and platelets  Ventura Sellers 10/05/2021,9:36 AM

## 2021-10-05 NOTE — ED Triage Notes (Addendum)
Pt arrived POV from home c/o intermittent CP, dizziness and SHOB x6 months. Pt states it feels like it is getting worse. She seen her doctor recently but he did not ask her how she felt so she did not mention it. Pt states she had a nitro at home and took one and now is pain free currently but feels like she cannot get enough air.

## 2021-10-05 NOTE — Progress Notes (Signed)
Spoke with Dr. Ali Lowe who stated that if patient did not get the ECHO before she was discharged that it was fine. Patient will D/C at 1610.

## 2021-10-05 NOTE — Discharge Summary (Addendum)
Discharge Summary    Patient ID: Alison Ayala MRN: [id number]; DOB: [birth year]  Admit date: 10/05/2021 Discharge date: 10/05/2021  PCP:  Harlan Stains, MD   [facility] Cardiologist:  Mertie Moores, MD   {  Discharge Diagnoses    Active Problems:   Atypical chest pain  Diagnostic Studies/Procedures    Cath: 10/05/21    Ost Cx lesion is 20% stenosed.   Non-stenotic Ost LAD to Prox LAD lesion was previously treated.   1.  Widely patent ostial LAD stent with no significant restenosis.  The stent extends 1 to 2 mm into the left main with partial jailing of the left circumflex.  However, there is only mild ostial left circumflex stenosis.  The coronary arteries are otherwise normal. 2.  Left ventricular angiography was not performed.  LVEDP was mildly elevated.   Recommendations: Suspect noncardiac chest pain. Continue medical therapy. _____________   History of Present Illness     Alison Ayala is a 76 y.o. female with a hx of hypertension, hyperlipidemia, CAD S/P DES to LAD on 3/22, and chronic diastolic HF, who is being seen today for the evaluation of chest pain at the request of emergency medicine physician.  Alison Ayala is a 76 year old female with a past medical history of hypertension, hyperlipidemia, CAD S/P DES to LAD on 3/22, and chronic diastolic HF with an EF of 60-65% with no LV abnormalities in 2022, who presented to the emergency department with chest pain. She stated she has had intermittent chest tightness for the last 6 months, associated with, palpitations dizziness and general fatigue symptoms. She describes the pain as squeezing and non-radiating. Stated it is currently at a 5 out of 10. In general, she stated that she has been moving "slower". She states she was dizzy when walking from her room in the ED to the bathroom. She denies any shortness of breath. She stated nitroglycerin .4 mg makes the pain a little better, and she has had  the pain constantly for the last 6 weeks. She was able to do all ADL/and IADLs independently. She stated since her last appointment with cardiology two months ago, she has been trying to eat less sodium rich foods, however has been unable to lose any weight. She denied any use of cigarettes, uses alcohol occasionally, and has never used illicit drugs such as cocaine. Given her symptoms she was admitted with plans for cardiac cath.   Hospital Course     Chest pain -- High-sensitivity troponin negative x2.  Given her concerning symptoms she underwent cardiac catheterization noted above which showed 20% ostial left circumflex lesion with widely patent LAD stent with no significant in-stent restenosis.  No culprit lesion for symptoms.  It was felt she had noncardiac chest pain.  Recommendations to continue medical therapy. -- Continue Plavix, Crestor 20 mg daily, Toprol-XL 100 mg daily  Hypertension -- Stable with current therapy, continue metoprolol XL 100 mg daily, valsartan 320 mg daily  Hyperlipidemia -- Continue Crestor 20 mg daily  Patient was seen by Dr. Ali Lowe and deemed stable for discharge home. Follow up arranged in the office.   Discharge Vitals Blood pressure (!) 142/61, pulse 70, temperature 98.2 F (36.8 C), temperature source Oral, resp. rate 18, height 5' 3.5" (1.613 m), weight 100.7 kg, SpO2 97 %.  Filed Weights   10/05/21 0729  Weight: 100.7 kg    Labs & Radiologic Studies    CBC Recent Labs    10/05/21 0735  WBC 4.6  HGB 13.1  HCT 39.3  MCV 96.3  PLT 811   Basic Metabolic Panel Recent Labs    10/05/21 0735  NA 141  K 3.8  CL 108  CO2 23  GLUCOSE 134*  BUN 14  CREATININE 0.84  CALCIUM 10.0   Liver Function Tests No results for input(s): "AST", "ALT", "ALKPHOS", "BILITOT", "PROT", "ALBUMIN" in the last 72 hours. No results for input(s): "LIPASE", "AMYLASE" in the last 72 hours. High Sensitivity Troponin:   Recent Labs  Lab 10/05/21 0735  10/05/21 0939  TROPONINIHS 5 6    BNP Invalid input(s): "POCBNP" D-Dimer No results for input(s): "DDIMER" in the last 72 hours. Hemoglobin A1C No results for input(s): "HGBA1C" in the last 72 hours. Fasting Lipid Panel No results for input(s): "CHOL", "HDL", "LDLCALC", "TRIG", "CHOLHDL", "LDLDIRECT" in the last 72 hours. Thyroid Function Tests No results for input(s): "TSH", "T4TOTAL", "T3FREE", "THYROIDAB" in the last 72 hours.  Invalid input(s): "FREET3" _____________  CARDIAC CATHETERIZATION  Result Date: 10/05/2021   Colon Flattery Cx lesion is 20% stenosed.   Non-stenotic Ost LAD to Prox LAD lesion was previously treated. 1.  Widely patent ostial LAD stent with no significant restenosis.  The stent extends 1 to 2 mm into the left main with partial jailing of the left circumflex.  However, there is only mild ostial left circumflex stenosis.  The coronary arteries are otherwise normal. 2.  Left ventricular angiography was not performed.  LVEDP was mildly elevated. Recommendations: Suspect noncardiac chest pain. Continue medical therapy.   DG Chest 2 View  Result Date: 10/05/2021 CLINICAL DATA:  Chest pain EXAM: CHEST - 2 VIEW COMPARISON:  07/02/2020 FINDINGS: Normal heart size. Prior median sternotomy. There is no edema, consolidation, effusion, or pneumothorax. A shunt catheter traverses the right chest without visible discontinuity. IMPRESSION: No evidence of active disease. Electronically Signed   By: Jorje Guild M.D.   On: 10/05/2021 07:53    Disposition   Pt is being discharged home today in good condition.  Follow-up Plans & Appointments     Follow-up Information     Emmaline Life, NP Follow up on 10/20/2021.   Specialty: Nurse Practitioner Why: at 2:20pm for your follow up appt Contact information: [address] [place] [id number] [phone]                Discharge Instructions     Call MD for:  difficulty breathing, headache or visual  disturbances   Complete by: As directed    Call MD for:  persistant dizziness or light-headedness   Complete by: As directed    Call MD for:  redness, tenderness, or signs of infection (pain, swelling, redness, odor or green/yellow discharge around incision site)   Complete by: As directed    Diet - low sodium heart healthy   Complete by: As directed    Discharge instructions   Complete by: As directed    Radial Site Care Refer to this sheet in the next few weeks. These instructions provide you with information on caring for yourself after your procedure. Your caregiver may also give you more specific instructions. Your treatment has been planned according to current medical practices, but problems sometimes occur. Call your caregiver if you have any problems or questions after your procedure. HOME CARE INSTRUCTIONS You may shower the day after the procedure. Remove the bandage (dressing) and gently wash the site with plain soap and water. Gently pat the site dry.  Do  not apply powder or lotion to the site.  Do not submerge the affected site in water for 3 to 5 days.  Inspect the site at least twice daily.  Do not flex or bend the affected arm for 24 hours.  No lifting over 5 pounds (2.3 kg) for 5 days after your procedure.  Do not drive home if you are discharged the same day of the procedure. Have someone else drive you.  You may drive 24 hours after the procedure unless otherwise instructed by your caregiver.  What to expect: Any bruising will usually fade within 1 to 2 weeks.  Blood that collects in the tissue (hematoma) may be painful to the touch. It should usually decrease in size and tenderness within 1 to 2 weeks.  SEEK IMMEDIATE MEDICAL CARE IF: You have unusual pain at the radial site.  You have redness, warmth, swelling, or pain at the radial site.  You have drainage (other than a small amount of blood on the dressing).  You have chills.  You have a fever or persistent  symptoms for more than 72 hours.  You have a fever and your symptoms suddenly get worse.  Your arm becomes pale, cool, tingly, or numb.  You have heavy bleeding from the site. Hold pressure on the site.   Increase activity slowly   Complete by: As directed        Discharge Medications   Allergies as of 10/05/2021       Reactions   Brilinta [ticagrelor] Shortness Of Breath   Coreg [carvedilol] Palpitations, Other (See Comments)   Abdominal bloating   Claritin [loratadine] Other (See Comments)   Blurred vision Dry eyes   Lipitor [atorvastatin] Other (See Comments)   Fatigue    Lisinopril Other (See Comments)   "drunk feeling"        Medication List     STOP taking these medications    amitriptyline 25 MG tablet Commonly known as: ELAVIL   sodium chloride 0.9 % nebulizer solution       TAKE these medications    acetaminophen 500 MG tablet Commonly known as: TYLENOL Take 1-2 tablets (500-1,000 mg total) by mouth 2 (two) times daily as needed for moderate pain or headache. What changed: how much to take   carboxymethylcellulose 0.5 % Soln Commonly known as: REFRESH PLUS Place 1 drop into both eyes in the morning.   clopidogrel 75 MG tablet Commonly known as: PLAVIX Take 1 tablet (75 mg total) by mouth daily.   furosemide 40 MG tablet Commonly known as: LASIX Take 1 tablet by mouth every Monday, Wednesday, and Friday What changed:  how much to take how to take this when to take this additional instructions   metoprolol succinate 100 MG 24 hr tablet Commonly known as: TOPROL-XL Take 1 tablet (100 mg total) by mouth daily. Take with or immediately following a meal. What changed: additional instructions   nitroGLYCERIN 0.4 MG SL tablet Commonly known as: NITROSTAT DISSOLVE 1 TABLET UNDER THE TONGUE ONCE FOR EPIGASTRIC PAIN/CHEST PAIN NOT RELIEVED BY PEPPERMINT ALTOIDS. IF THIS DOES NOT RESOLVE PAIN, CALL DR! What changed: See the new instructions.    ondansetron 4 MG disintegrating tablet Commonly known as: ZOFRAN-ODT TAKE 1 TABLET BY MOUTH EVERY 8 HOURS AS NEEDED FOR NAUSEA AND VOMITING What changed: See the new instructions.   potassium chloride SA 20 MEQ tablet Commonly known as: Klor-Con M20 Take 1 tablet by mouth on same days as Furosemide (Monday, Wednesday, and Friday) What  changed:  how much to take how to take this when to take this additional instructions   rosuvastatin 20 MG tablet Commonly known as: CRESTOR TAKE 1 TABLET BY MOUTH EVERY DAY   valsartan 320 MG tablet Commonly known as: DIOVAN Take 1 tablet (320 mg total) by mouth daily.        Outstanding Labs/Studies   N/a   Duration of Discharge Encounter   Greater than 30 minutes including physician time.  Signed, Reino Bellis, NP 10/05/2021, 4:04 PM   ATTENDING ATTESTATION:  After conducting a review of all available clinical information with the care team, interviewing the patient, and performing a physical exam, I agree with the findings and plan described in this note.   GEN: No acute distress.   HEENT:  MMM, no JVD, no scleral icterus Cardiac: RRR, no murmurs, rubs, or gallops.  Respiratory: Clear to auscultation bilaterally. GI: Soft, nontender, non-distended  MS: No edema; No deformity. Neuro:  Nonfocal  Vasc: TR band in place  The patient is doing well after uncomplicated coronary angiography demonstrating a widely patent ostial LAD stent.  Stable for discharge home.  I did discuss with the patient the need to have further evaluation for noncardiac chest pain particularly given her GI history.  Lenna Sciara, MD Pager [phone]

## 2021-10-05 NOTE — ED Provider Notes (Signed)
[facility] EMERGENCY DEPARTMENT Provider Note   CSN: [id number] Arrival date & time: 10/05/21  3710     History  Chief Complaint  Patient presents with   Chest Pain    Alison Ayala is a 76 y.o. female.   Chest Pain Patient presents with chest pain.  Feels tightness.  Has been having episodes over the last 6 months.  States feels tightness in her abdomen at times but mostly in chest.  States it gets worse 6 exertion.  States she cannot do the same activity because of it.  States she is had to cut back.  States she gets it at rest and 2.  Has been hurting somewhat constantly for a while now.  Nitroglycerin will help a little bit.  States it feels like before she had her heart stent about a year and a half ago.  No cough.  Occasionally has some swelling in her legs.  Takes Lasix approximately every other day and states it does not seem to matter whether she is taking the Lasix or not with the pain.    Past Medical History:  Diagnosis Date   CAD (coronary artery disease)    a. s/p DES to LAD 04/2020.   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[place])    Diverticulosis    GERD (gastroesophageal reflux disease)    H/O hiatal hernia    H/O intraventricular hemorrhage 2004--  SPONTANEOUS   S/P SHUNT   Heart murmur    Hiatal hernia    Hip pain, right OCCASIONAL -- PINCHED NERVE   History of gastric ulcer 2001   Hyperlipidemia    Hypertension    Hypoglycemia    Internal hemorrhoids    Interstitial lung disease ([place])    Jackhammer esophagus    Mediastinal mass    Mitral valve prolapse    Normal echocardiogram 30 YRS AGO   NSIP (nonspecific interstitial pneumonia) (HCC)    S/P ventriculoperitoneal shunt 2004--- PER PT SHUNT IS WORKING WELL --  NO ISSUES   SAH (subarachnoid hemorrhage) (HCC)    Squamous cell cancer of scalp and skin of neck    mole back of head   Tubular adenoma of colon     Home Medications Prior to Admission  medications   Medication Sig Start Date End Date Taking? Authorizing Provider  acetaminophen (TYLENOL) 500 MG tablet Take 1-2 tablets (500-1,000 mg total) by mouth 2 (two) times daily as needed for moderate pain or headache. 08/10/17   Nani Skillern, PA-C  amitriptyline (ELAVIL) 25 MG tablet TAKE 1 TABLET BY MOUTH EVERYDAY AT BEDTIME 08/08/21   Pyrtle, Lajuan Lines, MD  carboxymethylcellulose (REFRESH PLUS) 0.5 % SOLN Place 1 drop into both eyes 2 (two) times daily as needed (dry eyes).    [provider]  cholecalciferol (VITAMIN D3) 25 MCG (1000 UNIT) tablet Take 1,000 Units by mouth daily.    [provider]  clopidogrel (PLAVIX) 75 MG tablet Take 1 tablet (75 mg total) by mouth daily. 10/25/20   Nahser, Wonda Cheng, MD  furosemide (LASIX) 40 MG tablet Take 1 tablet by mouth every Monday, Wednesday, and Friday 08/01/21   Nahser, Wonda Cheng, MD  metoprolol succinate (TOPROL-XL) 100 MG 24 hr tablet Take 1 tablet (100 mg total) by mouth daily. Take with or immediately following a meal. 08/01/21   Nahser, Wonda Cheng, MD  nitroGLYCERIN (NITROSTAT) 0.4 MG SL tablet DISSOLVE 1 TABLET UNDER THE TONGUE ONCE FOR EPIGASTRIC  PAIN/CHEST PAIN NOT RELIEVED BY PEPPERMINT ALTOIDS. IF THIS DOES NOT RESOLVE PAIN, CALL DR! 01/03/21   Jerene Bears, MD  ondansetron (ZOFRAN-ODT) 4 MG disintegrating tablet TAKE 1 TABLET BY MOUTH EVERY 8 HOURS AS NEEDED FOR NAUSEA AND VOMITING 07/07/21   Zehr, Laban Emperor, PA-C  potassium chloride SA (KLOR-CON M20) 20 MEQ tablet Take 1 tablet by mouth on same days as Furosemide (Monday, Wednesday, and Friday) 08/01/21   Nahser, Wonda Cheng, MD  rosuvastatin (CRESTOR) 20 MG tablet TAKE 1 TABLET BY MOUTH EVERY DAY 07/15/21   Nahser, Wonda Cheng, MD  valsartan (DIOVAN) 320 MG tablet Take 1 tablet (320 mg total) by mouth daily. 05/03/20   Richardson Dopp T, PA-C      Allergies    Brilinta [ticagrelor], Coreg [carvedilol], Claritin [loratadine], Lipitor [atorvastatin], and Lisinopril    Review of  Systems   Review of Systems  Cardiovascular:  Positive for chest pain.    Physical Exam Updated Vital Signs BP (!) 156/66 (BP Location: Right Arm)   Pulse 70   Temp 98.8 F (37.1 C) (Oral)   Resp 17   Ht 5' 3.5" (1.613 m)   Wt 100.7 kg   SpO2 95%   BMI 38.71 kg/m  Physical Exam Vitals and nursing note reviewed.  Cardiovascular:     Rate and Rhythm: Normal rate and regular rhythm.  Pulmonary:     Breath sounds: No decreased breath sounds or wheezing.  Chest:     Chest wall: No tenderness.  Musculoskeletal:     Cervical back: Neck supple.     Right lower leg: Edema present.     Left lower leg: Edema present.     Comments: Edema in bilateral lower extremities but worse on the right.  Patient states it is always worse on her right.  Skin:    General: Skin is warm.  Neurological:     Mental Status: She is alert.     ED Results / Procedures / Treatments   Labs (all labs ordered are listed, but only abnormal results are displayed) Labs Reviewed  BASIC METABOLIC PANEL - Abnormal; Notable for the following components:      Result Value   Glucose, Bld 134 (*)    All other components within normal limits  CBC  TROPONIN I (HIGH SENSITIVITY)  TROPONIN I (HIGH SENSITIVITY)    EKG EKG Interpretation  Date/Time:  Wednesday October 05 2021 07:23:05 EDT Ventricular Rate:  74 PR Interval:  216 QRS Duration: 90 QT Interval:  386 QTC Calculation: 428 R Axis:   52 Text Interpretation: Sinus rhythm with 1st degree A-V block Otherwise normal ECG When compared with ECG of 02-Jul-2020 15:43, No significant change since last tracing Confirmed by Davonna Belling [phone]) on 10/05/2021 8:30:08 AM  Radiology DG Chest 2 View  Result Date: 10/05/2021 CLINICAL DATA:  Chest pain EXAM: CHEST - 2 VIEW COMPARISON:  07/02/2020 FINDINGS: Normal heart size. Prior median sternotomy. There is no edema, consolidation, effusion, or pneumothorax. A shunt catheter traverses the right chest without  visible discontinuity. IMPRESSION: No evidence of active disease. Electronically Signed   By: Jorje Guild M.D.   On: 10/05/2021 07:53    Procedures Procedures    Medications Ordered in ED Medications - No data to display  ED Course/ Medical Decision Making/ A&P                           Medical Decision Making Amount and/or Complexity  of Data Reviewed Labs: ordered. Radiology: ordered.   Patient with exertional chest pain.  Differential diagnosis includes pulmonary issues, cardiac issues such as coronary artery disease.  Unstable angina. Lab work reviewed and reassuring.  Troponin negative.  EKG reassuring.  Chest x-ray independently interpreted and also does not show clear cause of the pain.  However this is pain that is similar to her previous anginal pain that had resolved after her stent.  Now has recurred.  Does have some chronic lung issues but states this does not feel like her breathing issues in the past.  I think at this point we have to assume it is an unstable angina and will discuss with cardiology.  Will require admission to the hospital.  CRITICAL CARE Performed by: Davonna Belling Total critical care time: 30 minutes Critical care time was exclusive of separately billable procedures and treating other patients. Critical care was necessary to treat or prevent imminent or life-threatening deterioration. Critical care was time spent personally by me on the following activities: development of treatment plan with patient and/or surrogate as well as nursing, discussions with consultants, evaluation of patient's response to treatment, examination of patient, obtaining history from patient or surrogate, ordering and performing treatments and interventions, ordering and review of laboratory studies, ordering and review of radiographic studies, pulse oximetry and re-evaluation of patient's condition.         Final Clinical Impression(s) / ED Diagnoses Final diagnoses:   Unstable angina [facility])    Rx / DC Orders ED Discharge Orders     None         Davonna Belling, MD 10/05/21 [phone]

## 2021-10-05 NOTE — H&P (Addendum)
Cardiology Consultation:    Patient ID: Alison Ayala MRN: [id number]; DOB: [birth year]  Admit date: 10/05/2021 Date of Consult: 10/05/2021  Primary Care Provider: Harlan Stains, MD Primary Cardiologist: Mertie Moores, MD  Primary Electrophysiologist:  None    Patient Profile:   Alison Ayala is a 76 y.o. female with a hx of hypertension, hyperlipidemia, CAD S/P DES to LAD on 3/22, and chronic diastolic HF, who is being seen today for the evaluation of chest pain at the request of emergency medicine physician.  History of Present Illness:   Ms. Alison Ayala is a 76 year old female with a past medical history of hypertension, hyperlipidemia, CAD S/P DES to LAD on 3/22, and chronic diastolic HF with an EF of 60-65% with no LV abnormalities in 2022, who presents to the emergency department with chest pain. She states she has had intermittent chest tightness for the last 6 months, associated with, palpitations dizziness and general fatigue symptoms. She describes the pain as squeezing and non-radiating. States it is currently at a 5 out of 10. In general, she states that she has been moving "slower". She states she was dizzy when walking from her room in the ED to the bathroom. She denies any shortness of breath. She states nitroglycerin .4 mg makes the pain a little better, and she has had the pain constantly for the last 6 weeks. She is able to do all ADL/and IADLs independently. She states since her last appointment with cardiology two months ago, she has been trying to eat less sodium rich foods, however has been unable to lose any weight. She denies any use of cigarettes, uses alcohol occasionally, and has never used illicit drugs such as cocaine.    Past Medical History:  Diagnosis Date   CAD (coronary artery disease)    a. s/p DES to LAD 04/2020.   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[place])    Diverticulosis    GERD (gastroesophageal reflux disease)    H/O  hiatal hernia    H/O intraventricular hemorrhage 2004--  SPONTANEOUS   S/P SHUNT   Heart murmur    Hiatal hernia    Hip pain, right OCCASIONAL -- PINCHED NERVE   History of gastric ulcer 2001   Hyperlipidemia    Hypertension    Hypoglycemia    Internal hemorrhoids    Interstitial lung disease ([place])    Jackhammer esophagus    Mediastinal mass    Mitral valve prolapse    Normal echocardiogram 30 YRS AGO   NSIP (nonspecific interstitial pneumonia) (HCC)    S/P ventriculoperitoneal shunt 2004--- PER PT SHUNT IS WORKING WELL --  NO ISSUES   SAH (subarachnoid hemorrhage) (HCC)    Squamous cell cancer of scalp and skin of neck    mole back of head   Tubular adenoma of colon     Past Surgical History:  Procedure Laterality Date   BILATERAL FRONTAL INTERVENTRICULAR CATHETER VIA TWIST DRILL HOLE  AUG 2004   SPONTANEOUS SUBARACHNOID  HEMORRHAGE AND  INTRAVENTRICULAR HEMORRHAGE   BIOPSY  06/16/2021   Procedure: BIOPSY;  Surgeon: Jerene Bears, MD;  Location: [facility];  Service: Gastroenterology;;   Wilmon Pali RELEASE  07/26/2011   Procedure: CARPAL TUNNEL RELEASE;  Surgeon: Magnus Sinning, MD;  Location: [place];  Service: Orthopedics;  Laterality: Right;   CATARACT EXTRACTION W/ INTRAOCULAR LENS  IMPLANT, BILATERAL  2012   BILATERAL   COLONOSCOPY WITH PROPOFOL N/A 06/16/2021  Procedure: COLONOSCOPY WITH PROPOFOL;  Surgeon: Jerene Bears, MD;  Location: [facility];  Service: Gastroenterology;  Laterality: N/A;   CORONARY STENT INTERVENTION N/A 04/08/2020   Procedure: CORONARY STENT INTERVENTION;  Surgeon: Burnell Blanks, MD;  Location: [place] [facility];  Service: Cardiovascular;  Laterality: N/A;   DUPUYTREN CONTRACTURE RELEASE  07/26/2011   Procedure: DUPUYTREN CONTRACTURE RELEASE;  Surgeon: Magnus Sinning, MD;  Location: [place] [facility];  Service: Orthopedics;  Laterality: Right;  EXCISION OF DUPYTRENS BAND THUMB- INDEX WEB SPACE    ESOPHAGEAL MANOMETRY N/A 01/27/2019   Procedure: ESOPHAGEAL MANOMETRY (EM);  Surgeon: Jerene Bears, MD;  Location: [facility];  Service: Gastroenterology;  Laterality: N/A;   ESOPHAGOGASTRODUODENOSCOPY N/A 06/16/2021   Procedure: ESOPHAGOGASTRODUODENOSCOPY (EGD);  Surgeon: Jerene Bears, MD;  Location: [facility];  Service: Gastroenterology;  Laterality: N/A;   EYE SURGERY Bilateral    scrapped corneas   INTRAVASCULAR IMAGING/OCT N/A 04/08/2020   Procedure: INTRAVASCULAR IMAGING/OCT;  Surgeon: Burnell Blanks, MD;  Location: [place] [facility];  Service: Cardiovascular;  Laterality: N/A;   LEFT HEART CATH AND CORONARY ANGIOGRAPHY N/A 04/08/2020   Procedure: LEFT HEART CATH AND CORONARY ANGIOGRAPHY;  Surgeon: Burnell Blanks, MD;  Location: [place] [facility];  Service: Cardiovascular;  Laterality: N/A;   LUNG BIOPSY Right 08/07/2017   Procedure: Open LUNG BIOPSY;  Surgeon: Ivin Poot, MD;  Location: [place];  Service: Thoracic;  Laterality: Right;   RESECTION OF MEDIASTINAL MASS N/A 08/07/2017   Procedure: RESECTION OF MEDIASTINAL MASS;  Surgeon: Ivin Poot, MD;  Location: [place];  Service: Thoracic;  Laterality: N/A;   RETINAL DETACHMENT REPAIR W/ SCLERAL BUCKLE LE  03-10-2011   LEFT EYE   STERNOTOMY N/A 08/07/2017   Procedure: STERNOTOMY;  Surgeon: Ivin Poot, MD;  Location: [place];  Service: Thoracic;  Laterality: N/A;   VENTRICULOPERITONEAL SHUNT  10-08-2002   RIGHT FRONTAL FOR COMMUNICATING HYDROCEPHALUS      Inpatient Medications: Scheduled Meds:  Continuous Infusions:  heparin 900 Units/hr (10/05/21 0957)   PRN Meds:   Allergies:    Allergies  Allergen Reactions   Brilinta [Ticagrelor] Shortness Of Breath   Coreg [Carvedilol] Palpitations and Other (See Comments)    Abdominal bloating   Claritin [Loratadine] Other (See Comments)    Blurred vision and dry eyes   Lipitor [Atorvastatin]     fatigue   Lisinopril Other (See Comments)    "drunk  feeling"    Social History:   Social History   Socioeconomic History   Marital status: Married    Spouse name: Alison Ayala   Number of children: 0   Years of education: Not on file   Highest education level: Not on file  Occupational History   Occupation: retired  Tobacco Use   Smoking status: Never   Smokeless tobacco: Never  Vaping Use   Vaping Use: Never used  Substance and Sexual Activity   Alcohol use: Yes    Alcohol/week: 2.0 standard drinks of alcohol    Types: 2 Glasses of wine per week    Comment: OCCASIONAL   Drug use: No   Sexual activity: Not on file  Other Topics Concern   Not on file  Social History Narrative   Not on file   Social Determinants of Health   Financial Resource Strain: Not on file  Food Insecurity: Not on file  Transportation Needs: Not on file  Physical Activity: Not on file  Stress: Not on file  Social Connections: Not on file  Intimate Partner Violence: Not on file    Family History:   Family History  Problem Relation Age of Onset   Colon cancer Mother 13   Colon polyps Mother    Stomach cancer Maternal Aunt    Breast cancer Maternal Aunt    Uterine cancer Maternal Aunt    Rectal cancer Neg Hx    Esophageal cancer Neg Hx      ROS:  Please see the history of present illness.  All other ROS reviewed and negative.     Physical Exam/Data:   Vitals:   10/05/21 0729 10/05/21 0930 10/05/21 1000 10/05/21 1030  BP:  (!) 141/65 (!) 144/60 (!) 141/64  Pulse:  63 60 62  Resp:  '20 19 18  '$ Temp:      TempSrc:      SpO2:  97% 98% 98%  Weight: 100.7 kg     Height: 5' 3.5" (1.613 m)      No intake or output data in the 24 hours ending 10/05/21 1108    10/05/2021    7:29 AM 08/25/2021    1:49 PM 08/01/2021   11:41 AM  Last 3 Weights  Weight (lbs) 222 lb 228 lb 222 lb  Weight (kg) 100.699 kg 103.42 kg 100.699 kg     Body mass index is 38.71 kg/m.   Physical Exam:   Constitutional: Well-appearing woman in no acute distress   Cardiovascular: No murmurs, rubs, or gallops. Normal rhythm and rate. Peripheral pulses in all four extremities +2 Respiratory: Lungs clear to ausculation    EKG:  The EKG was personally reviewed and demonstrates:  Sinus rhythm with prolonged PR  Laboratory Data:  Chemistry Recent Labs  Lab 10/05/21 0735  NA 141  K 3.8  CL 108  CO2 23  GLUCOSE 134*  BUN 14  CREATININE 0.84  CALCIUM 10.0  GFRNONAA >60  ANIONGAP 10     Hematology Recent Labs  Lab 10/05/21 0735  WBC 4.6  RBC 4.08  HGB 13.1  HCT 39.3  MCV 96.3  MCH 32.1  MCHC 33.3  RDW 13.3  PLT 230    Radiology/Studies:  DG Chest 2 View  Result Date: 10/05/2021 CLINICAL DATA:  Chest pain EXAM: CHEST - 2 VIEW COMPARISON:  07/02/2020 FINDINGS: Normal heart size. Prior median sternotomy. There is no edema, consolidation, effusion, or pneumothorax. A shunt catheter traverses the right chest without visible discontinuity. IMPRESSION: No evidence of active disease. Electronically Signed   By: Jorje Guild M.D.   On: 10/05/2021 07:53     Assessment and Plan:   Unstable Angina:  Due to long-lasting nature of pain, nitroglycerin somewhat helping the pain, and unrelated to exertion, this may be unstable angina. Pt has a history of CAD s/p DES in 2022 in the LAD. Pt is currently having pain, so unable to perform stress test. Chest X-Ray in the emergency department showed no consolidations, and troponin level has been within normal limits. We will proceed with cath today to rule in or out CAD. Plan:  - Obtain echo  - Monitor telemetry  - Start Home medications of Crestor '20mg'$ , Irbesartan '300mg'$ , and Metoprolol '100mg'$  - Plavix '75mg'$  - L Heart Cath today      For questions or updates, please contact [place] Please consult [email or web address] for contact info under     Signed, Drucie Opitz, MD  10/05/2021 11:08 AM    ATTENDING ATTESTATION:  After conducting a review of all available  clinical information  with the care team, interviewing the patient, and performing a physical exam, I agree with the findings and plan described in this note.   GEN: No acute distress.   HEENT:  MMM, no JVD, no scleral icterus Cardiac: RRR, no murmurs, rubs, or gallops.  Respiratory: Clear to auscultation bilaterally. GI: Soft, nontender, non-distended  MS: No edema; No deformity. Neuro:  Nonfocal  Vasc:  +2 radial pulses  The patient is a 76 year old female with a history of hypertension, hyperlipidemia, and coronary artery disease status post ostial LAD stenting in March 2022.  She tells me that she felt well for about a year after her procedure and then developed chest pain.  Her chest pain has several atypical features.  It occurs at rest and with exertion.  The only modifying factor seems to be the use of nitroglycerin which seems to improve the pain but does not resolve it.  It has been occurring for months but worse over the last few weeks.  Her troponins here are negative and her EKG is reassuring.   I reviewed her PCI study from 2022.  A stent was placed in the ostial LAD size to 4.0 mm.  I reviewed the OCT images which demonstrate that the stent struts do extend into the left main.  The stent is otherwise sized very well with no evidence of distal plaque burden which would be a risk factor for edge restenosis.  Given the fact the patient is still endorsing low-level chest pain I do not think a stress test would be able to be performed and may not be helpful particularly given the ostial location of her stent.  If she were to have developed in-stent restenosis or edge restenosis this may involve the left main.  I think the only way to definitively exclude significant disease is to proceed with a coronary angiography study.  If this is reassuring then noncardiac etiologies of chest pain should be evaluated.  She did have a recent upper endoscopy which was relatively reassuring.  The GI notes suggest that she has a  history of a jackhammer esophagus.  Again if her coronary angiography study is reassuring then perhaps evaluating for GI etiology may be important.  I have reviewed the risks, indications, and alternatives to cardiac catheterization, possible angioplasty, and stenting with the patient. Risks include but are not limited to bleeding, infection, vascular injury, stroke, myocardial infection, arrhythmia, kidney injury, radiation-related injury in the case of prolonged fluoroscopy use, emergency cardiac surgery, and death. The patient understands the risks of serious complication is 1-2 in 3428 with diagnostic cardiac cath and 1-2% or less with angioplasty/stenting.    Lenna Sciara, MD Pager [phone]

## 2021-10-06 ENCOUNTER — Encounter (HOSPITAL_COMMUNITY): Payer: Self-pay | Admitting: Cardiovascular Disease

## 2021-10-12 ENCOUNTER — Other Ambulatory Visit: Payer: Self-pay | Admitting: Cardiovascular Disease

## 2021-10-12 NOTE — Progress Notes (Signed)
Cardiology Office Note:    Date:  10/20/2021   ID:  [name], DOB 07-26-1945, MRN [id number]  PCP:  Harlan Stains, MD   [facility] Providers Cardiologist:  Mertie Moores, MD     Referring MD: Harlan Stains, MD   Chief Complaint: follow up chest pain  History of Present Illness:    Alison Ayala is a very pleasant 76 y.o. female with a hx of CAD s/p DES to LAD 04/2020, MVP, HTN, HLD, and chronic HFpEF.   She established care with cardiology and seen by Dr. Acie Fredrickson on 07/23/2017 for preoperative evaluation for surgery to remove mediastinal mass.   Coronary CTA 03/2020 revealed FFR findings consistent with severe stenosis in ostial LAD.  She underwent LHC on 04/08/2020 which revealed severe ostial/proximal LAD stenosis treated successfully with PTCA/DES x1, no obstructive disease in large dominant circumflex artery, small nondominant RCA, normal LV function, no mitral regurgitation, LVEDP normal.  Admission 10/05/2021, presented with chest pain described as intermittent chest tightness for the previous 6 months associated with palpitations, dizziness, and general fatigue.  Describes the pain as squeezing and nonradiating, 5/10 pain. Intermittent dizziness and "moving slower." Reported that she had decreased her sodium intake. Hs troponin negative x 2.  Given concerning symptoms, she underwent cardiac catheterization on 10/05/2021 which revealed 20% ostial left circumflex lesion with widely patent LAD stent with no significant in-stent restenosis. No culprit lesion for symptoms. Mildly elevated LVEDP. It was felt she had noncardiac chest pain with recommendations to continue medical therapy.   Today, she is here alone for follow-up. Reports feeling "crappy" since Spring 2023. Symptoms of abdominal pressure, feels extremely bloated, dizziness, unsteady gait, and fatigued. Sees Dr. Hilarie Fredrickson for GI, has had testing that revealed hiatal hernia but no other significant findings. Gallbladder  function found to be normal. Does not feel like doing anything. Has dizziness when bending forward and coming back up. Sleeping in chair for 4 years due to abdominal discomfort when lying down. Has significantly reduced sodium intake, has lost a few lbs. No junk food. She denies chest pain, shortness of breath, lower extremity edema, palpitations, melena, hematuria, hemoptysis, diaphoresis, presyncope, syncope, orthopnea, and PND.  Past Medical History:  Diagnosis Date   CAD (coronary artery disease)    a. s/p DES to LAD 04/2020.   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Alison Ayala)    Diverticulosis    GERD (gastroesophageal reflux disease)    H/O hiatal hernia    H/O intraventricular hemorrhage 2004--  SPONTANEOUS   S/P SHUNT   Heart murmur    Hiatal hernia    Hip pain, right OCCASIONAL -- PINCHED NERVE   History of gastric ulcer 2001   Hyperlipidemia    Hypertension    Hypoglycemia    Internal hemorrhoids    Interstitial lung disease ([place])    [place] esophagus    Mediastinal mass    Mitral valve prolapse    Normal echocardiogram 30 YRS AGO   NSIP (nonspecific interstitial pneumonia) ([place])    S/P ventriculoperitoneal shunt 2004--- PER PT SHUNT IS WORKING WELL --  NO ISSUES   SAH (subarachnoid hemorrhage) ([facility])    Squamous cell cancer of scalp and skin of neck    mole back of head   Tubular adenoma of colon     Past Surgical History:  Procedure Laterality Date   BILATERAL FRONTAL INTERVENTRICULAR CATHETER VIA TWIST DRILL HOLE  AUG 2004   SPONTANEOUS SUBARACHNOID  HEMORRHAGE AND  INTRAVENTRICULAR HEMORRHAGE   BIOPSY  06/16/2021   Procedure: BIOPSY;  Surgeon: Jerene Bears, MD;  Location: [facility];  Service: Gastroenterology;;   [place]mon [place] RELEASE  07/26/2011   Procedure: CARPAL TUNNEL RELEASE;  Surgeon: Magnus Sinning, MD;  Location: [facility];  Service: Orthopedics;  Laterality: Right;   CATARACT EXTRACTION W/ INTRAOCULAR LENS   IMPLANT, BILATERAL  2012   BILATERAL   COLONOSCOPY WITH PROPOFOL N/A 06/16/2021   Procedure: COLONOSCOPY WITH PROPOFOL;  Surgeon: Jerene Bears, MD;  Location: [facility];  Service: Gastroenterology;  Laterality: N/A;   CORONARY STENT INTERVENTION N/A 04/08/2020   Procedure: CORONARY STENT INTERVENTION;  Surgeon: Burnell Blanks, MD;  Location: [place] [facility];  Service: Cardiovascular;  Laterality: N/A;   DUPUYTREN CONTRACTURE RELEASE  07/26/2011   Procedure: DUPUYTREN CONTRACTURE RELEASE;  Surgeon: Magnus Sinning, MD;  Location: [place];  Service: Orthopedics;  Laterality: Right;  EXCISION OF DUPYTRENS BAND THUMB- INDEX WEB SPACE   ESOPHAGEAL MANOMETRY N/A 01/27/2019   Procedure: ESOPHAGEAL MANOMETRY (EM);  Surgeon: Jerene Bears, MD;  Location: [facility];  Service: Gastroenterology;  Laterality: N/A;   ESOPHAGOGASTRODUODENOSCOPY N/A 06/16/2021   Procedure: ESOPHAGOGASTRODUODENOSCOPY (EGD);  Surgeon: Jerene Bears, MD;  Location: [facility];  Service: Gastroenterology;  Laterality: N/A;   EYE SURGERY Bilateral    scrapped corneas   INTRAVASCULAR IMAGING/OCT N/A 04/08/2020   Procedure: INTRAVASCULAR IMAGING/OCT;  Surgeon: Burnell Blanks, MD;  Location: [place] [facility];  Service: Cardiovascular;  Laterality: N/A;   LEFT HEART CATH AND CORONARY ANGIOGRAPHY N/A 04/08/2020   Procedure: LEFT HEART CATH AND CORONARY ANGIOGRAPHY;  Surgeon: Burnell Blanks, MD;  Location: [place] [facility];  Service: Cardiovascular;  Laterality: N/A;   LEFT HEART CATH AND CORONARY ANGIOGRAPHY N/A 10/05/2021   Procedure: LEFT HEART CATH AND CORONARY ANGIOGRAPHY;  Surgeon: Wellington Hampshire, MD;  Location: [place] [facility];  Service: Cardiovascular;  Laterality: N/A;   LUNG BIOPSY Right 08/07/2017   Procedure: Open LUNG BIOPSY;  Surgeon: Ivin Poot, MD;  Location: [place];  Service: Thoracic;  Laterality: Right;   RESECTION OF MEDIASTINAL MASS N/A 08/07/2017    Procedure: RESECTION OF MEDIASTINAL MASS;  Surgeon: Ivin Poot, MD;  Location: [place];  Service: Thoracic;  Laterality: N/A;   RETINAL DETACHMENT REPAIR W/ SCLERAL BUCKLE LE  03-10-2011   LEFT EYE   STERNOTOMY N/A 08/07/2017   Procedure: STERNOTOMY;  Surgeon: Prescott Gum, Collier Salina, MD;  Location: [place];  Service: Thoracic;  Laterality: N/A;   VENTRICULOPERITONEAL SHUNT  10-08-2002   RIGHT FRONTAL FOR COMMUNICATING HYDROCEPHALUS    Current Medications: Current Meds  Medication Sig   acetaminophen (TYLENOL) 500 MG tablet Take 1-2 tablets (500-1,000 mg total) by mouth 2 (two) times daily as needed for moderate pain or headache.   carboxymethylcellulose (REFRESH PLUS) 0.5 % SOLN Place 1 drop into both eyes in the morning.   clopidogrel (PLAVIX) 75 MG tablet Take 1 tablet (75 mg total) by mouth daily.   furosemide (LASIX) 40 MG tablet Take 1 tablet by mouth every Monday, Wednesday, and Friday   metoprolol succinate (TOPROL-XL) 100 MG 24 hr tablet Take 1 tablet (100 mg total) by mouth daily. Take with or immediately following a meal.   nitroGLYCERIN (NITROSTAT) 0.4 MG SL tablet DISSOLVE 1 TABLET UNDER THE TONGUE ONCE FOR EPIGASTRIC PAIN/CHEST PAIN NOT RELIEVED BY PEPPERMINT ALTOIDS. IF THIS DOES NOT RESOLVE PAIN, CALL DR!   ondansetron (ZOFRAN-ODT) 4  MG disintegrating tablet TAKE 1 TABLET BY MOUTH EVERY 8 HOURS AS NEEDED FOR NAUSEA AND VOMITING   potassium chloride SA (KLOR-CON M20) 20 MEQ tablet Take 1 tablet by mouth on same days as Furosemide (Monday, Wednesday, and Friday)   rosuvastatin (CRESTOR) 20 MG tablet TAKE 1 TABLET BY MOUTH EVERY DAY   valsartan (DIOVAN) 320 MG tablet Take 1 tablet (320 mg total) by mouth daily.     Allergies:   Brilinta [ticagrelor], Coreg [carvedilol], Claritin [loratadine], Lipitor [atorvastatin], and Lisinopril   Social History   Socioeconomic History   Marital status: Married    Spouse name: Don   Number of children: 0   Years of education: Not on file    Highest education level: Not on file  Occupational History   Occupation: retired  Tobacco Use   Smoking status: Never   Smokeless tobacco: Never  Vaping Use   Vaping Use: Never used  Substance and Sexual Activity   Alcohol use: Yes    Alcohol/week: 2.0 standard drinks of alcohol    Types: 2 Glasses of wine per week    Comment: OCCASIONAL   Drug use: No   Sexual activity: Not on file  Other Topics Concern   Not on file  Social History Narrative   Not on file   Social Determinants of Health   Financial Resource Strain: Not on file  Food Insecurity: Not on file  Transportation Needs: Not on file  Physical Activity: Not on file  Stress: Not on file  Social Connections: Not on file     Family History: The patient's family history includes Breast cancer in her maternal aunt; Colon cancer (age of onset: 18) in her mother; Colon polyps in her mother; Stomach cancer in her maternal aunt; Uterine cancer in her maternal aunt. There is no history of Rectal cancer or Esophageal cancer.  ROS:   Please see the history of present illness.    + abdominal bloating + fatigue + bendopnea All other systems reviewed and are negative.  Labs/Other Studies Reviewed:    The following studies were reviewed today:  LHC 10/05/21    Ost Cx lesion is 20% stenosed.   Non-stenotic Ost LAD to Prox LAD lesion was previously treated.   1.  Widely patent ostial LAD stent with no significant restenosis.  The stent extends 1 to 2 mm into the left main with partial jailing of the left circumflex.  However, there is only mild ostial left circumflex stenosis.  The coronary arteries are otherwise normal. 2.  Left ventricular angiography was not performed.  LVEDP was mildly elevated.   Recommendations: Suspect noncardiac chest pain. Continue medical therapy.  Echo 07/03/20   1. Left ventricular ejection fraction, by estimation, is 60 to 65%. The  left ventricle has normal function. The left ventricle  has no regional  wall motion abnormalities. There is moderate left ventricular hypertrophy  of the basal-septal segment. Left  ventricular diastolic parameters are consistent with Grade I diastolic  dysfunction (impaired relaxation).   2. Right ventricular systolic function is normal. The right ventricular  size is normal. There is mildly elevated pulmonary artery systolic  pressure. The estimated right ventricular systolic pressure is 62.5 mmHg.   3. The mitral valve is normal in structure. Mild mitral valve  regurgitation. No evidence of mitral stenosis.   4. The aortic valve is normal in structure. There is mild calcification  of the aortic valve. There is mild thickening of the aortic valve. Aortic  valve  regurgitation is mild to moderate. Mild to moderate aortic valve  sclerosis/calcification is present,  without any evidence of aortic stenosis. Aortic valve mean gradient  measures 6.0 mmHg. Aortic valve Vmax measures 1.65 m/s.   5. The inferior vena cava is normal in size with greater than 50%  respiratory variability, suggesting right atrial pressure of 3 mmHg.   LHC 04/08/20  Ost LAD to Prox LAD lesion is 99% stenosed. A drug-eluting stent was successfully placed using a SYNERGY XD 4.0X16. Post intervention, there is a 0% residual stenosis. The left ventricular systolic function is normal. LV end diastolic pressure is normal. The left ventricular ejection fraction is 55-65% by visual estimate. There is no mitral valve regurgitation.   1. Severe ostial/proximal LAD stenosis 2. Successful PTCA/DES x 1 ostial/proximal LAD with optimization with OCT imaging 3. No obstructive disease in the large dominant Circumflex artery.  4. Small non-dominant RCA 5. Normal LV systolic function   Recommendations: Continue DAPT with ASA and Brilinta for at least six months but preferably one year if she tolerates it well. If necessary, the Brilinta could be changed to Plavix in 3 months. Same  day post PCI discharge.     Recent Labs: 10/05/2021: BUN 14; Creatinine, Ser 0.84; Hemoglobin 13.1; Platelets 230; Potassium 3.8; Sodium 141  Recent Lipid Panel    Component Value Date/Time   CHOL 159 07/28/2020 0852   TRIG 103 07/28/2020 0852   HDL 48 07/28/2020 0852   CHOLHDL 3.3 07/28/2020 0852   LDLCALC 92 07/28/2020 0852     Risk Assessment/Calculations:      Physical Exam:    VS:  BP 128/70   Pulse 75   Ht 5' 3.5" (1.613 m)   Wt 221 lb 9.6 oz (100.5 kg)   SpO2 95%   BMI 38.64 kg/m     Wt Readings from Last 3 Encounters:  10/20/21 221 lb 9.6 oz (100.5 kg)  10/05/21 222 lb (100.7 kg)  08/25/21 228 lb (103.4 kg)     GEN:  Well nourished, well developed in no acute distress HEENT: Normal NECK: No JVD; No carotid bruits CARDIAC: RRR, no murmurs, rubs, gallops RESPIRATORY:  Clear to auscultation without rales, wheezing or rhonchi  ABDOMEN: Soft, non-tender. Distended, + bowel sounds MUSCULOSKELETAL:  No edema; No deformity. 2+ pedal pulses, equal bilaterally SKIN: Warm and dry NEUROLOGIC:  Alert and oriented x 3 PSYCHIATRIC:  Normal affect   EKG:  EKG is not ordered today.    Diagnoses:    1. Coronary artery disease involving native coronary artery of native heart without angina pectoris   2. Hyperlipidemia LDL goal <70   3. Chronic heart failure with preserved ejection fraction (HFpEF) ([place])   4. Essential hypertension   5. Nonrheumatic aortic valve insufficiency   6. Abdominal bloating    Assessment and Plan:     CAD without angina: Most recent cath 10/05/2021 with widely patent ostial LAD stent with no significant restenosis.  Stent extends 1 to 2 mm into the left main with partial jailing of the left circumflex, however there is only mild ostial left circumflex stenosis.  Coronary arteries otherwise normal, LVEDP mildly elevated.  Abdominal bloating: Significant abdominal bloating for at least 6 months. Imaging has been unrevealing. No vaginal  discharge, no bleeding.  Recommended that she contact her gynecologist for evaluation of pelvic organs as I do not see a pelvic u/s or CT.   Chronic HFpEF: Mildly elevated LVEDP on cath 10/05/2021. Has abdominal distention and bendopnea. No  leg edema, orthopnea, or PND. Has been sleeping in a recliner for the past 4 years. Recommended continued weight loss and heart healthy diet.   Aortic insufficiency: Mild to moderate aortic valve regurgitation on echo 06/2020.  Soft murmur on exam. We will continue to follow clinically for now.   Hypertension: BP is well-controlled. Has not been monitoring consistently, agrees to monitor more consistently at home.  No medication changes today.   Hyperlipidemia  LDL goal < 70: LDL 81 on 07/18/21. Reports she has improved her diet. Shared information on Mediterranean diet with her. Advised goal of LDL less than 70.  Continue rosuvastatin.      Disposition: Keep your 3 month appointment with me  Medication Adjustments/Labs and Tests Ordered: Current medicines are reviewed at length with the patient today.  Concerns regarding medicines are outlined above.  No orders of the defined types were placed in this encounter.  No orders of the defined types were placed in this encounter.   Patient Instructions  Medication Instructions:   Your physician recommends that you continue on your current medications as directed. Please refer to the Current Medication list given to you today.   *If you need a refill on your cardiac medications before your next appointment, please call your pharmacy*   Lab Work:  None ordered.  If you have labs (blood work) drawn today and your tests are completely normal, you will receive your results only by: [place] (if you have MyChart) OR A paper copy in the mail If you have any lab test that is abnormal or we need to change your treatment, we will call you to review the results.   Testing/Procedures:  None  ordered.   Follow-Up: At [facility], you and your health needs are our priority.  As part of our continuing mission to provide you with exceptional heart care, we have created designated Provider Care Teams.  These Care Teams include your primary Cardiologist (physician) and Advanced Practice Providers (APPs -  Physician Assistants and Nurse Practitioners) who all work together to provide you with the care you need, when you need it.  We recommend signing up for the patient portal called "MyChart".  Sign up information is provided on this After Visit Summary.  MyChart is used to connect with patients for Virtual Visits (Telemedicine).  Patients are able to view lab/test results, encounter notes, upcoming appointments, etc.  Non-urgent messages can be sent to your provider as well.   To learn more about what you can do with MyChart, go to [email or web address].    Your next appointment:   3 month(s)  The format for your next appointment:   In Person  Provider:   Christen Bame, NP         Other Instructions  Please call your gynecologist at [phone] symptoms of bloating.   Mediterranean Diet A Mediterranean diet refers to food and lifestyle choices that are based on the traditions of countries located on the [employer]. It focuses on eating more fruits, vegetables, whole grains, beans, nuts, seeds, and heart-healthy fats, and eating less dairy, meat, eggs, and processed foods with added sugar, salt, and fat. This way of eating has been shown to help prevent certain conditions and improve outcomes for people who have chronic diseases, like kidney disease and heart disease. What are tips for following this plan? Reading food labels Check the serving size of packaged foods. For foods such as rice and pasta, the serving size refers to the amount  of cooked product, not dry. Check the total fat in packaged foods. Avoid foods that have saturated fat or trans fats. Check  the ingredient list for added sugars, such as corn syrup. Shopping  Buy a variety of foods that offer a balanced diet, including: Fresh fruits and vegetables (produce). Grains, beans, nuts, and seeds. Some of these may be available in unpackaged forms or large amounts (in bulk). Fresh seafood. Poultry and eggs. Low-fat dairy products. Buy whole ingredients instead of prepackaged foods. Buy fresh fruits and vegetables in-season from local farmers markets. Buy plain frozen fruits and vegetables. If you do not have access to quality fresh seafood, buy precooked frozen shrimp or canned fish, such as tuna, salmon, or sardines. Stock your pantry so you always have certain foods on hand, such as olive oil, canned tuna, canned tomatoes, rice, pasta, and beans. Cooking Cook foods with extra-virgin olive oil instead of using butter or other vegetable oils. Have meat as a side dish, and have vegetables or grains as your main dish. This means having meat in small portions or adding small amounts of meat to foods like pasta or stew. Use beans or vegetables instead of meat in common dishes like chili or lasagna. Experiment with different cooking methods. Try roasting, broiling, steaming, and sauting vegetables. Add frozen vegetables to soups, stews, pasta, or rice. Add nuts or seeds for added healthy fats and plant protein at each meal. You can add these to yogurt, salads, or vegetable dishes. Marinate fish or vegetables using olive oil, lemon juice, garlic, and fresh herbs. Meal planning Plan to eat one vegetarian meal one day each week. Try to work up to two vegetarian meals, if possible. Eat seafood two or more times a week. Have healthy snacks readily available, such as: Vegetable sticks with hummus. Greek yogurt. Fruit and nut trail mix. Eat balanced meals throughout the week. This includes: Fruit: 2-3 servings a day. Vegetables: 4-5 servings a day. Low-fat dairy: 2 servings a day. Fish,  poultry, or lean meat: 1 serving a day. Beans and legumes: 2 or more servings a week. Nuts and seeds: 1-2 servings a day. Whole grains: 6-8 servings a day. Extra-virgin olive oil: 3-4 servings a day. Limit red meat and sweets to only a few servings a month. Lifestyle  Cook and eat meals together with your family, when possible. Drink enough fluid to keep your urine pale yellow. Be physically active every day. This includes: Aerobic exercise like running or swimming. Leisure activities like gardening, walking, or housework. Get 7-8 hours of sleep each night. If recommended by your health care provider, drink red wine in moderation. This means 1 glass a day for nonpregnant women and 2 glasses a day for men. A glass of wine equals 5 oz (150 mL). What foods should I eat? Fruits Apples. Apricots. Avocado. Berries. Bananas. Cherries. Dates. Figs. Grapes. Lemons. Melon. Oranges. Peaches. Plums. Pomegranate. Vegetables Artichokes. Beets. Broccoli. Cabbage. Carrots. Eggplant. Green beans. Chard. Kale. Spinach. Onions. Leeks. Peas. Squash. Tomatoes. Peppers. Radishes. Grains Whole-grain pasta. Brown rice. Bulgur wheat. Polenta. Couscous. Whole-wheat bread. [place]. Meats and other proteins Beans. Almonds. Sunflower seeds. Pine nuts. Peanuts. [place]. Salmon. Scallops. Shrimp. [place]. Tilapia. Clams. Oysters. Eggs. Poultry without skin. Dairy Low-fat milk. Cheese. Greek yogurt. Fats and oils Extra-virgin olive oil. Avocado oil. Grapeseed oil. Beverages Water. Red wine. Herbal tea. Sweets and desserts Greek yogurt with honey. Baked apples. Poached pears. Trail mix. Seasonings and condiments Basil. Cilantro. Coriander. Cumin. Mint. Parsley. Sage. Rosemary. Tarragon. Garlic.  Oregano. Thyme. Pepper. Balsamic vinegar. Tahini. Hummus. Tomato sauce. Olives. Mushrooms. The items listed above may not be a complete list of foods and beverages you can eat. Contact a dietitian for more  information. What foods should I limit? This is a list of foods that should be eaten rarely or only on special occasions. Fruits Fruit canned in syrup. Vegetables Deep-fried potatoes (french fries). Grains Prepackaged pasta or rice dishes. Prepackaged cereal with added sugar. Prepackaged snacks with added sugar. Meats and other proteins Beef. Pork. Lamb. Poultry with skin. Hot dogs. [place] Salines. Dairy Ice cream. Sour cream. Whole milk. Fats and oils Butter. Canola oil. Vegetable oil. Beef fat (tallow). Lard. Beverages Juice. Sugar-sweetened soft drinks. Beer. Liquor and spirits. Sweets and desserts Cookies. Cakes. Pies. Candy. Seasonings and condiments Mayonnaise. Pre-made sauces and marinades. The items listed above may not be a complete list of foods and beverages you should limit. Contact a dietitian for more information. Summary The Mediterranean diet includes both food and lifestyle choices. Eat a variety of fresh fruits and vegetables, beans, nuts, seeds, and whole grains. Limit the amount of red meat and sweets that you eat. If recommended by your health care provider, drink red wine in moderation. This means 1 glass a day for nonpregnant women and 2 glasses a day for men. A glass of wine equals 5 oz (150 mL). This information is not intended to replace advice given to you by your health care provider. Make sure you discuss any questions you have with your health care provider. Document Revised: 02/28/2019 Document Reviewed: 12/26/2018 Elsevier Patient Education  [place]         Signed, Emmaline Life, NP  10/20/2021 4:30 PM    [place]

## 2021-10-20 ENCOUNTER — Ambulatory Visit: Payer: Medicare HMO | Attending: Nurse Practitioner | Admitting: Nurse Practitioner

## 2021-10-20 ENCOUNTER — Encounter: Payer: Self-pay | Admitting: Nurse Practitioner

## 2021-10-20 VITALS — BP 128/70 | HR 75 | Ht 63.5 in | Wt 221.6 lb

## 2021-10-20 DIAGNOSIS — R140 Abdominal distension (gaseous): Secondary | ICD-10-CM

## 2021-10-20 DIAGNOSIS — I5032 Chronic diastolic (congestive) heart failure: Secondary | ICD-10-CM | POA: Diagnosis not present

## 2021-10-20 DIAGNOSIS — I351 Nonrheumatic aortic (valve) insufficiency: Secondary | ICD-10-CM

## 2021-10-20 DIAGNOSIS — E785 Hyperlipidemia, unspecified: Secondary | ICD-10-CM

## 2021-10-20 DIAGNOSIS — I10 Essential (primary) hypertension: Secondary | ICD-10-CM | POA: Diagnosis not present

## 2021-10-20 DIAGNOSIS — I2510 Atherosclerotic heart disease of native coronary artery without angina pectoris: Secondary | ICD-10-CM

## 2021-10-20 NOTE — Patient Instructions (Signed)
Medication Instructions:   Your physician recommends that you continue on your current medications as directed. Please refer to the Current Medication list given to you today.   *If you need a refill on your cardiac medications before your next appointment, please call your pharmacy*   Lab Work:  None ordered.  If you have labs (blood work) drawn today and your tests are completely normal, you will receive your results only by: [place] (if you have MyChart) OR A paper copy in the mail If you have any lab test that is abnormal or we need to change your treatment, we will call you to review the results.   Testing/Procedures:  None ordered.   Follow-Up: At [facility], you and your health needs are our priority.  As part of our continuing mission to provide you with exceptional heart care, we have created designated Provider Care Teams.  These Care Teams include your primary Cardiologist (physician) and Advanced Practice Providers (APPs -  Physician Assistants and Nurse Practitioners) who all work together to provide you with the care you need, when you need it.  We recommend signing up for the patient portal called "MyChart".  Sign up information is provided on this After Visit Summary.  MyChart is used to connect with patients for Virtual Visits (Telemedicine).  Patients are able to view lab/test results, encounter notes, upcoming appointments, etc.  Non-urgent messages can be sent to your provider as well.   To learn more about what you can do with MyChart, go to [email or web address].    Your next appointment:   3 month(s)  The format for your next appointment:   In Person  Provider:   Christen Bame, NP         Other Instructions  Please call your gynecologist at [phone] symptoms of bloating.   Mediterranean Diet A Mediterranean diet refers to food and lifestyle choices that are based on the traditions of countries located on the [place]. It focuses on eating more fruits, vegetables, whole grains, beans, nuts, seeds, and heart-healthy fats, and eating less dairy, meat, eggs, and processed foods with added sugar, salt, and fat. This way of eating has been shown to help prevent certain conditions and improve outcomes for people who have chronic diseases, like kidney disease and heart disease. What are tips for following this plan? Reading food labels Check the serving size of packaged foods. For foods such as rice and pasta, the serving size refers to the amount of cooked product, not dry. Check the total fat in packaged foods. Avoid foods that have saturated fat or trans fats. Check the ingredient list for added sugars, such as corn syrup. Shopping  Buy a variety of foods that offer a balanced diet, including: Fresh fruits and vegetables (produce). Grains, beans, nuts, and seeds. Some of these may be available in unpackaged forms or large amounts (in bulk). Fresh seafood. Poultry and eggs. Low-fat dairy products. Buy whole ingredients instead of prepackaged foods. Buy fresh fruits and vegetables in-season from local farmers markets. Buy plain frozen fruits and vegetables. If you do not have access to quality fresh seafood, buy precooked frozen shrimp or canned fish, such as tuna, salmon, or sardines. Stock your pantry so you always have certain foods on hand, such as olive oil, canned tuna, canned tomatoes, rice, pasta, and beans. Cooking Cook foods with extra-virgin olive oil instead of using butter or other vegetable oils. Have meat as a side dish, and have vegetables or grains as  your main dish. This means having meat in small portions or adding small amounts of meat to foods like pasta or stew. Use beans or vegetables instead of meat in common dishes like chili or lasagna. Experiment with different cooking methods. Try roasting, broiling, steaming, and sauting vegetables. Add frozen vegetables to soups, stews, pasta,  or rice. Add nuts or seeds for added healthy fats and plant protein at each meal. You can add these to yogurt, salads, or vegetable dishes. Marinate fish or vegetables using olive oil, lemon juice, garlic, and fresh herbs. Meal planning Plan to eat one vegetarian meal one day each week. Try to work up to two vegetarian meals, if possible. Eat seafood two or more times a week. Have healthy snacks readily available, such as: Vegetable sticks with hummus. Greek yogurt. Fruit and nut trail mix. Eat balanced meals throughout the week. This includes: Fruit: 2-3 servings a day. Vegetables: 4-5 servings a day. Low-fat dairy: 2 servings a day. Fish, poultry, or lean meat: 1 serving a day. Beans and legumes: 2 or more servings a week. Nuts and seeds: 1-2 servings a day. Whole grains: 6-8 servings a day. Extra-virgin olive oil: 3-4 servings a day. Limit red meat and sweets to only a few servings a month. Lifestyle  Cook and eat meals together with your family, when possible. Drink enough fluid to keep your urine pale yellow. Be physically active every day. This includes: Aerobic exercise like running or swimming. Leisure activities like gardening, walking, or housework. Get 7-8 hours of sleep each night. If recommended by your health care provider, drink red wine in moderation. This means 1 glass a day for nonpregnant women and 2 glasses a day for men. A glass of wine equals 5 oz (150 mL). What foods should I eat? Fruits Apples. Apricots. Avocado. Berries. Bananas. Cherries. Dates. Figs. Grapes. Lemons. Melon. Oranges. Peaches. Plums. Pomegranate. Vegetables Artichokes. Beets. Broccoli. Cabbage. Carrots. Eggplant. Green beans. Chard. Kale. Spinach. Onions. Leeks. Peas. Squash. Tomatoes. Peppers. Radishes. Grains Whole-grain pasta. Brown rice. Bulgur wheat. Polenta. Couscous. Whole-wheat bread. [place]. Meats and other proteins Beans. Almonds. Sunflower seeds. Pine nuts. Peanuts.  [place]. Salmon. Scallops. Shrimp. [place]. Tilapia. Clams. Oysters. Eggs. Poultry without skin. Dairy Low-fat milk. Cheese. Greek yogurt. Fats and oils Extra-virgin olive oil. Avocado oil. Grapeseed oil. Beverages Water. Red wine. Herbal tea. Sweets and desserts Greek yogurt with honey. Baked apples. Poached pears. Trail mix. Seasonings and condiments Basil. Cilantro. Coriander. Cumin. Mint. Parsley. Sage. Rosemary. Tarragon. Garlic. Oregano. Thyme. Pepper. Balsamic vinegar. Tahini. Hummus. Tomato sauce. Olives. Mushrooms. The items listed above may not be a complete list of foods and beverages you can eat. Contact a dietitian for more information. What foods should I limit? This is a list of foods that should be eaten rarely or only on special occasions. Fruits Fruit canned in syrup. Vegetables Deep-fried potatoes (french fries). Grains Prepackaged pasta or rice dishes. Prepackaged cereal with added sugar. Prepackaged snacks with added sugar. Meats and other proteins Beef. Pork. Lamb. Poultry with skin. Hot dogs. [place] Salines. Dairy Ice cream. Sour cream. Whole milk. Fats and oils Butter. Canola oil. Vegetable oil. Beef fat (tallow). Lard. Beverages Juice. Sugar-sweetened soft drinks. Beer. Liquor and spirits. Sweets and desserts Cookies. Cakes. Pies. Candy. Seasonings and condiments Mayonnaise. Pre-made sauces and marinades. The items listed above may not be a complete list of foods and beverages you should limit. Contact a dietitian for more information. Summary The Mediterranean diet includes both food and lifestyle choices. Eat a variety  of fresh fruits and vegetables, beans, nuts, seeds, and whole grains. Limit the amount of red meat and sweets that you eat. If recommended by your health care provider, drink red wine in moderation. This means 1 glass a day for nonpregnant women and 2 glasses a day for men. A glass of wine equals 5 oz (150 mL). This information is not intended to  replace advice given to you by your health care provider. Make sure you discuss any questions you have with your health care provider. Document Revised: 02/28/2019 Document Reviewed: 12/26/2018 [employer] Patient Education  [place]

## 2021-11-02 ENCOUNTER — Other Ambulatory Visit: Payer: Self-pay | Admitting: Cardiovascular Disease

## 2021-11-16 DIAGNOSIS — R300 Dysuria: Secondary | ICD-10-CM | POA: Diagnosis not present

## 2021-11-16 DIAGNOSIS — Z01419 Encounter for gynecological examination (general) (routine) without abnormal findings: Secondary | ICD-10-CM | POA: Diagnosis not present

## 2021-11-16 NOTE — Telephone Encounter (Signed)
error 

## 2021-12-23 DIAGNOSIS — R079 Chest pain, unspecified: Secondary | ICD-10-CM | POA: Diagnosis not present

## 2021-12-23 DIAGNOSIS — R21 Rash and other nonspecific skin eruption: Secondary | ICD-10-CM | POA: Diagnosis not present

## 2021-12-23 DIAGNOSIS — F3341 Major depressive disorder, recurrent, in partial remission: Secondary | ICD-10-CM | POA: Diagnosis not present

## 2021-12-23 DIAGNOSIS — B372 Candidiasis of skin and nail: Secondary | ICD-10-CM | POA: Diagnosis not present

## 2021-12-23 DIAGNOSIS — Z23 Encounter for immunization: Secondary | ICD-10-CM | POA: Diagnosis not present

## 2021-12-23 DIAGNOSIS — I10 Essential (primary) hypertension: Secondary | ICD-10-CM | POA: Diagnosis not present

## 2021-12-23 DIAGNOSIS — R7303 Prediabetes: Secondary | ICD-10-CM | POA: Diagnosis not present

## 2021-12-23 DIAGNOSIS — I5032 Chronic diastolic (congestive) heart failure: Secondary | ICD-10-CM | POA: Diagnosis not present

## 2022-01-02 DIAGNOSIS — I10 Essential (primary) hypertension: Secondary | ICD-10-CM | POA: Diagnosis not present

## 2022-01-02 DIAGNOSIS — E559 Vitamin D deficiency, unspecified: Secondary | ICD-10-CM | POA: Diagnosis not present

## 2022-01-02 DIAGNOSIS — E785 Hyperlipidemia, unspecified: Secondary | ICD-10-CM | POA: Diagnosis not present

## 2022-01-02 DIAGNOSIS — I2510 Atherosclerotic heart disease of native coronary artery without angina pectoris: Secondary | ICD-10-CM | POA: Diagnosis not present

## 2022-01-02 DIAGNOSIS — E538 Deficiency of other specified B group vitamins: Secondary | ICD-10-CM | POA: Diagnosis not present

## 2022-01-02 DIAGNOSIS — K219 Gastro-esophageal reflux disease without esophagitis: Secondary | ICD-10-CM | POA: Diagnosis not present

## 2022-01-02 DIAGNOSIS — Z6838 Body mass index (BMI) 38.0-38.9, adult: Secondary | ICD-10-CM | POA: Diagnosis not present

## 2022-01-02 DIAGNOSIS — R7303 Prediabetes: Secondary | ICD-10-CM | POA: Diagnosis not present

## 2022-01-05 DIAGNOSIS — I5032 Chronic diastolic (congestive) heart failure: Secondary | ICD-10-CM | POA: Diagnosis not present

## 2022-01-05 DIAGNOSIS — I10 Essential (primary) hypertension: Secondary | ICD-10-CM | POA: Diagnosis not present

## 2022-01-05 DIAGNOSIS — M810 Age-related osteoporosis without current pathological fracture: Secondary | ICD-10-CM | POA: Diagnosis not present

## 2022-01-05 DIAGNOSIS — K219 Gastro-esophageal reflux disease without esophagitis: Secondary | ICD-10-CM | POA: Diagnosis not present

## 2022-01-05 DIAGNOSIS — E785 Hyperlipidemia, unspecified: Secondary | ICD-10-CM | POA: Diagnosis not present

## 2022-01-05 DIAGNOSIS — I2510 Atherosclerotic heart disease of native coronary artery without angina pectoris: Secondary | ICD-10-CM | POA: Diagnosis not present

## 2022-01-05 DIAGNOSIS — F3341 Major depressive disorder, recurrent, in partial remission: Secondary | ICD-10-CM | POA: Diagnosis not present

## 2022-01-22 NOTE — Progress Notes (Unsigned)
Cardiology Office Note:    Date:  01/26/2022   ID:  Alison Ayala, DOB 1945/12/04, MRN [id number]  PCP:  Harlan Stains, MD   [facility] Providers Cardiologist:  Mertie Moores, MD     Referring MD: Harlan Stains, MD   Chief Complaint: follow up chest pain  History of Present Illness:    Alison Ayala is a very pleasant 76 y.o. female with a hx of CAD s/p DES to LAD 04/2020, MVP, HTN, HLD, and chronic HFpEF.   She established care with cardiology and seen by Dr. Acie Fredrickson on 07/23/2017 for preoperative evaluation for surgery to remove mediastinal mass.   Coronary CTA 03/2020 revealed FFR findings consistent with severe stenosis in ostial LAD.  She underwent LHC on 04/08/2020 which revealed severe ostial/proximal LAD stenosis treated successfully with PTCA/DES x1, no obstructive disease in large dominant circumflex artery, small nondominant RCA, normal LV function, no mitral regurgitation, LVEDP normal.  Admission 10/05/2021, presented with chest pain described as intermittent chest tightness for the previous 6 months associated with palpitations, dizziness, and general fatigue.  Describes the pain as squeezing and nonradiating, 5/10 pain. Intermittent dizziness and "moving slower." Reported that she had decreased her sodium intake. Hs troponin negative x 2.  Given concerning symptoms, she underwent cardiac catheterization on 10/05/2021 which revealed 20% ostial left circumflex lesion with widely patent LAD stent with no significant in-stent restenosis. No culprit lesion for symptoms. Mildly elevated LVEDP. It was felt she had noncardiac chest pain with recommendations to continue medical therapy.   Seen by me on 10/20/21 for follow-up. Reported feeling "crappy" since Spring 2023. Symptoms of abdominal pressure, feels extremely bloated, dizziness, unsteady gait, and fatigued. Sees Dr. Hilarie Fredrickson for GI, has had testing that revealed hiatal hernia but no other significant findings. Gallbladder  function found to be normal. Does not feel like doing anything. Has dizziness when bending forward and coming back up. Sleeping in chair for 4 years due to abdominal discomfort when lying down. Has significantly reduced sodium intake, has lost a few lbs. No junk food. Denied chest pain, shortness of breath, lower extremity edema, palpitations, melena, hematuria, hemoptysis, diaphoresis, presyncope, syncope, orthopnea, and PND. I recommended that she follow-up with gyn for abdominal bloating.   Today, she is here for follow-up. Reports PCP, Dr. Dema Severin, started her on Wellbutrin and her abdominal and chest pain has significantly improved after 3 weeks on that medication.  Continues to have dyspnea with exertion but thinks it might be slightly better.  No chest pain, orthopnea, PND.  Only has mild edema if she holds Lasix due to activities, needs to be away from bathroom. She did see gyn and she had no abnormal findings on that exam. Feels less abdominal bloating recently.  No lightheadedness, presyncope, syncope.  Past Medical History:  Diagnosis Date   CAD (coronary artery disease)    a. s/p DES to LAD 04/2020.   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[place])    Diverticulosis    GERD (gastroesophageal reflux disease)    H/O hiatal hernia    H/O intraventricular hemorrhage 2004--  SPONTANEOUS   S/P SHUNT   Heart murmur    Hiatal hernia    Hip pain, right OCCASIONAL -- PINCHED NERVE   History of gastric ulcer 2001   Hyperlipidemia    Hypertension    Hypoglycemia    Internal hemorrhoids    Interstitial lung disease ([place])    [place]hammer esophagus    Mediastinal  mass    Mitral valve prolapse    Normal echocardiogram 30 YRS AGO   NSIP (nonspecific interstitial pneumonia) (HCC)    S/P ventriculoperitoneal shunt 2004--- PER PT SHUNT IS WORKING WELL --  NO ISSUES   SAH (subarachnoid hemorrhage) (HCC)    Squamous cell cancer of scalp and skin of neck    mole back of head    Tubular adenoma of colon     Past Surgical History:  Procedure Laterality Date   BILATERAL FRONTAL INTERVENTRICULAR CATHETER VIA TWIST DRILL HOLE  AUG 2004   SPONTANEOUS SUBARACHNOID  HEMORRHAGE AND  INTRAVENTRICULAR HEMORRHAGE   BIOPSY  06/16/2021   Procedure: BIOPSY;  Surgeon: Jerene Bears, MD;  Location: [facility];  Service: Gastroenterology;;   Wilmon Pali RELEASE  07/26/2011   Procedure: CARPAL TUNNEL RELEASE;  Surgeon: Magnus Sinning, MD;  Location: [place];  Service: Orthopedics;  Laterality: Right;   CATARACT EXTRACTION W/ INTRAOCULAR LENS  IMPLANT, BILATERAL  2012   BILATERAL   COLONOSCOPY WITH PROPOFOL N/A 06/16/2021   Procedure: COLONOSCOPY WITH PROPOFOL;  Surgeon: Jerene Bears, MD;  Location: [facility];  Service: Gastroenterology;  Laterality: N/A;   CORONARY STENT INTERVENTION N/A 04/08/2020   Procedure: CORONARY STENT INTERVENTION;  Surgeon: Burnell Blanks, MD;  Location: [place] [facility];  Service: Cardiovascular;  Laterality: N/A;   DUPUYTREN CONTRACTURE RELEASE  07/26/2011   Procedure: DUPUYTREN CONTRACTURE RELEASE;  Surgeon: Magnus Sinning, MD;  Location: [place];  Service: Orthopedics;  Laterality: Right;  EXCISION OF DUPYTRENS BAND THUMB- INDEX WEB SPACE   ESOPHAGEAL MANOMETRY N/A 01/27/2019   Procedure: ESOPHAGEAL MANOMETRY (EM);  Surgeon: Jerene Bears, MD;  Location: [facility];  Service: Gastroenterology;  Laterality: N/A;   ESOPHAGOGASTRODUODENOSCOPY N/A 06/16/2021   Procedure: ESOPHAGOGASTRODUODENOSCOPY (EGD);  Surgeon: Jerene Bears, MD;  Location: [facility];  Service: Gastroenterology;  Laterality: N/A;   EYE SURGERY Bilateral    scrapped corneas   INTRAVASCULAR IMAGING/OCT N/A 04/08/2020   Procedure: INTRAVASCULAR IMAGING/OCT;  Surgeon: Burnell Blanks, MD;  Location: [place] [facility];  Service: Cardiovascular;  Laterality: N/A;   LEFT HEART CATH AND CORONARY ANGIOGRAPHY N/A 04/08/2020    Procedure: LEFT HEART CATH AND CORONARY ANGIOGRAPHY;  Surgeon: Burnell Blanks, MD;  Location: [place] [facility];  Service: Cardiovascular;  Laterality: N/A;   LEFT HEART CATH AND CORONARY ANGIOGRAPHY N/A 10/05/2021   Procedure: LEFT HEART CATH AND CORONARY ANGIOGRAPHY;  Surgeon: Wellington Hampshire, MD;  Location: [place] [facility];  Service: Cardiovascular;  Laterality: N/A;   LUNG BIOPSY Right 08/07/2017   Procedure: Open LUNG BIOPSY;  Surgeon: Ivin Poot, MD;  Location: [place];  Service: Thoracic;  Laterality: Right;   RESECTION OF MEDIASTINAL MASS N/A 08/07/2017   Procedure: RESECTION OF MEDIASTINAL MASS;  Surgeon: Ivin Poot, MD;  Location: [place];  Service: Thoracic;  Laterality: N/A;   RETINAL DETACHMENT REPAIR W/ SCLERAL BUCKLE LE  03-10-2011   LEFT EYE   STERNOTOMY N/A 08/07/2017   Procedure: STERNOTOMY;  Surgeon: Prescott Gum, Collier Salina, MD;  Location: [place];  Service: Thoracic;  Laterality: N/A;   VENTRICULOPERITONEAL SHUNT  10-08-2002   RIGHT FRONTAL FOR COMMUNICATING HYDROCEPHALUS    Current Medications: Current Meds  Medication Sig   acetaminophen (TYLENOL) 500 MG tablet Take 1-2 tablets (500-1,000 mg total) by mouth 2 (two) times daily as needed for moderate pain or headache.   buPROPion (WELLBUTRIN XL) 300 MG 24 hr tablet Take  300 mg by mouth every morning.   carboxymethylcellulose (REFRESH PLUS) 0.5 % SOLN Place 1 drop into both eyes in the morning.   clopidogrel (PLAVIX) 75 MG tablet TAKE 1 TABLET BY MOUTH EVERY DAY   furosemide (LASIX) 40 MG tablet Take 1 tablet by mouth every Monday, Wednesday, and Friday   metoprolol succinate (TOPROL-XL) 100 MG 24 hr tablet Take 1 tablet (100 mg total) by mouth daily. Take with or immediately following a meal.   nitroGLYCERIN (NITROSTAT) 0.4 MG SL tablet DISSOLVE 1 TABLET UNDER THE TONGUE ONCE FOR EPIGASTRIC PAIN/CHEST PAIN NOT RELIEVED BY PEPPERMINT ALTOIDS. IF THIS DOES NOT RESOLVE PAIN, CALL DR!   ondansetron (ZOFRAN-ODT) 4 MG  disintegrating tablet TAKE 1 TABLET BY MOUTH EVERY 8 HOURS AS NEEDED FOR NAUSEA AND VOMITING   potassium chloride SA (KLOR-CON M20) 20 MEQ tablet Take 1 tablet by mouth on same days as Furosemide (Monday, Wednesday, and Friday)   rosuvastatin (CRESTOR) 20 MG tablet TAKE 1 TABLET BY MOUTH EVERY DAY   valsartan (DIOVAN) 320 MG tablet Take 1 tablet (320 mg total) by mouth daily.     Allergies:   Brilinta [ticagrelor], Coreg [carvedilol], Claritin [loratadine], Lipitor [atorvastatin], and Lisinopril   Social History   Socioeconomic History   Marital status: Married    Spouse name: Don   Number of children: 0   Years of education: Not on file   Highest education level: Not on file  Occupational History   Occupation: retired  Tobacco Use   Smoking status: Never   Smokeless tobacco: Never  Vaping Use   Vaping Use: Never used  Substance and Sexual Activity   Alcohol use: Yes    Alcohol/week: 2.0 standard drinks of alcohol    Types: 2 Glasses of wine per week    Comment: OCCASIONAL   Drug use: No   Sexual activity: Not on file  Other Topics Concern   Not on file  Social History Narrative   Not on file   Social Determinants of Health   Financial Resource Strain: Not on file  Food Insecurity: Not on file  Transportation Needs: Not on file  Physical Activity: Not on file  Stress: Not on file  Social Connections: Not on file     Family History: The patient's family history includes Breast cancer in her maternal aunt; Colon cancer (age of onset: 38) in her mother; Colon polyps in her mother; Stomach cancer in her maternal aunt; Uterine cancer in her maternal aunt. There is no history of Rectal cancer or Esophageal cancer.  ROS:   Please see the history of present illness.  + chronic DOE All other systems reviewed and are negative.  Labs/Other Studies Reviewed:    The following studies were reviewed today:  LHC 10/05/21    Ost Cx lesion is 20% stenosed.   Non-stenotic Ost  LAD to Prox LAD lesion was previously treated.   1.  Widely patent ostial LAD stent with no significant restenosis.  The stent extends 1 to 2 mm into the left main with partial jailing of the left circumflex.  However, there is only mild ostial left circumflex stenosis.  The coronary arteries are otherwise normal. 2.  Left ventricular angiography was not performed.  LVEDP was mildly elevated.   Recommendations: Suspect noncardiac chest pain. Continue medical therapy.  Echo 07/03/20   1. Left ventricular ejection fraction, by estimation, is 60 to 65%. The  left ventricle has normal function. The left ventricle has no regional  wall motion  abnormalities. There is moderate left ventricular hypertrophy  of the basal-septal segment. Left  ventricular diastolic parameters are consistent with Grade I diastolic  dysfunction (impaired relaxation).   2. Right ventricular systolic function is normal. The right ventricular  size is normal. There is mildly elevated pulmonary artery systolic  pressure. The estimated right ventricular systolic pressure is 88.8 mmHg.   3. The mitral valve is normal in structure. Mild mitral valve  regurgitation. No evidence of mitral stenosis.   4. The aortic valve is normal in structure. There is mild calcification  of the aortic valve. There is mild thickening of the aortic valve. Aortic  valve regurgitation is mild to moderate. Mild to moderate aortic valve  sclerosis/calcification is present,  without any evidence of aortic stenosis. Aortic valve mean gradient  measures 6.0 mmHg. Aortic valve Vmax measures 1.65 m/s.   5. The inferior vena cava is normal in size with greater than 50%  respiratory variability, suggesting right atrial pressure of 3 mmHg.   LHC 04/08/20  Ost LAD to Prox LAD lesion is 99% stenosed. A drug-eluting stent was successfully placed using a SYNERGY XD 4.0X16. Post intervention, there is a 0% residual stenosis. The left ventricular systolic  function is normal. LV end diastolic pressure is normal. The left ventricular ejection fraction is 55-65% by visual estimate. There is no mitral valve regurgitation.   1. Severe ostial/proximal LAD stenosis 2. Successful PTCA/DES x 1 ostial/proximal LAD with optimization with OCT imaging 3. No obstructive disease in the large dominant Circumflex artery.  4. Small non-dominant RCA 5. Normal LV systolic function   Recommendations: Continue DAPT with ASA and Brilinta for at least six months but preferably one year if she tolerates it well. If necessary, the Brilinta could be changed to Plavix in 3 months. Same day post PCI discharge.     Recent Labs: 10/05/2021: BUN 14; Creatinine, Ser 0.84; Hemoglobin 13.1; Platelets 230; Potassium 3.8; Sodium 141  Recent Lipid Panel    Component Value Date/Time   CHOL 159 07/28/2020 0852   TRIG 103 07/28/2020 0852   HDL 48 07/28/2020 0852   CHOLHDL 3.3 07/28/2020 0852   LDLCALC 92 07/28/2020 0852     Risk Assessment/Calculations:      Physical Exam:    VS:  BP (!) 150/68   Pulse 62   Ht 5' 3.5" (1.613 m)   Wt 215 lb 6.4 oz (97.7 kg)   SpO2 95%   BMI 37.56 kg/m     Wt Readings from Last 3 Encounters:  01/26/22 215 lb 6.4 oz (97.7 kg)  10/20/21 221 lb 9.6 oz (100.5 kg)  10/05/21 222 lb (100.7 kg)     GEN:  Well nourished, well developed in no acute distress HEENT: Normal NECK: No JVD; No carotid bruits CARDIAC: RRR, no murmurs, rubs, gallops RESPIRATORY:  Clear to auscultation without rales, wheezing or rhonchi  ABDOMEN: Soft, non-tender. Distended, + bowel sounds MUSCULOSKELETAL:  No edema; No deformity. 2+ pedal pulses, equal bilaterally SKIN: Warm and dry NEUROLOGIC:  Alert and oriented x 3 PSYCHIATRIC:  Normal affect   EKG:  EKG is ordered today and reveals normal sinus rhythm with first-degree AV block at 62 bpm, PR interval 238 ms, no ST abnormality   Diagnoses:    1. Coronary artery disease involving native coronary  artery of native heart without angina pectoris   2. Hyperlipidemia LDL goal <70   3. Chronic heart failure with preserved ejection fraction (HFpEF) ([place])   4. Essential hypertension  5. Other fatigue   6. Nonrheumatic aortic valve insufficiency     Assessment and Plan:     CAD without angina: Most recent cath 10/05/2021 with widely patent ostial LAD stent with no significant restenosis.  Stent extends 1 to 2 mm into the left main with partial jailing of the left circumflex, however there is only mild ostial left circumflex stenosis.  Coronary arteries otherwise normal, LVEDP mildly elevated.  Chronic HFpEF: Mildly elevated LVEDP on cath 10/05/2021. Feels that her dyspnea is chronic and stable. Abdominal distention has improved. No evidence of volume overload on exam. No leg edema, orthopnea, or PND. Has been sleeping in a recliner for the past 4 years. Avoids sodium in diet. Recommend continued heart healthy diet. Encouraged her to walk for exercise, start slow and gradually try to increase time and distance.   Aortic insufficiency: Mild to moderate aortic valve regurgitation on echo 06/2020.  Soft murmur on exam. We will continue to follow clinically for now.   Depression/fatigue: Symptoms of chronic pain have improved since starting Wellbutrin.  She is very excited about this improvement. Will check TSH today to ensure normal thyroid function.    Hypertension: BP is well-controlled. Has not been monitoring consistently, agrees to monitor more consistently at home.  Advised her to notify us if BP consistently > 140 or > 88.   Hyperlipidemia  LDL goal < 70: LDL 81 on 07/18/21. Met with nutritionist and already follows diet that was recommended. Will recheck lipids today. Continue rosuvastatin.      Disposition: 6 months new patient with Dr. Johney Frame, transfer from Dr. Acie Fredrickson  Medication Adjustments/Labs and Tests Ordered: Current medicines are reviewed at length with the patient today.   Concerns regarding medicines are outlined above.  Orders Placed This Encounter  Procedures   TSH   Lipid Profile   Comp Met (CMET)   EKG 12-Lead   No orders of the defined types were placed in this encounter.   Patient Instructions  Medication Instructions:   Your physician recommends that you continue on your current medications as directed. Please refer to the Current Medication list given to you today.   *If you need a refill on your cardiac medications before your next appointment, please call your pharmacy*   Lab Work:  TODAY!!!! TSH/LIPID/CMET  If you have labs (blood work) drawn today and your tests are completely normal, you will receive your results only by: [place] (if you have MyChart) OR A paper copy in the mail If you have any lab test that is abnormal or we need to change your treatment, we will call you to review the results.   Testing/Procedures:  None ordered.   Follow-Up: At [facility], you and your health needs are our priority.  As part of our continuing mission to provide you with exceptional heart care, we have created designated Provider Care Teams.  These Care Teams include your primary Cardiologist (physician) and Advanced Practice Providers (APPs -  Physician Assistants and Nurse Practitioners) who all work together to provide you with the care you need, when you need it.  We recommend signing up for the patient portal called "MyChart".  Sign up information is provided on this After Visit Summary.  MyChart is used to connect with patients for Virtual Visits (Telemedicine).  Patients are able to view lab/test results, encounter notes, upcoming appointments, etc.  Non-urgent messages can be sent to your provider as well.   To learn more about what you can do  with MyChart, go to [email or web address].    Your next appointment:   6 month(s)  The format for your next appointment:   In Person  Provider:   Nira Conn Pemberton,MD      Other Instructions  HOW TO TAKE YOUR BLOOD PRESSURE  Rest 5 minutes before taking your blood pressure. Don't  smoke or drink caffeinated beverages for at least 30 minutes before. Take your blood pressure before (not after) you eat. Sit comfortably with your back supported and both feet on the floor ( don't cross your legs). Elevate your arm to heart level on a table or a desk. Use the proper sized cuff.  It should fit smoothly and snugly around your bare upper arm.  There should be  Enough room to slip a fingertip under the cuff.  The bottom edge of the cuff should be 1 inch above the crease Of the elbow. Please monitor your blood pressure once daily 2 hours after your am medication. If you blood pressure Consistently remains above 388 (systolic) top number or over 88( diastolic) bottom number X 3 days  Consecutively.  Please call our office at [phone] or send Mychart message.     ----Avoid cold medicines with D or DM at the end of them----     Important Information About Sugar         Signed, Emmaline Life, NP  01/26/2022 1:30 PM    [place]

## 2022-01-26 ENCOUNTER — Encounter: Payer: Self-pay | Admitting: Nurse Practitioner

## 2022-01-26 ENCOUNTER — Ambulatory Visit: Payer: Medicare HMO | Attending: Nurse Practitioner | Admitting: Nurse Practitioner

## 2022-01-26 VITALS — BP 150/68 | HR 62 | Ht 63.5 in | Wt 215.4 lb

## 2022-01-26 DIAGNOSIS — I5032 Chronic diastolic (congestive) heart failure: Secondary | ICD-10-CM

## 2022-01-26 DIAGNOSIS — E785 Hyperlipidemia, unspecified: Secondary | ICD-10-CM

## 2022-01-26 DIAGNOSIS — I351 Nonrheumatic aortic (valve) insufficiency: Secondary | ICD-10-CM

## 2022-01-26 DIAGNOSIS — R5383 Other fatigue: Secondary | ICD-10-CM | POA: Diagnosis not present

## 2022-01-26 DIAGNOSIS — I10 Essential (primary) hypertension: Secondary | ICD-10-CM

## 2022-01-26 DIAGNOSIS — I2510 Atherosclerotic heart disease of native coronary artery without angina pectoris: Secondary | ICD-10-CM | POA: Diagnosis not present

## 2022-01-26 LAB — LIPID PANEL
Chol/HDL Ratio: 2.9 ratio (ref 0.0–4.4)
Cholesterol, Total: 141 mg/dL (ref 100–199)
HDL: 49 mg/dL (ref >39)
LDL Chol Calc (NIH): 69 mg/dL (ref 0–99)
Triglycerides: 131 mg/dL (ref 0–149)
VLDL Cholesterol Cal: 23 mg/dL (ref 5–40)

## 2022-01-26 LAB — TSH: TSH: 3.9 u[IU]/mL (ref 0.450–4.500)

## 2022-01-26 LAB — COMPREHENSIVE METABOLIC PANEL
ALT: 14 IU/L (ref 0–32)
AST: 17 IU/L (ref 0–40)
Albumin/Globulin Ratio: 2.4 — ABNORMAL HIGH (ref 1.2–2.2)
Albumin: 4.6 g/dL (ref 3.8–4.8)
Alkaline Phosphatase: 74 IU/L (ref 44–121)
BUN/Creatinine Ratio: 17 (ref 12–28)
BUN: 15 mg/dL (ref 8–27)
Bilirubin Total: 0.4 mg/dL (ref 0.0–1.2)
CO2: 22 mmol/L (ref 20–29)
Calcium: 10.3 mg/dL (ref 8.7–10.3)
Chloride: 107 mmol/L — ABNORMAL HIGH (ref 96–106)
Creatinine, Ser: 0.9 mg/dL (ref 0.57–1.00)
Globulin, Total: 1.9 g/dL (ref 1.5–4.5)
Glucose: 129 mg/dL — ABNORMAL HIGH (ref 70–99)
Potassium: 4 mmol/L (ref 3.5–5.2)
Sodium: 142 mmol/L (ref 134–144)
Total Protein: 6.5 g/dL (ref 6.0–8.5)
eGFR: 66 mL/min/{1.73_m2} (ref >59)

## 2022-01-26 NOTE — Patient Instructions (Signed)
Medication Instructions:   Your physician recommends that you continue on your current medications as directed. Please refer to the Current Medication list given to you today.   *If you need a refill on your cardiac medications before your next appointment, please call your pharmacy*   Lab Work:  TODAY!!!! TSH/LIPID/CMET  If you have labs (blood work) drawn today and your tests are completely normal, you will receive your results only by: [place] (if you have MyChart) OR A paper copy in the mail If you have any lab test that is abnormal or we need to change your treatment, we will call you to review the results.   Testing/Procedures:  None ordered.   Follow-Up: At [facility], you and your health needs are our priority.  As part of our continuing mission to provide you with exceptional heart care, we have created designated Provider Care Teams.  These Care Teams include your primary Cardiologist (physician) and Advanced Practice Providers (APPs -  Physician Assistants and Nurse Practitioners) who all work together to provide you with the care you need, when you need it.  We recommend signing up for the patient portal called "MyChart".  Sign up information is provided on this After Visit Summary.  MyChart is used to connect with patients for Virtual Visits (Telemedicine).  Patients are able to view lab/test results, encounter notes, upcoming appointments, etc.  Non-urgent messages can be sent to your provider as well.   To learn more about what you can do with MyChart, go to [email or web address].    Your next appointment:   6 month(s)  The format for your next appointment:   In Person  Provider:   Nira Conn Pemberton,MD     Other Instructions  HOW TO TAKE YOUR BLOOD PRESSURE  Rest 5 minutes before taking your blood pressure. Don't  smoke or drink caffeinated beverages for at least 30 minutes before. Take your blood pressure before (not after) you  eat. Sit comfortably with your back supported and both feet on the floor ( don't cross your legs). Elevate your arm to heart level on a table or a desk. Use the proper sized cuff.  It should fit smoothly and snugly around your bare upper arm.  There should be  Enough room to slip a fingertip under the cuff.  The bottom edge of the cuff should be 1 inch above the crease Of the elbow. Please monitor your blood pressure once daily 2 hours after your am medication. If you blood pressure Consistently remains above 734 (systolic) top number or over 88( diastolic) bottom number X 3 days  Consecutively.  Please call our office at [phone] or send Mychart message.     ----Avoid cold medicines with D or DM at the end of them----     Important Information About Sugar

## 2022-02-09 ENCOUNTER — Other Ambulatory Visit: Payer: Self-pay | Admitting: Gastroenterology

## 2022-02-13 DIAGNOSIS — R7303 Prediabetes: Secondary | ICD-10-CM | POA: Diagnosis not present

## 2022-02-13 DIAGNOSIS — I10 Essential (primary) hypertension: Secondary | ICD-10-CM | POA: Diagnosis not present

## 2022-02-13 DIAGNOSIS — Z0000 Encounter for general adult medical examination without abnormal findings: Secondary | ICD-10-CM | POA: Diagnosis not present

## 2022-02-13 DIAGNOSIS — M8588 Other specified disorders of bone density and structure, other site: Secondary | ICD-10-CM | POA: Diagnosis not present

## 2022-02-13 DIAGNOSIS — E559 Vitamin D deficiency, unspecified: Secondary | ICD-10-CM | POA: Diagnosis not present

## 2022-02-13 DIAGNOSIS — I5032 Chronic diastolic (congestive) heart failure: Secondary | ICD-10-CM | POA: Diagnosis not present

## 2022-02-13 DIAGNOSIS — I2510 Atherosclerotic heart disease of native coronary artery without angina pectoris: Secondary | ICD-10-CM | POA: Diagnosis not present

## 2022-02-13 DIAGNOSIS — F3341 Major depressive disorder, recurrent, in partial remission: Secondary | ICD-10-CM | POA: Diagnosis not present

## 2022-02-13 DIAGNOSIS — I700 Atherosclerosis of aorta: Secondary | ICD-10-CM | POA: Diagnosis not present

## 2022-02-13 DIAGNOSIS — E538 Deficiency of other specified B group vitamins: Secondary | ICD-10-CM | POA: Diagnosis not present

## 2022-02-13 DIAGNOSIS — E785 Hyperlipidemia, unspecified: Secondary | ICD-10-CM | POA: Diagnosis not present

## 2022-03-03 ENCOUNTER — Other Ambulatory Visit: Payer: Self-pay | Admitting: Internal Medicine

## 2022-03-03 DIAGNOSIS — J849 Interstitial pulmonary disease, unspecified: Secondary | ICD-10-CM

## 2022-03-06 ENCOUNTER — Ambulatory Visit (INDEPENDENT_AMBULATORY_CARE_PROVIDER_SITE_OTHER): Payer: Medicare HMO | Admitting: Internal Medicine

## 2022-03-06 ENCOUNTER — Encounter: Payer: Self-pay | Admitting: Internal Medicine

## 2022-03-06 ENCOUNTER — Ambulatory Visit: Payer: Medicare HMO | Admitting: Internal Medicine

## 2022-03-06 VITALS — BP 130/68 | HR 57 | Temp 98.6°F | Ht 63.0 in | Wt 215.2 lb

## 2022-03-06 DIAGNOSIS — J849 Interstitial pulmonary disease, unspecified: Secondary | ICD-10-CM

## 2022-03-06 DIAGNOSIS — J8489 Other specified interstitial pulmonary diseases: Secondary | ICD-10-CM | POA: Diagnosis not present

## 2022-03-06 LAB — PULMONARY FUNCTION TEST
DL/VA % pred: 100 %
DL/VA: 4.13 ml/min/mmHg/L
DLCO cor % pred: 91 %
DLCO cor: 16.74 ml/min/mmHg
DLCO unc % pred: 91 %
DLCO unc: 16.74 ml/min/mmHg
FEF 25-75 Pre: 2.54 L/sec
FEF2575-%Pred-Pre: 169 %
FEV1-%Pred-Pre: 113 %
FEV1-Pre: 2.21 L
FEV1FVC-%Pred-Pre: 112 %
FEV6-%Pred-Pre: 106 %
FEV6-Pre: 2.63 L
FEV6FVC-%Pred-Pre: 105 %
FVC-%Pred-Pre: 100 %
FVC-Pre: 2.63 L
Pre FEV1/FVC ratio: 84 %
Pre FEV6/FVC Ratio: 100 %

## 2022-03-06 NOTE — Progress Notes (Signed)
IOV 07/03/2017  Chief Complaint  Patient presents with   Pulm Consult    Referred by Dr. Harlan Stains for possible ILD. Per patient, she does have some occasional SOB and chest tightness.    Alison Ayala 77 y.o. female [place]  [place] [id number] -referred for evaluation of potential interstitial lung disease.  According to the patient on routine exam it was felt that the sternal head of the clavicles was a little bit prominent so therefore a chest x-ray was done by the primary care physician which was not revealing.  Subsequently patient underwent CT scan of the chest documented below that I personally visualized and agree with the findings.  The 2 significant findings are anterior mediastinal mass with partial calcification and also possible interstitial lung disease and therefore she has been referred here.  She is denying any acute complaints . SHe has appt with Dr Nils Pyle for anterior mediastinal mass  We performed the pulmonary integrated interstitial lung disease questionnaire and for the symptoms she denied any cough but she did admit to shortness of breath that started insidiously approximately 2 years ago and for the last 6 months is gradually gotten worse.  It is not episodic.  It is present on exertion and relieved by rest.  Today when we walked her she did drop her pulse ox greater than 3 points but did not go below 88% her heart rate did respond but it did not go over 90/min.  On a level 5 scale she rates level 3 dyspnea for picking up and straightening, sweeping and vacuuming walking up a hill and walking upstairs.  She rates a level 2 dyspnea for walking on a level ground at her own pace and walking with appears, making the bed, shopping and doing laundry.  She gives a level 1 dyspnea for standing up and dressing but otherwise all other activities a level 0.  Review of systems: She has chronic fatigue and arthralgia.  She also has dysphagia for the  last few to several years [4-5 years].  She has had dry eyes for the last few decades and dry mouth for the last 1 year.  She admits to acid reflux disease for the last 40 years and on and off cold sores in her mouth.  There is no recurrent fever or weight loss or Raynaud  Past medical history: Positive for acid reflux for several decades but negative for asthma, COPD, heart failure, rheumatoid arthritis, other collagen vascular disease, sleep apnea, immune system disorder, pulmonary hypertension, thyroid disease, stroke, seizures, hepatitis, tuberculosis, kidney disease, pneumonia or blood clots, heart disease pleurisy  Family history of lung disease: Denies any pulmonary fibrosis or any other lung disease  Exposure history: Denies any smoking, drugs of abuse.  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  In particular she denies any occupation that might predispose her to hypersensitivity pneumonitis or inorganic dust related interstitial lung disease.  Pulmonary toxicity history: Extensive drug list causing pulmonary toxicity reviewed.  And negative for all of that     CT chest 06/19/17 = personally visualized the CT chest and agree with the findings of large anterior mediastinal mass and possible ILD  - IMPRESSION: 1. No focal soft tissue mass or other abnormality to account for the  perceived palpable abnormality in the suprasternal region. 2. However, there is a large anterior mediastinal mass which demonstrates heterogeneous enhancement and calcification, with several smaller adjacent lesions. These findings are concerning for potential neoplasm, with primary differential considerations including germ cell neoplasm or lesion of thymic origin. On PET scan 07/16/17 - 4.6 cm anterior mediastinal mass with dense central calcifications  is hypermetabolic. SUV max is 6.55. Findings highly suspicious for a thymic neoplasmThoracic surgical consultation is strongly recommended. 3. 1.7 x 2.0 cm left adrenal nodule is indeterminate. Further characterization with nonemergent adrenal protocol CT scan is recommended in the near future to exclude the possibility of a metastatic lesion. 4. Unusual appearance of the lung parenchyma which is concerning for potential interstitial lung disease. Outpatient referral to pulmonology for further evaluation is suggested, as well as a follow-up high-resolution chest CT in 6-12 months to assess for temporal changes in the appearance of the lung parenchyma. 5. Multiple healing left-sided rib fractures, as above. These are all nondisplaced or minimally displaced. No associated pneumothorax noted at this time.     Electronically Signed   By: Vinnie Langton M.D.   On: 06/20/2017 09:20   Results for Alison Ayala (MRN [id number]) as of 07/03/2017 13:34  Ref. Range 07/26/2011 08:59  Hemoglobin Latest Ref Range: 12.0 - 15.0 g/dL 20.7 (H)    OV 08/23/2017  Chief Complaint  Patient presents with   Follow-up    Pt was hospitalized 7/2-7/7 due to having mediastinal mass resection and a lung biopsy was also performed. HRCT performed 6/3, PFT performed 6/5, and PET scan performed 6/10.  Pt states overall she has been doing good but is anxious to know about results from biopsy.    Follow-up mediastinal massAnteriorand PET hot Follow-up interstitial lung disease  She has now been discharged from post biopsy. She is doing well. She feels baseline. Shortness of breath with exertion is the same and it is mild. Walking desaturation test is documented below is unchanged. A biopsy date was 08/07/2017. The anterior mediastinal mass shows Castleman's disease. Based on PET scan I suspect this is unicentric. She has follow-up with thoracic service is pending. She also had open lung biopsy for early  interstitial lung disease. The formal report is still pending but I discussed with pathologist Dr. Vic Ripper who feels that this is very early and mild interstitial lung disease without any specific pattern but suspects this is early NSIP. There is no granuloma.     OV 11/26/2017  Subjective:  Patient ID: Alison Ayala, female , DOB: Mar 12, 1945 , age 77 y.o. , MRN: [id number] , ADDRESS: [address]. [place] [address]   11/26/2017 -   Chief Complaint  Patient presents with   Follow-up    Doing well feeling the same as last visit.    Follow-up mediastinal massAnteriorand PET hot = Castleman's disease, you are eccentric based on PET scan, July 2019 Follow-up interstitial lung disease -likely NSIP, July 2019   HPI Alison Ayala 77 y.o. -at this point in time she returns for follow-up for interstitial lung disease.  Since her last visit in July 2019 we had a multidisciplinary case conference in August 2019.  Despite the air trapping seen on CT scan of the chest her lung biopsy has been read out as an SIP.  There was a tendency for some multinucleated giant cells but no clear-cut granuloma.  These are few and far between.  I did a history retake for hypersensitivity pneumonitis.  She  vehemently denies any mold or mildew exposure.  She does not use any wind instruments.  She does not do any farming or guarding.  No exposure to rotten wood on mulch.  No pet hamsters or gerbils or birds.  No using feather pillows.  No misty fountain in the house.  No nebulizer use no CPAP use.  No humidifier use.  The house is only 77 years old.  Even in her prior jobs there is no mold exposure.  She is currently exercising.  Her walking desaturation test seems to have improved.    OV 02/26/2018  Subjective:  Patient ID: Alison Ayala, female , DOB: 1946/01/20 , age 76 y.o. , MRN: [id number] , ADDRESS: [place]. [place] [address]  02/26/2018 -   Chief Complaint  Patient  presents with   Follow-up    Pt states she has been doing well since last visit. States she still becomes SOB with exertion.     HPI Alison Ayala 77 y.o. -seen October 2019 for the above issues.  She is not taking any prednisone.  She does not want to do it because of side effects.  In the interim she is continued to do well.  She has mild class II dyspnea on exertion relieved by rest.  This is stable.  No new interim issues you are up-to-date with a flu shot.  She had pulmonary function test that shows improvement in FVC with slight decline in DLCO/stable DLCO.  But overall she feels good.  Walking desaturation test shows no change.       07/27/2020- Interim hx  Patient was contacted today to review PFT results. Her breath test showed normal spirometry and DLCO. No evidence of worsening pulmonary fibrosis. She has been checking her oxygen at home and her readings are consistently >90%. Very rarely she will see her O2 drop to 89%. She has an apt with cardiology in approximately 1 week.    Observations/Objective:  - Able to speak in full sentences; no overt shortness of breath, wheezing or cough  Pulmonary function testing: 07/22/20 - FVC 2.59 (92%), FVC 2.26 (107%), ratio 87, DLCOcor 15.94 (83%)  02/26/2018-FVC 2.55 (88% predicted), ratio 85, FEV1 2.16 (99% predicted), DLCO 18.35 (75% predicted)  Assessment and Plan:  ILD: - Early NSIP, July 2019 - on surgical lung biopsy - No findings to indicate worsening ILD; CT imaging and PFTs remain stable - Due for repeat HRCT in May 2023  Acute respiratory failure: - Seems to have resolved, she has been monitoring her O2 saturation at home and reports O2 consistently remains >90% RA   Follow Up Instructions:  - FU with Dr. Chase Caller in Dec 2022  OV 08/25/2021  Subjective:  Patient ID: Alison Ayala, female , DOB: [birth year] , age 51 y.o. , MRN: [id number] , ADDRESS: [place] [id number] PCP Harlan Stains, MD Patient Care Team: Harlan Stains, MD as PCP - General (Family Medicine) Nahser, Wonda Cheng, MD as PCP - Cardiology (Cardiology)  This Provider for this visit: Treatment Team:  Attending Provider: Brand Males, MD  Follow-up mediastinal mass Anteriorand PET hot = Castleman's disease,  based on PET scan, July 2019 - biopsy provine Follow-up interstitial lung disease -likely early/mind NSIP, July 2019 - on surgical lung biopsy  08/25/2021 -   Chief Complaint  Patient presents with   Follow-up    Follow up. Patient has no complaints.      HPI Alison Ayala [id number] 77  y.o. -personally not seen in 3 years but in the interim has followed with nurse practitioner during the [place] pandemic.  She saw a nurse practitioner last year.  She continues to be stable with very minimal symptom burden.  She says when she climbs 15 steps to her attic she will get a little bit winded and she will breathe a little bit harder but nothing out of the ordinary.  1 year ago she did get cardiac stents.  There is currently no chest pain.  Rates dyspnea is mild no cough no wheezing.  She had high-resolution CT chest in May 2023.  The level of NSIP pulmonary fibrosis very small mild and stable.  She has been reassured about this.  There are no new issues.   OV 03/06/2022  Subjective:  Patient ID: Alison Ayala, female , DOB: 11-Nov-1945 , age 26 y.o. , MRN: [id number] , ADDRESS: [place] [phone] PCP Harlan Stains, MD Patient Care Team: Harlan Stains, MD as PCP - General (Family Medicine) Nahser, Wonda Cheng, MD as PCP - Cardiology (Cardiology)  This Provider for this visit: Treatment Team:  Attending Provider: Brand Males, MD   Follow-up mediastinal mass Anteriorand PET hot = Castleman's disease,   - based on PET scan, July 2019 - biopsy provine  Follow-up interstitial lung disease  -likely early/mind NSIP, July 2019 - on surgical lung biopsy   03/06/2022 -    Chief Complaint  Patient presents with   Follow-up    Pt is here for follow up for ILD. Pt had spiro and dlco this visit. Heart cath done 10/05/21. Pt states that her breathing is doing well with no issues, noted.      HPI Alison Ayala 77 y.o. -returns for routine follow-up for her stable NSIP that is mild.  Last seen in July 2023.  Since then she says she is overall stable.  She says sometimes recently she was started on an antidepressant and since then the stomach pain is improved.  Overall no urgent care visits or any surgeries.  Review of the records indicate that in August 2023 she ended up in the emergency department and had chest pain that was atypical.  She had a cardiac cath that showed circumflex lesion 20%.  Not obstructive coronary artery disease.  It was felt she had noncardiac chest pain.  She states this is improved with antidepressant.  Her current symptoms are minimal.  Her pulmonary function test today that I personally reviewed is stable.  Walking desaturation test also stable.  She prefers continued observation therapy.  SYMPTOM SCALE - ILD 03/06/2022  Current weight   O2 use ra  Shortness of Breath 0 -> 5 scale with 5 being worst (score 6 If unable to do)  At rest 0  Simple tasks - showers, clothes change, eating, shaving 0  Household (dishes, doing bed, laundry) 1  Shopping 1  Walking level at own pace 1  Walking up Stairs 1  Total (30-36) Dyspnea Score 4  How bad is your cough? 1  How bad is your fatigue 1  How bad is nausea 2  How bad is vomiting?  0  How bad is diarrhea? 0  How bad is anxiety? 0  How bad is depression 1  Any chronic pain - if so where and how bad x       Simple office walk 185 feet x  3 laps goal with forehead probe 07/03/2017  08/23/2017  11/26/2017  02/26/2018  08/25/2021  03/06/2022   O2 used Room air Room air Room air rooom air ra ra  Number laps completed '3 3 3 3 3 3  '$ Comments about pace  normal  Good pace    Resting Pulse  Ox/HR 96% and 64/min 97% and HR 87/min  96% and 73/min 100% and 66/min 96% and HR 103 97%   Final Pulse Ox/HR 92% and 88/min 92% and HR 100/min  96% and 91/min 95% and 95/min 95% and HR 126 94%  Desaturated </= 88% no0 no  no no non   Desaturated <= 3% points yes yes  no no no   Got Tachycardic >/= 90/min no yes  yes yes no   Symptoms at end of test Dyspnea reproduced Mild fatigue only  No complaints Mild dyspnea   Miscellaneous comments Normal pace slow stable  improved        CT Chest data - HRCT 06/15/21  Narrative & Impression  CLINICAL DATA:  Interstitial lung disease   EXAM: CT CHEST WITHOUT CONTRAST   TECHNIQUE: Multidetector CT imaging of the chest was performed following the standard protocol without intravenous contrast. High resolution imaging of the lungs, as well as inspiratory and expiratory imaging, was performed.   RADIATION DOSE REDUCTION: This exam was performed according to the departmental dose-optimization program which includes automated exposure control, adjustment of the mA and/or kV according to patient size and/or use of iterative reconstruction technique.   COMPARISON:  CT chest dated Jul 04, 2020; chest CT dated September 02, 2018   FINDINGS: Cardiovascular: Normal heart size. No pericardial effusion. Normal caliber thoracic aorta with atherosclerotic disease. Coronary artery calcifications of the LAD and LAD stent. Status post CABG.   Mediastinum/Nodes: Moderate hiatal hernia. Thyroid is unremarkable. No pathologically enlarged lymph nodes seen in the chest.   Lungs/Pleura: Central airways are patent. Mild basilar air trapping. Mild bilateral bronchiectasis, most pronounced in the lower lungs. Prior right lower lobe wedge resection. Left basilar atelectasis. No consolidation, pleural effusion or pneumothorax.   Upper Abdomen: Stable low-attenuation nodule of the left adrenal gland, likely adenoma. No acute abnormality.   Musculoskeletal: Prior  median sternotomy with intact sternal wires. No aggressive appearing osseous lesions. VP shunt coursing through the soft tissues of the right hemithorax.   IMPRESSION: 1. Patchy areas of ground-glass attenuation which seen on Jul 04, 2020 chest CT have resolved, and may have been due to pulmonary edema or acute exacerbation of ILD. On today's exam there is very minimal subpleural reticulation with no evidence of progression when compared with prior September 02, 2018 exam. Findings remain in keeping with very mild NSIP pattern fibrosis as demonstrated by wedge biopsy histology. 2. Aortic Atherosclerosis (ICD10-I70.0).     Electronically Signed   By: Yetta Glassman M.D.   On: 06/16/2021 20:29      No results found.    PFT     Latest Ref Rng & Units 03/06/2022    9:53 AM 07/22/2020    2:36 PM 10/08/2018    1:52 PM 02/26/2018    1:48 PM 07/11/2017    9:49 AM  PFT Results  FVC-Pre L 2.63  P 2.59  2.74  2.55  2.14   FVC-Predicted Pre % 100  P 92  93  88  73   FVC-Post L  2.59      FVC-Predicted Post %  92      Pre FEV1/FVC % % 84  P 86  87  85  85  Post FEV1/FCV % %  87      FEV1-Pre L 2.21  P 2.23  2.39  2.16  1.83   FEV1-Predicted Pre % 113  P 105  107  99  83   FEV1-Post L  2.26      DLCO uncorrected ml/min/mmHg 16.74  P 14.56  17.78  18.35  19.55   DLCO UNC% % 91  P 76  90  75  80   DLCO corrected ml/min/mmHg 16.74  P 15.94      DLCO COR %Predicted % 91  P 83      DLVA Predicted % 100  P 90  111  97  108   TLC L  4.55      TLC % Predicted %  90      RV % Predicted %  71        P Preliminary result       has a past medical history of CAD (coronary artery disease), Carpal tunnel syndrome of right wrist, Castleman disease ([place]), Diverticulosis, GERD (gastroesophageal reflux disease), H/O hiatal hernia, H/O intraventricular hemorrhage (2004--  SPONTANEOUS), Heart murmur, Hiatal hernia, Hip pain, right (OCCASIONAL -- PINCHED NERVE), History of gastric ulcer (2001),  Hyperlipidemia, Hypertension, Hypoglycemia, Internal hemorrhoids, Interstitial lung disease ([place]), [place] esophagus, Mediastinal mass, Mitral valve prolapse, Normal echocardiogram (30 YRS AGO), NSIP (nonspecific interstitial pneumonia) ([place]), S/P ventriculoperitoneal shunt (2004--- PER PT SHUNT IS WORKING WELL --  NO ISSUES), SAH (subarachnoid hemorrhage) (HCC), Squamous cell cancer of scalp and skin of neck, and Tubular adenoma of colon.   reports that she has never smoked. She has never used smokeless tobacco.  Past Surgical History:  Procedure Laterality Date   BILATERAL FRONTAL INTERVENTRICULAR CATHETER VIA TWIST DRILL HOLE  AUG 2004   SPONTANEOUS SUBARACHNOID  HEMORRHAGE AND  INTRAVENTRICULAR HEMORRHAGE   BIOPSY  06/16/2021   Procedure: BIOPSY;  Surgeon: Jerene Bears, MD;  Location: [facility];  Service: Gastroenterology;;   CARPAL TUNNEL RELEASE  07/26/2011   Procedure: CARPAL TUNNEL RELEASE;  Surgeon: Magnus Sinning, MD;  Location: [facility];  Service: Orthopedics;  Laterality: Right;   CATARACT EXTRACTION W/ INTRAOCULAR LENS  IMPLANT, BILATERAL  2012   BILATERAL   COLONOSCOPY WITH PROPOFOL N/A 06/16/2021   Procedure: COLONOSCOPY WITH PROPOFOL;  Surgeon: Jerene Bears, MD;  Location: [facility];  Service: Gastroenterology;  Laterality: N/A;   CORONARY STENT INTERVENTION N/A 04/08/2020   Procedure: CORONARY STENT INTERVENTION;  Surgeon: Burnell Blanks, MD;  Location: [facility];  Service: Cardiovascular;  Laterality: N/A;   DUPUYTREN CONTRACTURE RELEASE  07/26/2011   Procedure: DUPUYTREN CONTRACTURE RELEASE;  Surgeon: Magnus Sinning, MD;  Location: [facility];  Service: Orthopedics;  Laterality: Right;  EXCISION OF DUPYTRENS BAND THUMB- INDEX WEB SPACE   ESOPHAGEAL MANOMETRY N/A 01/27/2019   Procedure: ESOPHAGEAL MANOMETRY (EM);  Surgeon: Jerene Bears, MD;  Location: [facility];  Service: Gastroenterology;  Laterality: N/A;    ESOPHAGOGASTRODUODENOSCOPY N/A 06/16/2021   Procedure: ESOPHAGOGASTRODUODENOSCOPY (EGD);  Surgeon: Jerene Bears, MD;  Location: [facility];  Service: Gastroenterology;  Laterality: N/A;   EYE SURGERY Bilateral    scrapped corneas   INTRAVASCULAR IMAGING/OCT N/A 04/08/2020   Procedure: INTRAVASCULAR IMAGING/OCT;  Surgeon: Burnell Blanks, MD;  Location: [facility];  Service: Cardiovascular;  Laterality: N/A;   LEFT HEART CATH AND CORONARY ANGIOGRAPHY N/A 04/08/2020   Procedure: LEFT HEART CATH AND CORONARY ANGIOGRAPHY;  Surgeon: Burnell Blanks,  MD;  Location: [facility];  Service: Cardiovascular;  Laterality: N/A;   LEFT HEART CATH AND CORONARY ANGIOGRAPHY N/A 10/05/2021   Procedure: LEFT HEART CATH AND CORONARY ANGIOGRAPHY;  Surgeon: Wellington Hampshire, MD;  Location: [facility];  Service: Cardiovascular;  Laterality: N/A;   LUNG BIOPSY Right 08/07/2017   Procedure: Open LUNG BIOPSY;  Surgeon: Ivin Poot, MD;  Location: [facility];  Service: Thoracic;  Laterality: Right;   RESECTION OF MEDIASTINAL MASS N/A 08/07/2017   Procedure: RESECTION OF MEDIASTINAL MASS;  Surgeon: Ivin Poot, MD;  Location: [facility];  Service: Thoracic;  Laterality: N/A;   RETINAL DETACHMENT REPAIR W/ SCLERAL BUCKLE LE  03-10-2011   LEFT EYE   STERNOTOMY N/A 08/07/2017   Procedure: STERNOTOMY;  Surgeon: Prescott Gum, Collier Salina, MD;  Location: [facility];  Service: Thoracic;  Laterality: N/A;   VENTRICULOPERITONEAL SHUNT  10-08-2002   RIGHT FRONTAL FOR COMMUNICATING HYDROCEPHALUS    Allergies  Allergen Reactions   Brilinta [Ticagrelor] Shortness Of Breath   Coreg [Carvedilol] Palpitations and Other (See Comments)    Abdominal bloating   Claritin [Loratadine] Other (See Comments)    Blurred vision Dry eyes   Lipitor [Atorvastatin] Other (See Comments)    Fatigue    Lisinopril Other (See Comments)    "drunk feeling"    Immunization History  Administered Date(s) Administered   Fluad  Quad(high Dose 65+) 10/08/2018   Influenza Split 10/16/2018   Influenza, High Dose Seasonal PF 10/07/2016, 11/06/2017, 10/15/2020   Influenza-Unspecified 01/13/2011, 12/11/2011, 10/03/2012, 10/07/2013, 10/13/2014, 10/14/2015, 10/24/2016, 11/06/2017, 01/05/2020   PFIZER(Purple Top)SARS-COV-2 Vaccination 02/26/2019, 03/17/2019, 12/19/2019   Pneumococcal Conjugate-13 04/04/2013   Pneumococcal Polysaccharide-23 07/18/2010   Td 10/14/2015   Tdap 05/31/2005   Zoster Recombinat (Shingrix) 11/06/2017, 12/25/2018   Zoster, Live 01/21/2008, 11/06/2017, 12/25/2018    Family History  Problem Relation Age of Onset   Colon cancer Mother 74   Colon polyps Mother    Stomach cancer Maternal Aunt    Breast cancer Maternal Aunt    Uterine cancer Maternal Aunt    Rectal cancer Neg Hx    Esophageal cancer Neg Hx      Current Outpatient Medications:    acetaminophen (TYLENOL) 500 MG tablet, Take 1-2 tablets (500-1,000 mg total) by mouth 2 (two) times daily as needed for moderate pain or headache., Disp: 30 tablet, Rfl: 0   buPROPion (WELLBUTRIN XL) 300 MG 24 hr tablet, Take 300 mg by mouth every morning., Disp: , Rfl:    carboxymethylcellulose (REFRESH PLUS) 0.5 % SOLN, Place 1 drop into both eyes in the morning., Disp: , Rfl:    clopidogrel (PLAVIX) 75 MG tablet, TAKE 1 TABLET BY MOUTH EVERY DAY, Disp: 90 tablet, Rfl: 3   furosemide (LASIX) 40 MG tablet, Take 1 tablet by mouth every Monday, Wednesday, and Friday, Disp: 36 tablet, Rfl: 3   metoprolol succinate (TOPROL-XL) 100 MG 24 hr tablet, Take 1 tablet (100 mg total) by mouth daily. Take with or immediately following a meal., Disp: 90 tablet, Rfl: 3   nitroGLYCERIN (NITROSTAT) 0.4 MG SL tablet, DISSOLVE 1 TABLET UNDER THE TONGUE ONCE FOR EPIGASTRIC PAIN/CHEST PAIN NOT RELIEVED BY PEPPERMINT ALTOIDS. IF THIS DOES NOT RESOLVE PAIN, CALL DR!, Disp: 25 tablet, Rfl: 8   ondansetron (ZOFRAN-ODT) 4 MG disintegrating tablet, TAKE 1 TABLET BY MOUTH EVERY 8  HOURS AS NEEDED FOR NAUSEA AND VOMITING, Disp: 30 tablet, Rfl: 0   potassium chloride SA (KLOR-CON M20) 20 MEQ tablet, Take 1 tablet  by mouth on same days as Furosemide (Monday, Wednesday, and Friday), Disp: 36 tablet, Rfl: 3   rosuvastatin (CRESTOR) 20 MG tablet, TAKE 1 TABLET BY MOUTH EVERY DAY, Disp: 90 tablet, Rfl: 3   valsartan (DIOVAN) 320 MG tablet, Take 1 tablet (320 mg total) by mouth daily., Disp: 90 tablet, Rfl: 3      Objective:   Vitals:   03/06/22 1045  BP: 130/68  Pulse: (!) 57  Temp: 98.6 F (37 C)  TempSrc: Oral  SpO2: 96%  Weight: 215 lb 3.2 oz (97.6 kg)  Height: '5\' 3"'$  (1.6 m)    Estimated body mass index is 38.12 kg/m as calculated from the following:   Height as of this encounter: '5\' 3"'$  (1.6 m).   Weight as of this encounter: 215 lb 3.2 oz (97.6 kg).  '@WEIGHTCHANGE'$ @  [employer]   03/06/22 1045  Weight: 215 lb 3.2 oz (97.6 kg)     Physical Exam    General: No distress. Looks well. Obese Neuro: Alert and Oriented x 3. GCS 15. Speech normal Psych: Pleasant Resp:  Barrel Chest - no.  Wheeze - no, Crackles - no, No overt respiratory distress CVS: Normal heart sounds. Murmurs - no Ext: Stigmata of Connective Tissue Disease - no HEENT: Normal upper airway. PEERL +. No post nasal drip        Assessment:       ICD-10-CM   1. NSIP (nonspecific interstitial pneumonia) ([place])  J84.89     2. ILD (interstitial lung disease) ([place])  J84.9          Plan:     Patient Instructions     ICD-10-CM   1. NSIP (nonspecific interstitial pneumonia) ([place]) J84.89   2. ILD (interstitial lung disease) ([place]) J84.9   3. 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 COntinued stability expected though there is some low risk for progression. Lst CT May 2023 with stability since 2020  Plan - continue to monitor off prednisone  - 6- 9 months do spirometry and dlco - if lungs ever get worse, a drug called Ofev +/- clinical trials is an  option - lose weight - talk to PCP Harlan Stains, MD if medicines are an options   Followup 6-9 months but after  PFT; symptoms score and walk test at followup   Moderate Complexity MDM NEW OFFICE  The table below is from the 2021 E/M guidelines, first released in 2021, with minor revisions added in 2023. Must meet the requirements for 2 out of 3 dimensions to qualify.    Number and complexity of problems addressed Amount and/or complexity of data reviewed Risk of complications and/or morbidity  One or more chronic illness with mild exacerbation, progression, or side effects of treatment  Two or more stable chronic illnesses - castleman and  NSIP  One undiagnosed new problem with uncertain prognosis  One acute illness with systemic symptoms   Acute complicated injury Must meet the requirements for 1 of 3 of the categories)  Category 1: Tests and documents, historian  Any combination of 3 of the following:  Assessment requiring an independent historian  Review of prior external records  Review of results of each unique test  Ordering of each unique test    Category 2: Interpretation of tests  Independent interpretation of a test perfromed by another physician/NPP  Category 3: Discuss management/tests  Discussion of magagement or tests with an external physician/NPP Prescription drug management  Decision regarding minor surgery  with identfied patient or procedure risk factors  Decision regarding elective major surgery without identified patient or procedure risk factors  Diagnosis or treatment significantly limited by social determinants of health               SIGNATURE    Dr. Brand Males, M.D., F.C.C.P,  Pulmonary and Critical Care Medicine Staff Physician, [place] Director - Interstitial Lung Disease  Program  [facility] [place] [facility] [place], [place], [address]  Pager: [phone], If no answer or between  15:00h - 7:00h: call [phone] Telephone: [phone]  11:05 AM 03/06/2022

## 2022-03-06 NOTE — Progress Notes (Signed)
Spiro and DLCO performed today. 

## 2022-03-06 NOTE — Patient Instructions (Addendum)
ICD-10-CM   1. NSIP (nonspecific interstitial pneumonia) ([place]) J84.89   2. ILD (interstitial lung disease) ([place]) J84.9   3. 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 COntinued stability expected though there is some low risk for progression. Lst CT May 2023 with stability since 2020  Plan - continue to monitor off prednisone  - 6- 9 months do spirometry and dlco - if lungs ever get worse, a drug called Ofev +/- clinical trials is an option - lose weight - talk to PCP Harlan Stains, MD if medicines are an options   Followup 6-9 months but after  PFT; symptoms score and walk test at followup

## 2022-03-06 NOTE — Patient Instructions (Signed)
Spiro and DLCO performed today. 

## 2022-03-07 DIAGNOSIS — Z1231 Encounter for screening mammogram for malignant neoplasm of breast: Secondary | ICD-10-CM | POA: Diagnosis not present

## 2022-03-15 DIAGNOSIS — M8589 Other specified disorders of bone density and structure, multiple sites: Secondary | ICD-10-CM | POA: Diagnosis not present

## 2022-03-21 DIAGNOSIS — H35372 Puckering of macula, left eye: Secondary | ICD-10-CM | POA: Diagnosis not present

## 2022-03-21 DIAGNOSIS — H31092 Other chorioretinal scars, left eye: Secondary | ICD-10-CM | POA: Diagnosis not present

## 2022-03-21 DIAGNOSIS — H16223 Keratoconjunctivitis sicca, not specified as Sjogren's, bilateral: Secondary | ICD-10-CM | POA: Diagnosis not present

## 2022-03-21 DIAGNOSIS — H18593 Other hereditary corneal dystrophies, bilateral: Secondary | ICD-10-CM | POA: Diagnosis not present

## 2022-03-23 ENCOUNTER — Encounter: Payer: Self-pay | Admitting: Internal Medicine

## 2022-03-23 ENCOUNTER — Ambulatory Visit: Payer: Medicare HMO | Admitting: Internal Medicine

## 2022-03-23 VITALS — BP 126/62 | HR 63 | Ht 63.5 in | Wt 216.0 lb

## 2022-03-23 DIAGNOSIS — K224 Dyskinesia of esophagus: Secondary | ICD-10-CM

## 2022-03-23 DIAGNOSIS — R140 Abdominal distension (gaseous): Secondary | ICD-10-CM | POA: Diagnosis not present

## 2022-03-23 DIAGNOSIS — K219 Gastro-esophageal reflux disease without esophagitis: Secondary | ICD-10-CM

## 2022-03-23 MED ORDER — PANTOPRAZOLE SODIUM 40 MG PO TBEC
40.0000 mg | DELAYED_RELEASE_TABLET | Freq: Every day | ORAL | 3 refills | Status: DC
Start: 2022-03-23 — End: 2022-04-11

## 2022-03-23 NOTE — Progress Notes (Signed)
Subjective:    Patient ID: Alison Ayala, female    DOB: 14-Aug-1945, 77 y.o.   MRN: [id number]  HPI Ronda Lubarsky is a 77 year old female with a history of GERD, jackhammer esophagus, SIBO, gastritis, previous constipation who is here for follow-up. She was last seen in March and May 2023. She also has a history of CAD with placement of DES in March 2022 on Plavix, hypertension and hyperlipidemia. She is here alone today.   EGD 06/16/2021:  Widely patent Schatzki's ring, 3 cm hiatal hernia, normal stomach and duodenum.  Biopsies from the stomach mild chronic gastritis without H. pylori.  Normal small bowel biopsies Colonoscopy 06/16/2021: Multiple diverticula in the sigmoid and descending colon.  Exam otherwise normal.  She reports that she has been having more chest discomfort which lasted about a week in June of last year and she ended up going to the ER.  She was told that her heart was okay.  Then later in September primary care started her on her depression med with bupropion.  About 3 weeks after she started this medicine her chest discomfort is gone.  What she is feeling more now is abdominal bloating but also tightness in her chest with activity and with bending over.  No dysphagia or odynophagia.  Bowel movements have been mostly regular without blood or melena.  She did try amitriptyline but felt no improvement and stopped this medication.  She has followed with cardiology has had catheterization and will be changing to Dr. Johney Frame in June of this year.  Review of Systems As per HPI, otherwise negative  Current Medications, Allergies, Past Medical History, Past Surgical History, Family History and Social History were reviewed in [employer] record.    Objective:   Physical Exam BP 126/62   Pulse 63   Ht 5' 3.5" (1.613 m)   Wt 216 lb (98 kg)   SpO2 96%   BMI 37.66 kg/m  Gen: awake, alert, NAD HEENT: anicteric  Neuro: nonfocal     Latest  Ref Rng & Units 10/05/2021    7:35 AM 07/05/2020    2:39 AM 07/04/2020    6:18 AM  CBC  WBC 4.0 - 10.5 K/uL 4.6  6.3  8.6   Hemoglobin 12.0 - 15.0 g/dL 13.1  10.9  12.3   Hematocrit 36.0 - 46.0 % 39.3  32.3  38.0   Platelets 150 - 400 K/uL 230  325  379    CMP     Component Value Date/Time   NA 142 01/26/2022 1138   K 4.0 01/26/2022 1138   CL 107 (H) 01/26/2022 1138   CO2 22 01/26/2022 1138   GLUCOSE 129 (H) 01/26/2022 1138   GLUCOSE 134 (H) 10/05/2021 0735   BUN 15 01/26/2022 1138   CREATININE 0.90 01/26/2022 1138   CALCIUM 10.3 01/26/2022 1138   PROT 6.5 01/26/2022 1138   ALBUMIN 4.6 01/26/2022 1138   AST 17 01/26/2022 1138   ALT 14 01/26/2022 1138   ALKPHOS 74 01/26/2022 1138   BILITOT 0.4 01/26/2022 1138   GFRNONAA >60 10/05/2021 0735   GFRAA 57 (L) 03/26/2020 1125       Assessment & Plan:  77 year old female with a history of GERD, jackhammer esophagus, SIBO, gastritis, previous constipation who is here for follow-up.   Chest tightness/GERD and jackhammer esophagus --her symptoms are most consistent with jackhammer esophagus and esophageal spasm.  We discussed this today.  She has had definitive manometric evidence of this condition in  the past.  She is not taking acid suppression and given that symptoms worsen when she bends over I think we need to try acid suppression first and if no improvement then try a calcium channel blocker.  We discussed the rationale for this today -- Pantoprazole 40 mg once daily; I would like her to try this at least through 04/07/2022 and then let me know if symptoms fail to improve -- If they are somewhat but not completely better I would add low-dose diltiazem 4 times a day with knowledge we may need to reduce metoprolol dose  2.  Abdominal bloating --history of SIBO.  Bloating responded to antibiotic treatment in the past.  Will defer this for now as we treat #1.  More chronic abdominal pain has improved.  3.  History of colon polyps  --colonoscopy done just last year.  He will not require further surveillance based on age.  I would like to see her back in around 3 months if possible  30 minutes total spent today including patient facing time, coordination of care, reviewing medical history/procedures/pertinent radiology studies, and documentation of the encounter.

## 2022-03-23 NOTE — Patient Instructions (Signed)
_______________________________________________________  If your blood pressure at your visit was 140/90 or greater, please contact your primary care physician to follow up on this.  If you are age 77 or older, your body mass index should be between 23-30. Your Body mass index is 37.66 kg/m. If this is out of the aforementioned range listed, please consider follow up with your Primary Care Provider. ________________________________________________________  The [facility] providers would like to encourage you to use [facility] to communicate with providers for non-urgent requests or questions.  Due to long hold times on the telephone, sending your provider a message by [facility] may be a faster and more efficient way to get a response.  Please allow 48 business hours for a response.  Please remember that this is for non-urgent requests.  _______________________________________________________  We have sent the following medications to your pharmacy for you to pick up at your convenience:  START: pantoprazole 12m one tablet daily  Please contact our office on 04-07-22 to report on how you are feeling.  Thank you for entrusting me with your care and [facility]  Dr PHilarie Fredrickson

## 2022-04-06 ENCOUNTER — Telehealth: Payer: Self-pay | Admitting: Internal Medicine

## 2022-04-06 NOTE — Telephone Encounter (Signed)
Returned call to patient who has complaints of feeling worse since she started pantoprazole '40mg'$  daily.  She continues to have chest tightness with reflux after consuming a meal.  Patient has complaints of feeling bloated since starting medication. She is taking pantoprazole 30 minutes prior to breakfast meal.  Patient advised I will reach out to Dr Hilarie Fredrickson for further recommendations.  Please advise.

## 2022-04-06 NOTE — Telephone Encounter (Signed)
Inbound call from patient stating that she seen Dr. Hilarie Fredrickson on 2/15 and was proscribed Protonix and ir seems to not be helping. Patient stated he mentioned another medication if Protonix did not work and is wanting to know if he can switch the medication. Please advise.

## 2022-04-10 NOTE — Telephone Encounter (Signed)
Will discontinue pantoprazole  Try lansoprazole 30 mg daily to replace pantoprazole  Try diltiazem short acting 30 mg every 8 hours (3 times a day) for nutcracker/jackhammer esophagus to see if this helps with esophageal spasm symptoms.  Have her try this for 7 to 14 days and let us know if it seems to be helping or if she has any trouble with these meds

## 2022-04-11 MED ORDER — DILTIAZEM HCL 30 MG PO TABS: 30.0000 mg | ORAL_TABLET | Freq: Three times a day (TID) | ORAL | 1 refills | Status: DC

## 2022-04-11 MED ORDER — LANSOPRAZOLE 30 MG PO CPDR
30.0000 mg | DELAYED_RELEASE_CAPSULE | Freq: Every day | ORAL | 1 refills | Status: DC
Start: 2022-04-11 — End: 2022-07-20

## 2022-04-11 NOTE — Telephone Encounter (Signed)
Patient advised to discontinue pantoprazole and start lansoprazole 30 mg daily.  Patient advised to start diltiazem short acting 30 mg every 8 hours (3 times a day) for nutcracker/jackhammer esophagus to see if this helps with esophageal spasm symptoms. Medications sent to pharmacy as requested.  Patient instructions to try these medication changes for 7 to 14 days and she will let us know if it seems to be helping or if she has any trouble with these medications.  Patient verbalized understanding.  No further questions.

## 2022-04-11 NOTE — Addendum Note (Signed)
Addended by: Stevan Born on: 04/11/2022 12:02 PM   Modules accepted: Orders

## 2022-05-04 ENCOUNTER — Other Ambulatory Visit: Payer: Self-pay | Admitting: Internal Medicine

## 2022-05-12 DIAGNOSIS — M25561 Pain in right knee: Secondary | ICD-10-CM | POA: Diagnosis not present

## 2022-05-15 ENCOUNTER — Other Ambulatory Visit: Payer: Self-pay | Admitting: Gastroenterology

## 2022-06-06 ENCOUNTER — Other Ambulatory Visit: Payer: Self-pay | Admitting: Internal Medicine

## 2022-06-19 DIAGNOSIS — H11421 Conjunctival edema, right eye: Secondary | ICD-10-CM | POA: Diagnosis not present

## 2022-06-19 DIAGNOSIS — H16223 Keratoconjunctivitis sicca, not specified as Sjogren's, bilateral: Secondary | ICD-10-CM | POA: Diagnosis not present

## 2022-06-20 DIAGNOSIS — L570 Actinic keratosis: Secondary | ICD-10-CM | POA: Diagnosis not present

## 2022-06-20 DIAGNOSIS — D224 Melanocytic nevi of scalp and neck: Secondary | ICD-10-CM | POA: Diagnosis not present

## 2022-06-20 DIAGNOSIS — L438 Other lichen planus: Secondary | ICD-10-CM | POA: Diagnosis not present

## 2022-06-20 DIAGNOSIS — L720 Epidermal cyst: Secondary | ICD-10-CM | POA: Diagnosis not present

## 2022-06-20 DIAGNOSIS — D2271 Melanocytic nevi of right lower limb, including hip: Secondary | ICD-10-CM | POA: Diagnosis not present

## 2022-06-20 DIAGNOSIS — L821 Other seborrheic keratosis: Secondary | ICD-10-CM | POA: Diagnosis not present

## 2022-06-20 DIAGNOSIS — B0089 Other herpesviral infection: Secondary | ICD-10-CM | POA: Diagnosis not present

## 2022-06-20 DIAGNOSIS — D692 Other nonthrombocytopenic purpura: Secondary | ICD-10-CM | POA: Diagnosis not present

## 2022-06-20 DIAGNOSIS — Z85828 Personal history of other malignant neoplasm of skin: Secondary | ICD-10-CM | POA: Diagnosis not present

## 2022-06-26 DIAGNOSIS — H16223 Keratoconjunctivitis sicca, not specified as Sjogren's, bilateral: Secondary | ICD-10-CM | POA: Diagnosis not present

## 2022-06-26 DIAGNOSIS — H11421 Conjunctival edema, right eye: Secondary | ICD-10-CM | POA: Diagnosis not present

## 2022-07-13 DIAGNOSIS — F3341 Major depressive disorder, recurrent, in partial remission: Secondary | ICD-10-CM | POA: Diagnosis not present

## 2022-07-13 DIAGNOSIS — E785 Hyperlipidemia, unspecified: Secondary | ICD-10-CM | POA: Diagnosis not present

## 2022-07-13 DIAGNOSIS — I10 Essential (primary) hypertension: Secondary | ICD-10-CM | POA: Diagnosis not present

## 2022-07-13 DIAGNOSIS — I5032 Chronic diastolic (congestive) heart failure: Secondary | ICD-10-CM | POA: Diagnosis not present

## 2022-07-17 DIAGNOSIS — M25561 Pain in right knee: Secondary | ICD-10-CM | POA: Diagnosis not present

## 2022-07-18 NOTE — Progress Notes (Unsigned)
Cardiology Office Note:   Date:  07/20/2022  ID:  Alison Ayala, DOB 1945-06-21, MRN [id number]  History of Present Illness:   Alison Ayala is a 77 y.o. female with history of CAD s/p DES to LAD in 04/2020, HTN, HLD, and HFpEF who presents to clinic for follow-up.  Per review of the record, patient had coronary CTA 03/2020 that showed severe stenosis in ostial LAD. She underwent LHC on 04/08/2020 which revealed severe ostial/proximal LAD stenosis treated successfully with PTCA/DES x1, no obstructive disease in large dominant circumflex artery, small nondominant RCA, normal LV function, no mitral regurgitation, LVEDP normal.   Was admitted in 09/2021 with chest pain. Repeat cath 10/05/2021 which revealed 20% ostial left circumflex lesion with widely patent LAD stent with no significant in-stent restenosis. No culprit lesion for symptoms. Mildly elevated LVEDP. It was felt she had noncardiac chest pain with recommendations to continue medical therapy.   Was last seen by Eligha Bridegroom, NP where her chest pain had improved with Wellbutrin.  Today, the patient states that she feels okay. Continues to feel tired and worn out. Has occasional brief episodes of nonexertional chest pain that lasts a few minutes and then resolves without issue. Has mild dyspnea on exertion that is not progressing. Has occasional edema but well controlled with the lasix. No orthopnea, PND or palpitations.  Past Medical History:  Diagnosis Date   CAD (coronary artery disease)    a. s/p DES to LAD 04/2020.   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HCC)    Diverticulosis    GERD (gastroesophageal reflux disease)    H/O hiatal hernia    H/O intraventricular hemorrhage 2004--  SPONTANEOUS   S/P SHUNT   Heart murmur    Hiatal hernia    Hip pain, right OCCASIONAL -- PINCHED NERVE   History of gastric ulcer 2001   Hyperlipidemia    Hypertension    Hypoglycemia    Internal hemorrhoids     Interstitial lung disease (HCC)    Jackhammer esophagus    Mediastinal mass    Mitral valve prolapse    Normal echocardiogram 30 YRS AGO   NSIP (nonspecific interstitial pneumonia) (HCC)    S/P ventriculoperitoneal shunt 2004--- PER PT SHUNT IS WORKING WELL --  NO ISSUES   SAH (subarachnoid hemorrhage) (HCC)    Squamous cell cancer of scalp and skin of neck    mole back of head   Tubular adenoma of colon      ROS: As per HPI  Studies Reviewed:    EKG:  No new tracing  Cardiac Studies & Procedures   CARDIAC CATHETERIZATION  CARDIAC CATHETERIZATION 10/05/2021  Narrative   Ost Cx lesion is 20% stenosed.   Non-stenotic Ost LAD to Prox LAD lesion was previously treated.  1.  Widely patent ostial LAD stent with no significant restenosis.  The stent extends 1 to 2 mm into the left main with partial jailing of the left circumflex.  However, there is only mild ostial left circumflex stenosis.  The coronary arteries are otherwise normal. 2.  Left ventricular angiography was not performed.  LVEDP was mildly elevated.  Recommendations: Suspect noncardiac chest pain. Continue medical therapy.  Findings Coronary Findings Diagnostic  Dominance: Left  Left Anterior Descending Vessel is large. Non-stenotic Ost LAD to Prox LAD lesion was previously treated.  Left Circumflex Vessel is large. Ost Cx lesion is 20% stenosed.  Right Coronary Artery Vessel is small.  Intervention  No interventions  have been documented.   CARDIAC CATHETERIZATION  CARDIAC CATHETERIZATION 04/08/2020  Narrative  Ost LAD to Prox LAD lesion is 99% stenosed.  A drug-eluting stent was successfully placed using a SYNERGY XD 4.0X16.  Post intervention, there is a 0% residual stenosis.  The left ventricular systolic function is normal.  LV end diastolic pressure is normal.  The left ventricular ejection fraction is 55-65% by visual estimate.  There is no mitral valve regurgitation.  1. Severe  ostial/proximal LAD stenosis 2. Successful PTCA/DES x 1 ostial/proximal LAD with optimization with OCT imaging 3. No obstructive disease in the large dominant Circumflex artery. 4. Small non-dominant RCA 5. Normal LV systolic function  Recommendations: Continue DAPT with ASA and Brilinta for at least six months but preferably one year if she tolerates it well. If necessary, the Brilinta could be changed to Plavix in 3 months. Same day post PCI discharge.  Findings Coronary Findings Diagnostic  Dominance: Left  Left Anterior Descending Vessel is large. Ost LAD to Prox LAD lesion is 99% stenosed.  Left Circumflex Vessel is large.  Right Coronary Artery Vessel is small.  Intervention  Ost LAD to Prox LAD lesion Stent CATH VISTA GUIDE 6FR XBLAD3.5 guide catheter was inserted. Lesion crossed with guidewire using a WIRE COUGAR XT STRL 190CM. Pre-stent angioplasty was performed using a BALLOON SAPPHIRE 2.5X15. A drug-eluting stent was successfully placed using a SYNERGY XD 4.0X16. Stent strut is well apposed. Post-stent angioplasty was performed using a BALLOON SAPPHIRE [place] 4.0X8. Post-Intervention Lesion Assessment The intervention was successful. Pre-interventional TIMI flow is 3. Post-intervention TIMI flow is 3. No complications occurred at this lesion. There is a 0% residual stenosis post intervention.   STRESS TESTS  EXERCISE TOLERANCE TEST (ETT) 07/25/2017  Narrative  Blood pressure demonstrated a hypertensive response to exercise.  There was no ST segment deviation noted during stress.  No T wave inversion was noted during stress.  Blood pressure demonstrated a hypertensive response to exercise  Overall, the patient's exercise capacity was moderately impaired.  The patient's response to exercise was inadequate for diagnosis  Arrhythmias during stress: occasional PVCs  Duke Treadmill Score: intermediate risk  Indeterminate stress test - patient did not reach THR.  Poor exercise capacity limited by dyspnea. No diagnostic ischemic EKG changes at given workload. PVC's noted with exercise and in recovery. Consider further ischemic evaluation with pharmacologic perfusion, CT coronary angiography or coronary angiography if necessary.   ECHOCARDIOGRAM  ECHOCARDIOGRAM COMPLETE 07/03/2020  Narrative ECHOCARDIOGRAM REPORT    Patient Name:   TANESA BROUS Ketter Date of Exam: 07/03/2020 Medical Rec #:  [id number]         Height:       64.0 in Accession #:    [id number]        Weight:       212.7 lb Date of Birth:  [birth year]         BSA:          2.008 m Patient Age:    75 years          BP:           127/64 mmHg Patient Gender: F                 HR:           60 bpm. Exam Location:  Inpatient  Procedure: 2D Echo, Cardiac Doppler and Color Doppler  Indications:    Dyspnea  History:        Patient has prior  history of Echocardiogram examinations, most recent 07/25/2017. CAD, Mitral Valve Disease and Aortic Valve Disease; Risk Factors:Hypertension and Dyslipidemia. S/P DES to LAD 04/2020. GERD. DOE.  Sonographer:    Ross Ludwig RDCS (AE) Referring Phys: [id number] DAYNA N DUNN  IMPRESSIONS   1. Left ventricular ejection fraction, by estimation, is 60 to 65%. The left ventricle has normal function. The left ventricle has no regional wall motion abnormalities. There is moderate left ventricular hypertrophy of the basal-septal segment. Left ventricular diastolic parameters are consistent with Grade I diastolic dysfunction (impaired relaxation). 2. Right ventricular systolic function is normal. The right ventricular size is normal. There is mildly elevated pulmonary artery systolic pressure. The estimated right ventricular systolic pressure is 38.3 mmHg. 3. The mitral valve is normal in structure. Mild mitral valve regurgitation. No evidence of mitral stenosis. 4. The aortic valve is normal in structure. There is mild calcification of the aortic valve. There is mild  thickening of the aortic valve. Aortic valve regurgitation is mild to moderate. Mild to moderate aortic valve sclerosis/calcification is present, without any evidence of aortic stenosis. Aortic valve mean gradient measures 6.0 mmHg. Aortic valve Vmax measures 1.65 m/s. 5. The inferior vena cava is normal in size with greater than 50% respiratory variability, suggesting right atrial pressure of 3 mmHg.  FINDINGS Left Ventricle: Left ventricular ejection fraction, by estimation, is 60 to 65%. The left ventricle has normal function. The left ventricle has no regional wall motion abnormalities. The left ventricular internal cavity size was normal in size. There is moderate left ventricular hypertrophy of the basal-septal segment. Left ventricular diastolic parameters are consistent with Grade I diastolic dysfunction (impaired relaxation).  Right Ventricle: The right ventricular size is normal. No increase in right ventricular wall thickness. Right ventricular systolic function is normal. There is mildly elevated pulmonary artery systolic pressure. The tricuspid regurgitant velocity is 2.97 m/s, and with an assumed right atrial pressure of 3 mmHg, the estimated right ventricular systolic pressure is 38.3 mmHg.  Left Atrium: Left atrial size was normal in size.  Right Atrium: Right atrial size was normal in size.  Pericardium: There is no evidence of pericardial effusion.  Mitral Valve: The mitral valve is normal in structure. Mild mitral valve regurgitation. No evidence of mitral valve stenosis. MV peak gradient, 5.1 mmHg. The mean mitral valve gradient is 2.0 mmHg.  Tricuspid Valve: The tricuspid valve is normal in structure. Tricuspid valve regurgitation is trivial. No evidence of tricuspid stenosis.  Aortic Valve: The aortic valve is normal in structure. There is mild calcification of the aortic valve. There is mild thickening of the aortic valve. Aortic valve regurgitation is mild to moderate.  Aortic regurgitation PHT measures 606 msec. Mild to moderate aortic valve sclerosis/calcification is present, without any evidence of aortic stenosis. Aortic valve mean gradient measures 6.0 mmHg. Aortic valve peak gradient measures 10.9 mmHg. Aortic valve area, by VTI measures 2.97 cm.  Pulmonic Valve: The pulmonic valve was normal in structure. Pulmonic valve regurgitation is not visualized. No evidence of pulmonic stenosis.  Aorta: The aortic root is normal in size and structure.  Venous: The inferior vena cava is normal in size with greater than 50% respiratory variability, suggesting right atrial pressure of 3 mmHg.  IAS/Shunts: No atrial level shunt detected by color flow Doppler.   LEFT VENTRICLE PLAX 2D LVIDd:         4.30 cm  Diastology LVIDs:         2.40 cm  LV e' medial:  7.07 cm/s LV PW:         1.00 cm  LV E/e' medial:  11.3 LV IVS:        1.60 cm  LV e' lateral:   7.62 cm/s LVOT diam:     2.00 cm  LV E/e' lateral: 10.5 LV SV:         101 LV SV Index:   51 LVOT Area:     3.14 cm   RIGHT VENTRICLE             IVC RV Basal diam:  3.30 cm     IVC diam: 1.20 cm RV S prime:     12.10 cm/s TAPSE (M-mode): 2.0 cm  LEFT ATRIUM             Index       RIGHT ATRIUM           Index LA diam:        3.30 cm 1.64 cm/m  RA Area:     18.20 cm LA Vol (A2C):   65.8 ml 32.76 ml/m RA Volume:   54.00 ml  26.89 ml/m LA Vol (A4C):   46.9 ml 23.35 ml/m LA Biplane Vol: 60.7 ml 30.22 ml/m AORTIC VALVE AV Area (Vmax):    2.80 cm AV Area (Vmean):   2.97 cm AV Area (VTI):     2.97 cm AV Vmax:           165.00 cm/s AV Vmean:          111.000 cm/s AV VTI:            0.342 m AV Peak Grad:      10.9 mmHg AV Mean Grad:      6.0 mmHg LVOT Vmax:         147.00 cm/s LVOT Vmean:        105.000 cm/s LVOT VTI:          0.323 m LVOT/AV VTI ratio: 0.94 AI PHT:            606 msec  AORTA Ao Root diam: 3.00 cm Ao Asc diam:  3.10 cm  MITRAL VALVE               TRICUSPID VALVE MV  Area (PHT): 3.03 cm    TR Peak grad:   35.3 mmHg MV Area VTI:   2.71 cm    TR Vmax:        297.00 cm/s MV Peak grad:  5.1 mmHg MV Mean grad:  2.0 mmHg    SHUNTS MV Vmax:       1.13 m/s    Systemic VTI:  0.32 m MV Vmean:      63.4 cm/s   Systemic Diam: 2.00 cm MV Decel Time: 250 msec MV E velocity: 79.70 cm/s MV A velocity: 97.30 cm/s MV E/A ratio:  0.82  Donato Schultz MD Electronically signed by Donato Schultz MD Signature Date/Time: 07/03/2020/6:44:26 PM    Final     CT SCANS  CT CORONARY MORPH W/CTA COR W/SCORE 03/31/2020  Addendum 03/31/2020 12:35 PM ADDENDUM REPORT: 03/31/2020 12:33  CLINICAL DATA:  40F with hypertension, hyperlipidemia, mitral valve prolapse and a mediastinal mass for preoperative risk assessment.  EXAM: Cardiac/Coronary  CT  TECHNIQUE: The patient was scanned on a [employer].  FINDINGS: A 120 kV prospective scan was triggered in the descending thoracic aorta at 111 HU's. Axial non-contrast 3 mm slices were carried out through the heart. The data set  was analyzed on a dedicated work station and scored using the [employer] . Gantry rotation speed was 250 msecs and collimation was .6 mm. No beta blockade and 0.8 mg of sl NTG was given. The 3D data set was reconstructed in 5% intervals of the 67-82 % of the R-R cycle. Diastolic phases were analyzed on a dedicated work station using MPR, MIP and VRT modes. The patient received 80 cc of contrast.  Aorta: Normal size. Calcification of the aortic root and descending aorta. No dissection.  Aortic Valve:  Trileaflet.  No calcifications.  Coronary Arteries:  Normal coronary origin.  Right dominance.  RCA is a small, non-dominant artery.  There is no plaque.  Left main is a large artery that gives rise to LAD, RI, and LCX arteries. Minimal (<25%) calcified plaque.  LAD is a large vessel that has severe (>70%) soft plaque at the ostium. There is a tiny D1 that is diffusely diseased. D2 and  D3 are small vessels without significant disease.  LCX is a dominant artery that gives rise to one large OM1 branch and L-PDA. OM1 tapers abruptly and may be occluded but is difficult to evaluate. There is no significant disease in the LCX or L-PDA.  Other findings:  Normal pulmonary vein drainage into the left atrium.  Normal let atrial appendage without a thrombus.  Normal size of the pulmonary artery.  Mild mitral annular calcification.  IMPRESSION: 1. Coronary calcium score of 0.687. This was 26th percentile for age and sex matched control.  2. Normal coronary origin with left dominance.  3. There is severe (>70%) soft plaque in the ostial LAD. CAD-RADS 4.  4.  Will send for FFRct.  Chilton Si, MD   Electronically Signed By: Chilton Si On: 03/31/2020 12:33  Narrative EXAM: OVER-READ INTERPRETATION  CT CHEST  The following report is an over-read performed by radiologist Dr. Trudie Reed of [facility] on 03/30/2020. This over-read does not include interpretation of cardiac or coronary anatomy or pathology. The coronary calcium score/coronary CTA interpretation by the cardiologist is attached.  COMPARISON:  None.  FINDINGS: Aortic atherosclerosis. Moderate hiatal hernia. Dependent areas of subsegmental atelectasis in the right lower lobe. Within the visualized portions of the thorax there are no suspicious appearing pulmonary nodules or masses, there is no acute consolidative airspace disease, no pleural effusions, no pneumothorax and no lymphadenopathy. Visualized portions of the upper abdomen are unremarkable. There are no aggressive appearing lytic or blastic lesions noted in the visualized portions of the skeleton. Median sternotomy wires. VP shunt catheter in the subcutaneous fat of the right anterior chest wall incidentally noted.  IMPRESSION: 1.  Aortic Atherosclerosis (ICD10-I70.0). 2. Moderate-sized hiatal  hernia.  Electronically Signed: By: Trudie Reed M.D. On: 03/30/2020 13:25           Risk Assessment/Calculations:              Physical Exam:   VS:  BP 130/60   Pulse 62   Ht 5' 3.5" (1.613 m)   Wt 215 lb 9.6 oz (97.8 kg)   SpO2 97%   BMI 37.59 kg/m    Wt Readings from Last 3 Encounters:  07/20/22 215 lb 9.6 oz (97.8 kg)  03/23/22 216 lb (98 kg)  03/06/22 215 lb 3.2 oz (97.6 kg)     GEN: Well nourished, well developed in no acute distress NECK: No JVD; No carotid bruits CARDIAC: RRR, 2/6 systolic murmur. No rubs, gallops RESPIRATORY:  Clear to auscultation without rales,  wheezing or rhonchi  ABDOMEN: Soft, non-tender, non-distended EXTREMITIES:  No edema; No deformity   ASSESSMENT AND PLAN:   #CAD: -Patient with ostial-prox LAD stenosis s/p PCI in 04/2020 -Most recent cath 09/2021 with patent stent and no obstructive disease elsewhere -Currently, doing well without anginal symptoms -Continue metop 100mg  XL daily -Continue plavix 75mg  daily -Continue crestor 20mg  daily -Continue valsartan 320mg  daily  #Chronic Diastolic HF: -TTE 06/2020 with LVEF 60-65%, G1DD -Change lasix to 20mg  M, W, F with extra dose as needed -Start spironolactone 25mg  daily -Hold potassium while on spiro -Repeat BMET in 10days for monitoring -Low Na diet  #HTN: -Continue valsartan 320mg  daily -Continue metop 100mg  XL daily -Start spironolactone 25mg  daily -She will start monitoring Bps at home  #HLD: -Continue crestor 20mg  daily -LDL controlled at 69  #Mild to Moderate AR: -Repeat TTE for monitoring        Signed, Meriam Sprague, MD

## 2022-07-20 ENCOUNTER — Encounter: Payer: Self-pay | Admitting: Cardiology

## 2022-07-20 ENCOUNTER — Ambulatory Visit: Payer: Medicare HMO | Attending: Cardiology | Admitting: Cardiology

## 2022-07-20 VITALS — BP 130/60 | HR 62 | Ht 63.5 in | Wt 215.6 lb

## 2022-07-20 DIAGNOSIS — I2510 Atherosclerotic heart disease of native coronary artery without angina pectoris: Secondary | ICD-10-CM | POA: Diagnosis not present

## 2022-07-20 DIAGNOSIS — I5032 Chronic diastolic (congestive) heart failure: Secondary | ICD-10-CM | POA: Diagnosis not present

## 2022-07-20 DIAGNOSIS — E785 Hyperlipidemia, unspecified: Secondary | ICD-10-CM | POA: Diagnosis not present

## 2022-07-20 DIAGNOSIS — I10 Essential (primary) hypertension: Secondary | ICD-10-CM | POA: Diagnosis not present

## 2022-07-20 DIAGNOSIS — I351 Nonrheumatic aortic (valve) insufficiency: Secondary | ICD-10-CM

## 2022-07-20 MED ORDER — SPIRONOLACTONE 25 MG PO TABS: 25.0000 mg | ORAL_TABLET | Freq: Every day | ORAL | 3 refills | Status: DC

## 2022-07-20 MED ORDER — FUROSEMIDE 20 MG PO TABS: ORAL_TABLET | ORAL | 1 refills | Status: DC

## 2022-07-20 NOTE — Patient Instructions (Signed)
Medication Instructions: Stop Potassium Decrease  lasix 20mg  to take on Monday, Wednesday, Friday. Can take additional one tablet on those days as needed. Start Spironolactone 25 mg daily  *If you need a refill on your cardiac medications before your next appointment, please call your pharmacy*   Lab Work: Bmet in one week If you have labs (blood work) drawn today and your tests are completely normal, you will receive your results only by: MyChart Message (if you have MyChart) OR A paper copy in the mail If you have any lab test that is abnormal or we need to change your treatment, we will call you to review the results.   Testing/Procedures: Your physician has requested that you have an echocardiogram. Echocardiography is a painless test that uses sound waves to create images of your heart. It provides your doctor with information about the size and shape of your heart and how well your heart's chambers and valves are working. This procedure takes approximately one hour. There are no restrictions for this procedure. Please do NOT wear cologne, perfume, aftershave, or lotions (deodorant is allowed). Please arrive 15 minutes prior to your appointment time.    Follow-Up: At [facility], you and your health needs are our priority.  As part of our continuing mission to provide you with exceptional heart care, we have created designated Provider Care Teams.  These Care Teams include your primary Cardiologist (physician) and Advanced Practice Providers (APPs -  Physician Assistants and Nurse Practitioners) who all work together to provide you with the care you need, when you need it.  We recommend signing up for the patient portal called "MyChart".  Sign up information is provided on this After Visit Summary.  MyChart is used to connect with patients for Virtual Visits (Telemedicine).  Patients are able to view lab/test results, encounter notes, upcoming appointments, etc.  Non-urgent  messages can be sent to your provider as well.   To learn more about what you can do with MyChart, go to [email or web address].    Your next appointment:   6 month(s)  Provider:   Dr. Jacques Navy

## 2022-07-27 ENCOUNTER — Ambulatory Visit: Payer: Medicare HMO | Attending: Cardiology

## 2022-07-27 DIAGNOSIS — I351 Nonrheumatic aortic (valve) insufficiency: Secondary | ICD-10-CM

## 2022-07-27 DIAGNOSIS — I2510 Atherosclerotic heart disease of native coronary artery without angina pectoris: Secondary | ICD-10-CM | POA: Diagnosis not present

## 2022-07-27 DIAGNOSIS — I10 Essential (primary) hypertension: Secondary | ICD-10-CM | POA: Diagnosis not present

## 2022-07-27 DIAGNOSIS — I5032 Chronic diastolic (congestive) heart failure: Secondary | ICD-10-CM

## 2022-07-27 DIAGNOSIS — E785 Hyperlipidemia, unspecified: Secondary | ICD-10-CM

## 2022-07-27 LAB — BASIC METABOLIC PANEL
BUN/Creatinine Ratio: 24 (ref 12–28)
BUN: 27 mg/dL (ref 8–27)
CO2: 24 mmol/L (ref 20–29)
Calcium: 10.5 mg/dL — ABNORMAL HIGH (ref 8.7–10.3)
Chloride: 106 mmol/L (ref 96–106)
Creatinine, Ser: 1.11 mg/dL — ABNORMAL HIGH (ref 0.57–1.00)
Glucose: 102 mg/dL — ABNORMAL HIGH (ref 70–99)
Potassium: 4.6 mmol/L (ref 3.5–5.2)
Sodium: 142 mmol/L (ref 134–144)
eGFR: 51 mL/min/{1.73_m2} — ABNORMAL LOW (ref >59)

## 2022-08-08 ENCOUNTER — Other Ambulatory Visit: Payer: Self-pay | Admitting: Cardiovascular Disease

## 2022-08-14 DIAGNOSIS — R7303 Prediabetes: Secondary | ICD-10-CM | POA: Diagnosis not present

## 2022-08-14 DIAGNOSIS — R61 Generalized hyperhidrosis: Secondary | ICD-10-CM | POA: Diagnosis not present

## 2022-08-14 DIAGNOSIS — I10 Essential (primary) hypertension: Secondary | ICD-10-CM | POA: Diagnosis not present

## 2022-08-14 DIAGNOSIS — J849 Interstitial pulmonary disease, unspecified: Secondary | ICD-10-CM | POA: Diagnosis not present

## 2022-08-14 DIAGNOSIS — I5032 Chronic diastolic (congestive) heart failure: Secondary | ICD-10-CM | POA: Diagnosis not present

## 2022-08-14 DIAGNOSIS — F3341 Major depressive disorder, recurrent, in partial remission: Secondary | ICD-10-CM | POA: Diagnosis not present

## 2022-08-14 DIAGNOSIS — I110 Hypertensive heart disease with heart failure: Secondary | ICD-10-CM | POA: Diagnosis not present

## 2022-08-18 ENCOUNTER — Ambulatory Visit (HOSPITAL_COMMUNITY): Payer: Medicare HMO | Attending: Cardiology

## 2022-08-18 ENCOUNTER — Telehealth: Payer: Self-pay | Admitting: *Deleted

## 2022-08-18 DIAGNOSIS — I340 Nonrheumatic mitral (valve) insufficiency: Secondary | ICD-10-CM

## 2022-08-18 DIAGNOSIS — I351 Nonrheumatic aortic (valve) insufficiency: Secondary | ICD-10-CM

## 2022-08-18 DIAGNOSIS — I071 Rheumatic tricuspid insufficiency: Secondary | ICD-10-CM

## 2022-08-18 LAB — ECHOCARDIOGRAM COMPLETE
Area-P 1/2: 3.48 cm2
MV M vel: 5.14 m/s
MV Peak grad: 105.7 mmHg
P 1/2 time: 381 msec
Radius: 0.6 cm
S' Lateral: 2.7 cm

## 2022-08-18 NOTE — Telephone Encounter (Signed)
The patient has been notified of the result and verbalized understanding.  All questions (if any) were answered.  Pt aware we will order for her to get a repeat echo done in one year for surveillance.  She is aware that I will go ahead and place the order and have our echo scheduler call her back to arrange this appt.   Pt verbalized understanding and agrees with this plan.

## 2022-08-18 NOTE — Telephone Encounter (Signed)
-----   Message from Meriam Sprague sent at 08/18/2022  2:01 PM EDT ----- Her echo shows normal pumping function. There continues to be moderate leakiness of the aortic valve, mitral valve and tricuspid valves. This will not cause symptoms but is something we will need to monitor closely going forward with a repeat echo in 1 year.

## 2022-09-07 ENCOUNTER — Other Ambulatory Visit: Payer: Self-pay | Admitting: Internal Medicine

## 2022-11-02 ENCOUNTER — Other Ambulatory Visit: Payer: Self-pay | Admitting: Cardiovascular Disease

## 2022-11-04 ENCOUNTER — Other Ambulatory Visit: Payer: Self-pay | Admitting: Cardiovascular Disease

## 2022-11-24 ENCOUNTER — Ambulatory Visit: Payer: Medicare HMO | Admitting: Internal Medicine

## 2022-11-24 DIAGNOSIS — J849 Interstitial pulmonary disease, unspecified: Secondary | ICD-10-CM

## 2022-11-24 DIAGNOSIS — J8489 Other specified interstitial pulmonary diseases: Secondary | ICD-10-CM

## 2022-11-24 LAB — PULMONARY FUNCTION TEST
DL/VA % pred: 84 %
DL/VA: 3.49 ml/min/mmHg/L
DLCO cor % pred: 76 %
DLCO cor: 14.07 ml/min/mmHg
DLCO unc % pred: 76 %
DLCO unc: 14.07 ml/min/mmHg
FEF 25-75 Pre: 2.57 L/s
FEF2575-%Pred-Pre: 171 %
FEV1-%Pred-Pre: 111 %
FEV1-Pre: 2.18 L
FEV1FVC-%Pred-Pre: 114 %
FEV6-%Pred-Pre: 103 %
FEV6-Pre: 2.56 L
FEV6FVC-%Pred-Pre: 105 %
FVC-%Pred-Pre: 97 %
FVC-Pre: 2.56 L
Pre FEV1/FVC ratio: 85 %
Pre FEV6/FVC Ratio: 100 %

## 2022-11-24 NOTE — Progress Notes (Signed)
Spirometry/DLCO performed today. 

## 2022-11-24 NOTE — Patient Instructions (Signed)
Spirometry/DLCO performed today. 

## 2022-11-28 DIAGNOSIS — Z85828 Personal history of other malignant neoplasm of skin: Secondary | ICD-10-CM | POA: Diagnosis not present

## 2022-11-28 DIAGNOSIS — L821 Other seborrheic keratosis: Secondary | ICD-10-CM | POA: Diagnosis not present

## 2022-11-28 DIAGNOSIS — B078 Other viral warts: Secondary | ICD-10-CM | POA: Diagnosis not present

## 2022-11-28 DIAGNOSIS — D485 Neoplasm of uncertain behavior of skin: Secondary | ICD-10-CM | POA: Diagnosis not present

## 2022-11-28 DIAGNOSIS — C44729 Squamous cell carcinoma of skin of left lower limb, including hip: Secondary | ICD-10-CM | POA: Diagnosis not present

## 2022-12-11 ENCOUNTER — Encounter: Payer: Self-pay | Admitting: Internal Medicine

## 2022-12-11 ENCOUNTER — Ambulatory Visit: Payer: Medicare HMO | Admitting: Internal Medicine

## 2022-12-11 VITALS — BP 134/66 | HR 67 | Ht 63.5 in | Wt 213.0 lb

## 2022-12-11 DIAGNOSIS — J8489 Other specified interstitial pulmonary diseases: Secondary | ICD-10-CM | POA: Diagnosis not present

## 2022-12-11 DIAGNOSIS — Z7185 Encounter for immunization safety counseling: Secondary | ICD-10-CM

## 2022-12-11 DIAGNOSIS — Z9189 Other specified personal risk factors, not elsewhere classified: Secondary | ICD-10-CM

## 2022-12-11 DIAGNOSIS — J849 Interstitial pulmonary disease, unspecified: Secondary | ICD-10-CM | POA: Diagnosis not present

## 2022-12-11 NOTE — Progress Notes (Signed)
IOV 07/03/2017  Chief Complaint  Patient presents with   Pulm Consult    Referred by Dr. Laurann Montana for possible ILD. Per patient, she does have some occasional SOB and chest tightness.    Alison Ayala 77 y.o. female [address]  [place] [address] -referred for evaluation of potential interstitial lung disease.  According to the patient on routine exam it was felt that the sternal head of the clavicles was a little bit prominent so therefore a chest x-ray was done by the primary care physician which was not revealing.  Subsequently patient underwent CT scan of the chest documented below that I personally visualized and agree with the findings.  The 2 significant findings are anterior mediastinal mass with partial calcification and also possible interstitial lung disease and therefore she has been referred here.  She is denying any acute complaints . SHe has appt with Dr Morton Peters for anterior mediastinal mass  We performed the pulmonary integrated interstitial lung disease questionnaire and for the symptoms she denied any cough but she did admit to shortness of breath that started insidiously approximately 2 years ago and for the last 6 months is gradually gotten worse.  It is not episodic.  It is present on exertion and relieved by rest.  Today when we walked her she did drop her pulse ox greater than 3 points but did not go below 88% her heart rate did respond but it did not go over 90/min.  On a level 5 scale she rates level 3 dyspnea for picking up and straightening, sweeping and vacuuming walking up a hill and walking upstairs.  She rates a level 2 dyspnea for walking on a level ground at her own pace and walking with appears, making the bed, shopping and doing laundry.  She gives a level 1 dyspnea for standing up and dressing but otherwise all other activities a level 0.  Review of systems: She has chronic fatigue and arthralgia.  She also has dysphagia for the  last few to several years [4-5 years].  She has had dry eyes for the last few decades and dry mouth for the last 1 year.  She admits to acid reflux disease for the last 40 years and on and off cold sores in her mouth.  There is no recurrent fever or weight loss or Raynaud  Past medical history: Positive for acid reflux for several decades but negative for asthma, COPD, heart failure, rheumatoid arthritis, other collagen vascular disease, sleep apnea, immune system disorder, pulmonary hypertension, thyroid disease, stroke, seizures, hepatitis, tuberculosis, kidney disease, pneumonia or blood clots, heart disease pleurisy  Family history of lung disease: Denies any pulmonary fibrosis or any other lung disease  Exposure history: Denies any smoking, drugs of abuse.  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  In particular she denies any occupation that might predispose her to hypersensitivity pneumonitis or inorganic dust related interstitial lung disease.  Pulmonary toxicity history: Extensive drug list causing pulmonary toxicity reviewed.  And negative for all of that     CT chest 06/19/17 = personally visualized the CT chest and agree with the findings of large anterior mediastinal mass and possible ILD  - IMPRESSION: 1. No focal soft tissue mass or other abnormality to account for the  perceived palpable abnormality in the suprasternal region. 2. However, there is a large anterior mediastinal mass which demonstrates heterogeneous enhancement and calcification, with several smaller adjacent lesions. These findings are concerning for potential neoplasm, with primary differential considerations including germ cell neoplasm or lesion of thymic origin. On PET scan 07/16/17 - 4.6 cm anterior mediastinal mass with dense central calcifications  is hypermetabolic. SUV max is 6.55. Findings highly suspicious for a thymic neoplasmThoracic surgical consultation is strongly recommended. 3. 1.7 x 2.0 cm left adrenal nodule is indeterminate. Further characterization with nonemergent adrenal protocol CT scan is recommended in the near future to exclude the possibility of a metastatic lesion. 4. Unusual appearance of the lung parenchyma which is concerning for potential interstitial lung disease. Outpatient referral to pulmonology for further evaluation is suggested, as well as a follow-up high-resolution chest CT in 6-12 months to assess for temporal changes in the appearance of the lung parenchyma. 5. Multiple healing left-sided rib fractures, as above. These are all nondisplaced or minimally displaced. No associated pneumothorax noted at this time.     Electronically Signed   By: Trudie Reed M.D.   On: 06/20/2017 09:20   Results for GALI, SPINNEY (MRN [id number]) as of 07/03/2017 13:34  Ref. Range 07/26/2011 08:59  Hemoglobin Latest Ref Range: 12.0 - 15.0 g/dL 74.2 (H)    OV 5/95/6387  Chief Complaint  Patient presents with   Follow-up    Pt was hospitalized 7/2-7/7 due to having mediastinal mass resection and a lung biopsy was also performed. HRCT performed 6/3, PFT performed 6/5, and PET scan performed 6/10.  Pt states overall she has been doing good but is anxious to know about results from biopsy.    Follow-up mediastinal massAnteriorand PET hot Follow-up interstitial lung disease  She has now been discharged from post biopsy. She is doing well. She feels baseline. Shortness of breath with exertion is the same and it is mild. Walking desaturation test is documented below is unchanged. A biopsy date was 08/07/2017. The anterior mediastinal mass shows Castleman's disease. Based on PET scan I suspect this is unicentric. She has follow-up with thoracic service is pending. She also had open lung biopsy for early  interstitial lung disease. The formal report is still pending but I discussed with pathologist Dr. Kenard Gower who feels that this is very early and mild interstitial lung disease without any specific pattern but suspects this is early NSIP. There is no granuloma.     OV 11/26/2017  Subjective:  Patient ID: Alison Ayala, female , DOB: Oct 01, 1945 , age 23 y.o. , MRN: [id number] , ADDRESS: [address] [place] [address]   11/26/2017 -   Chief Complaint  Patient presents with   Follow-up    Doing well feeling the same as last visit.    Follow-up mediastinal massAnteriorand PET hot = Castleman's disease, you are eccentric based on PET scan, July 2019 Follow-up interstitial lung disease -likely NSIP, July 2019   HPI Alison Ayala 77 y.o. -at this point in time she returns for follow-up for interstitial lung disease.  Since her last visit in July 2019 we had a multidisciplinary case conference in August 2019.  Despite the air trapping seen on CT scan of the chest her lung biopsy has been read out as an SIP.  There was a tendency for some multinucleated giant cells but no clear-cut granuloma.  These are few and far between.  I did a history retake for hypersensitivity pneumonitis.  She  vehemently denies any mold or mildew exposure.  She does not use any wind instruments.  She does not do any farming or guarding.  No exposure to rotten wood on mulch.  No pet hamsters or gerbils or birds.  No using feather pillows.  No misty fountain in the house.  No nebulizer use no CPAP use.  No humidifier use.  The house is only 77 years old.  Even in her prior jobs there is no mold exposure.  She is currently exercising.  Her walking desaturation test seems to have improved.    OV 02/26/2018  Subjective:  Patient ID: Alison Ayala, female , DOB: [birth year] , age 8 y.o. , MRN: [id number] , ADDRESS: [address] [place] [address]  02/26/2018 -   Chief Complaint  Patient  presents with   Follow-up    Pt states she has been doing well since last visit. States she still becomes SOB with exertion.     HPI Alison Ayala 77 y.o. -seen October 2019 for the above issues.  She is not taking any prednisone.  She does not want to do it because of side effects.  In the interim she is continued to do well.  She has mild class II dyspnea on exertion relieved by rest.  This is stable.  No new interim issues you are up-to-date with a flu shot.  She had pulmonary function test that shows improvement in FVC with slight decline in DLCO/stable DLCO.  But overall she feels good.  Walking desaturation test shows no change.       07/27/2020- Interim hx  Patient was contacted today to review PFT results. Her breath test showed normal spirometry and DLCO. No evidence of worsening pulmonary fibrosis. She has been checking her oxygen at home and her readings are consistently >90%. Very rarely she will see her O2 drop to 89%. She has an apt with cardiology in approximately 1 week.    Observations/Objective:  - Able to speak in full sentences; no overt shortness of breath, wheezing or cough  Pulmonary function testing: 07/22/20 - FVC 2.59 (92%), FVC 2.26 (107%), ratio 87, DLCOcor 15.94 (83%)  02/26/2018-FVC 2.55 (88% predicted), ratio 85, FEV1 2.16 (99% predicted), DLCO 18.35 (75% predicted)  Assessment and Plan:  ILD: - Early NSIP, July 2019 - on surgical lung biopsy - No findings to indicate worsening ILD; CT imaging and PFTs remain stable - Due for repeat HRCT in May 2023  Acute respiratory failure: - Seems to have resolved, she has been monitoring her O2 saturation at home and reports O2 consistently remains >90% RA   Follow Up Instructions:  - FU with Dr. Marchelle Gearing in Dec 2022  OV 08/25/2021  Subjective:  Patient ID: Alison Ayala, female , DOB: 28-Mar-1945 , age 62 y.o. , MRN: [id number] , ADDRESS: [address] [place] [address] PCP Laurann Montana, MD Patient Care Team: Laurann Montana, MD as PCP - General (Family Medicine) Nahser, Deloris Ping, MD as PCP - Cardiology (Cardiology)  This Provider for this visit: Treatment Team:  Attending Provider: Kalman Shan, MD  Follow-up mediastinal mass Anteriorand PET hot = Castleman's disease,  based on PET scan, July 2019 - biopsy provine Follow-up interstitial lung disease -likely early/mind NSIP, July 2019 - on surgical lung biopsy  08/25/2021 -   Chief Complaint  Patient presents with   Follow-up    Follow up. Patient has no complaints.      HPI Alison Ayala [id number] 77  y.o. -personally not seen in 3 years but in the interim has followed with nurse practitioner during the COVID pandemic.  She saw a nurse practitioner last year.  She continues to be stable with very minimal symptom burden.  She says when she climbs 15 steps to her attic she will get a little bit winded and she will breathe a little bit harder but nothing out of the ordinary.  1 year ago she did get cardiac stents.  There is currently no chest pain.  Rates dyspnea is mild no cough no wheezing.  She had high-resolution CT chest in May 2023.  The level of NSIP pulmonary fibrosis very small mild and stable.  She has been reassured about this.  There are no new issues.   OV 03/06/2022  Subjective:  Patient ID: Alison Ayala, female , DOB: 06/26/1945 , age 55 y.o. , MRN: [id number] , ADDRESS: [address] [place] [address] PCP Laurann Montana, MD Patient Care Team: Laurann Montana, MD as PCP - General (Family Medicine) Nahser, Deloris Ping, MD as PCP - Cardiology (Cardiology)  This Provider for this visit: Treatment Team:  Attending Provider: Kalman Shan, MD    03/06/2022 -   Chief Complaint  Patient presents with   Follow-up    Pt is here for follow up for ILD. Pt had spiro and dlco this visit. Heart cath done 10/05/21. Pt states that her breathing is doing well with no issues, noted.       HPI Alison Ayala 77 y.o. -returns for routine follow-up for her stable NSIP that is mild.  Last seen in July 2023.  Since then she says she is overall stable.  She says sometimes recently she was started on an antidepressant and since then the stomach pain is improved.  Overall no urgent care visits or any surgeries.  Review of the records indicate that in August 2023 she ended up in the emergency department and had chest pain that was atypical.  She had a cardiac cath that showed circumflex lesion 20%.  Not obstructive coronary artery disease.  It was felt she had noncardiac chest pain.  She states this is improved with antidepressant.  Her current symptoms are minimal.  Her pulmonary function test today that I personally reviewed is stable.  Walking desaturation test also stable.  She prefers continued observation therapy. CT Chest data - HRCT 06/15/21  Narrative & Impression  CLINICAL DATA:  Interstitial lung disease   EXAM: CT CHEST WITHOUT CONTRAST   TECHNIQUE: Multidetector CT imaging of the chest was performed following the standard protocol without intravenous contrast. High resolution imaging of the lungs, as well as inspiratory and expiratory imaging, was performed.   RADIATION DOSE REDUCTION: This exam was performed according to the departmental dose-optimization program which includes automated exposure control, adjustment of the mA and/or kV according to patient size and/or use of iterative reconstruction technique.   COMPARISON:  CT chest dated Jul 04, 2020; chest CT dated September 02, 2018   FINDINGS: Cardiovascular: Normal heart size. No pericardial effusion. Normal caliber thoracic aorta with atherosclerotic disease. Coronary artery calcifications of the LAD and LAD stent. Status post CABG.   Mediastinum/Nodes: Moderate hiatal hernia. Thyroid is unremarkable. No pathologically enlarged lymph nodes seen in the chest.   Lungs/Pleura: Central airways are patent.  Mild basilar air trapping. Mild bilateral bronchiectasis, most pronounced in the lower lungs. Prior right lower lobe wedge resection. Left basilar atelectasis. No consolidation, pleural effusion or pneumothorax.   Upper  Abdomen: Stable low-attenuation nodule of the left adrenal gland, likely adenoma. No acute abnormality.   Musculoskeletal: Prior median sternotomy with intact sternal wires. No aggressive appearing osseous lesions. VP shunt coursing through the soft tissues of the right hemithorax.   IMPRESSION: 1. Patchy areas of ground-glass attenuation which seen on Jul 04, 2020 chest CT have resolved, and may have been due to pulmonary edema or acute exacerbation of ILD. On today's exam there is very minimal subpleural reticulation with no evidence of progression when compared with prior September 02, 2018 exam. Findings remain in keeping with very mild NSIP pattern fibrosis as demonstrated by wedge biopsy histology. 2. Aortic Atherosclerosis (ICD10-I70.0).     Electronically Signed   By: Allegra Lai M.D.   On: 06/16/2021 20:29       OV 12/11/2022  Subjective:  Patient ID: Alison Ayala, female , DOB: 1945/04/30 , age 50 y.o. , MRN: [id number] , ADDRESS: [address] [place] [address] PCP Laurann Montana, MD Patient Care Team: Laurann Montana, MD as PCP - General (Family Medicine) Nahser, Deloris Ping, MD as PCP - Cardiology (Cardiology)  This Provider for this visit: Treatment Team:  Attending Provider: Kalman Shan, MD   Follow-up mediastinal mass Anteriorand PET hot = Castleman's disease,   - based on PET scan, July 2019 - biopsy provine  Follow-up interstitial lung disease  -likely early/mind NSIP, July 2019 - on surgical lung biopsy - last CT May 2023  12/11/2022 -   Chief Complaint  Patient presents with   Follow-up    Review PFT. Breathing is unchanged since the last visit.      HPI Alison Ayala 77 y.o. -returns for follow-up.   She states since last visit in January 2024 she continues to remain stable.  There is no worsening.  She states the only interim issues that she stopped taking one of the medications given by Dr. Rhea Belton for nutcracker esophagus.  Otherwise she continues to do well if she feels the dyspnea stable.  Excess hypoxemia test is stable.  She had pulmonary function test and the some fluctuation which she believes because of sinus drainage on the day of testing and not being coached properly.  She does not see a reason why she should be declining from a ILD. Interim Health status: No new complaints No new medical problems. No new surgeries. No ER visits. No Urgent care visits.   Second treatment of her vaccination status.  Is being several years since she had her pneumococcal vaccine.  I discussed this with her.  Advised her to take pneumococcal Prevnar and she is willing to do that but we are out of stock.   SYMPTOM SCALE - ILD 03/06/2022 12/11/2022   Current weight    O2 use ra ra  Shortness of Breath 0 -> 5 scale with 5 being worst (score 6 If unable to do)   At rest 0 0  Simple tasks - showers, clothes change, eating, shaving 0 1  Household (dishes, doing bed, laundry) 1 1  Shopping 1 1  Walking level at own pace 1 1  Walking up Stairs 1 2  Total (30-36) Dyspnea Score 4 6  How bad is your cough? 1 0  How bad is your fatigue 1 0  How bad is nausea 2 0  How bad is vomiting?  0 0  How bad is diarrhea? 0 0  How bad is anxiety? 0 0  How bad is depression 1 1  Any  chronic pain - if so where and how bad x        Simple office walk 185 feet x  3 laps goal with forehead probe 07/03/2017  08/23/2017  11/26/2017  02/26/2018  08/25/2021  03/06/2022  12/11/2022   O2 used Room air Room air Room air rooom air ra ra ra  Number laps completed 3 3 3 3 3 3  Sit stand x 15  Comments about pace  normal  Good pace     Resting Pulse Ox/HR 96% and 64/min 97% and HR 87/min  96% and 73/min 100% and 66/min 96% and  HR 103 97%  99% and HR 67  Final Pulse Ox/HR 92% and 88/min 92% and HR 100/min  96% and 91/min 95% and 95/min 95% and HR 126 94% 96% and HR 81  Desaturated </= 88% no0 no  no no non  no  Desaturated <= 3% points yes yes  no no no  YES  Got Tachycardic >/= 90/min no yes  yes yes no  no  Symptoms at end of test Dyspnea reproduced Mild fatigue only  No complaints Mild dyspnea  No dyspnea  Miscellaneous comments Normal pace slow stable  improved           PFT     Latest Ref Rng & Units 11/24/2022    9:53 AM 03/06/2022    9:53 AM 07/22/2020    2:36 PM 10/08/2018    1:52 PM 02/26/2018    1:48 PM 07/11/2017    9:49 AM  ILD indicators  FVC-Pre L 2.56  2.63  2.59  2.74  2.55  2.14   FVC-Predicted Pre % 97  100  92  93  88  73   FVC-Post L   2.59      FVC-Predicted Post %   92      TLC L   4.55      TLC Predicted %   90      DLCO uncorrected ml/min/mmHg 14.07  16.74  14.56  17.78  18.35  19.55   DLCO UNC %Pred % 76  91  76  90  75  80   DLCO Corrected ml/min/mmHg 14.07  16.74  15.94      DLCO COR %Pred % 76  91  83          LAB RESULTS last 96 hours No results found.       has a past medical history of CAD (coronary artery disease), Carpal tunnel syndrome of right wrist, Castleman disease (HCC), Diverticulosis, GERD (gastroesophageal reflux disease), H/O hiatal hernia, H/O intraventricular hemorrhage (2004--  SPONTANEOUS), Heart murmur, Hiatal hernia, Hip pain, right (OCCASIONAL -- PINCHED NERVE), History of gastric ulcer (2001), Hyperlipidemia, Hypertension, Hypoglycemia, Internal hemorrhoids, Interstitial lung disease (HCC), Jackhammer esophagus, Mediastinal mass, Mitral valve prolapse, Normal echocardiogram (30 YRS AGO), NSIP (nonspecific interstitial pneumonia) (HCC), S/P ventriculoperitoneal shunt (2004--- PER PT SHUNT IS WORKING WELL --  NO ISSUES), SAH (subarachnoid hemorrhage) (HCC), Squamous cell cancer of scalp and skin of neck, and Tubular adenoma of colon.   reports that she  has never smoked. She has never used smokeless tobacco.  Past Surgical History:  Procedure Laterality Date   BILATERAL FRONTAL INTERVENTRICULAR CATHETER VIA TWIST DRILL HOLE  AUG 2004   SPONTANEOUS SUBARACHNOID  HEMORRHAGE AND  INTRAVENTRICULAR HEMORRHAGE   BIOPSY  06/16/2021   Procedure: BIOPSY;  Surgeon: Beverley Fiedler, MD;  Location: [facility];  Service: Gastroenterology;;   CARPAL TUNNEL RELEASE  07/26/2011  Procedure: CARPAL TUNNEL RELEASE;  Surgeon: Drucilla Schmidt, MD;  Location: [facility];  Service: Orthopedics;  Laterality: Right;   CATARACT EXTRACTION W/ INTRAOCULAR LENS  IMPLANT, BILATERAL  2012   BILATERAL   COLONOSCOPY WITH PROPOFOL N/A 06/16/2021   Procedure: COLONOSCOPY WITH PROPOFOL;  Surgeon: Beverley Fiedler, MD;  Location: [facility];  Service: Gastroenterology;  Laterality: N/A;   CORONARY IMAGING/OCT N/A 04/08/2020   Procedure: INTRAVASCULAR IMAGING/OCT;  Surgeon: Kathleene Hazel, MD;  Location: [facility];  Service: Cardiovascular;  Laterality: N/A;   CORONARY STENT INTERVENTION N/A 04/08/2020   Procedure: CORONARY STENT INTERVENTION;  Surgeon: Kathleene Hazel, MD;  Location: [facility];  Service: Cardiovascular;  Laterality: N/A;   DUPUYTREN CONTRACTURE RELEASE  07/26/2011   Procedure: DUPUYTREN CONTRACTURE RELEASE;  Surgeon: Drucilla Schmidt, MD;  Location: [facility];  Service: Orthopedics;  Laterality: Right;  EXCISION OF DUPYTRENS BAND THUMB- INDEX WEB SPACE   ESOPHAGEAL MANOMETRY N/A 01/27/2019   Procedure: ESOPHAGEAL MANOMETRY (EM);  Surgeon: Beverley Fiedler, MD;  Location: [facility];  Service: Gastroenterology;  Laterality: N/A;   ESOPHAGOGASTRODUODENOSCOPY N/A 06/16/2021   Procedure: ESOPHAGOGASTRODUODENOSCOPY (EGD);  Surgeon: Beverley Fiedler, MD;  Location: [facility];  Service: Gastroenterology;  Laterality: N/A;   EYE SURGERY Bilateral    scrapped corneas   LEFT HEART CATH AND CORONARY  ANGIOGRAPHY N/A 04/08/2020   Procedure: LEFT HEART CATH AND CORONARY ANGIOGRAPHY;  Surgeon: Kathleene Hazel, MD;  [facility];  Service: Cardiovascular;  Laterality: N/A;   LEFT HEART CATH AND CORONARY ANGIOGRAPHY N/A 10/05/2021   Procedure: LEFT HEART CATH AND CORONARY ANGIOGRAPHY;  Surgeon: Iran Ouch, MD;  Location: [facility];  Service: Cardiovascular;  Laterality: N/A;   LUNG BIOPSY Right 08/07/2017   Procedure: Open LUNG BIOPSY;  Surgeon: Kerin Perna, MD;  Location[facility];  Service: Thoracic;  Laterality: Right;   RESECTION OF MEDIASTINAL MASS N/A 08/07/2017   Procedure: RESECTION OF MEDIASTINAL MASS;  Surgeon: Kerin Perna, MD;  Location: [facility] OR;  Service: Thoracic;  Laterality: N/A;   RETINAL DETACHMENT REPAIR W/ SCLERAL BUCKLE LE  03-10-2011   LEFT EYE   STERNOTOMY N/A 08/07/2017   Procedure: STERNOTOMY;  Surgeon: Kerin Perna, MD;  Location: [facility] OR;  Service: Thoracic;  Laterality: N/A;   VENTRICULOPERITONEAL SHUNT  10-08-2002   RIGHT FRONTAL FOR COMMUNICATING HYDROCEPHALUS    Allergies  Allergen Reactions   Brilinta [Ticagrelor] Shortness Of Breath   Coreg [Carvedilol] Palpitations and Other (See Comments)    Abdominal bloating   Claritin [Loratadine] Other (See Comments)    Blurred vision Dry eyes   Lipitor [Atorvastatin] Other (See Comments)    Fatigue    Lisinopril Other (See Comments)    "drunk feeling"    Immunization History  Administered Date(s) Administered   Fluad Quad(high Dose 65+) 10/08/2018   Influenza Split 10/16/2018   Influenza, High Dose Seasonal PF 10/07/2016, 11/06/2017, 10/15/2020   Influenza-Unspecified 01/13/2011, 12/11/2011, 10/03/2012, 10/07/2013, 10/13/2014, 10/14/2015, 10/24/2016, 11/06/2017, 01/05/2020, 10/08/2022   PFIZER(Purple Top)SARS-COV-2 Vaccination 02/26/2019, 03/17/2019, 12/19/2019   Pneumococcal Conjugate-13 04/04/2013   Pneumococcal Polysaccharide-23 07/18/2010   Td 10/14/2015   Tdap  05/31/2005   Zoster Recombinant(Shingrix) 11/06/2017, 12/25/2018   Zoster, Live 01/21/2008, 11/06/2017, 12/25/2018    Family History  Problem Relation Age of Onset   Colon cancer Mother 49   Colon polyps Mother    Stomach cancer Maternal Aunt    Breast cancer  Maternal Aunt    Uterine cancer Maternal Aunt    Rectal cancer Neg Hx    Esophageal cancer Neg Hx      Current Outpatient Medications:    acetaminophen (TYLENOL) 500 MG tablet, Take 1-2 tablets (500-1,000 mg total) by mouth 2 (two) times daily as needed for moderate pain or headache., Disp: 30 tablet, Rfl: 0   buPROPion (WELLBUTRIN XL) 300 MG 24 hr tablet, Take 300 mg by mouth every morning., Disp: , Rfl:    carboxymethylcellulose (REFRESH PLUS) 0.5 % SOLN, Place 1 drop into both eyes in the morning., Disp: , Rfl:    Cholecalciferol (D3 2000 PO), Take 2,000 mcg by mouth daily., Disp: , Rfl:    clopidogrel (PLAVIX) 75 MG tablet, TAKE 1 TABLET BY MOUTH EVERY DAY, Disp: 90 tablet, Rfl: 3   furosemide (LASIX) 20 MG tablet, Take 1 tablet (20 mg) on Monday, Wednesday, Friday. Take additional 1 tablet (20mg ) as needed on those days., Disp: 30 tablet, Rfl: 1   metoprolol succinate (TOPROL-XL) 100 MG 24 hr tablet, Take 1 tablet (100 mg total) by mouth daily. Take with or immediately following a meal., Disp: 90 tablet, Rfl: 3   nitroGLYCERIN (NITROSTAT) 0.4 MG SL tablet, DISSOLVE 1 TABLET UNDER THE TONGUE ONCE FOR EPIGASTRIC PAIN/CHEST PAIN NOT RELIEVED BY PEPPERMINT ALTOIDS. IF THIS DOES NOT RESOLVE PAIN, CALL DR!, Disp: 75 tablet, Rfl: 2   ondansetron (ZOFRAN-ODT) 4 MG disintegrating tablet, TAKE 1 TABLET BY MOUTH EVERY 8 HOURS AS NEEDED FOR NAUSEA AND VOMITING, Disp: 30 tablet, Rfl: 0   rosuvastatin (CRESTOR) 20 MG tablet, TAKE 1 TABLET BY MOUTH EVERY DAY, Disp: 90 tablet, Rfl: 3   spironolactone (ALDACTONE) 25 MG tablet, Take 1 tablet (25 mg total) by mouth daily., Disp: 90 tablet, Rfl: 3   valsartan (DIOVAN) 320 MG tablet, Take 1 tablet  (320 mg total) by mouth daily., Disp: 90 tablet, Rfl: 3      Objective:   Vitals:   12/11/22 1539  BP: 134/66  Pulse: 67  SpO2: 99%  Weight: 213 lb (96.6 kg)  Height: 5' 3.5" (1.613 m)    Estimated body mass index is 37.14 kg/m as calculated from the following:   Height as of this encounter: 5' 3.5" (1.613 m).   Weight as of this encounter: 213 lb (96.6 kg).  @WEIGHTCHANGE @  Filed Weights   12/11/22 1539  Weight: 213 lb (96.6 kg)     Physical Exam   General: No distress. obese O2 at rest: no Cane present: no Sitting in wheel chair: no Frail: no Obese: no Neuro: Alert and Oriented x 3. GCS 15. Speech normal Psych: Pleasant Resp:  Barrel Chest - no.  Wheeze - no, Crackles - no, No overt respiratory distress CVS: Normal heart sounds. Murmurs - no Ext: Stigmata of Connective Tissue Disease - no HEENT: Normal upper airway. PEERL +. No post nasal drip        Assessment:       ICD-10-CM   1. NSIP (nonspecific interstitial pneumonia) (HCC)  J84.89     2. ILD (interstitial lung disease) (HCC)  J84.9     3. Vaccine counseling  Z71.85     4. Pneumococcal vaccination indicated  Z91.89          Plan:     Patient Instructions     ICD-10-CM   1. NSIP (nonspecific interstitial pneumonia) (HCC)  J84.89     2. ILD (interstitial lung disease) (HCC)  J84.9     3.  Vaccine counseling  Z71.85     4. Pneumococcal vaccination indicated  Z91.89         Stable disease based on symptoms, exam and exercise test PFT is givingin mildly confusing data but could be because of sinus issues that day of testing and coaching - . Lst CT May 2023 with stability since 2020  Plan - continue to monitor off prednisone  - 6- months do  HRCT - 6 months do spiro and dlco - if lungs ever get worse, a drug called Ofev +/- clinical trials is an option - lose weight - talk to PCP Laurann Montana, MD if medicines are an options  - Prevnar-21 vaccine 12/11/2022 - but we are out of  stock  - get on your own  - then not needed   Followup 6 months but after  HRCT and PFT - ; symptoms score and walk test at followup   FOLLOWUP Return in about 6 months (around 06/10/2023) for 15 min visit, after HRCT chest, after Cleda Daub and DLCO, ILD, with Dr Marchelle Gearing, Face to Face Visit.    SIGNATURE    Dr. Kalman Shan, M.D., F.C.C.P,  Pulmonary and Critical Care Medicine Staff Physician, [facility] Center Director - Interstitial Lung Disease  Program  Pulmonary Fibrosis [facility] at [facility] [place], [place], [address]  Pager: [phone], If no answer or between  15:00h - 7:00h: call [phone] Telephone: [phone]  4:02 PM 12/11/2022   Moderate Complexity MDM OFFICE  2021 E/M guidelines, first released in 2021, with minor revisions added in 2023 and 2024 Must meet the requirements for 2 out of 3 dimensions to qualify.    Number and complexity of problems addressed Amount and/or complexity of data reviewed Risk of complications and/or morbidity  One or more chronic illness with mild exacerbation, OR progression, OR  side effects of treatment  Two or more stable chronic illnesses - ILD ad vaccine counseling  One undiagnosed new problem with uncertain prognosis  One acute illness with systemic symptoms   One Acute complicated injury Must meet the requirements for 1 of 3 of the categories)  Category 1: Tests and documents, historian  Any combination of 3 of the following:  Assessment requiring an independent historian  Review of prior external note(s) from each unique source  Review of results of each unique test  pft  Ordering of each unique test - PFT and CT    Category 2: Interpretation of tests   Independent interpretation of a test performed by another physician/other qualified health care professional (not separately reported)  Category 3: Discuss management/tests  Discussion of management or  test interpretation with external physician/other qualified health care professional/appropriate source (not separately reported) Moderate risk of morbidity from additional diagnostic testing or treatment Examples only:  Prescription drug management  Decision regarding minor surgery with identfied patient or procedure risk factors  Decision regarding elective major surgery without identified patient or procedure risk factors  Diagnosis or treatment significantly limited by social determinants of health

## 2022-12-11 NOTE — Addendum Note (Signed)
Addended by: Christen Butter on: 12/11/2022 04:15 PM   Modules accepted: Orders

## 2022-12-11 NOTE — Patient Instructions (Addendum)
ICD-10-CM   1. NSIP (nonspecific interstitial pneumonia) (HCC)  J84.89     2. ILD (interstitial lung disease) (HCC)  J84.9     3. Vaccine counseling  Z71.85     4. Pneumococcal vaccination indicated  Z91.89         Stable disease based on symptoms, exam and exercise test PFT is givingin mildly confusing data but could be because of sinus issues that day of testing and coaching - . Lst CT May 2023 with stability since 2020  Plan - continue to monitor off prednisone  - 6- months do  HRCT - 6 months do spiro and dlco - if lungs ever get worse, a drug called Ofev +/- clinical trials is an option - lose weight - talk to PCP Laurann Montana, MD if medicines are an options  - Prevnar-21 vaccine 12/11/2022 - but we are out of stock  - get on your own  - then not needed   Followup 6 months but after  HRCT and PFT - ; symptoms score and walk test at followup

## 2022-12-22 ENCOUNTER — Other Ambulatory Visit: Payer: Self-pay | Admitting: Internal Medicine

## 2023-01-17 ENCOUNTER — Other Ambulatory Visit: Payer: Self-pay

## 2023-01-17 MED ORDER — FUROSEMIDE 20 MG PO TABS: ORAL_TABLET | ORAL | 0 refills | Status: DC

## 2023-01-25 ENCOUNTER — Ambulatory Visit: Payer: Medicare HMO | Attending: Internal Medicine | Admitting: Internal Medicine

## 2023-01-25 VITALS — BP 126/58 | HR 63 | Ht 63.0 in | Wt 213.4 lb

## 2023-01-25 DIAGNOSIS — K224 Dyskinesia of esophagus: Secondary | ICD-10-CM

## 2023-01-25 DIAGNOSIS — E785 Hyperlipidemia, unspecified: Secondary | ICD-10-CM

## 2023-01-25 DIAGNOSIS — I10 Essential (primary) hypertension: Secondary | ICD-10-CM | POA: Diagnosis not present

## 2023-01-25 DIAGNOSIS — I2510 Atherosclerotic heart disease of native coronary artery without angina pectoris: Secondary | ICD-10-CM | POA: Diagnosis not present

## 2023-01-25 DIAGNOSIS — I351 Nonrheumatic aortic (valve) insufficiency: Secondary | ICD-10-CM

## 2023-01-25 NOTE — Progress Notes (Signed)
Cardiology Office Note:   Date:  01/25/2023  ID:  Alison Ayala, DOB March 23, 1945, MRN [id number]  History of Present Illness:   Alison Ayala is a 77 y.o. female with history of CAD s/p DES to LAD in 04/2020, HTN, HLD, and HFpEF who presents to clinic for follow-up.  Discussed the use of AI scribe software for clinical note transcription with the patient, who gave verbal consent to proceed.  History of Present Illness   The patient, with a history of stent placement in 2022, presents with persistent chest pain. The pain has been ongoing for about a year, leading to a hospital admission where a catheterization was performed. The results of the catheterization were normal, leading to the conclusion that the chest pain was not heart-related. The patient was diagnosed with nutcracker esophagus per her report, a GI condition that causes severe chest pain. The patient has been struggling with distinguishing the chest pain caused by the esophagus from potential heart-related pain. Despite having nitroglycerin to manage the pain, the patient reports minimal relief. The patient also has a history of high cholesterol, which is being managed with rosuvastatin (Crestor) and has aortic valve regurgitation.      Per review of the record, patient had coronary CTA 03/2020 that showed severe stenosis in ostial LAD. She underwent LHC on 04/08/2020 which revealed severe ostial/proximal LAD stenosis treated successfully with PTCA/DES x1, no obstructive disease in large dominant circumflex artery, small nondominant RCA, normal LV function, no mitral regurgitation, LVEDP normal.   Was admitted in 09/2021 with chest pain. Repeat cath 10/05/2021 which revealed 20% ostial left circumflex lesion with widely patent LAD stent with no significant in-stent restenosis. No culprit lesion for symptoms. Mildly elevated LVEDP. It was felt she had noncardiac chest pain with recommendations to continue medical therapy.   Was last  seen by Eligha Bridegroom, NP where her chest pain had improved with Wellbutrin.  Today, the patient states that she feels okay. Continues to feel tired and worn out. Has occasional brief episodes of nonexertional chest pain that lasts a few minutes and then resolves without issue. Has mild dyspnea on exertion that is not progressing. Has occasional edema but well controlled with the lasix. No orthopnea, PND or palpitations.  Past Medical History:  Diagnosis Date   CAD (coronary artery disease)    a. s/p DES to LAD 04/2020.   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HCC)    Diverticulosis    GERD (gastroesophageal reflux disease)    H/O hiatal hernia    H/O intraventricular hemorrhage 2004--  SPONTANEOUS   S/P SHUNT   Heart murmur    Hiatal hernia    Hip pain, right OCCASIONAL -- PINCHED NERVE   History of gastric ulcer 2001   Hyperlipidemia    Hypertension    Hypoglycemia    Internal hemorrhoids    Interstitial lung disease (HCC)    Jackhammer esophagus    Mediastinal mass    Mitral valve prolapse    Normal echocardiogram 30 YRS AGO   NSIP (nonspecific interstitial pneumonia) (HCC)    S/P ventriculoperitoneal shunt 2004--- PER PT SHUNT IS WORKING WELL --  NO ISSUES   SAH (subarachnoid hemorrhage) (HCC)    Squamous cell cancer of scalp and skin of neck    mole back of head   Tubular adenoma of colon      ROS: As per HPI  Studies Reviewed:    EKG:  EKG Interpretation Date/Time:  Thursday January 25 2023 08:41:25 EST Ventricular Rate:  63 PR Interval:    QRS Duration:  90 QT Interval:  398 QTC Calculation: 407 R Axis:   7  Text Interpretation: Sinus rhythm Cannot rule out Anterior infarct , age undetermined Confirmed by Weston Brass ([id number]) on 01/25/2023 9:27:49 AM    Cardiac Studies & Procedures   CARDIAC CATHETERIZATION  CARDIAC CATHETERIZATION 10/05/2021  Narrative   Ost Cx lesion is 20% stenosed.   Non-stenotic Ost LAD to Prox LAD  lesion was previously treated.  1.  Widely patent ostial LAD stent with no significant restenosis.  The stent extends 1 to 2 mm into the left main with partial jailing of the left circumflex.  However, there is only mild ostial left circumflex stenosis.  The coronary arteries are otherwise normal. 2.  Left ventricular angiography was not performed.  LVEDP was mildly elevated.  Recommendations: Suspect noncardiac chest pain. Continue medical therapy.  Findings Coronary Findings Diagnostic  Dominance: Left  Left Anterior Descending Vessel is large. Non-stenotic Ost LAD to Prox LAD lesion was previously treated.  Left Circumflex Vessel is large. Ost Cx lesion is 20% stenosed.  Right Coronary Artery Vessel is small.  Intervention  No interventions have been documented.   CARDIAC CATHETERIZATION  CARDIAC CATHETERIZATION 04/08/2020  Narrative  Ost LAD to Prox LAD lesion is 99% stenosed.  A drug-eluting stent was successfully placed using a SYNERGY XD 4.0X16.  Post intervention, there is a 0% residual stenosis.  The left ventricular systolic function is normal.  LV end diastolic pressure is normal.  The left ventricular ejection fraction is 55-65% by visual estimate.  There is no mitral valve regurgitation.  1. Severe ostial/proximal LAD stenosis 2. Successful PTCA/DES x 1 ostial/proximal LAD with optimization with OCT imaging 3. No obstructive disease in the large dominant Circumflex artery. 4. Small non-dominant RCA 5. Normal LV systolic function  Recommendations: Continue DAPT with ASA and Brilinta for at least six months but preferably one year if she tolerates it well. If necessary, the Brilinta could be changed to Plavix in 3 months. Same day post PCI discharge.  Findings Coronary Findings Diagnostic  Dominance: Left  Left Anterior Descending Vessel is large. Ost LAD to Prox LAD lesion is 99% stenosed.  Left Circumflex Vessel is large.  Right Coronary  Artery Vessel is small.  Intervention  Ost LAD to Prox LAD lesion Stent [place] GUIDE 6FR XBLAD3.5 guide catheter was inserted. Lesion crossed with guidewire using a WIRE COUGAR XT STRL 190CM. Pre-stent angioplasty was performed using a BALLOON SAPPHIRE 2.5X15. A drug-eluting stent was successfully placed using a SYNERGY XD 4.0X16. Stent strut is well apposed. Post-stent angioplasty was performed using a BALLOON SAPPHIRE [place] 4.0X8. Post-Intervention Lesion Assessment The intervention was successful. Pre-interventional TIMI flow is 3. Post-intervention TIMI flow is 3. No complications occurred at this lesion. There is a 0% residual stenosis post intervention.   STRESS TESTS  EXERCISE TOLERANCE TEST (ETT) 07/25/2017  Narrative  Blood pressure demonstrated a hypertensive response to exercise.  There was no ST segment deviation noted during stress.  No T wave inversion was noted during stress.  Blood pressure demonstrated a hypertensive response to exercise  Overall, the patient's exercise capacity was moderately impaired.  The patient's response to exercise was inadequate for diagnosis  Arrhythmias during stress: occasional PVCs  Duke Treadmill Score: intermediate risk  Indeterminate stress test - patient did not reach THR. Poor exercise capacity limited by dyspnea. No diagnostic ischemic EKG changes at given  workload. PVC's noted with exercise and in recovery. Consider further ischemic evaluation with pharmacologic perfusion, CT coronary angiography or coronary angiography if necessary.  ECHOCARDIOGRAM  ECHOCARDIOGRAM COMPLETE 08/18/2022  Narrative ECHOCARDIOGRAM REPORT    Patient Name:   MIKHAILA FANK Bogacki Date of Exam: 08/18/2022 Medical Rec #:  [id number]         Height:       63.5 in Accession #:    [id number]        Weight:       215.6 lb Date of Birth:  1945-04-06         BSA:          2.008 m Patient Age:    77 years          BP:           130/60 mmHg Patient  Gender: F                 HR:           66 bpm. Exam Location:  [address]  Procedure: 2D Echo, Cardiac Doppler, Color Doppler and 3D Echo  Indications:    Moderate Aortic Regurgitation I35.1  History:        Patient has prior history of Echocardiogram examinations, most recent 07/03/2020. CAD, Mitral Valve Prolapse; Risk Factors:Hypertension and Dyslipidemia.  Sonographer:    Thurman Coyer RDCS Referring Phys: [id number] HEATHER E P[place]ON   Sonographer Comments: Global longitudinal strain was attempted. IMPRESSIONS   1. Left ventricular ejection fraction, by estimation, is 60 to 65%. The left ventricle has normal function. The left ventricle has no regional wall motion abnormalities. Left ventricular diastolic parameters are consistent with Grade I diastolic dysfunction (impaired relaxation). 2. Right ventricular systolic function is normal. The right ventricular size is normal. There is normal pulmonary artery systolic pressure. 3. Left atrial size was mild to moderately dilated. 4. Right atrial size was mild to moderately dilated. 5. The mitral valve is normal in structure. Moderate mitral valve regurgitation. No evidence of mitral stenosis. 6. Tricuspid valve regurgitation is moderate. 7. The aortic valve is tricuspid. There is mild calcification of the aortic valve. Aortic valve regurgitation is moderate. Aortic valve sclerosis/calcification is present, without any evidence of aortic stenosis. Aortic regurgitation PHT measures 381 msec. 8. The inferior vena cava is normal in size with greater than 50% respiratory variability, suggesting right atrial pressure of 3 mmHg.  FINDINGS Left Ventricle: Left ventricular ejection fraction, by estimation, is 60 to 65%. The left ventricle has normal function. The left ventricle has no regional wall motion abnormalities. The left ventricular internal cavity size was normal in size. There is no left ventricular hypertrophy. Left  ventricular diastolic parameters are consistent with Grade I diastolic dysfunction (impaired relaxation).  Right Ventricle: The right ventricular size is normal. No increase in right ventricular wall thickness. Right ventricular systolic function is normal. There is normal pulmonary artery systolic pressure. The tricuspid regurgitant velocity is 2.73 m/s, and with an assumed right atrial pressure of 3 mmHg, the estimated right ventricular systolic pressure is 32.8 mmHg.  Left Atrium: Left atrial size was mild to moderately dilated.  Right Atrium: Right atrial size was mild to moderately dilated.  Pericardium: There is no evidence of pericardial effusion.  Mitral Valve: The mitral valve is normal in structure. Moderate mitral valve regurgitation. No evidence of mitral valve stenosis.  Tricuspid Valve: The tricuspid valve is normal in structure. Tricuspid valve regurgitation is moderate . No evidence of tricuspid stenosis.  Aortic Valve: The aortic valve is tricuspid. There is mild calcification of the aortic valve. Aortic valve regurgitation is moderate. Aortic regurgitation PHT measures 381 msec. Aortic valve sclerosis/calcification is present, without any evidence of aortic stenosis.  Pulmonic Valve: The pulmonic valve was normal in structure. Pulmonic valve regurgitation is trivial. No evidence of pulmonic stenosis.  Aorta: The aortic root is normal in size and structure.  Venous: The inferior vena cava is normal in size with greater than 50% respiratory variability, suggesting right atrial pressure of 3 mmHg.  IAS/Shunts: No atrial level shunt detected by color flow Doppler.   LEFT VENTRICLE PLAX 2D LVIDd:         4.50 cm   Diastology LVIDs:         2.70 cm   LV e' medial:    7.40 cm/s LV PW:         0.90 cm   LV E/e' medial:  12.0 LV IVS:        1.10 cm   LV e' lateral:   9.14 cm/s LVOT diam:     2.10 cm   LV E/e' lateral: 9.7 LV SV:         118 LV SV Index:   59 LVOT Area:      3.46 cm  3D Volume EF: 3D EF:        58 % LV EDV:       86 ml LV ESV:       36 ml LV SV:        50 ml  RIGHT VENTRICLE RV Basal diam:  3.70 cm RV Mid diam:    2.80 cm RV S prime:     13.50 cm/s TAPSE (M-mode): 2.4 cm  LEFT ATRIUM             Index        RIGHT ATRIUM           Index LA diam:        5.20 cm 2.59 cm/m   RA Area:     16.50 cm LA Vol (A2C):   65.4 ml 32.57 ml/m  RA Volume:   50.00 ml  24.90 ml/m LA Vol (A4C):   70.7 ml 35.21 ml/m LA Biplane Vol: 71.7 ml 35.71 ml/m AORTIC VALVE LVOT Vmax:   152.00 cm/s LVOT Vmean:  99.800 cm/s LVOT VTI:    0.342 m AI PHT:      381 msec  AORTA Ao Root diam: 3.10 cm Ao Asc diam:  3.00 cm  MITRAL VALVE                  TRICUSPID VALVE MV Area (PHT): 3.48 cm       TR Peak grad:   29.8 mmHg MV Decel Time: 218 msec       TR Vmax:        273.00 cm/s MR Peak grad:    105.7 mmHg MR Mean grad:    65.0 mmHg    SHUNTS MR Vmax:         514.00 cm/s  Systemic VTI:  0.34 m MR Vmean:        379.0 cm/s   Systemic Diam: 2.10 cm MR PISA:         2.26 cm MR PISA Eff ROA: 14 mm MR PISA Radius:  0.60 cm MV E velocity: 88.60 cm/s MV A velocity: 97.50 cm/s MV E/A ratio:  0.91  Arvilla Meres MD Electronically signed by Arvilla Meres MD  Signature Date/Time: 08/18/2022/11:00:26 AM    Final    CT SCANS  CT CORONARY FRACTIONAL FLOW RESERVE DATA PREP 03/31/2020  Narrative EXAM: FFRCT ANALYSIS  FINDINGS: FFRct analysis was performed on the original cardiac CT angiogram dataset. Diagrammatic representation of the FFRct analysis is provided in a separate PDF document in PACS. This dictation was created using the PDF document and an interactive 3D model of the results. 3D model is not available in the EMR/PACS. Normal FFR range is >0.80.  1. Left Main: FFRct 0.99.  2. LAD: Ostial LAD FFRct 0.96.  Proximal FFRct 0.73.  Mid FFRct 0.67  3. LCX: FFRct 0.98 proximal, 0.96 mid, 0.94 distal. OM1 FFRct 0.94. OM2 FFRct 0.88.  L-PDA FFRct 0.57.  4. RCA: FFRct 0.93.  IMPRESSION: 1. FFRct findings are consistent with severe stenosis in the ostial LAD.  2. FFRct findings are consistent with severe stenosis in the L-PDA. On comparison with the coronary CT-A, this region appears small and tortuous, but not severely stenotic.  3.  Recommend cardiac catheterization.  Tiffany C. Duke Salvia, MD   Electronically Signed By: Chilton Si On: 04/01/2020 15:35   CT SCANS  CT CORONARY MORPH W/CTA COR W/SCORE 03/30/2020  Addendum 03/31/2020 12:35 PM ADDENDUM REPORT: 03/31/2020 12:33  CLINICAL DATA:  73F with hypertension, hyperlipidemia, mitral valve prolapse and a mediastinal mass for preoperative risk assessment.  EXAM: Cardiac/Coronary  CT  TECHNIQUE: The patient was scanned on a [employer].  FINDINGS: A 120 kV prospective scan was triggered in the descending thoracic aorta at 111 HU's. Axial non-contrast 3 mm slices were carried out through the heart. The data set was analyzed on a dedicated work station and scored using the Agatson method. Gantry rotation speed was 250 msecs and collimation was .6 mm. No beta blockade and 0.8 mg of sl NTG was given. The 3D data set was reconstructed in 5% intervals of the 67-82 % of the R-R cycle. Diastolic phases were analyzed on a dedicated work station using MPR, MIP and VRT modes. The patient received 80 cc of contrast.  Aorta: Normal size. Calcification of the aortic root and descending aorta. No dissection.  Aortic Valve:  Trileaflet.  No calcifications.  Coronary Arteries:  Normal coronary origin.  Right dominance.  RCA is a small, non-dominant artery.  There is no plaque.  Left main is a large artery that gives rise to LAD, RI, and LCX arteries. Minimal (<25%) calcified plaque.  LAD is a large vessel that has severe (>70%) soft plaque at the ostium. There is a tiny D1 that is diffusely diseased. D2 and D3 are small vessels without  significant disease.  LCX is a dominant artery that gives rise to one large OM1 branch and L-PDA. OM1 tapers abruptly and may be occluded but is difficult to evaluate. There is no significant disease in the LCX or L-PDA.  Other findings:  Normal pulmonary vein drainage into the left atrium.  Normal let atrial appendage without a thrombus.  Normal size of the pulmonary artery.  Mild mitral annular calcification.  IMPRESSION: 1. Coronary calcium score of 0.687. This was 26th percentile for age and sex matched control.  2. Normal coronary origin with left dominance.  3. There is severe (>70%) soft plaque in the ostial LAD. CAD-RADS 4.  4.  Will send for FFRct.  Chilton Si, MD   Electronically Signed By: Chilton Si On: 03/31/2020 12:33  Narrative EXAM: OVER-READ INTERPRETATION  CT CHEST  The following report is an over-read  performed by radiologist Dr. Trudie Reed of [facility] on 03/30/2020. This over-read does not include interpretation of cardiac or coronary anatomy or pathology. The coronary calcium score/coronary CTA interpretation by the cardiologist is attached.  COMPARISON:  None.  FINDINGS: Aortic atherosclerosis. Moderate hiatal hernia. Dependent areas of subsegmental atelectasis in the right lower lobe. Within the visualized portions of the thorax there are no suspicious appearing pulmonary nodules or masses, there is no acute consolidative airspace disease, no pleural effusions, no pneumothorax and no lymphadenopathy. Visualized portions of the upper abdomen are unremarkable. There are no aggressive appearing lytic or blastic lesions noted in the visualized portions of the skeleton. Median sternotomy wires. VP shunt catheter in the subcutaneous fat of the right anterior chest wall incidentally noted.  IMPRESSION: 1.  Aortic Atherosclerosis (ICD10-I70.0). 2. Moderate-sized hiatal hernia.  Electronically Signed: By:  Trudie Reed M.D. On: 03/30/2020 13:25           Risk Assessment/Calculations:              Physical Exam:   VS:  BP (!) 126/58 (BP Location: Left Arm, Patient Position: Sitting, Cuff Size: Normal)   Pulse 63   Ht 5\' 3"  (1.6 m)   Wt 213 lb 6.4 oz (96.8 kg)   SpO2 98%   BMI 37.80 kg/m    Wt Readings from Last 3 Encounters:  01/25/23 213 lb 6.4 oz (96.8 kg)  12/11/22 213 lb (96.6 kg)  07/20/22 215 lb 9.6 oz (97.8 kg)    Physical Exam   CHEST: Lungs clear to auscultation. CARDIOVASCULAR: 1/6 diastolic murmur noted upon auscultation.   GEN: No acute distress.   Neck: No JVD Cardiac: RRR,  Respiratory: Clear to auscultation bilaterally. GI: Soft, nontender, non-distended  MS: No edema; No deformity. Neuro:  Nonfocal  Psych: Normal affect     ASSESSMENT AND PLAN:    Assessment & Plan Essential hypertension  Nonrheumatic aortic valve insufficiency  Coronary artery disease involving native coronary artery of native heart without angina pectoris  Hyperlipidemia LDL goal <70  Esophageal spasm  Jackhammer esophagus   Assessment and Plan    Coronary Artery Disease Stable since stent placement in 2022. No recent cardiac chest pain. Last catheterization showed patent stent. -Patient with ostial-prox LAD stenosis s/p PCI in 04/2020 -Most recent cath 09/2021 with patent stent and no obstructive disease elsewhere -Continue metop 100mg  XL daily -Continue plavix 75mg  daily -Continue crestor 20mg  daily -Continue valsartan 320mg  daily  Nutcracker/Jackhammer Esophagus Persistent chest pain, likely esophageal in origin. Previous trial of Diltiazem and nitro without significant relief. -Patient to follow up with GI specialist, Dr. Rhea Belton.  Aortic Valve Regurgitation Mild, stable. -Continue monitoring with annual echocardiogram. Next scheduled for summer 2025.  Hyperlipidemia Well controlled on Rosuvastatin 20mg  daily. -Next cholesterol check with primary care  physician, Dr. Cliffton Asters, in January 2025. -Consider adding Lipoprotein A to next set of labs to assess risk.  Follow-up After next echocardiogram in summer 2025. Discuss cholesterol results from January 2025 and any new recommendations for Esophagus concerns from GI specialist.      #Chronic Diastolic HF: -TTE 06/2020 with LVEF 60-65%, G1DD -Change lasix to 20mg  M, W, F with extra dose as needed -spironolactone 25mg  daily -Hold potassium while on spiro  -Low Na diet  #HTN: -Continue valsartan 320mg  daily -Continue metop 100mg  XL daily -Start spironolactone 25mg  daily -She will be monitoring Bps at home   Signed, Parke Poisson, MD

## 2023-01-25 NOTE — Patient Instructions (Addendum)
Medication Instructions:  Your physician recommends that you continue on your current medications as directed. Please refer to the Current Medication list given to you today.  *If you need a refill on your cardiac medications before your next appointment, please call your pharmacy*  Lab Work: None  Follow-Up: At [facility], you and your health needs are our priority.  As part of our continuing mission to provide you with exceptional heart care, we have created designated Provider Care Teams.  These Care Teams include your primary Cardiologist (physician) and Advanced Practice Providers (APPs -  Physician Assistants and Nurse Practitioners) who all work together to provide you with the care you need, when you need it.  We recommend signing up for the patient portal called "MyChart".  Sign up information is provided on this After Visit Summary.  MyChart is used to connect with patients for Virtual Visits (Telemedicine).  Patients are able to view lab/test results, encounter notes, upcoming appointments, etc.  Non-urgent messages can be sent to your provider as well.   To learn more about what you can do with MyChart, go to [email or web address].    Your next appointment:    Please see MD after your Echocardiogram (scheduled on 08/17/2023) in July 2025  Provider:   Parke Poisson, MD

## 2023-02-11 ENCOUNTER — Other Ambulatory Visit: Payer: Self-pay | Admitting: Internal Medicine

## 2023-03-07 DIAGNOSIS — I10 Essential (primary) hypertension: Secondary | ICD-10-CM | POA: Diagnosis not present

## 2023-06-12 ENCOUNTER — Ambulatory Visit
Admission: RE | Admit: 2023-06-12 | Discharge: 2023-06-12 | Disposition: A | Discharge status: Home / Self Care | Payer: Medicare HMO | Source: Ambulatory Visit | Attending: Internal Medicine | Admitting: Internal Medicine

## 2023-06-12 DIAGNOSIS — I2510 Atherosclerotic heart disease of native coronary artery without angina pectoris: Secondary | ICD-10-CM | POA: Diagnosis not present

## 2023-06-12 DIAGNOSIS — J849 Interstitial pulmonary disease, unspecified: Secondary | ICD-10-CM

## 2023-06-12 DIAGNOSIS — R911 Solitary pulmonary nodule: Secondary | ICD-10-CM | POA: Diagnosis not present

## 2023-06-12 DIAGNOSIS — I700 Atherosclerosis of aorta: Secondary | ICD-10-CM | POA: Diagnosis not present

## 2023-06-12 DIAGNOSIS — J479 Bronchiectasis, uncomplicated: Secondary | ICD-10-CM | POA: Diagnosis not present

## 2023-06-12 DIAGNOSIS — J8489 Other specified interstitial pulmonary diseases: Secondary | ICD-10-CM

## 2023-06-14 DIAGNOSIS — K219 Gastro-esophageal reflux disease without esophagitis: Secondary | ICD-10-CM | POA: Diagnosis not present

## 2023-06-14 DIAGNOSIS — R0982 Postnasal drip: Secondary | ICD-10-CM | POA: Diagnosis not present

## 2023-06-14 DIAGNOSIS — J029 Acute pharyngitis, unspecified: Secondary | ICD-10-CM | POA: Diagnosis not present

## 2023-07-25 DIAGNOSIS — L821 Other seborrheic keratosis: Secondary | ICD-10-CM | POA: Diagnosis not present

## 2023-07-25 DIAGNOSIS — Z85828 Personal history of other malignant neoplasm of skin: Secondary | ICD-10-CM | POA: Diagnosis not present

## 2023-07-25 DIAGNOSIS — D1801 Hemangioma of skin and subcutaneous tissue: Secondary | ICD-10-CM | POA: Diagnosis not present

## 2023-07-25 DIAGNOSIS — L720 Epidermal cyst: Secondary | ICD-10-CM | POA: Diagnosis not present

## 2023-07-25 DIAGNOSIS — L814 Other melanin hyperpigmentation: Secondary | ICD-10-CM | POA: Diagnosis not present

## 2023-07-30 ENCOUNTER — Other Ambulatory Visit: Payer: Self-pay

## 2023-07-30 MED ORDER — SPIRONOLACTONE 25 MG PO TABS: 25.0000 mg | ORAL_TABLET | Freq: Every day | ORAL | 1 refills | Status: AC

## 2023-08-09 ENCOUNTER — Other Ambulatory Visit: Payer: Self-pay | Admitting: Cardiovascular Disease

## 2023-08-17 ENCOUNTER — Ambulatory Visit: Payer: Self-pay | Admitting: Cardiovascular Disease

## 2023-08-17 ENCOUNTER — Ambulatory Visit (HOSPITAL_COMMUNITY)
Admission: RE | Admit: 2023-08-17 | Discharge: 2023-08-17 | Disposition: A | Discharge status: Home / Self Care | Payer: Medicare HMO | Source: Ambulatory Visit | Attending: Internal Medicine | Admitting: Internal Medicine

## 2023-08-17 DIAGNOSIS — I340 Nonrheumatic mitral (valve) insufficiency: Secondary | ICD-10-CM | POA: Diagnosis not present

## 2023-08-17 DIAGNOSIS — I071 Rheumatic tricuspid insufficiency: Secondary | ICD-10-CM | POA: Insufficient documentation

## 2023-08-17 DIAGNOSIS — I351 Nonrheumatic aortic (valve) insufficiency: Secondary | ICD-10-CM | POA: Insufficient documentation

## 2023-08-17 LAB — ECHOCARDIOGRAM COMPLETE
Area-P 1/2: 3.08 cm2
P 1/2 time: 438 ms
S' Lateral: 2.6 cm

## 2023-08-22 ENCOUNTER — Encounter: Payer: Self-pay | Admitting: Nurse Practitioner

## 2023-08-22 ENCOUNTER — Ambulatory Visit: Attending: Cardiology | Admitting: Nurse Practitioner

## 2023-08-22 VITALS — BP 130/71 | HR 67 | Ht 63.0 in | Wt 204.0 lb

## 2023-08-22 DIAGNOSIS — K224 Dyskinesia of esophagus: Secondary | ICD-10-CM | POA: Diagnosis not present

## 2023-08-22 DIAGNOSIS — I5032 Chronic diastolic (congestive) heart failure: Secondary | ICD-10-CM | POA: Diagnosis not present

## 2023-08-22 DIAGNOSIS — I10 Essential (primary) hypertension: Secondary | ICD-10-CM | POA: Diagnosis not present

## 2023-08-22 DIAGNOSIS — I351 Nonrheumatic aortic (valve) insufficiency: Secondary | ICD-10-CM | POA: Diagnosis not present

## 2023-08-22 DIAGNOSIS — J849 Interstitial pulmonary disease, unspecified: Secondary | ICD-10-CM

## 2023-08-22 DIAGNOSIS — I2510 Atherosclerotic heart disease of native coronary artery without angina pectoris: Secondary | ICD-10-CM | POA: Diagnosis not present

## 2023-08-22 DIAGNOSIS — I340 Nonrheumatic mitral (valve) insufficiency: Secondary | ICD-10-CM | POA: Diagnosis not present

## 2023-08-22 DIAGNOSIS — E785 Hyperlipidemia, unspecified: Secondary | ICD-10-CM

## 2023-08-22 NOTE — Progress Notes (Signed)
 Office Visit    Patient Name: Alison Ayala Date of Encounter: 08/22/2023  Primary Care Provider:  Teresa Channel, MD Primary Cardiologist:  Soyla DELENA Merck, MD  Chief Complaint    78 year old female with a history of CAD s/p DES-LAD in 2022, chronic diastolic heart failure, mitral valve regurgitation, aortic valve regurgitation, hypertension, hyperlipidemia, ILD, nutcracker/jackhammer esophagus and GERD who presents for follow-up related to CAD and valvular heart disease.  Past Medical History    Past Medical History:  Diagnosis Date   CAD (coronary artery disease)    a. s/p DES to LAD 04/2020.   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HCC)    Diverticulosis    GERD (gastroesophageal reflux disease)    H/O hiatal hernia    H/O intraventricular hemorrhage 2004--  SPONTANEOUS   S/P SHUNT   Heart murmur    Hiatal hernia    Hip pain, right OCCASIONAL -- PINCHED NERVE   History of gastric ulcer 2001   Hyperlipidemia    Hypertension    Hypoglycemia    Internal hemorrhoids    Interstitial lung disease (HCC)    Jackhammer esophagus    Mediastinal mass    Mitral valve prolapse    Normal echocardiogram 30 YRS AGO   NSIP (nonspecific interstitial pneumonia) (HCC)    S/P ventriculoperitoneal shunt 2004--- PER PT SHUNT IS WORKING WELL --  NO ISSUES   SAH (subarachnoid hemorrhage) (HCC)    Squamous cell cancer of scalp and skin of neck    mole back of head   Tubular adenoma of colon    Past Surgical History:  Procedure Laterality Date   BILATERAL FRONTAL INTERVENTRICULAR CATHETER VIA TWIST DRILL HOLE  AUG 2004   SPONTANEOUS SUBARACHNOID  HEMORRHAGE AND  INTRAVENTRICULAR HEMORRHAGE   BIOPSY  06/16/2021   Procedure: BIOPSY;  Surgeon: Albertus Gordy HERO, MD;  Location: [facility];  Service: Gastroenterology;;   ORIN MEDIATE RELEASE  07/26/2011   Procedure: CARPAL TUNNEL RELEASE;  Surgeon: Lynwood SHAUNNA Bern, MD;  Location: [facility];   Service: Orthopedics;  Laterality: Right;   CATARACT EXTRACTION W/ INTRAOCULAR LENS  IMPLANT, BILATERAL  2012   BILATERAL   COLONOSCOPY WITH PROPOFOL  N/A 06/16/2021   Procedure: COLONOSCOPY WITH PROPOFOL ;  Surgeon: Albertus Gordy HERO, MD;  Location: [facility];  Service: Gastroenterology;  Laterality: N/A;   CORONARY IMAGING/OCT N/A 04/08/2020   Procedure: INTRAVASCULAR IMAGING/OCT;  Surgeon: Verlin Lonni BIRCH, MD;  Location: [facility];  Service: Cardiovascular;  Laterality: N/A;   CORONARY STENT INTERVENTION N/A 04/08/2020   Procedure: CORONARY STENT INTERVENTION;  Surgeon: Verlin Lonni BIRCH, MD;  Location: [facility];  Service: Cardiovascular;  Laterality: N/A;   DUPUYTREN CONTRACTURE RELEASE  07/26/2011   Procedure: DUPUYTREN CONTRACTURE RELEASE;  Surgeon: Lynwood SHAUNNA Bern, MD;  Location: [facility];  Service: Orthopedics;  Laterality: Right;  EXCISION OF DUPYTRENS BAND THUMB- INDEX WEB SPACE   ESOPHAGEAL MANOMETRY N/A 01/27/2019   Procedure: ESOPHAGEAL MANOMETRY (EM);  Surgeon: Albertus Gordy HERO, MD;  Location: [facility];  Service: Gastroenterology;  Laterality: N/A;   ESOPHAGOGASTRODUODENOSCOPY N/A 06/16/2021   Procedure: ESOPHAGOGASTRODUODENOSCOPY (EGD);  Surgeon: Albertus Gordy HERO, MD;  Location: [facility];  Service: Gastroenterology;  Laterality: N/A;   EYE SURGERY Bilateral    scrapped corneas   LEFT HEART CATH AND CORONARY ANGIOGRAPHY N/A 04/08/2020   Procedure: LEFT HEART CATH AND CORONARY ANGIOGRAPHY;  Surgeon: Verlin Lonni BIRCH, MD;  [facility]: [facility];  Service: Cardiovascular;  Laterality: N/A;   LEFT HEART CATH AND CORONARY ANGIOGRAPHY N/A 10/05/2021   Procedure: LEFT HEART CATH AND CORONARY ANGIOGRAPHY;  Surgeon: Darron Deatrice LABOR, MD;  Location: [facility];  Service: Cardiovascular;  Laterality: N/A;   LUNG BIOPSY Right 08/07/2017   Procedure: Open LUNG BIOPSY;  Surgeon: Fleeta Hanford Coy, MD;  [facility];  Service:  Thoracic;  Laterality: Right;   RESECTION OF MEDIASTINAL MASS N/A 08/07/2017   Procedure: RESECTION OF MEDIASTINAL MASS;  Surgeon: Fleeta Hanford Coy, MD;  Location: [facility];  Service: Thoracic;  Laterality: N/A;   RETINAL DETACHMENT REPAIR W/ SCLERAL BUCKLE LE  03-10-2011   LEFT EYE   STERNOTOMY N/A 08/07/2017   Procedure: STERNOTOMY;  Surgeon: Fleeta Hanford Coy, MD;  Location: [facility];  Service: Thoracic;  Laterality: N/A;   VENTRICULOPERITONEAL SHUNT  10-08-2002   RIGHT FRONTAL FOR COMMUNICATING HYDROCEPHALUS    Allergies  Allergies  Allergen Reactions   Brilinta  [Ticagrelor ] Shortness Of Breath   Coreg [Carvedilol] Palpitations and Other (See Comments)    Abdominal bloating   Claritin [Loratadine] Other (See Comments)    Blurred vision Dry eyes   Lipitor [Atorvastatin] Other (See Comments)    Fatigue    Lisinopril Other (See Comments)    drunk feeling     Labs/Other Studies Reviewed    The following studies were reviewed today:  Cardiac Studies & Procedures   ______________________________________________________________________________________________ CARDIAC CATHETERIZATION  CARDIAC CATHETERIZATION 10/05/2021  Conclusion   Ost Cx lesion is 20% stenosed.   Non-stenotic Ost LAD to Prox LAD lesion was previously treated.  1.  Widely patent ostial LAD stent with no significant restenosis.  The stent extends 1 to 2 mm into the left main with partial jailing of the left circumflex.  However, there is only mild ostial left circumflex stenosis.  The coronary arteries are otherwise normal. 2.  Left ventricular angiography was not performed.  LVEDP was mildly elevated.  Recommendations: Suspect noncardiac chest pain. Continue medical therapy.  Findings Coronary Findings Diagnostic  Dominance: Left  Left Anterior Descending Vessel is large. Non-stenotic Ost LAD to Prox LAD lesion was previously treated.  Left Circumflex Vessel is large. Ost Cx lesion is 20%  stenosed.  Right Coronary Artery Vessel is small.  Intervention  No interventions have been documented.   CARDIAC CATHETERIZATION  CARDIAC CATHETERIZATION 04/08/2020  Conclusion  Ost LAD to Prox LAD lesion is 99% stenosed.  A drug-eluting stent was successfully placed using a SYNERGY XD 4.0X16.  Post intervention, there is a 0% residual stenosis.  The left ventricular systolic function is normal.  LV end diastolic pressure is normal.  The left ventricular ejection fraction is 55-65% by visual estimate.  There is no mitral valve regurgitation.  1. Severe ostial/proximal LAD stenosis 2. Successful PTCA/DES x 1 ostial/proximal LAD with optimization with OCT imaging 3. No obstructive disease in the large dominant Circumflex artery. 4. Small non-dominant RCA 5. Normal LV systolic function  Recommendations: Continue DAPT with ASA and Brilinta  for at least six months but preferably one year if she tolerates it well. If necessary, the Brilinta  could be changed to Plavix  in 3 months. Same day post PCI discharge.  Findings Coronary Findings Diagnostic  Dominance: Left  Left Anterior Descending Vessel is large. Ost LAD to Prox LAD lesion is 99% stenosed.  Left Circumflex Vessel is large.  Right Coronary Artery Vessel is small.  Intervention  Ost LAD to Prox LAD lesion Stent CATH VISTA GUIDE 6FR  XBLAD3.5 guide catheter was inserted. Lesion crossed with guidewire using a WIRE COUGAR XT STRL 190CM. Pre-stent angioplasty was performed using a BALLOON SAPPHIRE 2.5X15. A drug-eluting stent was successfully placed using a SYNERGY XD 4.0X16. Stent strut is well apposed. Post-stent angioplasty was performed using a BALLOON SAPPHIRE [place] 4.0X8. Post-Intervention Lesion Assessment The intervention was successful. Pre-interventional TIMI flow is 3. Post-intervention TIMI flow is 3. No complications occurred at this lesion. There is a 0% residual stenosis post intervention.   STRESS  TESTS  EXERCISE TOLERANCE TEST (ETT) 07/25/2017  Interpretation Summary  Blood pressure demonstrated a hypertensive response to exercise.  There was no ST segment deviation noted during stress.  No T wave inversion was noted during stress.  Blood pressure demonstrated a hypertensive response to exercise  Overall, the patient's exercise capacity was moderately impaired.  The patient's response to exercise was inadequate for diagnosis  Arrhythmias during stress: occasional PVCs  Duke Treadmill Score: intermediate risk  Indeterminate stress test - patient did not reach THR. Poor exercise capacity limited by dyspnea. No diagnostic ischemic EKG changes at given workload. PVC's noted with exercise and in recovery. Consider further ischemic evaluation with pharmacologic perfusion, CT coronary angiography or coronary angiography if necessary.   ECHOCARDIOGRAM  ECHOCARDIOGRAM COMPLETE 08/17/2023  Narrative ECHOCARDIOGRAM REPORT    Patient Name:   Alison Ayala Urwin Date of Exam: 08/17/2023 Medical Rec #:  [id number]         Height:       63.0 in Accession #:    [id number]        Weight:       213.4 lb Date of Birth:  07-25-1945         BSA:          1.988 m Patient Age:    78 years          BP:           126/58 mmHg Patient Gender: F                 HR:           56 bpm. Exam Location:  [address]  Procedure: 2D Echo, Cardiac Doppler, Color Doppler, 3D Echo and Strain Analysis (Both Spectral and Color Flow Doppler were utilized during procedure).  Indications:    Moderate Aortic Regurgitation I35.1  History:        Patient has prior history of Echocardiogram examinations, most recent 08/18/2022. CAD, Coronary Stent Intervention, Mitral Valve Prolapse; Risk Factors:Hypertension and Dyslipidemia.  Sonographer:    Augustin Seals RDCS Referring Phys: [id number] HEATHER E P[place]  IMPRESSIONS   1. Left ventricular ejection fraction, by estimation, is 60 to 65%. The left  ventricle has normal function. The left ventricle has no regional wall motion abnormalities. There is mild asymmetric left ventricular hypertrophy of the basal and septal segments. Left ventricular diastolic parameters are consistent with Grade I diastolic dysfunction (impaired relaxation). The average left ventricular global longitudinal strain is -23.5 %. The global longitudinal strain is normal. 2. Right ventricular systolic function is normal. The right ventricular size is normal. There is normal pulmonary artery systolic pressure. 3. Left atrial size was mildly dilated. 4. The mitral valve is abnormal. Mild to moderate mitral valve regurgitation. No evidence of mitral stenosis. 5. Tricuspid valve regurgitation is moderate. 6. The aortic valve is tricuspid. There is mild calcification of the aortic valve. There is mild thickening of the aortic valve. Aortic valve regurgitation is mild to moderate. Aortic valve  sclerosis is present, with no evidence of aortic valve stenosis. 7. The inferior vena cava is normal in size with greater than 50% respiratory variability, suggesting right atrial pressure of 3 mmHg.  FINDINGS Left Ventricle: Left ventricular ejection fraction, by estimation, is 60 to 65%. The left ventricle has normal function. The left ventricle has no regional wall motion abnormalities. The average left ventricular global longitudinal strain is -23.5 %. Strain was performed and the global longitudinal strain is normal. The left ventricular internal cavity size was normal in size. There is mild asymmetric left ventricular hypertrophy of the basal and septal segments. Left ventricular diastolic parameters are consistent with Grade I diastolic dysfunction (impaired relaxation).  Right Ventricle: The right ventricular size is normal. No increase in right ventricular wall thickness. Right ventricular systolic function is normal. There is normal pulmonary artery systolic pressure. The tricuspid  regurgitant velocity is 2.80 m/s, and with an assumed right atrial pressure of 3 mmHg, the estimated right ventricular systolic pressure is 34.4 mmHg.  Left Atrium: Left atrial size was mildly dilated.  Right Atrium: Right atrial size was normal in size.  Pericardium: There is no evidence of pericardial effusion.  Mitral Valve: The mitral valve is abnormal. There is mild thickening of the mitral valve leaflet(s). There is mild calcification of the mitral valve leaflet(s). Mild to moderate mitral valve regurgitation. No evidence of mitral valve stenosis.  Tricuspid Valve: The tricuspid valve is normal in structure. Tricuspid valve regurgitation is moderate . No evidence of tricuspid stenosis.  Aortic Valve: The aortic valve is tricuspid. There is mild calcification of the aortic valve. There is mild thickening of the aortic valve. Aortic valve regurgitation is mild to moderate. Aortic regurgitation PHT measures 438 msec. Aortic valve sclerosis is present, with no evidence of aortic valve stenosis.  Pulmonic Valve: The pulmonic valve was normal in structure. Pulmonic valve regurgitation is trivial. No evidence of pulmonic stenosis.  Aorta: The aortic root is normal in size and structure.  Venous: The inferior vena cava is normal in size with greater than 50% respiratory variability, suggesting right atrial pressure of 3 mmHg.  IAS/Shunts: No atrial level shunt detected by color flow Doppler.  Additional Comments: 3D was performed not requiring image post processing on an independent workstation and was normal.   LEFT VENTRICLE PLAX 2D LVIDd:         4.10 cm   Diastology LVIDs:         2.60 cm   LV e' medial:    6.31 cm/s LV PW:         1.10 cm   LV E/e' medial:  14.7 LV IVS:        1.10 cm   LV e' lateral:   6.85 cm/s LVOT diam:     2.00 cm   LV E/e' lateral: 13.5 LV SV:         110 LV SV Index:   55        2D Longitudinal Strain LVOT Area:     3.14 cm  2D Strain GLS (A4C):   -21.9  % 2D Strain GLS (A3C):   -24.4 % 2D Strain GLS (A2C):   -24.1 % 2D Strain GLS Avg:     -23.5 %  3D Volume EF: 3D EF:        66 % LV EDV:       136 ml LV ESV:       46 ml LV SV:  89 ml  RIGHT VENTRICLE RV Basal diam:  3.80 cm RV Mid diam:    2.80 cm RV S prime:     12.90 cm/s TAPSE (M-mode): 2.7 cm  LEFT ATRIUM             Index        RIGHT ATRIUM           Index LA diam:        4.00 cm 2.01 cm/m   RA Area:     16.90 cm LA Vol (A2C):   69.8 ml 35.12 ml/m  RA Volume:   42.30 ml  21.28 ml/m LA Vol (A4C):   79.8 ml 40.15 ml/m LA Biplane Vol: 78.7 ml 39.59 ml/m AORTIC VALVE LVOT Vmax:   152.00 cm/s LVOT Vmean:  97.900 cm/s LVOT VTI:    0.351 m AI PHT:      438 msec  AORTA Ao Root diam: 2.60 cm Ao Asc diam:  3.40 cm  MITRAL VALVE                TRICUSPID VALVE MV Area (PHT): 3.08 cm     TR Peak grad:   31.4 mmHg MV Decel Time: 246 msec     TR Vmax:        280.00 cm/s MV E velocity: 92.70 cm/s MV A velocity: 114.00 cm/s  SHUNTS MV E/A ratio:  0.81         Systemic VTI:  0.35 m Systemic Diam: 2.00 cm  Maude Emmer MD Electronically signed by Maude Emmer MD Signature Date/Time: 08/17/2023/11:23:45 AM    Final      CT SCANS  CT CORONARY FRACTIONAL FLOW RESERVE DATA PREP 03/31/2020  Narrative EXAM: FFRCT ANALYSIS  FINDINGS: FFRct analysis was performed on the original cardiac CT angiogram dataset. Diagrammatic representation of the FFRct analysis is provided in a separate PDF document in PACS. This dictation was created using the PDF document and an interactive 3D model of the results. 3D model is not available in the EMR/PACS. Normal FFR range is >0.80.  1. Left Main: FFRct 0.99.  2. LAD: Ostial LAD FFRct 0.96.  Proximal FFRct 0.73.  Mid FFRct 0.67  3. LCX: FFRct 0.98 proximal, 0.96 mid, 0.94 distal. OM1 FFRct 0.94. OM2 FFRct 0.88. L-PDA FFRct 0.57.  4. RCA: FFRct 0.93.  IMPRESSION: 1. FFRct findings are consistent with severe stenosis in  the ostial LAD.  2. FFRct findings are consistent with severe stenosis in the L-PDA. On comparison with the coronary CT-A, this region appears small and tortuous, but not severely stenotic.  3.  Recommend cardiac catheterization.  Tiffany C. Raford, MD   Electronically Signed By: Annabella Raford On: 04/01/2020 15:35   CT SCANS  CT CORONARY MORPH W/CTA COR W/SCORE 03/30/2020  Addendum 03/31/2020 12:35 PM ADDENDUM REPORT: 03/31/2020 12:33  CLINICAL DATA:  78F with hypertension, hyperlipidemia, mitral valve prolapse and a mediastinal mass for preoperative risk assessment.  EXAM: Cardiac/Coronary  CT  TECHNIQUE: The patient was scanned on a [employer].  FINDINGS: A 120 kV prospective scan was triggered in the descending thoracic aorta at 111 HU's. Axial non-contrast 3 mm slices were carried out through the heart. The data set was analyzed on a dedicated work station and scored using the Agatson method. Gantry rotation speed was 250 msecs and collimation was .6 mm. No beta blockade and 0.8 mg of sl NTG was given. The 3D data set was reconstructed in 5% intervals of the 67-82 % of  the R-R cycle. Diastolic phases were analyzed on a dedicated work station using MPR, MIP and VRT modes. The patient received 80 cc of contrast.  Aorta: Normal size. Calcification of the aortic root and descending aorta. No dissection.  Aortic Valve:  Trileaflet.  No calcifications.  Coronary Arteries:  Normal coronary origin.  Right dominance.  RCA is a small, non-dominant artery.  There is no plaque.  Left main is a large artery that gives rise to LAD, RI, and LCX arteries. Minimal (<25%) calcified plaque.  LAD is a large vessel that has severe (>70%) soft plaque at the ostium. There is a tiny D1 that is diffusely diseased. D2 and D3 are small vessels without significant disease.  LCX is a dominant artery that gives rise to one large OM1 branch and L-PDA. OM1 tapers  abruptly and may be occluded but is difficult to evaluate. There is no significant disease in the LCX or L-PDA.  Other findings:  Normal pulmonary vein drainage into the left atrium.  Normal let atrial appendage without a thrombus.  Normal size of the pulmonary artery.  Mild mitral annular calcification.  IMPRESSION: 1. Coronary calcium  score of 0.687. This was 26th percentile for age and sex matched control.  2. Normal coronary origin with left dominance.  3. There is severe (>70%) soft plaque in the ostial LAD. CAD-RADS 4.  4.  Will send for FFRct.  Annabella Scarce, MD   Electronically Signed By: Annabella Scarce On: 03/31/2020 12:33  Narrative EXAM: OVER-READ INTERPRETATION  CT CHEST  The following report is an over-read performed by radiologist Dr. Toribio Aye of [facility] on 03/30/2020. This over-read does not include interpretation of cardiac or coronary anatomy or pathology. The coronary calcium  score/coronary CTA interpretation by the cardiologist is attached.  COMPARISON:  None.  FINDINGS: Aortic atherosclerosis. Moderate hiatal hernia. Dependent areas of subsegmental atelectasis in the right lower lobe. Within the visualized portions of the thorax there are no suspicious appearing pulmonary nodules or masses, there is no acute consolidative airspace disease, no pleural effusions, no pneumothorax and no lymphadenopathy. Visualized portions of the upper abdomen are unremarkable. There are no aggressive appearing lytic or blastic lesions noted in the visualized portions of the skeleton. Median sternotomy wires. VP shunt catheter in the subcutaneous fat of the right anterior chest wall incidentally noted.  IMPRESSION: 1.  Aortic Atherosclerosis (ICD10-I70.0). 2. Moderate-sized hiatal hernia.  Electronically Signed: By: Toribio Aye M.D. On: 03/30/2020 13:25      ______________________________________________________________________________________________     Recent Labs: No results found for requested labs within last 365 days.  Recent Lipid Panel    Component Value Date/Time   CHOL 141 01/26/2022 1138   TRIG 131 01/26/2022 1138   HDL 49 01/26/2022 1138   CHOLHDL 2.9 01/26/2022 1138   LDLCALC 69 01/26/2022 1138    History of Present Illness    78 year old female with the above past medical history including CAD s/p DES-LAD in 2022, chronic diastolic heart failure, mitral valve regurgitation, aortic valve regurgitation, hypertension, hyperlipidemia, ILD, nutcracker/jackhammer esophagus and GERD.  Coronary CTA in 03/2020 showed severe stenosis in the ostial LAD.  Cardiac catheterization in 04/2020 revealed severe ostial/proximal LAD stenosis, s/p PTCA/DES x 1, no other obstructive disease.  She was hospitalized in 8/23 in setting of chest pain.  Repeat cardiac catheterization at that time revealed 20% ostial LCx lesion, widely patent LAD stent, no other significant stenosis, mildly elevated LVEDP.  Her symptoms of chest pain are felt to be  noncardiac, ongoing medical therapy was advised.  Of note, she does have a history of nutcracker/drastic MR esophagus, followed by GI.  It has been previously noted that her symptoms of chest pain are possibly related to esophageal spasm.  She was last seen in the office on 01/25/2023 and was stable from a cardiac standpoint.  She reported persistent chest pain, again this was noted to likely be esophageal in origin.  Most recent echocardiogram in 08/2023 showed EF 65%, normal LV function, no RWMA, mild asymmetric LVH of the basal septal segments, G1 DD, normal RV systolic function, mild to moderate mitral valve regurgitation, mild to moderate aortic valve regurgitation.  She presents today for follow-up.  Since her last visit she has been stable overall from a cardiac standpoint. The past 2 days she has had some  increased fatigue, mild shortness of breath with activity.  She denies chest pain, symptoms similar to prior anginal equivalent, denies edema, PND, orthopnea, weight gain. She reports occasional orthostatic dizziness. She denies palpitations, presyncope, syncope.  Overall, her symptoms have been stable.  Home Medications    Current Outpatient Medications  Medication Sig Dispense Refill   acetaminophen  (TYLENOL ) 500 MG tablet Take 1-2 tablets (500-1,000 mg total) by mouth 2 (two) times daily as needed for moderate pain or headache. 30 tablet 0   buPROPion  (WELLBUTRIN  XL) 300 MG 24 hr tablet Take 300 mg by mouth every morning.     carboxymethylcellulose (REFRESH PLUS) 0.5 % SOLN Place 1 drop into both eyes in the morning.     Cholecalciferol (D3 2000 PO) Take 2,000 mcg by mouth daily.     clopidogrel  (PLAVIX ) 75 MG tablet TAKE 1 TABLET BY MOUTH EVERY DAY 90 tablet 3   furosemide  (LASIX ) 20 MG tablet Take 1 tablet (20 mg) on Monday, Wednesday, Friday. Take additional 1 tablet (20mg ) as needed on those days. 30 tablet 0   metoprolol  succinate (TOPROL -XL) 100 MG 24 hr tablet Take 1 tablet (100 mg total) by mouth daily. Take with or immediately following a meal. 90 tablet 3   nitroGLYCERIN  (NITROSTAT ) 0.4 MG SL tablet DISSOLVE 1 TABLET UNDER THE TONGUE ONCE FOR EPIGASTRIC PAIN/CHEST PAIN NOT RELIEVED BY PEPPERMINT ALTOIDS. IF THIS DOES NOT RESOLVE PAIN, CALL DR! 75 tablet 2   ondansetron  (ZOFRAN -ODT) 4 MG disintegrating tablet TAKE 1 TABLET BY MOUTH EVERY 8 HOURS AS NEEDED FOR NAUSEA AND VOMITING 30 tablet 0   rosuvastatin  (CRESTOR ) 20 MG tablet TAKE 1 TABLET BY MOUTH EVERY DAY 90 tablet 1   spironolactone  (ALDACTONE ) 25 MG tablet Take 1 tablet (25 mg total) by mouth daily. 90 tablet 1   valsartan  (DIOVAN ) 320 MG tablet Take 1 tablet (320 mg total) by mouth daily. 90 tablet 3   No current facility-administered medications for this visit.     Review of Systems    She denies chest pain, palpitations,  dyspnea, pnd, orthopnea, n, v, dizziness, syncope, edema, weight gain, or early satiety. All other systems reviewed and are otherwise negative except as noted above.   Physical Exam    VS:  BP 130/71   Pulse 67   Ht 5' 3 (1.6 m)   Wt 204 lb (92.5 kg)   SpO2 95%   BMI 36.14 kg/m   GEN: Well nourished, well developed, in no acute distress. HEENT: normal. Neck: Supple, no JVD, carotid bruits, or masses. Cardiac: RRR, no murmurs, rubs, or gallops. No clubbing, cyanosis, edema.  Radials/DP/PT 2+ and equal bilaterally.  Respiratory:  Respirations regular and  unlabored, clear to auscultation bilaterally. GI: Soft, nontender, nondistended, BS + x 4. MS: no deformity or atrophy. Skin: warm and dry, no rash. Neuro:  Strength and sensation are intact. Psych: Normal affect.  Accessory Clinical Findings    ECG personally reviewed by me today -    - no EKG in office today.    Lab Results  Component Value Date   WBC 4.6 10/05/2021   HGB 13.1 10/05/2021   HCT 39.3 10/05/2021   MCV 96.3 10/05/2021   PLT 230 10/05/2021   Lab Results  Component Value Date   CREATININE 1.11 (H) 07/27/2022   BUN 27 07/27/2022   NA 142 07/27/2022   K 4.6 07/27/2022   CL 106 07/27/2022   CO2 24 07/27/2022   Lab Results  Component Value Date   ALT 14 01/26/2022   AST 17 01/26/2022   ALKPHOS 74 01/26/2022   BILITOT 0.4 01/26/2022   Lab Results  Component Value Date   CHOL 141 01/26/2022   HDL 49 01/26/2022   LDLCALC 69 01/26/2022   TRIG 131 01/26/2022   CHOLHDL 2.9 01/26/2022    No results found for: HGBA1C  Assessment & Plan    1. CAD: S/p DES-LAD in 2022. Stable with no anginal symptoms. No indication for ischemic evaluation.  Continue Plavix , metoprolol , valsartan , spironolactone , Lasix , and Crestor .  2. Chronic diastolic heart failure: Most recent echocardiogram in 08/2023 showed EF 65%, normal LV function, no RWMA, mild asymmetric LVH of the basal septal segments, G1 DD, normal RV  systolic function, mild to moderate mitral valve regurgitation, mild to moderate aortic valve regurgitation. Euvolemic and well compensated on exam.  Continue current medications as above.  3. Valvular heart disease: Most recent echo showed mild to moderate mitral valve radiation, mild to moderate aortic valve regurgitation.  Consider repeat echocardiogram in 1 year, sooner if clinical indicated.  4. Hypertension: BP well controlled. Continue current antihypertensive regimen.   5. Hyperlipidemia: LDL was 73 in 02/2023.  Continue Crestor .  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  7. ILD: She notes mildly increased shortness of breath with activity, generalized fatigue over the past 2 days.  She questions whether or not this could be related to the weather and extreme heat.  Continue to monitor symptoms.  She has follow-up scheduled with pulmonology  in 09/2023.   8. Disposition: Follow-up in 6 months.       Damien JAYSON Braver, NP 08/22/2023, 10:09 AM

## 2023-08-22 NOTE — Patient Instructions (Signed)
 Medication Instructions:  Your physician recommends that you continue on your current medications as directed. Please refer to the Current Medication list given to you today.  *If you need a refill on your cardiac medications before your next appointment, please call your pharmacy*  Lab Work: NONE ordered at this time of appointment    Testing/Procedures: NONE ordered at this time of appointment    Follow-Up: At [facility], you and your health needs are our priority.  As part of our continuing mission to provide you with exceptional heart care, our providers are all part of one team.  This team includes your primary Cardiologist (physician) and Advanced Practice Providers or APPs (Physician Assistants and Nurse Practitioners) who all work together to provide you with the care you need, when you need it.  Your next appointment:   6 month(s)  Provider:   Gayatri A Acharya, MD    We recommend signing up for the patient portal called MyChart.  Sign up information is provided on this After Visit Summary.  MyChart is used to connect with patients for Virtual Visits (Telemedicine).  Patients are able to view lab/test results, encounter notes, upcoming appointments, etc.  Non-urgent messages can be sent to your provider as well.   To learn more about what you can do with MyChart, go to [email or web address].

## 2023-08-23 DIAGNOSIS — H02411 Mechanical ptosis of right eyelid: Secondary | ICD-10-CM | POA: Diagnosis not present

## 2023-08-23 DIAGNOSIS — H0279 Other degenerative disorders of eyelid and periocular area: Secondary | ICD-10-CM | POA: Diagnosis not present

## 2023-08-23 DIAGNOSIS — Z01818 Encounter for other preprocedural examination: Secondary | ICD-10-CM | POA: Diagnosis not present

## 2023-08-23 DIAGNOSIS — H57811 Brow ptosis, right: Secondary | ICD-10-CM | POA: Diagnosis not present

## 2023-08-23 DIAGNOSIS — H53483 Generalized contraction of visual field, bilateral: Secondary | ICD-10-CM | POA: Diagnosis not present

## 2023-08-23 DIAGNOSIS — H02831 Dermatochalasis of right upper eyelid: Secondary | ICD-10-CM | POA: Diagnosis not present

## 2023-08-28 DIAGNOSIS — D3502 Benign neoplasm of left adrenal gland: Secondary | ICD-10-CM | POA: Diagnosis not present

## 2023-08-28 DIAGNOSIS — I2510 Atherosclerotic heart disease of native coronary artery without angina pectoris: Secondary | ICD-10-CM | POA: Diagnosis not present

## 2023-08-28 DIAGNOSIS — E785 Hyperlipidemia, unspecified: Secondary | ICD-10-CM | POA: Diagnosis not present

## 2023-08-28 DIAGNOSIS — F3341 Major depressive disorder, recurrent, in partial remission: Secondary | ICD-10-CM | POA: Diagnosis not present

## 2023-08-28 DIAGNOSIS — N183 Chronic kidney disease, stage 3 unspecified: Secondary | ICD-10-CM | POA: Diagnosis not present

## 2023-08-28 DIAGNOSIS — J849 Interstitial pulmonary disease, unspecified: Secondary | ICD-10-CM | POA: Diagnosis not present

## 2023-08-28 DIAGNOSIS — E538 Deficiency of other specified B group vitamins: Secondary | ICD-10-CM | POA: Diagnosis not present

## 2023-08-28 DIAGNOSIS — I129 Hypertensive chronic kidney disease with stage 1 through stage 4 chronic kidney disease, or unspecified chronic kidney disease: Secondary | ICD-10-CM | POA: Diagnosis not present

## 2023-08-28 DIAGNOSIS — R7303 Prediabetes: Secondary | ICD-10-CM | POA: Diagnosis not present

## 2023-08-28 DIAGNOSIS — I700 Atherosclerosis of aorta: Secondary | ICD-10-CM | POA: Diagnosis not present

## 2023-08-28 DIAGNOSIS — R946 Abnormal results of thyroid function studies: Secondary | ICD-10-CM | POA: Diagnosis not present

## 2023-08-28 DIAGNOSIS — I5032 Chronic diastolic (congestive) heart failure: Secondary | ICD-10-CM | POA: Diagnosis not present

## 2023-08-30 ENCOUNTER — Ambulatory Visit: Admitting: Internal Medicine

## 2023-08-30 DIAGNOSIS — J849 Interstitial pulmonary disease, unspecified: Secondary | ICD-10-CM

## 2023-08-30 DIAGNOSIS — J8489 Other specified interstitial pulmonary diseases: Secondary | ICD-10-CM

## 2023-08-30 LAB — PULMONARY FUNCTION TEST
DL/VA % pred: 89 %
DL/VA: 3.7 ml/min/mmHg/L
DLCO unc % pred: 76 %
DLCO unc: 13.99 ml/min/mmHg
FEF 25-75 Pre: 2.29 L/s
FEF2575-%Pred-Pre: 157 %
FEV1-%Pred-Pre: 113 %
FEV1-Pre: 2.18 L
FEV1FVC-%Pred-Pre: 108 %
FEV6-%Pred-Pre: 110 %
FEV6-Pre: 2.69 L
FEV6FVC-%Pred-Pre: 104 %
FVC-%Pred-Pre: 105 %
FVC-Pre: 2.71 L
Pre FEV1/FVC ratio: 81 %
Pre FEV6/FVC Ratio: 99 %

## 2023-08-30 NOTE — Patient Instructions (Signed)
Spiro/DLCO performed today. 

## 2023-08-30 NOTE — Progress Notes (Signed)
Spiro/DLCO performed today. 

## 2023-10-10 NOTE — Patient Instructions (Incomplete)
 ICD-10-CM   1. NSIP (nonspecific interstitial pneumonia) (HCC)  J84.89     2. ILD (interstitial lung disease) (HCC)  J84.9     3. Vaccine counseling  Z71.85     4. Pneumococcal vaccination indicated  Z91.89         Stable disease based on symptoms, exam and exercise test PFT is givingin mildly confusing data but could be because of sinus issues that day of testing and coaching - . Lst CT May 2023 with stability since 2020  Plan - continue to monitor off prednisone  - 6- months do  HRCT - 6 months do spiro and dlco - if lungs ever get worse, a drug called Ofev +/- clinical trials is an option - lose weight - talk to PCP Teresa Channel, MD if medicines are an options  - Prevnar-21 vaccine 12/11/2022 - but we are out of stock  - get on your own  - then not needed   Followup 6 months but after  HRCT and PFT - ; symptoms score and walk test at followup

## 2023-10-10 NOTE — Progress Notes (Unsigned)
 IOV 07/03/2017  Chief Complaint  Patient presents with   Pulm Consult    Referred by Dr. Montie Pizza for possible ILD. Per patient, she does have some occasional SOB and chest tightness.    Alison Ayala 78 y.o. female [address]  [place] [address] -referred for evaluation of potential interstitial lung disease.  According to the patient on routine exam it was felt that the sternal head of the clavicles was a little bit prominent so therefore a chest x-ray was done by the primary care physician which was not revealing.  Subsequently patient underwent CT scan of the chest documented below that I personally visualized and agree with the findings.  The 2 significant findings are anterior mediastinal mass with partial calcification and also possible interstitial lung disease and therefore she has been referred here.  She is denying any acute complaints . SHe has appt with Dr Fleeta Brooke for anterior mediastinal mass  We performed the pulmonary integrated interstitial lung disease questionnaire and for the symptoms she denied any cough but she did admit to shortness of breath that started insidiously approximately 2 years ago and for the last 6 months is gradually gotten worse.  It is not episodic.  It is present on exertion and relieved by rest.  Today when we walked her she did drop her pulse ox greater than 3 points but did not go below 88% her heart rate did respond but it did not go over 90/min.  On a level 5 scale she rates level 3 dyspnea for picking up and straightening, sweeping and vacuuming walking up a hill and walking upstairs.  She rates a level 2 dyspnea for walking on a level ground at her own pace and walking with appears, making the bed, shopping and doing laundry.  She gives a level 1 dyspnea for standing up and dressing but otherwise all other activities a level 0.  Review of systems: She has chronic fatigue and arthralgia.  She also has dysphagia for the  last few to several years [4-5 years].  She has had dry eyes for the last few decades and dry mouth for the last 1 year.  She admits to acid reflux disease for the last 40 years and on and off cold sores in her mouth.  There is no recurrent fever or weight loss or Raynaud  Past medical history: Positive for acid reflux for several decades but negative for asthma, COPD, heart failure, rheumatoid arthritis, other collagen vascular disease, sleep apnea, immune system disorder, pulmonary hypertension, thyroid  disease, stroke, seizures, hepatitis, tuberculosis, kidney disease, pneumonia or blood clots, heart disease pleurisy  Family history of lung disease: Denies any pulmonary fibrosis or any other lung disease  Exposure history: Denies any smoking, drugs of abuse.  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  In particular she denies any occupation that might predispose her to hypersensitivity pneumonitis or inorganic dust related interstitial lung disease.  Pulmonary toxicity history: Extensive drug list causing pulmonary toxicity reviewed.  And negative for all of that     CT chest 06/19/17 = personally visualized the CT chest and agree with the findings of large anterior mediastinal mass and possible ILD  - IMPRESSION: 1. No focal soft tissue mass or other abnormality to account for the  perceived palpable abnormality in the suprasternal region. 2. However, there is a large anterior mediastinal mass which demonstrates heterogeneous enhancement and calcification, with several smaller adjacent lesions. These findings are concerning for potential neoplasm, with primary differential considerations including germ cell neoplasm or lesion of thymic origin. On PET scan 07/16/17 - 4.6 cm anterior mediastinal mass with dense central calcifications  is hypermetabolic. SUV max is 6.55. Findings highly suspicious for a thymic neoplasmThoracic surgical consultation is strongly recommended. 3. 1.7 x 2.0 cm left adrenal nodule is indeterminate. Further characterization with nonemergent adrenal protocol CT scan is recommended in the near future to exclude the possibility of a metastatic lesion. 4. Unusual appearance of the lung parenchyma which is concerning for potential interstitial lung disease. Outpatient referral to pulmonology for further evaluation is suggested, as well as a follow-up high-resolution chest CT in 6-12 months to assess for temporal changes in the appearance of the lung parenchyma. 5. Multiple healing left-sided rib fractures, as above. These are all nondisplaced or minimally displaced. No associated pneumothorax noted at this time.     Electronically Signed   By: Toribio Aye M.D.   On: 06/20/2017 09:20   Results for Attar, Alison Ayala (MRN [id number]) as of 07/03/2017 13:34  Ref. Range 07/26/2011 08:59  Hemoglobin Latest Ref Range: 12.0 - 15.0 g/dL 79.2 (H)    OV 2/81/7980  Chief Complaint  Patient presents with   Follow-up    Pt was hospitalized 7/2-7/7 due to having mediastinal mass resection and a lung biopsy was also performed. HRCT performed 6/3, PFT performed 6/5, and PET scan performed 6/10.  Pt states overall she has been doing good but is anxious to know about results from biopsy.    Follow-up mediastinal massAnteriorand PET hot Follow-up interstitial lung disease  She has now been discharged from post biopsy. She is doing well. She feels baseline. Shortness of breath with exertion is the same and it is mild. Walking desaturation test is documented below is unchanged. A biopsy date was 08/07/2017. The anterior mediastinal mass shows Castleman'Ayala disease. Based on PET scan I suspect this is unicentric. She has follow-up with thoracic service is pending. She also had open lung biopsy for early  interstitial lung disease. The formal report is still pending but I discussed with pathologist Dr. Rebbecca who feels that this is very early and mild interstitial lung disease without any specific pattern but suspects this is early NSIP. There is no granuloma.     OV 11/26/2017  Subjective:  Patient ID: Alison Ayala, female , DOB: [birth year] , age 53 y.o. , MRN: [id number] , ADDRESS: [address] [place] [address]   11/26/2017 -   Chief Complaint  Patient presents with   Follow-up    Doing well feeling the same as last visit.    Follow-up mediastinal massAnteriorand PET hot = Castleman'Ayala disease, you are eccentric based on PET scan, July 2019 Follow-up interstitial lung disease -likely NSIP, July 2019   Alison Quadasia Newsham Polansky 78 y.o. -at this point in time she returns for follow-up for interstitial lung disease.  Since her last visit in July 2019 we had a multidisciplinary case conference in August 2019.  Despite the air trapping seen on CT scan of the chest her lung biopsy has been read out as an SIP.  There was a tendency for some multinucleated giant cells but no clear-cut granuloma.  These are few and far between.  I did a history retake for hypersensitivity pneumonitis.  She  vehemently denies any mold or mildew exposure.  She does not use any wind instruments.  She does not do any farming or guarding.  No exposure to rotten wood on mulch.  No pet hamsters or gerbils or birds.  No using feather pillows.  No misty fountain in the house.  No nebulizer use no CPAP use.  No humidifier use.  The house is only 78 years old.  Even in her prior jobs there is no mold exposure.  She is currently exercising.  Her walking desaturation test seems to have improved.    OV 02/26/2018  Subjective:  Patient ID: Alison Ayala, female , DOB: 1945-06-28 , age 32 y.o. , MRN: [id number] , ADDRESS: [address] [place] [address]  02/26/2018 -   Chief Complaint  Patient  presents with   Follow-up    Pt states she has been doing well since last visit. States she still becomes SOB with exertion.     Alison Ayala 78 y.o. -seen October 2019 for the above issues.  She is not taking any prednisone.  She does not want to do it because of side effects.  In the interim she is continued to do well.  She has mild class II dyspnea on exertion relieved by rest.  This is stable.  No new interim issues you are up-to-date with a flu shot.  She had pulmonary function test that shows improvement in FVC with slight decline in DLCO/stable DLCO.  But overall she feels good.  Walking desaturation test shows no change.       07/27/2020- Interim hx  Patient was contacted today to review PFT results. Her breath test showed normal spirometry and DLCO. No evidence of worsening pulmonary fibrosis. She has been checking her oxygen  at home and her readings are consistently >90%. Very rarely she will see her O2 drop to 89%. She has an apt with cardiology in approximately 1 week.    Observations/Objective:  - Able to speak in full sentences; no overt shortness of breath, wheezing or cough  Pulmonary function testing: 07/22/20 - FVC 2.59 (92%), FVC 2.26 (107%), ratio 87, DLCOcor 15.94 (83%)  02/26/2018-FVC 2.55 (88% predicted), ratio 85, FEV1 2.16 (99% predicted), DLCO 18.35 (75% predicted)  Assessment and Plan:  ILD: - Early NSIP, July 2019 - on surgical lung biopsy - No findings to indicate worsening ILD; CT imaging and PFTs remain stable - Due for repeat HRCT in May 2023  Acute respiratory failure: - Seems to have resolved, she has been monitoring her O2 saturation at home and reports O2 consistently remains >90% RA   Follow Up Instructions:  - FU with Dr. Geronimo in Dec 2022  OV 08/25/2021  Subjective:  Patient ID: Alison Ayala, female , DOB: 1945-12-21 , age 40 y.o. , MRN: [id number] , ADDRESS: [address] [place] [address] PCP Teresa Channel, MD Patient Care Team: Teresa Channel, MD as PCP - General (Family Medicine) Nahser, Aleene PARAS, MD as PCP - Cardiology (Cardiology)  This Provider for this visit: Treatment Team:  Attending Provider: Geronimo Amel, MD  Follow-up mediastinal mass Anteriorand PET hot = Castleman'Ayala disease,  based on PET scan, July 2019 - biopsy provine Follow-up interstitial lung disease -likely early/mind NSIP, July 2019 - on surgical lung biopsy  08/25/2021 -   Chief Complaint  Patient presents with   Follow-up    Follow up. Patient has no complaints.      Alison Ayala 78  y.o. -personally not seen in 3 years but in the interim has followed with nurse practitioner during the COVID pandemic.  She saw a nurse practitioner last year.  She continues to be stable with very minimal symptom burden.  She says when she climbs 15 steps to her attic she will get a little bit winded and she will breathe a little bit harder but nothing out of the ordinary.  1 year ago she did get cardiac stents.  There is currently no chest pain.  Rates dyspnea is mild no cough no wheezing.  She had high-resolution CT chest in May 2023.  The level of NSIP pulmonary fibrosis very small mild and stable.  She has been reassured about this.  There are no new issues.   OV 03/06/2022  Subjective:  Patient ID: Alison Ayala, female , DOB: 03/01/1945 , age 40 y.o. , MRN: [id number] , ADDRESS: [address] [place] [address] PCP Teresa Channel, MD Patient Care Team: Teresa Channel, MD as PCP - General (Family Medicine) Nahser, Aleene PARAS, MD as PCP - Cardiology (Cardiology)  This Provider for this visit: Treatment Team:  Attending Provider: Geronimo Amel, MD    03/06/2022 -   Chief Complaint  Patient presents with   Follow-up    Pt is here for follow up for ILD. Pt had spiro and dlco this visit. Heart cath done 10/05/21. Pt states that her breathing is doing well with no issues, noted.       Alison Alison Ayala 78 y.o. -returns for routine follow-up for her stable NSIP that is mild.  Last seen in July 2023.  Since then she says she is overall stable.  She says sometimes recently she was started on an antidepressant and since then the stomach pain is improved.  Overall no urgent care visits or any surgeries.  Review of the records indicate that in August 2023 she ended up in the emergency department and had chest pain that was atypical.  She had a cardiac cath that showed circumflex lesion 20%.  Not obstructive coronary artery disease.  It was felt she had noncardiac chest pain.  She states this is improved with antidepressant.  Her current symptoms are minimal.  Her pulmonary function test today that I personally reviewed is stable.  Walking desaturation test also stable.  She prefers continued observation therapy. CT Chest data - HRCT 06/15/21  Narrative & Impression  CLINICAL DATA:  Interstitial lung disease   EXAM: CT CHEST WITHOUT CONTRAST   TECHNIQUE: Multidetector CT imaging of the chest was performed following the standard protocol without intravenous contrast. High resolution imaging of the lungs, as well as inspiratory and expiratory imaging, was performed.   RADIATION DOSE REDUCTION: This exam was performed according to the departmental dose-optimization program which includes automated exposure control, adjustment of the mA and/or kV according to patient size and/or use of iterative reconstruction technique.   COMPARISON:  CT chest dated Jul 04, 2020; chest CT dated September 02, 2018   FINDINGS: Cardiovascular: Normal heart size. No pericardial effusion. Normal caliber thoracic aorta with atherosclerotic disease. Coronary artery calcifications of the LAD and LAD stent. Status post CABG.   Mediastinum/Nodes: Moderate hiatal hernia. Thyroid  is unremarkable. No pathologically enlarged lymph nodes seen in the chest.   Lungs/Pleura: Central airways are patent.  Mild basilar air trapping. Mild bilateral bronchiectasis, most pronounced in the lower lungs. Prior right lower lobe wedge resection. Left basilar atelectasis. No consolidation, pleural effusion or pneumothorax.   Upper  Abdomen: Stable low-attenuation nodule of the left adrenal gland, likely adenoma. No acute abnormality.   Musculoskeletal: Prior median sternotomy with intact sternal wires. No aggressive appearing osseous lesions. VP shunt coursing through the soft tissues of the right hemithorax.   IMPRESSION: 1. Patchy areas of ground-glass attenuation which seen on Jul 04, 2020 chest CT have resolved, and may have been due to pulmonary edema or acute exacerbation of ILD. On today'Ayala exam there is very minimal subpleural reticulation with no evidence of progression when compared with prior September 02, 2018 exam. Findings remain in keeping with very mild NSIP pattern fibrosis as demonstrated by wedge biopsy histology. 2. Aortic Atherosclerosis (ICD10-I70.0).     Electronically Signed   By: Rea Marc M.D.   On: 06/16/2021 20:29       OV 12/11/2022  Subjective:  Patient ID: Alison Ayala, female , DOB: 1945/04/09 , age 40 y.o. , MRN: [id number] , ADDRESS: [address] [place] [address] PCP Teresa Channel, MD Patient Care Team: Teresa Channel, MD as PCP - General (Family Medicine) Nahser, Aleene PARAS, MD as PCP - Cardiology (Cardiology)  This Provider for this visit: Treatment Team:  Attending Provider: Geronimo Amel, MD  12/11/2022 -   Chief Complaint  Patient presents with   Follow-up    Review PFT. Breathing is unchanged since the last visit.      Alison Alison Ayala 78 y.o. -returns for follow-up.  She states since last visit in January 2024 she continues to remain stable.  There is no worsening.  She states the only interim issues that she stopped taking one of the medications given by Dr. Albertus for nutcracker esophagus.  Otherwise she  continues to do well if she feels the dyspnea stable.  Excess hypoxemia test is stable.  She had pulmonary function test and the some fluctuation which she believes because of sinus drainage on the day of testing and not being coached properly.  She does not see a reason why she should be declining from a ILD. Interim Health status: No new complaints No new medical problems. No new surgeries. No ER visits. No Urgent care visits.   Second treatment of her vaccination status.  Is being several years since she had her pneumococcal vaccine.  I discussed this with her.  Advised her to take pneumococcal Prevnar and she is willing to do that but we are out of stock. OV 10/11/2023  Subjective:  Patient ID: Alison Ayala, female , DOB: 08/07/1945 , age 22 y.o. , MRN: [id number] , ADDRESS: [address] [place] [address] PCP Teresa Channel, MD Patient Care Team: Teresa Channel, MD as PCP - General (Family Medicine) Loni Soyla LABOR, MD as PCP - Cardiology (Cardiology)  This Provider for this visit: Treatment Team:  Attending Provider: Geronimo Amel, MD    Follow-up mediastinal mass Anteriorand PET hot = Castleman'Ayala disease,   - based on PET scan, July 2019 - biopsy provine  Follow-up interstitial lung disease  -likely early/mind NSIP, July 2019 - on surgical lung biopsy - last CT May 2023  10/11/2023 -   Chief Complaint  Patient presents with   Medical Management of Chronic Issues   Interstitial Lung Disease    Breathing is overall doing well. She has occ cough- prod with clear sputum.      Alison Alison Ayala 78 y.o. -returns for follow-up.  Since I last saw her in the interim other than some acid reflux Interim Health status: No  new complaints No new medical problems. No new surgeries. No ER visits. No Urgent care visits. No changes to medications.  She had following function test in July 2024 other than mild reduction in DLCO it is normal and overall stable.  She will  had CT scan of the chest radiologist is underwhelmed about the presence of ILD.  It is very minimal.  I personally visualized it and agree with the radiologist.    SYMPTOM SCALE - ILD 03/06/2022 12/11/2022   Current weight    O2 use ra ra  Shortness of Breath 0 -> 5 scale with 5 being worst (score 6 If unable to do)   At rest 0 0  Simple tasks - showers, clothes change, eating, shaving 0 1  Household (dishes, doing bed, laundry) 1 1  Shopping 1 1  Walking level at own pace 1 1  Walking up Stairs 1 2  Total (30-36) Dyspnea Score 4 6  How bad is your cough? 1 0  How bad is your fatigue 1 0  How bad is nausea 2 0  How bad is vomiting?  0 0  How bad is diarrhea? 0 0  How bad is anxiety? 0 0  How bad is depression 1 1  Any chronic pain - if so where and how bad x        Simple office walk 185 feet x  3 laps goal with forehead probe 07/03/2017  08/23/2017  11/26/2017  02/26/2018  08/25/2021  03/06/2022  12/11/2022   O2 used Room air Room air Room air rooom air ra ra ra  Number laps completed 3 3 3 3 3 3  Sit stand x 15  Comments about pace  normal  Good pace     Resting Pulse Ox/HR 96% and 64/min 97% and HR 87/min  96% and 73/min 100% and 66/min 96% and HR 103 97%  99% and HR 67  Final Pulse Ox/HR 92% and 88/min 92% and HR 100/min  96% and 91/min 95% and 95/min 95% and HR 126 94% 96% and HR 81  Desaturated </= 88% no0 no  no no non  no  Desaturated <= 3% points yes yes  no no no  YES  Got Tachycardic >/= 90/min no yes  yes yes no  no  Symptoms at end of test Dyspnea reproduced Mild fatigue only  No complaints Mild dyspnea  No dyspnea  Miscellaneous comments Normal pace slow stable  improved         CT Chest data from date: may 2025  - personally visualized and independently interpreted : yes - my findings are: as below   IMPRESSION: 1. Trace scattered subpleural reticular densities and ground-glass with mild cylindrical/varicose bronchiectasis. Patient reportedly has a  biopsy-proven diagnosis of fibrotic nonspecific interstitial pneumonitis. 2. Linear 3 mm superior segment left lower lobe nodule, not seen on 06/15/2021 but likely benign. No follow-up needed if patient is low-risk.This recommendation follows the consensus statement: Guidelines for Management of Incidental Pulmonary Nodules Detected on CT Images: From the Fleischner Society 2017; Radiology 2017; 284:228-243. 3. Left adrenal adenoma. 4. Small to moderate hiatal hernia. 5. Aortic atherosclerosis (ICD10-I70.0). Coronary artery calcification. 6. Enlarged pulmonic trunk, indicative of pulmonary arterial hypertension.     Electronically Signed   By: Newell Eke M.D.   On: 06/15/2023 09:11 PFT     Latest Ref Rng & Units 08/30/2023    3:44 PM 11/24/2022    9:53 AM 03/06/2022    9:53 AM 07/22/2020  2:36 PM 10/08/2018    1:52 PM 02/26/2018    1:48 PM 07/11/2017    9:49 AM  PFT Results  FVC-Pre L 2.71  2.56  2.63  2.59  2.74  2.55  2.14   FVC-Predicted Pre % 105  97  100  92  93  88  73   FVC-Post L    2.59      FVC-Predicted Post %    92      Pre FEV1/FVC % % 81  85  84  86  87  85  85   Post FEV1/FCV % %    87      FEV1-Pre L 2.18  2.18  2.21  2.23  2.39  2.16  1.83   FEV1-Predicted Pre % 113  111  113  105  107  99  83   FEV1-Post L    2.26      DLCO uncorrected ml/min/mmHg 13.99  14.07  16.74  14.56  17.78  18.35  19.55   DLCO UNC% % 76  76  91  76  90  75  80   DLCO corrected ml/min/mmHg  14.07  16.74  15.94      DLCO COR %Predicted %  76  91  83      DLVA Predicted % 89  84  100  90  111  97  108   TLC L    4.55      TLC % Predicted %    90      RV % Predicted %    71         IMPRESSIONS     1. Left ventricular ejection fraction, by estimation, is 60 to 65%. The  left ventricle has normal function. The left ventricle has no regional  wall motion abnormalities. There is mild asymmetric left ventricular  hypertrophy of the basal and septal  segments. Left ventricular  diastolic parameters are consistent with Grade  I diastolic dysfunction (impaired relaxation). The average left  ventricular global longitudinal strain is -23.5 %. The global longitudinal  strain is normal.   2. Right ventricular systolic function is normal. The right ventricular  size is normal. There is normal pulmonary artery systolic pressure.   3. Left atrial size was mildly dilated.   4. The mitral valve is abnormal. Mild to moderate mitral valve  regurgitation. No evidence of mitral stenosis.   5. Tricuspid valve regurgitation is moderate.   6. The aortic valve is tricuspid. There is mild calcification of the  aortic valve. There is mild thickening of the aortic valve. Aortic valve  regurgitation is mild to moderate. Aortic valve sclerosis is present, with  no evidence of aortic valve  stenosis.   7. The inferior vena cava is normal in size with greater than 50%  respiratory variability, suggesting right atrial pressure of 3 mmHg.    LAB RESULTS last 96 hours No results found.       has a past medical history of CAD (coronary artery disease), Carpal tunnel syndrome of right wrist, Castleman disease (HCC), Diverticulosis, GERD (gastroesophageal reflux disease), H/O hiatal hernia, H/O intraventricular hemorrhage (2004--  SPONTANEOUS), Heart murmur, Hiatal hernia, Hip pain, right (OCCASIONAL -- PINCHED NERVE), History of gastric ulcer (2001), Hyperlipidemia, Hypertension, Hypoglycemia, Internal hemorrhoids, Interstitial lung disease (HCC), Jackhammer esophagus, Mediastinal mass, Mitral valve prolapse, Normal echocardiogram (30 YRS AGO), NSIP (nonspecific interstitial pneumonia) (HCC), Ayala/P ventriculoperitoneal shunt (2004--- PER PT SHUNT IS WORKING WELL --  NO ISSUES), SAH (subarachnoid hemorrhage) (HCC), Squamous cell cancer of scalp and skin of neck, and Tubular adenoma of colon.   reports that she has never smoked. She has never used smokeless tobacco.  Past Surgical History:   Procedure Laterality Date   BILATERAL FRONTAL INTERVENTRICULAR CATHETER VIA TWIST DRILL HOLE  AUG 2004   SPONTANEOUS SUBARACHNOID  HEMORRHAGE AND  INTRAVENTRICULAR HEMORRHAGE   BIOPSY  06/16/2021   Procedure: BIOPSY;  Surgeon: Albertus Gordy HERO, MD;  Location: [facility];  Service: Gastroenterology;;   CARPAL TUNNEL RELEASE  07/26/2011   Procedure: CARPAL TUNNEL RELEASE;  Surgeon: Lynwood SHAUNNA Bern, MD;  Location: [facility];  Service: Orthopedics;  Laterality: Right;   CATARACT EXTRACTION W/ INTRAOCULAR LENS  IMPLANT, BILATERAL  2012   BILATERAL   COLONOSCOPY WITH PROPOFOL  N/A 06/16/2021   Procedure: COLONOSCOPY WITH PROPOFOL ;  Surgeon: Albertus Gordy HERO, MD;  Location: [facility];  Service: Gastroenterology;  Laterality: N/A;   CORONARY IMAGING/OCT N/A 04/08/2020   Procedure: INTRAVASCULAR IMAGING/OCT;  Surgeon: Verlin Lonni BIRCH, MD;  Location: [facility];  Service: Cardiovascular;  Laterality: N/A;   CORONARY STENT INTERVENTION N/A 04/08/2020   Procedure: CORONARY STENT INTERVENTION;  Surgeon: Verlin Lonni BIRCH, MD;  [facility]: [facility];  Service: Cardiovascular;  Laterality: N/A;   DUPUYTREN CONTRACTURE RELEASE  07/26/2011   Procedure: DUPUYTREN CONTRACTURE RELEASE;  Surgeon: Lynwood SHAUNNA Bern, MD;  Location:  SURGERY CENTER;  Service: Orthopedics;  Laterality: Right;  EXCISION OF DUPYTRENS BAND THUMB- INDEX WEB SPACE   ESOPHAGEAL MANOMETRY N/A 01/27/2019   Procedure: ESOPHAGEAL MANOMETRY (EM);  Surgeon: Albertus Gordy HERO, MD;  Location: [facility];  Service: Gastroenterology;  Laterality: N/A;   ESOPHAGOGASTRODUODENOSCOPY N/A 06/16/2021   Procedure: ESOPHAGOGASTRODUODENOSCOPY (EGD);  Surgeon: Albertus Gordy HERO, MD;  Location: [facility];  Service: Gastroenterology;  Laterality: N/A;   EYE SURGERY Bilateral    scrapped corneas   LEFT HEART CATH AND CORONARY ANGIOGRAPHY N/A 04/08/2020   Procedure: LEFT HEART CATH AND CORONARY ANGIOGRAPHY;  Surgeon:  Verlin Lonni BIRCH, MD;  [facility];  Service: Cardiovascular;  Laterality: N/A;   LEFT HEART CATH AND CORONARY ANGIOGRAPHY N/A 10/05/2021   Procedure: LEFT HEART CATH AND CORONARY ANGIOGRAPHY;  Surgeon: Darron Deatrice LABOR, MD;  [facility];  Service: Cardiovascular;  Laterality: N/A;   LUNG BIOPSY Right 08/07/2017   Procedure: Open LUNG BIOPSY;  Surgeon: Fleeta Hanford Coy, MD;  Location[facility] OR;  Service: Thoracic;  Laterality: Right;   RESECTION OF MEDIASTINAL MASS N/A 08/07/2017   Procedure: RESECTION OF MEDIASTINAL MASS;  Surgeon: Fleeta Hanford Coy, MD;  Location: [facility] OR;  Service: Thoracic;  Laterality: N/A;   RETINAL DETACHMENT REPAIR W/ SCLERAL BUCKLE LE  03-10-2011   LEFT EYE   STERNOTOMY N/A 08/07/2017   Procedure: STERNOTOMY;  Surgeon: Fleeta Hanford Coy, MD;  Location: [facility] OR;  Service: Thoracic;  Laterality: N/A;   VENTRICULOPERITONEAL SHUNT  10-08-2002   RIGHT FRONTAL FOR COMMUNICATING HYDROCEPHALUS    Allergies  Allergen Reactions   Brilinta  [Ticagrelor ] Shortness Of Breath   Coreg [Carvedilol] Palpitations and Other (See Comments)    Abdominal bloating   Claritin [Loratadine] Other (See Comments)    Blurred vision Dry eyes   Lipitor [Atorvastatin] Other (See Comments)    Fatigue    Lisinopril Other (See Comments)    drunk feeling    Immunization History  Administered Date(Ayala) Administered   Fluad Quad(high Dose 65+) 10/08/2018   Fluzone  Influenza virus  vaccine,trivalent (IIV3), split virus 10/16/2018   INFLUENZA, HIGH DOSE SEASONAL PF 10/07/2016, 11/06/2017, 10/15/2020   Influenza-Unspecified 01/13/2011, 12/11/2011, 10/03/2012, 10/07/2013, 10/13/2014, 10/14/2015, 10/24/2016, 11/06/2017, 01/05/2020, 10/08/2022   PFIZER(Purple Top)SARS-COV-2 Vaccination 02/26/2019, 03/17/2019, 12/19/2019   PNEUMOCOCCAL CONJUGATE-20 12/19/2022   Pfizer(Comirnaty)Fall Seasonal Vaccine 12 years and older 10/25/2022   Pneumococcal Conjugate-13 04/04/2013    Pneumococcal Polysaccharide-23 07/18/2010   Td 10/14/2015   Tdap 05/31/2005   Zoster Recombinant(Shingrix) 11/06/2017, 12/25/2018   Zoster, Live 01/21/2008, 11/06/2017, 12/25/2018    Family History  Problem Relation Age of Onset   Colon cancer Mother 60   Colon polyps Mother    Stomach cancer Maternal Aunt    Breast cancer Maternal Aunt    Uterine cancer Maternal Aunt    Rectal cancer Neg Hx    Esophageal cancer Neg Hx      Current Outpatient Medications:    acetaminophen  (TYLENOL ) 500 MG tablet, Take 1-2 tablets (500-1,000 mg total) by mouth 2 (two) times daily as needed for moderate pain or headache., Disp: 30 tablet, Rfl: 0   buPROPion  (WELLBUTRIN  XL) 300 MG 24 hr tablet, Take 300 mg by mouth every morning., Disp: , Rfl:    carboxymethylcellulose (REFRESH PLUS) 0.5 % SOLN, Place 1 drop into both eyes in the morning., Disp: , Rfl:    Cholecalciferol (D3 2000 PO), Take 2,000 mcg by mouth daily., Disp: , Rfl:    clopidogrel  (PLAVIX ) 75 MG tablet, TAKE 1 TABLET BY MOUTH EVERY DAY, Disp: 90 tablet, Rfl: 3   metoprolol  succinate (TOPROL -XL) 100 MG 24 hr tablet, Take 1 tablet (100 mg total) by mouth daily. Take with or immediately following a meal., Disp: 90 tablet, Rfl: 3   nitroGLYCERIN  (NITROSTAT ) 0.4 MG SL tablet, DISSOLVE 1 TABLET UNDER THE TONGUE ONCE FOR EPIGASTRIC PAIN/CHEST PAIN NOT RELIEVED BY PEPPERMINT ALTOIDS. IF THIS DOES NOT RESOLVE PAIN, CALL DR!, Disp: 75 tablet, Rfl: 2   ondansetron  (ZOFRAN -ODT) 4 MG disintegrating tablet, TAKE 1 TABLET BY MOUTH EVERY 8 HOURS AS NEEDED FOR NAUSEA AND VOMITING, Disp: 30 tablet, Rfl: 0   rosuvastatin  (CRESTOR ) 20 MG tablet, TAKE 1 TABLET BY MOUTH EVERY DAY, Disp: 90 tablet, Rfl: 1   spironolactone  (ALDACTONE ) 25 MG tablet, Take 1 tablet (25 mg total) by mouth daily., Disp: 90 tablet, Rfl: 1   valsartan  (DIOVAN ) 320 MG tablet, Take 1 tablet (320 mg total) by mouth daily., Disp: 90 tablet, Rfl: 3      Objective:   Vitals:   10/11/23  1107  BP: 134/66  Pulse: 63  SpO2: 96%  Weight: 203 lb (92.1 kg)  Height: 5' 3 (1.6 m)    Estimated body mass index is 35.96 kg/m as calculated from the following:   Height as of this encounter: 5' 3 (1.6 m).   Weight as of this encounter: 203 lb (92.1 kg).  @WEIGHTCHANGE @  [employer]   10/11/23 1107  Weight: 203 lb (92.1 kg)     Physical Exam   General: No distress. Looks well O2 at rest: no Cane present: no Sitting in wheel chair: no Frail: no Obese: no Neuro: Alert and Oriented x 3. GCS 15. Speech normal Psych: Pleasant Resp:  Barrel Chest - no.  Wheeze - no, Crackles - no, No overt respiratory distress CVS: Normal heart sounds. Murmurs - no Ext: Stigmata of Connective Tissue Disease - no HEENT: Normal upper airway. PEERL +. No post nasal drip        Assessment/     Assessment & Plan NSIP (nonspecific  interstitial pneumonia) [facility])  Vaccine counseling    PLAN Patient Instructions     ICD-10-CM   1. NSIP (nonspecific interstitial pneumonia) (HCC)  J84.89         Very Mild INtersttial abnormalities are stable over years  Plan - continue to monitor off prednisone  -12 months do spiro and dlco - if lungs ever get worse, a anti fibrotics ar an option  Vaccine counseling  FLu shot in fall 10/11/2023    Followup 12 months but after spiro/dlco to see APP    FOLLOWUP    Return in about 1 year (around 10/10/2024) for with any of the APPS, Face to Face Visit.    SIGNATURE    Dr. Dorethia Cave, M.D., F.C.C.P,  Pulmonary and Critical Care Medicine Staff Physician, [facility] Center Director - Interstitial Lung Disease  Program  Pulmonary Fibrosis [facility] at [facility] [place], [place], [address]  Pager: [phone], If no answer or between  15:00h - 7:00h: call [phone] Telephone: [phone]  5:58 PM 10/11/2023

## 2023-10-11 ENCOUNTER — Ambulatory Visit: Admitting: Internal Medicine

## 2023-10-11 ENCOUNTER — Encounter: Payer: Self-pay | Admitting: Internal Medicine

## 2023-10-11 VITALS — BP 134/66 | HR 63 | Ht 63.0 in | Wt 203.0 lb

## 2023-10-11 DIAGNOSIS — J8489 Other specified interstitial pulmonary diseases: Secondary | ICD-10-CM

## 2023-10-11 DIAGNOSIS — Z7185 Encounter for immunization safety counseling: Secondary | ICD-10-CM

## 2023-11-12 DIAGNOSIS — H53483 Generalized contraction of visual field, bilateral: Secondary | ICD-10-CM | POA: Diagnosis not present

## 2023-11-19 ENCOUNTER — Telehealth: Payer: Self-pay | Admitting: Internal Medicine

## 2023-11-19 MED ORDER — CLOPIDOGREL BISULFATE 75 MG PO TABS: 75.0000 mg | ORAL_TABLET | Freq: Every day | ORAL | 2 refills | Status: AC

## 2023-11-19 NOTE — Telephone Encounter (Signed)
 Pt's medication was sent to pt's pharmacy as requested. Confirmation received.

## 2023-11-19 NOTE — Telephone Encounter (Signed)
*  STAT* If patient is at the pharmacy, call can be transferred to refill team.   1. Which medications need to be refilled? (please list name of each medication and dose if known)   clopidogrel  (PLAVIX ) 75 MG tablet    2. Which pharmacy/location (including street and city if local pharmacy) is medication to be sent to? [id number] [place], [place] - [address] Phone: [phone]  Fax: [phone]      3. Do they need a 30 day or 90 day supply? 90 Pt is out of medication

## 2024-01-08 ENCOUNTER — Telehealth (HOSPITAL_BASED_OUTPATIENT_CLINIC_OR_DEPARTMENT_OTHER): Payer: Self-pay | Admitting: *Deleted

## 2024-01-08 ENCOUNTER — Telehealth (HOSPITAL_BASED_OUTPATIENT_CLINIC_OR_DEPARTMENT_OTHER): Payer: Self-pay

## 2024-01-08 NOTE — Telephone Encounter (Signed)
 Pt returning call. Please advise.

## 2024-01-08 NOTE — Telephone Encounter (Signed)
Left message to call back and schedule tele pre op appt

## 2024-01-08 NOTE — Telephone Encounter (Signed)
 S/w the pt and she has been scheduled tele preop appt 01/14/24. Med rec and consent are done.

## 2024-01-08 NOTE — Telephone Encounter (Signed)
   Name: Alison Ayala  DOB: [birth year]  MRN: [id number]  Primary Cardiologist: Soyla DELENA Merck, MD   Preoperative team, please contact this patient and set up a phone call appointment for further preoperative risk assessment. Please obtain consent and complete medication review. Thank you for your help.  I confirm that guidance regarding antiplatelet and oral anticoagulation therapy has been completed and, if necessary, noted below.  Per office protocol, if patient is without any new symptoms or concerns at the time of their virtual visit, he/she may hold Plavix  for 5 days prior to procedure (pt was previously cleared by MD to hold Plavix ). Please resume Plavix  as soon as possible postprocedure, at the discretion of the surgeon.   I also confirmed the patient resides in the state of [place] . As per [facility] Medical Board telemedicine laws, the patient must reside in the state in which the provider is licensed.   Damien JAYSON Braver, NP 01/08/2024, 11:23 AM [facility]

## 2024-01-08 NOTE — Telephone Encounter (Signed)
   Pre-operative Risk Assessment    Patient Name: Alison Ayala  DOB: 1945-04-16 MRN: [id number]   Date of last office visit: 08/21/2023 with Damien Braver, NP   Date of next office visit: 02/18/2024 with Dr. Loni  Request for Surgical Clearance    Procedure:  Right direct brow ptosis repair  Date of Surgery:  Clearance 01/21/24                                 Surgeon:  Dr. Renzo Zaldivar Surgeon's Group or Practice Name:  [facility]  Phone number:  [phone] Fax number:  [phone]   Type of Clearance Requested:   - Medical  - Pharmacy:  Hold Clopidogrel  (Plavix ) -Does not specify   Type of Anesthesia:  Does not specify   Additional requests/questions:  None  SignedPatrcia Iverson CROME   01/08/2024, 9:25 AM

## 2024-01-08 NOTE — Telephone Encounter (Signed)
 S/w the pt and she has been scheduled tele preop appt 01/14/24. Med rec and consent are done.       Patient Consent for Virtual Visit        Alison Ayala has provided verbal consent on 01/08/2024 for a virtual visit (video or telephone).   CONSENT FOR VIRTUAL VISIT FOR:  Alison Ayala  By participating in this virtual visit I agree to the following:  I hereby voluntarily request, consent and authorize [facility] and its employed or contracted physicians, physician assistants, nurse practitioners or other licensed health care professionals (the Practitioner), to provide me with telemedicine health care services (the "Services) as deemed necessary by the treating Practitioner. I acknowledge and consent to receive the Services by the Practitioner via telemedicine. I understand that the telemedicine visit will involve communicating with the Practitioner through live audiovisual communication technology and the disclosure of certain medical information by electronic transmission. I acknowledge that I have been given the opportunity to request an in-person assessment or other available alternative prior to the telemedicine visit and am voluntarily participating in the telemedicine visit.  I understand that I have the right to withhold or withdraw my consent to the use of telemedicine in the course of my care at any time, without affecting my right to future care or treatment, and that the Practitioner or I may terminate the telemedicine visit at any time. I understand that I have the right to inspect all information obtained and/or recorded in the course of the telemedicine visit and may receive copies of available information for a reasonable fee.  I understand that some of the potential risks of receiving the Services via telemedicine include:  Delay or interruption in medical evaluation due to technological equipment failure or disruption; Information transmitted may not be  sufficient (e.g. poor resolution of images) to allow for appropriate medical decision making by the Practitioner; and/or  In rare instances, security protocols could fail, causing a breach of personal health information.  Furthermore, I acknowledge that it is my responsibility to provide information about my medical history, conditions and care that is complete and accurate to the best of my ability. I acknowledge that Practitioner's advice, recommendations, and/or decision may be based on factors not within their control, such as incomplete or inaccurate data provided by me or distortions of diagnostic images or specimens that may result from electronic transmissions. I understand that the practice of medicine is not an exact science and that Practitioner makes no warranties or guarantees regarding treatment outcomes. I acknowledge that a copy of this consent can be made available to me via my patient portal [facility] MyChart), or I can request a printed copy by calling the office of [facility].    I understand that my insurance will be billed for this visit.   I have read or had this consent read to me. I understand the contents of this consent, which adequately explains the benefits and risks of the Services being provided via telemedicine.  I have been provided ample opportunity to ask questions regarding this consent and the Services and have had my questions answered to my satisfaction. I give my informed consent for the services to be provided through the use of telemedicine in my medical care

## 2024-01-09 ENCOUNTER — Encounter (HOSPITAL_COMMUNITY): Payer: Self-pay

## 2024-01-09 ENCOUNTER — Emergency Department (HOSPITAL_COMMUNITY)
Admission: EM | Admit: 2024-01-09 | Discharge: 2024-01-09 | Disposition: A | Discharge status: Home / Self Care | Attending: Emergency Medicine | Admitting: Emergency Medicine

## 2024-01-09 ENCOUNTER — Ambulatory Visit (HOSPITAL_COMMUNITY): Admission: EM | Admit: 2024-01-09 | Discharge: 2024-01-09 | Discharge status: Against Medical Advice

## 2024-01-09 ENCOUNTER — Other Ambulatory Visit: Payer: Self-pay

## 2024-01-09 DIAGNOSIS — I509 Heart failure, unspecified: Secondary | ICD-10-CM | POA: Insufficient documentation

## 2024-01-09 DIAGNOSIS — I2510 Atherosclerotic heart disease of native coronary artery without angina pectoris: Secondary | ICD-10-CM | POA: Insufficient documentation

## 2024-01-09 DIAGNOSIS — I110 Hypertensive heart disease with heart failure: Secondary | ICD-10-CM | POA: Diagnosis not present

## 2024-01-09 DIAGNOSIS — R067 Sneezing: Secondary | ICD-10-CM | POA: Diagnosis not present

## 2024-01-09 DIAGNOSIS — R040 Epistaxis: Secondary | ICD-10-CM | POA: Insufficient documentation

## 2024-01-09 DIAGNOSIS — Z7902 Long term (current) use of antithrombotics/antiplatelets: Secondary | ICD-10-CM | POA: Diagnosis not present

## 2024-01-09 DIAGNOSIS — R0981 Nasal congestion: Secondary | ICD-10-CM | POA: Insufficient documentation

## 2024-01-09 DIAGNOSIS — Z79899 Other long term (current) drug therapy: Secondary | ICD-10-CM | POA: Diagnosis not present

## 2024-01-09 LAB — CBC
HCT: 40.7 % (ref 36.0–46.0)
Hemoglobin: 13.5 g/dL (ref 12.0–15.0)
MCH: 31.6 pg (ref 26.0–34.0)
MCHC: 33.2 g/dL (ref 30.0–36.0)
MCV: 95.3 fL (ref 80.0–100.0)
Platelets: 258 K/uL (ref 150–400)
RBC: 4.27 MIL/uL (ref 3.87–5.11)
RDW: 13.7 % (ref 11.5–15.5)
WBC: 7 K/uL (ref 4.0–10.5)
nRBC: 0 % (ref 0.0–0.2)

## 2024-01-09 LAB — PROTIME-INR
INR: 1 (ref 0.8–1.2)
Prothrombin Time: 14.2 s (ref 11.4–15.2)

## 2024-01-09 MED ORDER — OXYMETAZOLINE HCL 0.05 % NA SOLN
2.0000 | Freq: Once | NASAL | Status: AC
  Administered 2024-01-09: 2 via NASAL
  Filled 2024-01-09: qty 30

## 2024-01-09 NOTE — ED Notes (Signed)
 Pt came out of room stating that since her nose wasn't bleeding anymore she was going to leave.

## 2024-01-09 NOTE — ED Provider Notes (Signed)
 [place] EMERGENCY DEPARTMENT AT [facility] Provider Note   CSN: [id number] Arrival date & time: 01/09/24  1528     Patient presents with: Epistaxis   Alison Ayala is a 78 y.o. female.   The history is provided by medical records, the patient and a significant other. No language interpreter was used.  Epistaxis Location:  L nare Severity:  Moderate Duration:  3 hours Timing:  Intermittent Progression:  Waxing and waning Chronicity:  New Context: weather change   Context comment:  Chronic smoldering URI sx Relieved by:  Nothing Worsened by:  Nothing Ineffective treatments:  None tried Associated symptoms: blood in oropharynx, congestion and sneezing   Associated symptoms: no facial pain, no fever and no headaches        Prior to Admission medications   Medication Sig Start Date End Date Taking? Authorizing Provider  acetaminophen  (TYLENOL ) 500 MG tablet Take 1-2 tablets (500-1,000 mg total) by mouth 2 (two) times daily as needed for moderate pain or headache. 08/10/17   Dwan Kyla HERO, PA-C  buPROPion  (WELLBUTRIN  XL) 300 MG 24 hr tablet Take 300 mg by mouth every morning. 12/23/21   [provider]  carboxymethylcellulose (REFRESH PLUS) 0.5 % SOLN Place 1 drop into both eyes in the morning.    [provider]  Cholecalciferol (D3 2000 PO) Take 2,000 mcg by mouth daily.    [provider]  clopidogrel  (PLAVIX ) 75 MG tablet Take 1 tablet (75 mg total) by mouth daily. 11/19/23   Daneen Damien BROCKS, NP  metoprolol  succinate (TOPROL -XL) 100 MG 24 hr tablet Take 1 tablet (100 mg total) by mouth daily. Take with or immediately following a meal. 08/01/21   Nahser, Aleene PARAS, MD  nitroGLYCERIN  (NITROSTAT ) 0.4 MG SL tablet DISSOLVE 1 TABLET UNDER THE TONGUE ONCE FOR EPIGASTRIC PAIN/CHEST PAIN NOT RELIEVED BY PEPPERMINT ALTOIDS. IF THIS DOES NOT RESOLVE PAIN, CALL DR! 11/02/22   Percy Rosaline HERO, NP  ondansetron  (ZOFRAN -ODT) 4 MG  disintegrating tablet TAKE 1 TABLET BY MOUTH EVERY 8 HOURS AS NEEDED FOR NAUSEA AND VOMITING 12/22/22   Pyrtle, Gordy HERO, MD  rosuvastatin  (CRESTOR ) 20 MG tablet TAKE 1 TABLET BY MOUTH EVERY DAY 08/09/23   Acharya, Gayatri A, MD  spironolactone  (ALDACTONE ) 25 MG tablet Take 1 tablet (25 mg total) by mouth daily. 07/30/23   Acharya, Gayatri A, MD  valsartan  (DIOVAN ) 320 MG tablet Take 1 tablet (320 mg total) by mouth daily. 05/03/20   Lelon Glendia DASEN, PA-C    Allergies: Brilinta  [ticagrelor ], Coreg [carvedilol], Claritin [loratadine], Lipitor [atorvastatin], and Lisinopril    Review of Systems  Constitutional:  Negative for chills, fatigue and fever.  HENT:  Positive for congestion, nosebleeds and sneezing.   Respiratory:  Negative for chest tightness, shortness of breath and wheezing.   Cardiovascular:  Negative for chest pain.  Gastrointestinal:  Negative for abdominal pain, constipation, diarrhea, nausea and vomiting.  Genitourinary:  Negative for dysuria.  Musculoskeletal:  Negative for back pain and neck pain.  Skin:  Negative for rash and wound.  Neurological:  Negative for weakness, light-headedness, numbness and headaches.  All other systems reviewed and are negative.   Updated Vital Signs BP (!) 164/65 (BP Location: Right Arm)   Pulse 71   Temp (!) 97.4 F (36.3 C)   Resp 16   Ht 5' 4 (1.626 m)   Wt 90.7 kg   SpO2 98%   BMI 34.33 kg/m   Physical Exam Vitals and nursing note reviewed.  Constitutional:      General: She is not in acute distress.    Appearance: She is well-developed. She is not ill-appearing, toxic-appearing or diaphoretic.  HENT:     Head: Atraumatic.     Nose: No nasal deformity, septal deviation, signs of injury, laceration, nasal tenderness, congestion or rhinorrhea.     Right Nostril: No epistaxis.     Left Nostril: Epistaxis present. No septal hematoma or occlusion.     Right Turbinates: Swollen.     Left Turbinates: Swollen.     Comments: Mild  epistaxis seen in left nare but no large clot as patient just blew it out before arrival.  Some mild oozing in her oropharynx with blood but no significant bleeding seen.  Dried blood at the tip of the left nare.    Mouth/Throat:     Mouth: Mucous membranes are moist.     Pharynx: No oropharyngeal exudate or posterior oropharyngeal erythema.  Eyes:     Extraocular Movements: Extraocular movements intact.     Conjunctiva/sclera: Conjunctivae normal.     Pupils: Pupils are equal, round, and reactive to light.  Cardiovascular:     Rate and Rhythm: Normal rate and regular rhythm.     Heart sounds: No murmur heard. Pulmonary:     Effort: Pulmonary effort is normal. No respiratory distress.     Breath sounds: Normal breath sounds.  Abdominal:     Palpations: Abdomen is soft.     Tenderness: There is no abdominal tenderness.  Musculoskeletal:        General: No swelling or tenderness.     Cervical back: Neck supple. No tenderness.  Skin:    General: Skin is warm and dry.     Capillary Refill: Capillary refill takes less than 2 seconds.  Neurological:     Mental Status: She is alert.     (all labs ordered are listed, but only abnormal results are displayed) Labs Reviewed  PROTIME-INR  CBC    EKG: None  Radiology: No results found.   Procedures   Medications Ordered in the ED  oxymetazoline (AFRIN) 0.05 % nasal spray 2 spray (2 sprays Left Nare Given 01/09/24 1747)                                    Medical Decision Making Amount and/or Complexity of Data Reviewed Labs: ordered.  Risk OTC drugs.    Alison Ayala is a 78 y.o. female with a past medical history significant for hypertension, interstitial lung disease, CAD, GERD, CHF, previous subarachnoid hemorrhage, and hemorrhoids who is on Plavix  who presents for epistaxis.  According to patient, she started a nosebleed today about 12:30 PM and went to urgent care and left to come here.  She reports that it bled  for quite a while and then stopped and then bled again after leaving urgent care so she presents here.  She reports it is not bleeding upon arrival.  She tells me that she has had no head trauma but has had some URI symptoms chronically.  She does agree that the temperature and environmental changes may have contributed.  She does not want to have significant nosebleeds in the past.  Otherwise she denies any chest pain, shortness breath, lightheadedness or.  She denies any other symptoms at this time.  On exam, lungs clear.  Chest nontender.  Abdomen nontender.  Patient has some very faint oozing  blood in the back of her throat but no active epistaxis out of her naris.  She reports it was only out of her left nare and down the back of her throat initially.  She denies any headache or facial trauma.  Exam otherwise unremarkable and I did not see any anterior bleeding to cauterize.  She had some blood work in triage that has returned reassuring without significant thrombocytopenia and no significant anemia.  Labs reassuring.  Vital signs showed some hypertension but otherwise no tachycardia or hypoxia.  We had a shared decision-making conversation and agreed to do 2 sprays of Afrin to see if that would constrict any oozing that is still ongoing but due to her lack of large clot or other active bleeding we will hold on further intervention or placement of a balloon at this time.  Patient tolerated the Afrin well and if there is no further epistaxis and she improved some stability, we will plan for discharge home to have her follow-up with outpatient ENT with close follow-up.  5:49 PM Patient reassessed and there is no further oozing down the back of her throat.  She is feeling well and is proving stability.  She would like to go home.  If she passes a p.o. challenge, will discharge home after her labs were reassuring.       Final diagnoses:  Left-sided epistaxis    ED Discharge Orders     None        Clinical Impression: 1. Left-sided epistaxis     Disposition: Discharge  Condition: Good  I have discussed the results, Dx and Tx plan with the pt(& family if present). He/she/they expressed understanding and agree(s) with the plan. Discharge instructions discussed at great length. Strict return precautions discussed and pt &/or family have verbalized understanding of the instructions. No further questions at time of discharge.    New Prescriptions   No medications on file    Follow Up: Anice Penne HAS [address] [place] [address] [phone]   with ENT      Florian Chauca, Lonni PARAS, MD 01/09/24 [phone]

## 2024-01-09 NOTE — ED Triage Notes (Signed)
 Patient states her nose started bleeding about 45 min ago. She has not been able to make it stop the bleeding.

## 2024-01-09 NOTE — ED Triage Notes (Signed)
 Quick triage note. Pt to ED from UC C/O epistaxis that started around 1230pm Bleeding is currently controlled, but pt has a rag that is saturated in blood, reports bleeding has been intermittent since.   Pt takes blood thinner Plavix 

## 2024-01-09 NOTE — ED Notes (Signed)
 Extra LG tube drawn

## 2024-01-09 NOTE — Discharge Instructions (Signed)
 Your history, exam, and evaluation are consistent with recurrent nosebleed today likely due to both your chronic congestion and URI symptoms as well as the environmental changes recently.  You are able to stop the bleeding primarily on your own and we also use some Afrin to stop some further oozing.  There was no further bleeding during your period of observation and your labs were reassuring.  We feel you are now safe for discharge home.  Please follow-up with ENT and return if symptoms change or worsen acutely.  Please rest and stay hydrated and avoid nose blowing and going outside and exerting yourself or any significant temperature fluctuations for the next few days.

## 2024-01-09 NOTE — ED Notes (Signed)
 Patient discharged by RN. Patient verbalizes understanding of instructions. With family to car.

## 2024-01-09 NOTE — ED Triage Notes (Signed)
 Patient nose is not actively bleeding at this time.

## 2024-01-14 ENCOUNTER — Ambulatory Visit (INDEPENDENT_AMBULATORY_CARE_PROVIDER_SITE_OTHER)

## 2024-01-14 ENCOUNTER — Ambulatory Visit: Attending: Cardiology

## 2024-01-14 ENCOUNTER — Encounter (INDEPENDENT_AMBULATORY_CARE_PROVIDER_SITE_OTHER): Payer: Self-pay

## 2024-01-14 VITALS — BP 137/69 | HR 68 | Temp 98.4°F | Ht 64.0 in | Wt 200.0 lb

## 2024-01-14 DIAGNOSIS — Z7901 Long term (current) use of anticoagulants: Secondary | ICD-10-CM | POA: Diagnosis not present

## 2024-01-14 DIAGNOSIS — Z01810 Encounter for preprocedural cardiovascular examination: Secondary | ICD-10-CM

## 2024-01-14 DIAGNOSIS — H02831 Dermatochalasis of right upper eyelid: Secondary | ICD-10-CM

## 2024-01-14 DIAGNOSIS — R040 Epistaxis: Secondary | ICD-10-CM | POA: Diagnosis not present

## 2024-01-14 DIAGNOSIS — H9313 Tinnitus, bilateral: Secondary | ICD-10-CM | POA: Diagnosis not present

## 2024-01-14 NOTE — Patient Instructions (Signed)
 Epistaxis nosebleed is common and usually not dangerous. Most nosebleeds can be managed at home but some may need medical attention.      **How to stop an active nosebleed**   - Sit up and lean forward slightly. This keeps blood from going down the throat, which can cause nausea or vomiting.   - Pinch the soft part of the nose just below the bony bridge firmly for 15 to 20 minutes without letting go. Breathe through the mouth and spit out any blood.   - A nasal clamp (nose clip) can be used to apply pressure to the lower third of the nose. Finger pressure is usually enough.   - For active bleeding, use Afrin (oxymetazoline ) nasal spray in the bleeding nostril before applying pressure.   - If bleeding does not stop after 20 minutes, or if the bleeding is heavy, seek medical care.      **If nasal tampons or packing were placed**   - Nonresorbable packing (not dissolvable) must be removed by a healthcare provider after 48 to 72 hours.   - Resorbable packing (dissolvable) may need follow-up within 1 week to check for any remaining material.   - Keep the nose and packing moist with frequent nasal saline spray to help healing and reduce crusting.   - Do not blow the nose, pick at the packing, or try to remove it yourself.   - If packing is secured with a clip at the nostril, be careful to avoid pressure sores on the skin.      **How to prevent future nosebleeds**   - Do not pick the nose or put tissues in the nose except for gentle dabbing.   - Avoid blowing the nose forcefully for at least 1 week after a nosebleed or packing.   - Use nasal saline spray every 4 to 6 hours as needed to keep the inside of the nose moist.   - Apply over-the-counter nasal saline gel (such as Ayr Gel) to the nostrils (use a pea-sized amount on a pinkie finger and put it on the nostril, not the middle part of the septum), then gently pinch the nostril. Do this at least 3 times per day, but it can be done more often.   -  Use a humidifier in the bedroom at night to add moisture to the air.   - Maintain tight blood pressure control, as high blood pressure increases the risk of nosebleeds.   - If taking blood thinners or aspirin , discuss with the healthcare provider whether these should be adjusted.      **Other tips**   - Use acetaminophen (Tylenol) for pain. Avoid aspirin  and ibuprofen unless advised by a healthcare provider, as they can increase bleeding risk.   - If sneezing, try to sneeze with the mouth open to reduce pressure in the nose.   - If using oxygen or CPAP, make sure the device is humidified.      **When to seek medical attention**   - Bleeding does not stop after 20 minutes of firm pressure.   - Bleeding is very heavy or goes down the throat.   - Difficulty breathing, fainting, or signs of shock (pale, weak, confused).   - If packing falls out or causes severe pain, fever, or foul smell.   - Repeated nosebleeds, especially if on blood thinners or with other health problems.      **Follow-up**   - Attend all scheduled follow-up appointments for packing removal and to check  healing.     - If nosebleeds keep happening, further tests may be needed to look for other causes.      Most nosebleeds can be managed at home with pressure, nasal sprays, and good nasal hygiene. Preventing dryness and trauma to the nose, controlling blood pressure, and following instructions for packing are key to reducing future episodes.

## 2024-01-14 NOTE — Progress Notes (Signed)
 Virtual Visit via Telephone Note   Because of Alison Ayala co-morbid illnesses, she is at least at moderate risk for complications without adequate follow up.  This format is felt to be most appropriate for this patient at this time.  Due to technical limitations with video connection (technology), today's appointment will be conducted as an audio only telehealth visit, and Alison Ayala verbally agreed to proceed in this manner.   All issues noted in this document were discussed and addressed.  No physical exam could be performed with this format.  Evaluation Performed:  Preoperative cardiovascular risk assessment _____________   Date:  01/14/2024   Patient ID:  Alison Ayala, DOB Jul 04, 1945, MRN [id number] Patient Location:  Home Provider location:   Office  Primary Care Provider:  Teresa Channel, MD Primary Cardiologist:  Alison DELENA Merck, MD  Chief Complaint / Patient Profile   78 y.o. y/o female with a h/o CAD s/p DES to LAD in 2022, repeat cardiac cath 09/2021 with 20% ostial LCx lesion, widely patent LAD stent, mildly elevated LVEDP, chronic HFpEF, mitral valve regurgitation, aortic valve regurgitation, stable valvular disease on echo 08/17/23, HTN, HLD, nutcracker/jackhammer esophagis, ILD and GERD who is pending right direct brow ptosis repair and presents today for telephonic preoperative cardiovascular risk assessment.  History of Present Illness    Alison Ayala is a 78 y.o. female who presents via audio/video conferencing for a telehealth visit today.  Pt was last seen in cardiology clinic on 08/21/23 by Alison Braver, NP.  At that time Alison Ayala was doing well.  The patient is now pending procedure as outlined above. Since her last visit, she denies chest pain, shortness of breath, lower extremity edema, fatigue, palpitations, melena, hematuria, presyncope, syncope, orthopnea, and PND. Unfortunately, she developed a nosebleed on 01/09/24 for which she was  seen in ED. No further bleeding problems since that time.  She is seeing ENT today. She reports discomfort in her chest with frequent bending over.  She states this may be attributed to history of esophagus problems.  No symptoms concerning for angina.  She is able to achieve > 4 METS activity without concerning cardiac symptoms.   Past Medical History    Past Medical History:  Diagnosis Date   CAD (coronary artery disease)    a. s/p DES to LAD 04/2020.   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HCC)    Diverticulosis    GERD (gastroesophageal reflux disease)    H/O hiatal hernia    H/O intraventricular hemorrhage 2004--  SPONTANEOUS   S/P SHUNT   Heart murmur    Hiatal hernia    Hip pain, right OCCASIONAL -- PINCHED NERVE   History of gastric ulcer 2001   Hyperlipidemia    Hypertension    Hypoglycemia    Internal hemorrhoids    Interstitial lung disease (HCC)    Jackhammer esophagus    Mediastinal mass    Mitral valve prolapse    Normal echocardiogram 30 YRS AGO   NSIP (nonspecific interstitial pneumonia) (HCC)    S/P ventriculoperitoneal shunt 2004--- PER PT SHUNT IS WORKING WELL --  NO ISSUES   SAH (subarachnoid hemorrhage) (HCC)    Squamous cell cancer of scalp and skin of neck    mole back of head   Tubular adenoma of colon    Past Surgical History:  Procedure Laterality Date   BILATERAL FRONTAL INTERVENTRICULAR CATHETER VIA TWIST DRILL HOLE  AUG 2004  SPONTANEOUS SUBARACHNOID  HEMORRHAGE AND  INTRAVENTRICULAR HEMORRHAGE   BIOPSY  06/16/2021   Procedure: BIOPSY;  Surgeon: Albertus Gordy HERO, MD;  Location: [facility];  Service: Gastroenterology;;   CARPAL TUNNEL RELEASE  07/26/2011   Procedure: CARPAL TUNNEL RELEASE;  Surgeon: Lynwood SHAUNNA Bern, MD;  Location: [facility];  Service: Orthopedics;  Laterality: Right;   CATARACT EXTRACTION W/ INTRAOCULAR LENS  IMPLANT, BILATERAL  2012   BILATERAL   COLONOSCOPY WITH PROPOFOL  N/A 06/16/2021    Procedure: COLONOSCOPY WITH PROPOFOL ;  Surgeon: Albertus Gordy HERO, MD;  Location: [facility];  Service: Gastroenterology;  Laterality: N/A;   CORONARY IMAGING/OCT N/A 04/08/2020   Procedure: INTRAVASCULAR IMAGING/OCT;  Surgeon: Verlin Lonni BIRCH, MD;  Location: [facility];  Service: Cardiovascular;  Laterality: N/A;   CORONARY STENT INTERVENTION N/A 04/08/2020   Procedure: CORONARY STENT INTERVENTION;  Surgeon: Verlin Lonni BIRCH, MD;  Location: [facility];  Service: Cardiovascular;  Laterality: N/A;   DUPUYTREN CONTRACTURE RELEASE  07/26/2011   Procedure: DUPUYTREN CONTRACTURE RELEASE;  Surgeon: Lynwood SHAUNNA Bern, MD;  Location: [facility];  Service: Orthopedics;  Laterality: Right;  EXCISION OF DUPYTRENS BAND THUMB- INDEX WEB SPACE   ESOPHAGEAL MANOMETRY N/A 01/27/2019   Procedure: ESOPHAGEAL MANOMETRY (EM);  Surgeon: Albertus Gordy HERO, MD;  Location: [facility];  Service: Gastroenterology;  Laterality: N/A;   ESOPHAGOGASTRODUODENOSCOPY N/A 06/16/2021   Procedure: ESOPHAGOGASTRODUODENOSCOPY (EGD);  Surgeon: Albertus Gordy HERO, MD;  Location: [facility];  Service: Gastroenterology;  Laterality: N/A;   EYE SURGERY Bilateral    scrapped corneas   LEFT HEART CATH AND CORONARY ANGIOGRAPHY N/A 04/08/2020   Procedure: LEFT HEART CATH AND CORONARY ANGIOGRAPHY;  Surgeon: Verlin Lonni BIRCH, MD;  Location: [facility];  Service: Cardiovascular;  Laterality: N/A;   LEFT HEART CATH AND CORONARY ANGIOGRAPHY N/A 10/05/2021   Procedure: LEFT HEART CATH AND CORONARY ANGIOGRAPHY;  Surgeon: Darron Deatrice LABOR, MD;  Location: [facility];  Service: Cardiovascular;  Laterality: N/A;   LUNG BIOPSY Right 08/07/2017   Procedure: Open LUNG BIOPSY;  Surgeon: Fleeta Hanford Coy, MD;  Location[facility];  Service: Thoracic;  Laterality: Right;   RESECTION OF MEDIASTINAL MASS N/A 08/07/2017   Procedure: RESECTION OF MEDIASTINAL MASS;  Surgeon: Fleeta Hanford Coy, MD;  Location: [facility] OR;   Service: Thoracic;  Laterality: N/A;   RETINAL DETACHMENT REPAIR W/ SCLERAL BUCKLE LE  03-10-2011   LEFT EYE   STERNOTOMY N/A 08/07/2017   Procedure: STERNOTOMY;  Surgeon: Fleeta Hanford Coy, MD;  Location: [facility] OR;  Service: Thoracic;  Laterality: N/A;   VENTRICULOPERITONEAL SHUNT  10-08-2002   RIGHT FRONTAL FOR COMMUNICATING HYDROCEPHALUS    Allergies  Allergies  Allergen Reactions   Brilinta  [Ticagrelor ] Shortness Of Breath   Coreg [Carvedilol] Palpitations and Other (See Comments)    Abdominal bloating   Claritin [Loratadine] Other (See Comments)    Blurred vision Dry eyes   Lipitor [Atorvastatin] Other (See Comments)    Fatigue    Lisinopril Other (See Comments)    drunk feeling    Home Medications    Prior to Admission medications   Medication Sig Start Date End Date Taking? Authorizing Provider  acetaminophen  (TYLENOL ) 500 MG tablet Take 1-2 tablets (500-1,000 mg total) by mouth 2 (two) times daily as needed for moderate pain or headache. 08/10/17   Dwan Kyla HERO, PA-C  buPROPion  (WELLBUTRIN  XL) 300 MG 24 hr tablet Take 300 mg by mouth every morning. 12/23/21   [provider]  carboxymethylcellulose (REFRESH PLUS) 0.5 % SOLN Place 1 drop into both eyes in the morning.    [provider]  Cholecalciferol (D3 2000 PO) Take 2,000 mcg by mouth daily.    [provider]  clopidogrel  (PLAVIX ) 75 MG tablet Take 1 tablet (75 mg total) by mouth daily. 11/19/23   Daneen Alison BROCKS, NP  metoprolol  succinate (TOPROL -XL) 100 MG 24 hr tablet Take 1 tablet (100 mg total) by mouth daily. Take with or immediately following a meal. 08/01/21   Nahser, Aleene PARAS, MD  nitroGLYCERIN  (NITROSTAT ) 0.4 MG SL tablet DISSOLVE 1 TABLET UNDER THE TONGUE ONCE FOR EPIGASTRIC PAIN/CHEST PAIN NOT RELIEVED BY PEPPERMINT ALTOIDS. IF THIS DOES NOT RESOLVE PAIN, CALL DR! 11/02/22   Percy Rosaline HERO, NP  ondansetron  (ZOFRAN -ODT) 4 MG disintegrating tablet TAKE 1 TABLET BY MOUTH EVERY 8  HOURS AS NEEDED FOR NAUSEA AND VOMITING 12/22/22   Pyrtle, Gordy HERO, MD  rosuvastatin  (CRESTOR ) 20 MG tablet TAKE 1 TABLET BY MOUTH EVERY DAY 08/09/23   Acharya, Gayatri A, MD  spironolactone  (ALDACTONE ) 25 MG tablet Take 1 tablet (25 mg total) by mouth daily. 07/30/23   Acharya, Gayatri A, MD  valsartan  (DIOVAN ) 320 MG tablet Take 1 tablet (320 mg total) by mouth daily. 05/03/20   Lelon Glendia DASEN, PA-C    Physical Exam    Vital Signs:  Alison Ayala does not have vital signs available for review today.  Given telephonic nature of communication, physical exam is limited. AAOx3. NAD. Normal affect.  Speech and respirations are unlabored.  Accessory Clinical Findings    None  Assessment & Plan    1.  Preoperative Cardiovascular Risk Assessment: According to the Revised Cardiac Risk Index (RCRI), her Perioperative Risk of Major Cardiac Event is (%): 0.9. Her Functional Capacity in METs is: 6.05 according to the Duke Activity Status Index (DASI). The patient is doing well from a cardiac perspective. Therefore, based on ACC/AHA guidelines, the patient would be at acceptable risk for the planned procedure without further cardiovascular testing.   The patient was advised that if she develops new symptoms prior to surgery to contact our office to arrange for a follow-up visit, and she verbalized understanding.  Per office protocol, she may hold Plavix  for 5 days prior to procedure and should resume as soon as hemodynamically stable postoperatively.  A copy of this note will be routed to requesting surgeon.  Time:   Today, I have spent 10 minutes with the patient with telehealth technology discussing medical history, symptoms, and management plan.     Rosaline EMERSON Percy, NP-C  01/14/2024, 8:46 AM [address] [place], [place] [address] Office [phone] Fax [phone]

## 2024-01-14 NOTE — Progress Notes (Signed)
 Dear Dr. Teresa, Here is my assessment for our mutual patient, Alison Ayala. Thank you for allowing me the opportunity to care for your patient. Please do not hesitate to contact me should you have any other questions. Sincerely, Dr. Penne Croak  Otolaryngology Clinic Note Referring provider: Dr. Teresa HPI:  Discussed the use of AI scribe software for clinical note transcription with the patient, who gave verbal consent to proceed.  History of Present Illness Alison Ayala is a 78 year old female with high blood pressure who presents with a recent episode of severe epistaxis.  Epistaxis - First episode of severe epistaxis occurred five days ago, following brushing teeth after a shower. - Bleeding was profuse, requiring multiple washcloths and a towel to manage. - Initial attempt to seek care at urgent care, left after an hour without being seen. - Bleeding recurred at home, prompting emergency room evaluation. - No further episodes of bleeding since initial event. - No use of nasal sprays or ointments since the episode, including those provided by the emergency room.  Nasal congestion and symptoms - Bilateral nasal congestion was present prior to the episode of epistaxis. - Significant reduction in nasal symptoms following the bleeding event. - Currently only requires occasional wiping of the nose, has not blown her nose. - No recent use of nasal sprays or ointments.  Tinnitus and hearing changes - Tinnitus present for approximately one and a half years. - Static noise in the left ear. - Heartbeat-like sound in the right ear. - No significant hearing loss, but acknowledges some changes compared to previous hearing ability.  Hypertension and antiplatelet therapy - Hypertension is reportedly well controlled. - Currently taking Plavix . - Cardiology has cleared her for upcoming brow lift surgery, with instructions to hold Plavix  during the procedure.     Independent  Review of Additional Tests or Records:  Reviewed external note from referring PCP, White,describing relevant history incorporated into today's evaluation. I personally reviewed ED notes from this weekend  PMH/Meds/All/SocHx/FamHx/ROS:   Past Medical History:  Diagnosis Date   CAD (coronary artery disease)    a. s/p DES to LAD 04/2020.   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HCC)    Diverticulosis    GERD (gastroesophageal reflux disease)    H/O hiatal hernia    H/O intraventricular hemorrhage 2004--  SPONTANEOUS   S/P SHUNT   Heart murmur    Hiatal hernia    Hip pain, right OCCASIONAL -- PINCHED NERVE   History of gastric ulcer 2001   Hyperlipidemia    Hypertension    Hypoglycemia    Internal hemorrhoids    Interstitial lung disease (HCC)    Jackhammer esophagus    Mediastinal mass    Mitral valve prolapse    Normal echocardiogram 30 YRS AGO   NSIP (nonspecific interstitial pneumonia) (HCC)    S/P ventriculoperitoneal shunt 2004--- PER PT SHUNT IS WORKING WELL --  NO ISSUES   SAH (subarachnoid hemorrhage) (HCC)    Squamous cell cancer of scalp and skin of neck    mole back of head   Tubular adenoma of colon      Past Surgical History:  Procedure Laterality Date   BILATERAL FRONTAL INTERVENTRICULAR CATHETER VIA TWIST DRILL HOLE  AUG 2004   SPONTANEOUS SUBARACHNOID  HEMORRHAGE AND  INTRAVENTRICULAR HEMORRHAGE   BIOPSY  06/16/2021   Procedure: BIOPSY;  Surgeon: Albertus Gordy HERO, MD;  Location: [facility];  Service: Gastroenterology;;   CARPAL TUNNEL RELEASE  07/26/2011   Procedure: CARPAL TUNNEL RELEASE;  Surgeon: Lynwood SHAUNNA Bern, MD;  Location: [facility];  Service: Orthopedics;  Laterality: Right;   CATARACT EXTRACTION W/ INTRAOCULAR LENS  IMPLANT, BILATERAL  2012   BILATERAL   COLONOSCOPY WITH PROPOFOL  N/A 06/16/2021   Procedure: COLONOSCOPY WITH PROPOFOL ;  Surgeon: Albertus Gordy HERO, MD;  Location: [facility];  Service:  Gastroenterology;  Laterality: N/A;   CORONARY IMAGING/OCT N/A 04/08/2020   Procedure: INTRAVASCULAR IMAGING/OCT;  Surgeon: Verlin Lonni BIRCH, MD;  Location: [facility];  Service: Cardiovascular;  Laterality: N/A;   CORONARY STENT INTERVENTION N/A 04/08/2020   Procedure: CORONARY STENT INTERVENTION;  Surgeon: Verlin Lonni BIRCH, MD;  Location: [facility];  Service: Cardiovascular;  Laterality: N/A;   DUPUYTREN CONTRACTURE RELEASE  07/26/2011   Procedure: DUPUYTREN CONTRACTURE RELEASE;  Surgeon: Lynwood SHAUNNA Bern, MD;  Location: [facility];  Service: Orthopedics;  Laterality: Right;  EXCISION OF DUPYTRENS BAND THUMB- INDEX WEB SPACE   ESOPHAGEAL MANOMETRY N/A 01/27/2019   Procedure: ESOPHAGEAL MANOMETRY (EM);  Surgeon: Albertus Gordy HERO, MD;  Location: [facility];  Service: Gastroenterology;  Laterality: N/A;   ESOPHAGOGASTRODUODENOSCOPY N/A 06/16/2021   Procedure: ESOPHAGOGASTRODUODENOSCOPY (EGD);  Surgeon: Albertus Gordy HERO, MD;  Location: [facility];  Service: Gastroenterology;  Laterality: N/A;   EYE SURGERY Bilateral    scrapped corneas   LEFT HEART CATH AND CORONARY ANGIOGRAPHY N/A 04/08/2020   Procedure: LEFT HEART CATH AND CORONARY ANGIOGRAPHY;  Surgeon: Verlin Lonni BIRCH, MD;  [facility];  Service: Cardiovascular;  Laterality: N/A;   LEFT HEART CATH AND CORONARY ANGIOGRAPHY N/A 10/05/2021   Procedure: LEFT HEART CATH AND CORONARY ANGIOGRAPHY;  Surgeon: Darron Deatrice LABOR, MD;  Location: [facility];  Service: Cardiovascular;  Laterality: N/A;   LUNG BIOPSY Right 08/07/2017   Procedure: Open LUNG BIOPSY;  Surgeon: Fleeta Hanford Coy, MD;  Location[facility];  Service: Thoracic;  Laterality: Right;   RESECTION OF MEDIASTINAL MASS N/A 08/07/2017   Procedure: RESECTION OF MEDIASTINAL MASS;  Surgeon: Fleeta Hanford Coy, MD;  Location: [facility] OR;  Service: Thoracic;  Laterality: N/A;   RETINAL DETACHMENT REPAIR W/ SCLERAL BUCKLE LE  03-10-2011    LEFT EYE   STERNOTOMY N/A 08/07/2017   Procedure: STERNOTOMY;  Surgeon: Fleeta Hanford, Coy, MD;  Location: [facility] OR;  Service: Thoracic;  Laterality: N/A;   VENTRICULOPERITONEAL SHUNT  10-08-2002   RIGHT FRONTAL FOR COMMUNICATING HYDROCEPHALUS    Family History  Problem Relation Age of Onset   Colon cancer Mother 85   Colon polyps Mother    Stomach cancer Maternal Aunt    Breast cancer Maternal Aunt    Uterine cancer Maternal Aunt    Rectal cancer Neg Hx    Esophageal cancer Neg Hx      Social Connections: Not on file      Current Outpatient Medications:    acetaminophen  (TYLENOL ) 500 MG tablet, Take 1-2 tablets (500-1,000 mg total) by mouth 2 (two) times daily as needed for moderate pain or headache., Disp: 30 tablet, Rfl: 0   buPROPion  (WELLBUTRIN  XL) 300 MG 24 hr tablet, Take 300 mg by mouth every morning., Disp: , Rfl:    carboxymethylcellulose (REFRESH PLUS) 0.5 % SOLN, Place 1 drop into both eyes in the morning., Disp: , Rfl:    Cholecalciferol (D3 2000 PO), Take 2,000 mcg by mouth daily., Disp: , Rfl:    clopidogrel  (PLAVIX ) 75 MG tablet, Take 1 tablet (75 mg total) by  mouth daily., Disp: 90 tablet, Rfl: 2   metoprolol  succinate (TOPROL -XL) 100 MG 24 hr tablet, Take 1 tablet (100 mg total) by mouth daily. Take with or immediately following a meal., Disp: 90 tablet, Rfl: 3   nitroGLYCERIN  (NITROSTAT ) 0.4 MG SL tablet, DISSOLVE 1 TABLET UNDER THE TONGUE ONCE FOR EPIGASTRIC PAIN/CHEST PAIN NOT RELIEVED BY PEPPERMINT ALTOIDS. IF THIS DOES NOT RESOLVE PAIN, CALL DR!, Disp: 75 tablet, Rfl: 2   ondansetron  (ZOFRAN -ODT) 4 MG disintegrating tablet, TAKE 1 TABLET BY MOUTH EVERY 8 HOURS AS NEEDED FOR NAUSEA AND VOMITING, Disp: 30 tablet, Rfl: 0   rosuvastatin  (CRESTOR ) 20 MG tablet, TAKE 1 TABLET BY MOUTH EVERY DAY, Disp: 90 tablet, Rfl: 1   spironolactone  (ALDACTONE ) 25 MG tablet, Take 1 tablet (25 mg total) by mouth daily., Disp: 90 tablet, Rfl: 1   valsartan  (DIOVAN ) 320 MG tablet, Take 1  tablet (320 mg total) by mouth daily., Disp: 90 tablet, Rfl: 3   Physical Exam:   BP 137/69   Pulse 68   Temp 98.4 F (36.9 C)   Ht 5' 4 (1.626 m)   Wt 200 lb (90.7 kg)   SpO2 95%   BMI 34.33 kg/m   The patient was awake, alert, and appropriate. The external ears were inspected, and otoscopy was performed to evaluate the external auditory canals and tympanic membranes. The nasal cavity and septum were examined for mucosal changes, obstruction, or discharge. The oral cavity and oropharynx were inspected for mucosal lesions, infection, or tonsillar hypertrophy. The neck was palpated for lymphadenopathy, thyroid  abnormalities, or other masses. Cranial nerve function was grossly intact.  Pertinent Findings: Physical Exam HEENT: Nasal passages normal, no signs of recent bleeding. Oral cavity examined, no abnormalities noted. NECK: Neck examined, no abnormalities noted.  Seprately Identifiable Procedures:  I personally ordered, reviewed and interpreted the following with the patient today  Given the patient's symptoms and incomplete visualization of critical sinonasal areas with anterior rhinoscopy, a separately performed diagnostic nasal endoscopy procedure is indicated for a complete rhinologic evaluation per American Rhinologic Society recommendations (https://www.american-rhinologic.org/position-statements)  I personally ordered, reviewed and interpreted the following with the patient today  Procedure Note Diagnostic Nasal Endoscopy CPT CODE -- 68768 - Mod 25  Prior to initiating any procedures, risks/benefits/alternatives were explained to the patient and verbal consent obtained.  Pre-procedure diagnosis: Concern for mass, epistaxis Post-procedure diagnosis: same Indication: See pre-procedure diagnosis and physical exam above Complications: None apparent EBL: 0 mL Anesthesia: Lidocaine  4% and topical decongestant was topically sprayed in each nasal cavity  Description of  Procedure:  Patient was identified. A flexible fiberoptic endoscope was utilized to evaluate the sinonasal cavities, mucosa, sinus ostia and turbinates and septum.  Overall, signs of mucosal inflammation are not noted.  Also noted are no lesions.  No mucopurulence, polyps, or masses noted.   Right Middle meatus: clear Right SE Recess: clear Left MM: clear Left SE Recess: clear  Impression & Plans:  Alison Ayala is a 78 y.o. female  1. Epistaxis   2. Tinnitus of both ears   3. Anticoagulated   4. Dermatochalasis of right upper eyelid    - Findings and diagnoses discussed in detail with the patient. - Risks, benefits, and alternatives were reviewed. Through shared decision making, the patient elects to proceed with below.  Assessment & Plan Epistaxis Acute left-sided epistaxis likely due to Plavix . Rapid healing observed. Possible nasal growth not visible. - Advised nasal clamp for 15-20 minutes during future episodes. - Recommended Afrin for congestion. -  Instructed to call office if epistaxis recurs. - Consider CT scan if symptoms worsen or recurrence.  Tinnitus, bilateral Bilateral tinnitus with static noise in left ear and pulsatile noise in right ear for 1.5 years. No significant hearing loss. - Schedule hearing test in two months.  - Orders placed:  Orders Placed This Encounter  Procedures   Ambulatory referral to Audiology   - Medications prescribed/continued/adjusted: No orders of the defined types were placed in this encounter.  - Education materials provided to the patient. - Follow up: 8 weeks. Patient instructed to return sooner or go to the ED if new/worsening symptoms develop.   Thank you for allowing me the opportunity to care for your patient. Please do not hesitate to contact me should you have any other questions.  Sincerely, Penne Croak, DO Otolaryngologist (ENT) [facility] Phone: [phone] Fax: [phone]  01/14/2024, 10:02  AM

## 2024-01-24 ENCOUNTER — Telehealth: Payer: Self-pay | Admitting: Internal Medicine

## 2024-01-24 NOTE — Telephone Encounter (Signed)
 Called and spoke to pt. She states that her surgeon's office asked her to get in touch with us  but she agrees to get back in touch with them.   She has not had any bleeding problems or any other issues post surgery. She will get back in touch with us  if needed. No other concerns.

## 2024-01-24 NOTE — Telephone Encounter (Signed)
 Pt c/o medication issue:  1. Name of Medication:   clopidogrel  (PLAVIX ) 75 MG tablet   2. How are you currently taking this medication (dosage and times per day)?   3. Are you having a reaction (difficulty breathing--STAT)?   4. What is your medication issue?   Patient stated she had eye lift surgery on Monday and wants to know if she can re-start this medication.  Patient stated can leave her a voice message.

## 2024-02-18 ENCOUNTER — Ambulatory Visit: Attending: Internal Medicine | Admitting: Internal Medicine

## 2024-02-18 VITALS — BP 138/64 | HR 59 | Ht 63.5 in | Wt 205.2 lb

## 2024-02-18 DIAGNOSIS — Z79899 Other long term (current) drug therapy: Secondary | ICD-10-CM

## 2024-02-18 DIAGNOSIS — I351 Nonrheumatic aortic (valve) insufficiency: Secondary | ICD-10-CM | POA: Diagnosis not present

## 2024-02-18 DIAGNOSIS — I5032 Chronic diastolic (congestive) heart failure: Secondary | ICD-10-CM

## 2024-02-18 DIAGNOSIS — I340 Nonrheumatic mitral (valve) insufficiency: Secondary | ICD-10-CM

## 2024-02-18 MED ORDER — NITROGLYCERIN 0.4 MG SL SUBL: 0.4000 mg | SUBLINGUAL_TABLET | SUBLINGUAL | 3 refills | Status: AC | PRN

## 2024-02-18 NOTE — Patient Instructions (Signed)
 Medication Instructions:  No Changes  Lab Work: None  Testing/Procedures: Your physician has requested that you have an echocardiogram in January 2027 right before returning for your one year follow up visit at [facility]. Echocardiography is a painless test that uses sound waves to create images of your heart. It provides your doctor with information about the size and shape of your heart and how well your hearts chambers and valves are working. This procedure takes approximately one hour. There are no restrictions for this procedure. Please do NOT wear cologne, perfume, aftershave, or lotions (deodorant is allowed). Please arrive 15 minutes prior to your appointment time.  Please note: We ask at that you not bring children with you during ultrasound (echo/ vascular) testing. Due to room size and safety concerns, children are not allowed in the ultrasound rooms during exams. Our front office staff cannot provide observation of children in our lobby area while testing is being conducted. An adult accompanying a patient to their appointment will only be allowed in the ultrasound room at the discretion of the ultrasound technician under special circumstances. We apologize for any inconvenience.   Follow-Up: At [facility], you and your health needs are our priority.  As part of our continuing mission to provide you with exceptional heart care, our providers are all part of one team.  This team includes your primary Cardiologist (physician) and Advanced Practice Providers or APPs (Physician Assistants and Nurse Practitioners) who all work together to provide you with the care you need, when you need it.  Your next appointment:   1 year(s) (AFTER the Echocardiogram/Ultrasound of Heart)  Provider:   Gayatri A Acharya, MD   We recommend signing up for the patient portal called MyChart.  Sign up information is provided on this After Visit Summary.  MyChart is used to connect with patients  for Virtual Visits (Telemedicine).  Patients are able to view lab/test results, encounter notes, upcoming appointments, etc.  Non-urgent messages can be sent to your provider as well.   To learn more about what you can do with MyChart, go to [email or web address].

## 2024-02-18 NOTE — Progress Notes (Signed)
 " Cardiology Office Note:  .   Date:  02/18/2024  ID:  Alison Ayala, DOB [birth year], MRN [id number] PCP: Teresa Channel, MD  [facility] Providers Cardiologist:  Soyla DELENA Merck, MD    History of Present Illness: .   Alison Ayala is a 79 y.o. female.  Discussed the use of AI scribe software for clinical note transcription with the patient, who gave verbal consent to proceed.  History of Present Illness Alison Ayala is a 79 year old female with coronary artery disease and chronic diastolic heart failure who presents for a follow-up visit.  She has CAD with a drug-eluting stent to the LAD in 2022 and takes clopidogrel  75 mg daily. She has had no recent chest pain suggestive of angina and no syncope in the past year. She used nitroglycerin  twice in the last month, mainly for esophageal-type discomfort rather than cardiac pain.  She has chronic diastolic heart failure with associated mitral and aortic regurgitation. Her most recent echocardiogram showed mild to moderate regurgitation of both valves. She has had no ankle swelling or other signs of volume overload.  She has interstitial lung disease with exertional shortness of breath if she moves too quickly. Her last chest imaging about a year ago showed improvement per pulmonology.  She has nutcracker/jackhammer esophagus and GERD, managed by gastroenterology, with esophageal symptoms currently improved.  She recently had a significant episode of epistaxis that required an emergency room visit. ENT evaluation was unrevealing, and she has had no recurrent bleeding or bleeding from other sites. This occurred while she remains on clopidogrel .  Her current cardiac medications are metoprolol  succinate 100 mg daily, valsartan  320 mg daily, spironolactone  25 mg daily, and rosuvastatin  20 mg daily. Her lipid panel from January of last year showed LDL 73, triglycerides 113, HDL 46, and total cholesterol 139.  She feels well  overall.  Problem list: 1) CAD s/p DES to LAD 2022 2) chronic diastolic HF 3) Nutcracker/jackhammer esophagus responsive to SL ntg 4) ILD 5) Mild-moderate AI and MR  ROS: negative except per HPI above.  Studies Reviewed: SABRA   EKG Interpretation Date/Time:  Monday February 18 2024 08:32:05 EST Ventricular Rate:  59 PR Interval:  236 QRS Duration:  90 QT Interval:  400 QTC Calculation: 396 R Axis:   50  Text Interpretation: Sinus bradycardia with 1st degree A-V block When compared with ECG of 25-Jan-2023 08:41, No significant change since last tracing Confirmed by Merck Soyla ([id number]) on 02/18/2024 8:36:20 AM    Results Labs Lipid panel (02/2023): LDL 73, triglycerides 113, HDL 46, cholesterol 139  Diagnostic EKG (02/18/2024): Normal rhythm (Independently interpreted) Echocardiogram (Summer 2025): Mild to moderate mitral valve regurgitation, mild to moderate aortic valve regurgitation, euvolemic Risk Assessment/Calculations:       Physical Exam:   VS:  BP 138/64 (BP Location: Left Arm, Patient Position: Sitting, Cuff Size: Large)   Pulse (!) 59   Ht 5' 3.5 (1.613 m)   Wt 205 lb 3.2 oz (93.1 kg)   SpO2 96%   BMI 35.78 kg/m    Wt Readings from Last 3 Encounters:  02/18/24 205 lb 3.2 oz (93.1 kg)  01/14/24 200 lb (90.7 kg)  01/09/24 200 lb (90.7 kg)     Physical Exam GENERAL: Alert, cooperative, well developed, no acute distress HEENT: Normocephalic, normal oropharynx, moist mucous membranes CHEST: Clear to auscultation bilaterally, no wheezes, rhonchi, or crackles CARDIOVASCULAR: Normal heart rate and rhythm, S1 and S2 normal, 1-2/6 systolic murmur apex,  soft diastolic murmur ABDOMEN: Soft, non-tender, non-distended, without organomegaly, normal bowel sounds EXTREMITIES: No cyanosis or edema NEUROLOGICAL: Cranial nerves grossly intact, moves all extremities without gross motor or sensory deficit   ASSESSMENT AND PLAN: .    Assessment and Plan Assessment &  Plan Chronic heart failure with preserved ejection fraction Euvolemic, no recent exacerbations or fluid overload symptoms. - Continue metoprolol  succinate 100 mg daily, valsartan  320 mg daily, spironolactone  25 mg daily,   Nonrheumatic aortic and mitral valve regurgitation Mild to moderate regurgitation on echocardiogram, stable condition. - Repeat echocardiogram before next annual visit.  Coronary artery disease, status post drug-eluting stent to LAD Post-stent to LAD in 2022, no recent angina, reduced nitroglycerin  use. Recent epistaxis likely due to dry nasal passages and clopidogrel . - Continue clopidogrel  75 mg daily. - Refilled nitroglycerin  prescription for chest pain as needed. - Monitor for recurrent epistaxis, consult ENT if it recurs.  Hypertension - Continue metoprolol  succinate 100 mg daily, valsartan  320 mg daily, spironolactone  25 mg daily,   Hyperlipidemia Cholesterol levels well-controlled, recent labs: LDL 73, triglycerides 113, HDL 46, total cholesterol 860. - Continue Crestor  20 mg daily.       Soyla Merck, MD, [facility] "

## 2024-02-20 ENCOUNTER — Other Ambulatory Visit: Payer: Self-pay | Admitting: Internal Medicine

## 2024-02-28 ENCOUNTER — Ambulatory Visit (INDEPENDENT_AMBULATORY_CARE_PROVIDER_SITE_OTHER)

## 2024-02-28 ENCOUNTER — Ambulatory Visit (INDEPENDENT_AMBULATORY_CARE_PROVIDER_SITE_OTHER): Admitting: Audiology

## 2024-03-19 ENCOUNTER — Ambulatory Visit (INDEPENDENT_AMBULATORY_CARE_PROVIDER_SITE_OTHER): Admitting: Audiology

## 2024-03-19 ENCOUNTER — Ambulatory Visit (INDEPENDENT_AMBULATORY_CARE_PROVIDER_SITE_OTHER)
